# Patient Record
Sex: Female | Born: 1945
Health system: Southern US, Community
[De-identification: ages and names within clinical notes are randomized; demographics above are authoritative.]

## PROBLEM LIST (undated history)

## (undated) DIAGNOSIS — Z9889 Other specified postprocedural states: Secondary | ICD-10-CM

## (undated) DIAGNOSIS — I639 Cerebral infarction, unspecified: Secondary | ICD-10-CM

## (undated) DIAGNOSIS — F319 Bipolar disorder, unspecified: Secondary | ICD-10-CM

## (undated) DIAGNOSIS — F32A Depression, unspecified: Secondary | ICD-10-CM

## (undated) DIAGNOSIS — D649 Anemia, unspecified: Secondary | ICD-10-CM

## (undated) DIAGNOSIS — I809 Phlebitis and thrombophlebitis of unspecified site: Secondary | ICD-10-CM

## (undated) DIAGNOSIS — R112 Nausea with vomiting, unspecified: Secondary | ICD-10-CM

## (undated) DIAGNOSIS — J45909 Unspecified asthma, uncomplicated: Secondary | ICD-10-CM

## (undated) DIAGNOSIS — R42 Dizziness and giddiness: Secondary | ICD-10-CM

## (undated) DIAGNOSIS — K589 Irritable bowel syndrome without diarrhea: Secondary | ICD-10-CM

## (undated) DIAGNOSIS — K449 Diaphragmatic hernia without obstruction or gangrene: Secondary | ICD-10-CM

## (undated) DIAGNOSIS — N200 Calculus of kidney: Secondary | ICD-10-CM

## (undated) DIAGNOSIS — K5792 Diverticulitis of intestine, part unspecified, without perforation or abscess without bleeding: Secondary | ICD-10-CM

## (undated) DIAGNOSIS — M7120 Synovial cyst of popliteal space [Baker], unspecified knee: Secondary | ICD-10-CM

## (undated) DIAGNOSIS — R519 Headache, unspecified: Secondary | ICD-10-CM

## (undated) DIAGNOSIS — R0600 Dyspnea, unspecified: Secondary | ICD-10-CM

## (undated) DIAGNOSIS — F410 Panic disorder [episodic paroxysmal anxiety] without agoraphobia: Secondary | ICD-10-CM

## (undated) DIAGNOSIS — I2510 Atherosclerotic heart disease of native coronary artery without angina pectoris: Secondary | ICD-10-CM

## (undated) DIAGNOSIS — M1990 Unspecified osteoarthritis, unspecified site: Secondary | ICD-10-CM

## (undated) DIAGNOSIS — I10 Essential (primary) hypertension: Secondary | ICD-10-CM

## (undated) DIAGNOSIS — I2699 Other pulmonary embolism without acute cor pulmonale: Secondary | ICD-10-CM

## (undated) DIAGNOSIS — I700 Atherosclerosis of aorta: Secondary | ICD-10-CM

## (undated) DIAGNOSIS — I341 Nonrheumatic mitral (valve) prolapse: Secondary | ICD-10-CM

## (undated) DIAGNOSIS — T8859XA Other complications of anesthesia, initial encounter: Secondary | ICD-10-CM

## (undated) DIAGNOSIS — G4730 Sleep apnea, unspecified: Secondary | ICD-10-CM

## (undated) HISTORY — PX: TONSILLECTOMY: SUR1361

## (undated) HISTORY — PX: KNEE SURGERY: SHX244

## (undated) HISTORY — PX: TOE SURGERY: SHX1073

## (undated) HISTORY — PX: CHOLECYSTECTOMY: SHX55

## (undated) HISTORY — PX: BREAST REDUCTION SURGERY: SHX8

## (undated) HISTORY — PX: JOINT REPLACEMENT: SHX530

## (undated) HISTORY — PX: BACK SURGERY: SHX140

## (undated) HISTORY — PX: BUNIONECTOMY: SHX129

## (undated) HISTORY — PX: ACHILLES TENDON SURGERY: SHX542

## (undated) HISTORY — PX: ABDOMINAL HYSTERECTOMY: SHX81

## (undated) HISTORY — PX: REDUCTION MAMMAPLASTY: SUR839

---

## 1998-10-21 HISTORY — PX: EYE SURGERY: SHX253

## 2004-10-21 DIAGNOSIS — Z87442 Personal history of urinary calculi: Secondary | ICD-10-CM

## 2004-10-21 HISTORY — DX: Personal history of urinary calculi: Z87.442

## 2017-12-12 DIAGNOSIS — D649 Anemia, unspecified: Secondary | ICD-10-CM | POA: Insufficient documentation

## 2019-11-02 DIAGNOSIS — G609 Hereditary and idiopathic neuropathy, unspecified: Secondary | ICD-10-CM | POA: Insufficient documentation

## 2020-11-24 DIAGNOSIS — G4733 Obstructive sleep apnea (adult) (pediatric): Secondary | ICD-10-CM | POA: Diagnosis not present

## 2020-11-28 DIAGNOSIS — E785 Hyperlipidemia, unspecified: Secondary | ICD-10-CM | POA: Diagnosis not present

## 2020-11-28 DIAGNOSIS — E669 Obesity, unspecified: Secondary | ICD-10-CM | POA: Diagnosis not present

## 2020-11-28 DIAGNOSIS — I639 Cerebral infarction, unspecified: Secondary | ICD-10-CM | POA: Diagnosis not present

## 2020-11-28 DIAGNOSIS — G4733 Obstructive sleep apnea (adult) (pediatric): Secondary | ICD-10-CM | POA: Diagnosis not present

## 2020-12-04 DIAGNOSIS — Z20822 Contact with and (suspected) exposure to covid-19: Secondary | ICD-10-CM | POA: Diagnosis not present

## 2020-12-12 DIAGNOSIS — M4712 Other spondylosis with myelopathy, cervical region: Secondary | ICD-10-CM | POA: Diagnosis not present

## 2020-12-12 DIAGNOSIS — M542 Cervicalgia: Secondary | ICD-10-CM | POA: Diagnosis not present

## 2020-12-12 DIAGNOSIS — Z981 Arthrodesis status: Secondary | ICD-10-CM | POA: Diagnosis not present

## 2020-12-12 DIAGNOSIS — R030 Elevated blood-pressure reading, without diagnosis of hypertension: Secondary | ICD-10-CM | POA: Diagnosis not present

## 2020-12-12 DIAGNOSIS — Z6838 Body mass index (BMI) 38.0-38.9, adult: Secondary | ICD-10-CM | POA: Diagnosis not present

## 2020-12-19 DIAGNOSIS — R2689 Other abnormalities of gait and mobility: Secondary | ICD-10-CM | POA: Diagnosis not present

## 2020-12-19 DIAGNOSIS — M4802 Spinal stenosis, cervical region: Secondary | ICD-10-CM | POA: Diagnosis not present

## 2020-12-22 DIAGNOSIS — G4733 Obstructive sleep apnea (adult) (pediatric): Secondary | ICD-10-CM | POA: Diagnosis not present

## 2020-12-25 DIAGNOSIS — R2689 Other abnormalities of gait and mobility: Secondary | ICD-10-CM | POA: Diagnosis not present

## 2020-12-25 DIAGNOSIS — M4802 Spinal stenosis, cervical region: Secondary | ICD-10-CM | POA: Diagnosis not present

## 2020-12-28 DIAGNOSIS — R2689 Other abnormalities of gait and mobility: Secondary | ICD-10-CM | POA: Diagnosis not present

## 2020-12-28 DIAGNOSIS — M4802 Spinal stenosis, cervical region: Secondary | ICD-10-CM | POA: Diagnosis not present

## 2021-01-01 DIAGNOSIS — M4802 Spinal stenosis, cervical region: Secondary | ICD-10-CM | POA: Diagnosis not present

## 2021-01-01 DIAGNOSIS — R2689 Other abnormalities of gait and mobility: Secondary | ICD-10-CM | POA: Diagnosis not present

## 2021-01-03 DIAGNOSIS — R2689 Other abnormalities of gait and mobility: Secondary | ICD-10-CM | POA: Diagnosis not present

## 2021-01-03 DIAGNOSIS — M4802 Spinal stenosis, cervical region: Secondary | ICD-10-CM | POA: Diagnosis not present

## 2021-01-08 DIAGNOSIS — M4802 Spinal stenosis, cervical region: Secondary | ICD-10-CM | POA: Diagnosis not present

## 2021-01-08 DIAGNOSIS — R2689 Other abnormalities of gait and mobility: Secondary | ICD-10-CM | POA: Diagnosis not present

## 2021-01-09 DIAGNOSIS — M4802 Spinal stenosis, cervical region: Secondary | ICD-10-CM | POA: Diagnosis not present

## 2021-01-09 DIAGNOSIS — R2689 Other abnormalities of gait and mobility: Secondary | ICD-10-CM | POA: Diagnosis not present

## 2021-01-17 DIAGNOSIS — M542 Cervicalgia: Secondary | ICD-10-CM | POA: Diagnosis not present

## 2021-01-17 DIAGNOSIS — M4712 Other spondylosis with myelopathy, cervical region: Secondary | ICD-10-CM | POA: Diagnosis not present

## 2021-01-18 DIAGNOSIS — R2689 Other abnormalities of gait and mobility: Secondary | ICD-10-CM | POA: Diagnosis not present

## 2021-01-18 DIAGNOSIS — M4802 Spinal stenosis, cervical region: Secondary | ICD-10-CM | POA: Diagnosis not present

## 2021-01-19 DIAGNOSIS — M4802 Spinal stenosis, cervical region: Secondary | ICD-10-CM | POA: Diagnosis not present

## 2021-01-19 DIAGNOSIS — R2689 Other abnormalities of gait and mobility: Secondary | ICD-10-CM | POA: Diagnosis not present

## 2021-01-21 ENCOUNTER — Emergency Department (HOSPITAL_COMMUNITY)
Admission: EM | Admit: 2021-01-21 | Discharge: 2021-01-21 | Disposition: A | Discharge status: Home / Self Care | Payer: Medicare HMO | Attending: Emergency Medicine | Admitting: Emergency Medicine

## 2021-01-21 ENCOUNTER — Emergency Department (HOSPITAL_COMMUNITY): Payer: Medicare HMO

## 2021-01-21 ENCOUNTER — Other Ambulatory Visit: Payer: Self-pay

## 2021-01-21 ENCOUNTER — Encounter (HOSPITAL_COMMUNITY): Payer: Self-pay | Admitting: Emergency Medicine

## 2021-01-21 DIAGNOSIS — S0990XA Unspecified injury of head, initial encounter: Secondary | ICD-10-CM | POA: Diagnosis not present

## 2021-01-21 DIAGNOSIS — M546 Pain in thoracic spine: Secondary | ICD-10-CM | POA: Diagnosis not present

## 2021-01-21 DIAGNOSIS — G44319 Acute post-traumatic headache, not intractable: Secondary | ICD-10-CM

## 2021-01-21 DIAGNOSIS — M549 Dorsalgia, unspecified: Secondary | ICD-10-CM

## 2021-01-21 DIAGNOSIS — S52502A Unspecified fracture of the lower end of left radius, initial encounter for closed fracture: Secondary | ICD-10-CM | POA: Diagnosis not present

## 2021-01-21 DIAGNOSIS — W1839XA Other fall on same level, initial encounter: Secondary | ICD-10-CM | POA: Diagnosis not present

## 2021-01-21 DIAGNOSIS — M25522 Pain in left elbow: Secondary | ICD-10-CM | POA: Insufficient documentation

## 2021-01-21 DIAGNOSIS — Z043 Encounter for examination and observation following other accident: Secondary | ICD-10-CM | POA: Diagnosis not present

## 2021-01-21 DIAGNOSIS — M545 Low back pain, unspecified: Secondary | ICD-10-CM | POA: Diagnosis not present

## 2021-01-21 DIAGNOSIS — R519 Headache, unspecified: Secondary | ICD-10-CM | POA: Diagnosis not present

## 2021-01-21 DIAGNOSIS — J45909 Unspecified asthma, uncomplicated: Secondary | ICD-10-CM | POA: Diagnosis not present

## 2021-01-21 DIAGNOSIS — W19XXXA Unspecified fall, initial encounter: Secondary | ICD-10-CM

## 2021-01-21 HISTORY — DX: Synovial cyst of popliteal space (Baker), unspecified knee: M71.20

## 2021-01-21 HISTORY — DX: Diverticulitis of intestine, part unspecified, without perforation or abscess without bleeding: K57.92

## 2021-01-21 HISTORY — DX: Diaphragmatic hernia without obstruction or gangrene: K44.9

## 2021-01-21 HISTORY — DX: Other pulmonary embolism without acute cor pulmonale: I26.99

## 2021-01-21 HISTORY — DX: Panic disorder (episodic paroxysmal anxiety): F41.0

## 2021-01-21 HISTORY — DX: Nonrheumatic mitral (valve) prolapse: I34.1

## 2021-01-21 HISTORY — DX: Calculus of kidney: N20.0

## 2021-01-21 HISTORY — DX: Cerebral infarction, unspecified: I63.9

## 2021-01-21 HISTORY — DX: Dizziness and giddiness: R42

## 2021-01-21 HISTORY — DX: Phlebitis and thrombophlebitis of unspecified site: I80.9

## 2021-01-21 HISTORY — DX: Anemia, unspecified: D64.9

## 2021-01-21 HISTORY — DX: Unspecified asthma, uncomplicated: J45.909

## 2021-01-21 HISTORY — DX: Irritable bowel syndrome without diarrhea: K58.9

## 2021-01-21 MED ORDER — HYDROCODONE-ACETAMINOPHEN 5-325 MG PO TABS
1.0000 | ORAL_TABLET | Freq: Once | ORAL | Status: AC
  Administered 2021-01-21: 1 via ORAL
  Filled 2021-01-21: qty 1

## 2021-01-21 MED ORDER — HYDROCODONE-ACETAMINOPHEN 5-325 MG PO TABS: 1.0000 | ORAL_TABLET | ORAL | 0 refills | Status: DC | PRN

## 2021-01-21 NOTE — ED Notes (Signed)
Patient verbalized understanding of discharge instructions. Opportunity for questions and answers.  

## 2021-01-21 NOTE — ED Triage Notes (Signed)
Pt got her feet tangled in blanket around 4am and fell.  C/o pain to L hand, R lower back, and back of head.  Denies LOC.  No blood thinners.

## 2021-01-21 NOTE — ED Provider Notes (Signed)
Patient placed in Quick Look pathway, seen and evaluated   Chief Complaint: fall  HPI:   75 year old female presents after mechanical fall, he got tangled up in a blanket and she fell catching herself on her outstretched right hand and wrist where she is having pain.  Also reports pain in the right low back and pain over the back of her head and neck where she hit it on a table.  Aspirin but no other anticoagulants  ROS: + Headache, wrist pain, back pain  -Vomiting numbness, weakness  Physical Exam:   Gen: No distress  Neuro: Awake and Alert  Skin: Warm    Focused Exam: Tenderness to palpation over the back of the head with small healing, midline C-spine tenderness as well as some pain in the right low back musculature, pain and swelling noted over the left wrist   Initiation of care has begun. The patient has been counseled on the process, plan, and necessity for staying for the completion/evaluation, and the remainder of the medical screening examination MSE was initiated and I personally evaluated the patient and placed orders (if any) at  2:34 PM on January 21, 2021.  The patient appears stable so that the remainder of the MSE may be completed by another provider.   Dartha Lodge, PA-C 01/21/21 1442    Koleen Distance, MD 01/21/21 [phone]

## 2021-01-21 NOTE — ED Notes (Signed)
Patient transported to X-ray 

## 2021-01-25 DIAGNOSIS — R2689 Other abnormalities of gait and mobility: Secondary | ICD-10-CM | POA: Diagnosis not present

## 2021-01-25 DIAGNOSIS — M4802 Spinal stenosis, cervical region: Secondary | ICD-10-CM | POA: Diagnosis not present

## 2021-01-26 DIAGNOSIS — M4802 Spinal stenosis, cervical region: Secondary | ICD-10-CM | POA: Diagnosis not present

## 2021-01-26 DIAGNOSIS — R2689 Other abnormalities of gait and mobility: Secondary | ICD-10-CM | POA: Diagnosis not present

## 2021-01-30 DIAGNOSIS — M4712 Other spondylosis with myelopathy, cervical region: Secondary | ICD-10-CM | POA: Diagnosis not present

## 2021-01-30 DIAGNOSIS — R2689 Other abnormalities of gait and mobility: Secondary | ICD-10-CM | POA: Diagnosis not present

## 2021-01-30 DIAGNOSIS — M4802 Spinal stenosis, cervical region: Secondary | ICD-10-CM | POA: Diagnosis not present

## 2021-01-30 NOTE — ED Provider Notes (Signed)
[facility] EMERGENCY DEPARTMENT Provider Note   CSN: [id number] Arrival date & time: 01/21/21  1355     History Chief Complaint  Patient presents with  . Fall    Courtney [name] is a 75 y.o. female.  HPI     75yo female with history below presents with concern for fall with left wrist/hand pain and back pain.  She was tangled up in a blanket last night causing her to fall.  She hit the back of her head and reports headache. No LOC.  Pain is moderate, severe with movements. She is on aspirin but no other anticoagulation. Denies other recent symptoms or concerns. No n/v/numbness/weakness.       Past Medical History:  Diagnosis Date  . Anemia   . Asthma   . Baker's cyst of knee   . Diverticulitis   . Hiatal hernia   . Kidney stone   . Mitral valve prolapse   . Panic attack   . Phlebitis   . Pulmonary embolus (HCC)   . Spastic colon   . Stroke (HCC)   . Vertigo     There are no problems to display for this patient.   Past Surgical History:  Procedure Laterality Date  . ABDOMINAL HYSTERECTOMY    . BACK SURGERY    . BREAST REDUCTION SURGERY    . CHOLECYSTECTOMY    . KNEE SURGERY    . KNEE SURGERY    . TOE SURGERY    . TONSILLECTOMY       OB History   No obstetric history on file.     No family history on file.  Social History   Tobacco Use  . Smoking status: Never Smoker  . Smokeless tobacco: Never Used  Substance Use Topics  . Alcohol use: Not Currently  . Drug use: Not Currently    Home Medications Prior to Admission medications   Medication Sig Start Date End Date Taking? Authorizing Provider  HYDROcodone-acetaminophen (NORCO/VICODIN) 5-325 MG tablet Take 1 tablet by mouth every 4 (four) hours as needed. 01/21/21  Yes Alvira Monday, MD    Allergies    Patient has no allergy information on record.  Review of Systems   Review of Systems  Constitutional: Negative for fever.  Respiratory: Negative for cough and  shortness of breath.   Cardiovascular: Negative for chest pain.  Gastrointestinal: Negative for nausea and vomiting.  Genitourinary: Negative for difficulty urinating.  Musculoskeletal: Positive for arthralgias and back pain.  Skin: Negative for wound.  Neurological: Positive for headaches. Negative for syncope, weakness and numbness.    Physical Exam Updated Vital Signs BP 133/67   Pulse 66   Temp 98.6 F (37 C)   Resp 17   SpO2 98%   Physical Exam Vitals and nursing note reviewed.  Constitutional:      General: She is not in acute distress.    Appearance: Normal appearance. She is not ill-appearing, toxic-appearing or diaphoretic.  HENT:     Head: Normocephalic.  Eyes:     Conjunctiva/sclera: Conjunctivae normal.  Cardiovascular:     Rate and Rhythm: Normal rate and regular rhythm.     Pulses: Normal pulses.  Pulmonary:     Effort: Pulmonary effort is normal. No respiratory distress.  Musculoskeletal:        General: No deformity or signs of injury.     Cervical back: No rigidity.     Comments: Tenderness C, T, L spine Tenderness distal radius Normal pulses, opponens/finger  abduction, sensation Elbow tenderness  Skin:    General: Skin is warm and dry.     Coloration: Skin is not jaundiced or pale.  Neurological:     General: No focal deficit present.     Mental Status: She is alert and oriented to person, place, and time.     ED Results / Procedures / Treatments   Labs (all labs ordered are listed, but only abnormal results are displayed) Labs Reviewed - No data to display  EKG None  Radiology No results found.  Procedures Procedures   Medications Ordered in ED Medications  HYDROcodone-acetaminophen (NORCO/VICODIN) 5-325 MG per tablet 1 tablet (1 tablet Oral Given 01/21/21 2201)    ED Course  I have reviewed the triage vital signs and the nursing notes.  Pertinent labs & imaging results that were available during my care of the patient were  reviewed by me and considered in my medical decision making (see chart for details).    MDM Rules/Calculators/A&P                          75yo female with history below presents with concern for fall with left wrist/hand pain and back pain.  Reports mechanical fall with no other acute medical concerns.    CT head and CSpine without acute injuries.  XR T, L spine, elbow without acute abnormalities.  XR wrist shows fracture of distal left radius.  Reports she is claustrophobic and cannot tolerate a splint and will cut it off. Discussed this situation with Dr. Eulah Pont and agreed to use velcro splint. Discussed pain control and reviewed in [place] drug database. Patient discharged in stable condition with understanding of reasons to return.    Final Clinical Impression(s) / ED Diagnoses Final diagnoses:  Fall, initial encounter  Acute post-traumatic headache, not intractable  Acute midline back pain, unspecified back location  Closed fracture of distal end of left radius, unspecified fracture morphology, initial encounter    Rx / DC Orders ED Discharge Orders         Ordered    HYDROcodone-acetaminophen (NORCO/VICODIN) 5-325 MG tablet  Every 4 hours PRN        01/21/21 2230           Alvira Monday, MD 01/30/21 [id number]

## 2021-01-31 DIAGNOSIS — S52552D Other extraarticular fracture of lower end of left radius, subsequent encounter for closed fracture with routine healing: Secondary | ICD-10-CM | POA: Diagnosis not present

## 2021-02-01 DIAGNOSIS — M4802 Spinal stenosis, cervical region: Secondary | ICD-10-CM | POA: Diagnosis not present

## 2021-02-01 DIAGNOSIS — R2689 Other abnormalities of gait and mobility: Secondary | ICD-10-CM | POA: Diagnosis not present

## 2021-02-06 DIAGNOSIS — R2689 Other abnormalities of gait and mobility: Secondary | ICD-10-CM | POA: Diagnosis not present

## 2021-02-06 DIAGNOSIS — M4802 Spinal stenosis, cervical region: Secondary | ICD-10-CM | POA: Diagnosis not present

## 2021-02-09 DIAGNOSIS — R2689 Other abnormalities of gait and mobility: Secondary | ICD-10-CM | POA: Diagnosis not present

## 2021-02-09 DIAGNOSIS — M4802 Spinal stenosis, cervical region: Secondary | ICD-10-CM | POA: Diagnosis not present

## 2021-02-12 DIAGNOSIS — R2689 Other abnormalities of gait and mobility: Secondary | ICD-10-CM | POA: Diagnosis not present

## 2021-02-12 DIAGNOSIS — M4802 Spinal stenosis, cervical region: Secondary | ICD-10-CM | POA: Diagnosis not present

## 2021-02-15 ENCOUNTER — Other Ambulatory Visit: Payer: Self-pay | Admitting: Neurosurgery

## 2021-02-18 ENCOUNTER — Emergency Department (HOSPITAL_COMMUNITY): Payer: Medicare HMO

## 2021-02-18 ENCOUNTER — Encounter (HOSPITAL_COMMUNITY): Payer: Self-pay

## 2021-02-18 ENCOUNTER — Other Ambulatory Visit: Payer: Self-pay

## 2021-02-18 ENCOUNTER — Emergency Department (HOSPITAL_COMMUNITY)
Admission: EM | Admit: 2021-02-18 | Discharge: 2021-02-18 | Disposition: A | Discharge status: Home / Self Care | Payer: Medicare HMO | Attending: Emergency Medicine | Admitting: Emergency Medicine

## 2021-02-18 DIAGNOSIS — S0990XA Unspecified injury of head, initial encounter: Secondary | ICD-10-CM | POA: Diagnosis not present

## 2021-02-18 DIAGNOSIS — Z043 Encounter for examination and observation following other accident: Secondary | ICD-10-CM | POA: Diagnosis not present

## 2021-02-18 DIAGNOSIS — W01198A Fall on same level from slipping, tripping and stumbling with subsequent striking against other object, initial encounter: Secondary | ICD-10-CM | POA: Insufficient documentation

## 2021-02-18 DIAGNOSIS — J45909 Unspecified asthma, uncomplicated: Secondary | ICD-10-CM | POA: Diagnosis not present

## 2021-02-18 DIAGNOSIS — R9431 Abnormal electrocardiogram [ECG] [EKG]: Secondary | ICD-10-CM | POA: Diagnosis not present

## 2021-02-18 DIAGNOSIS — Y92002 Bathroom of unspecified non-institutional (private) residence single-family (private) house as the place of occurrence of the external cause: Secondary | ICD-10-CM | POA: Insufficient documentation

## 2021-02-18 DIAGNOSIS — I959 Hypotension, unspecified: Secondary | ICD-10-CM | POA: Diagnosis not present

## 2021-02-18 DIAGNOSIS — W19XXXA Unspecified fall, initial encounter: Secondary | ICD-10-CM | POA: Diagnosis not present

## 2021-02-18 DIAGNOSIS — M47812 Spondylosis without myelopathy or radiculopathy, cervical region: Secondary | ICD-10-CM | POA: Diagnosis not present

## 2021-02-18 DIAGNOSIS — G4489 Other headache syndrome: Secondary | ICD-10-CM | POA: Diagnosis not present

## 2021-02-18 DIAGNOSIS — M542 Cervicalgia: Secondary | ICD-10-CM | POA: Diagnosis not present

## 2021-02-18 MED ORDER — ACETAMINOPHEN 325 MG PO TABS
650.0000 mg | ORAL_TABLET | Freq: Once | ORAL | Status: AC
  Administered 2021-02-18: 650 mg via ORAL
  Filled 2021-02-18: qty 2

## 2021-02-18 NOTE — ED Notes (Signed)
Patient transported to CT 

## 2021-02-18 NOTE — ED Triage Notes (Signed)
States has neck surgery scheduled for June 8, neck was not hurting but now it is since fall this morning. Not sure if LOC.  States her knees gave out, EMS states 1in hematoma to back of head, no lacerations, c./o head and neck pain, mae

## 2021-02-18 NOTE — Discharge Instructions (Addendum)
Please return for any problem.  °

## 2021-02-18 NOTE — ED Provider Notes (Signed)
[facility] EMERGENCY DEPARTMENT Provider Note   CSN: [id number] Arrival date & time: 02/18/21  0920     History Chief Complaint  Patient presents with  . Fall  . Headache  . Neck Injury    States has neck surgery scheduled for June 8, neck was not hurting but now it is since fall this morning. Not sure if LOC.  States her knees gave out EMS states has about one inch hematoma to back of head    Courtney [name] is a 75 y.o. female.  75 year old female with prior medical history as detailed below presents for evaluation.  Patient reports that she had a mechanical fall in her bathroom this morning.  She was bending over to take care of her cats.  She lost her balance and fell forward.  She struck her head against the floor.  She did not pass out.  She complains of chronic neck and back pain.  She denies new acute neck pain.  She denies other injury related to her fall.  She was on the floor for roughly 5 to 10 minutes until family helped her up.  She denies other injury.  She was ambulatory after the fall.  The history is provided by the patient and medical records.  Head Injury Location:  Frontal Time since incident:  1 hour Mechanism of injury: fall   Fall:    Point of impact:  Head   Entrapped after fall: no   Pain details:    Quality:  Aching   Severity:  Mild   Progression:  Waxing and waning Chronicity:  New Relieved by:  Nothing Worsened by:  Nothing      Past Medical History:  Diagnosis Date  . Anemia   . Asthma   . Baker's cyst of knee   . Diverticulitis   . Hiatal hernia   . Kidney stone   . Mitral valve prolapse   . Panic attack   . Phlebitis   . Pulmonary embolus (HCC)   . Spastic colon   . Stroke (HCC)   . Vertigo     There are no problems to display for this patient.   Past Surgical History:  Procedure Laterality Date  . ABDOMINAL HYSTERECTOMY    . BACK SURGERY    . BREAST REDUCTION SURGERY    . CHOLECYSTECTOMY    . KNEE  SURGERY    . KNEE SURGERY    . TOE SURGERY    . TONSILLECTOMY       OB History   No obstetric history on file.     History reviewed. No pertinent family history.  Social History   Tobacco Use  . Smoking status: Never Smoker  . Smokeless tobacco: Never Used  Substance Use Topics  . Alcohol use: Not Currently  . Drug use: Not Currently    Home Medications Prior to Admission medications   Medication Sig Start Date End Date Taking? Authorizing Provider  HYDROcodone-acetaminophen (NORCO/VICODIN) 5-325 MG tablet Take 1 tablet by mouth every 4 (four) hours as needed. 01/21/21   Alvira Monday, MD    Allergies    Meloxicam, Phentermine, Sulfa antibiotics, Tetanus-diphtheria toxoids td, and Zithromax [azithromycin]  Review of Systems   Review of Systems  All other systems reviewed and are negative.   Physical Exam Updated Vital Signs BP (!) 185/100 (BP Location: Right Arm)   Pulse 73 Comment: NSR  Temp 97.8 F (36.6 C) (Oral)   Resp 18   Ht 5'  2" (1.575 m)   Wt 99.8 kg   SpO2 100%   BMI 40.24 kg/m   Physical Exam Vitals and nursing note reviewed.  Constitutional:      General: She is not in acute distress.    Appearance: She is well-developed.  HENT:     Head: Normocephalic and atraumatic.  Eyes:     General: No visual field deficit.    Conjunctiva/sclera: Conjunctivae normal.     Pupils: Pupils are equal, round, and reactive to light.  Cardiovascular:     Rate and Rhythm: Normal rate and regular rhythm.     Heart sounds: Normal heart sounds.  Pulmonary:     Effort: Pulmonary effort is normal. No respiratory distress.     Breath sounds: Normal breath sounds.  Abdominal:     General: There is no distension.     Palpations: Abdomen is soft.     Tenderness: There is no abdominal tenderness.  Musculoskeletal:        General: No deformity. Normal range of motion.     Cervical back: Normal range of motion and neck supple.  Skin:    General: Skin is warm and  dry.  Neurological:     Mental Status: She is alert and oriented to person, place, and time. Mental status is at baseline.     GCS: GCS eye subscore is 4. GCS verbal subscore is 5. GCS motor subscore is 6.     Cranial Nerves: No cranial nerve deficit, dysarthria or facial asymmetry.     Sensory: No sensory deficit.     Motor: No weakness.     Coordination: Romberg sign negative. Coordination normal.     ED Results / Procedures / Treatments   Labs (all labs ordered are listed, but only abnormal results are displayed) Labs Reviewed - No data to display  EKG EKG Interpretation  Date/Time:  Sunday Feb 18 2021 09:31:45 EDT Ventricular Rate:  72 PR Interval:  166 QRS Duration: 105 QT Interval:  440 QTC Calculation: 482 R Axis:   45 Text Interpretation: Sinus rhythm Abnormal R-wave progression, early transition Confirmed by Kristine Royal [phone]) on 02/18/2021 9:34:47 AM   Radiology CT Head Wo Contrast  Result Date: 02/18/2021 CLINICAL DATA:  Fall EXAM: CT HEAD WITHOUT CONTRAST TECHNIQUE: Contiguous axial images were obtained from the base of the skull through the vertex without intravenous contrast. COMPARISON:  01/21/2021 FINDINGS: Brain: There is no acute intracranial hemorrhage, mass effect, or edema. No new loss of gray differentiation. Patchy and confluent areas of hypoattenuation in the supratentorial white matter likely reflects stable chronic microvascular ischemic changes. Chronic infarct of the right corona radiata/lentiform nucleus. There is no extra-axial fluid collection. Prominence of the ventricles and sulci reflects stable parenchymal volume loss. Vascular: There is atherosclerotic calcification at the skull base. Skull: Calvarium is unremarkable. Sinuses/Orbits: No acute finding. Other: None. IMPRESSION: No evidence of acute intracranial injury. No significant change since recent prior study. Electronically Signed   By: Guadlupe Spanish M.D.   On: 02/18/2021 10:48   CT  Cervical Spine Wo Contrast  Result Date: 02/18/2021 CLINICAL DATA:  Fall EXAM: CT CERVICAL SPINE WITHOUT CONTRAST TECHNIQUE: Multidetector CT imaging of the cervical spine was performed without intravenous contrast. Multiplanar CT image reconstructions were also generated. COMPARISON:  01/21/2021 FINDINGS: Alignment: Stable. Skull base and vertebrae: Stable vertebral body heights. No acute cervical spine fracture. Soft tissues and spinal canal: No prevertebral fluid or swelling. No visible canal hematoma. Disc levels: Multilevel degenerative changes are  present including disc space narrowing, endplate osteophytes, and facet and uncovertebral hypertrophy. Stable appearance compared to recent prior study. Changes remain greatest at C3-C4, C4-C5, and C5-C6. Upper chest: No acute abnormality. Other: None. IMPRESSION: No acute cervical spine fracture. Electronically Signed   By: Guadlupe Spanish M.D.   On: 02/18/2021 10:51    Procedures Procedures   Medications Ordered in ED Medications  acetaminophen (TYLENOL) tablet 650 mg (650 mg Oral Given 02/18/21 0943)    ED Course  I have reviewed the triage vital signs and the nursing notes.  Pertinent labs & imaging results that were available during my care of the patient were reviewed by me and considered in my medical decision making (see chart for details).    MDM Rules/Calculators/A&P                          MDM  MSE complete  CARMINE YOUNGBERG was evaluated in Emergency Department on 02/18/2021 for the symptoms described in the history of present illness. She was evaluated in the context of the global COVID-19 pandemic, which necessitated consideration that the patient might be at risk for infection with the SARS-CoV-2 virus that causes COVID-19. Institutional protocols and algorithms that pertain to the evaluation of patients at risk for COVID-19 are in a state of rapid change based on information released by regulatory bodies including the CDC and  federal and state organizations. These policies and algorithms were followed during the patient's care in the ED.  Patient is presenting for evaluation following reported fall at home.  She did strike her head.  She did not experience LOC.  No other injury reported.  No other injury found on exam.  CT imaging is without significant acute traumatic injury.  Patient feels improved after administration of Tylenol.  She now desires discharge home.  She does use Tylenol and Ultram at home as needed for pain.  She and her husband at bedside understand need for close follow-up.  Strict return precautions given and understood.  Final Clinical Impression(s) / ED Diagnoses Final diagnoses:  Fall, initial encounter  Injury of head, initial encounter    Rx / DC Orders ED Discharge Orders    None       Wynetta Fines, MD 02/18/21 1101

## 2021-02-20 DIAGNOSIS — S52552D Other extraarticular fracture of lower end of left radius, subsequent encounter for closed fracture with routine healing: Secondary | ICD-10-CM | POA: Diagnosis not present

## 2021-02-20 DIAGNOSIS — M25532 Pain in left wrist: Secondary | ICD-10-CM | POA: Diagnosis not present

## 2021-02-22 ENCOUNTER — Other Ambulatory Visit: Payer: Self-pay | Admitting: Internal Medicine

## 2021-02-22 DIAGNOSIS — F411 Generalized anxiety disorder: Secondary | ICD-10-CM | POA: Diagnosis not present

## 2021-02-22 DIAGNOSIS — M859 Disorder of bone density and structure, unspecified: Secondary | ICD-10-CM | POA: Diagnosis not present

## 2021-02-22 DIAGNOSIS — M1990 Unspecified osteoarthritis, unspecified site: Secondary | ICD-10-CM | POA: Diagnosis not present

## 2021-02-22 DIAGNOSIS — I639 Cerebral infarction, unspecified: Secondary | ICD-10-CM | POA: Diagnosis not present

## 2021-02-22 DIAGNOSIS — M8580 Other specified disorders of bone density and structure, unspecified site: Secondary | ICD-10-CM

## 2021-02-22 DIAGNOSIS — G629 Polyneuropathy, unspecified: Secondary | ICD-10-CM | POA: Diagnosis not present

## 2021-02-22 DIAGNOSIS — E785 Hyperlipidemia, unspecified: Secondary | ICD-10-CM | POA: Diagnosis not present

## 2021-02-22 DIAGNOSIS — Z1231 Encounter for screening mammogram for malignant neoplasm of breast: Secondary | ICD-10-CM

## 2021-02-22 DIAGNOSIS — I10 Essential (primary) hypertension: Secondary | ICD-10-CM | POA: Diagnosis not present

## 2021-02-22 DIAGNOSIS — K219 Gastro-esophageal reflux disease without esophagitis: Secondary | ICD-10-CM | POA: Diagnosis not present

## 2021-02-22 DIAGNOSIS — E039 Hypothyroidism, unspecified: Secondary | ICD-10-CM | POA: Diagnosis not present

## 2021-02-22 DIAGNOSIS — F331 Major depressive disorder, recurrent, moderate: Secondary | ICD-10-CM | POA: Diagnosis not present

## 2021-02-22 DIAGNOSIS — R2689 Other abnormalities of gait and mobility: Secondary | ICD-10-CM | POA: Diagnosis not present

## 2021-02-22 DIAGNOSIS — M4802 Spinal stenosis, cervical region: Secondary | ICD-10-CM | POA: Diagnosis not present

## 2021-02-28 DIAGNOSIS — M4802 Spinal stenosis, cervical region: Secondary | ICD-10-CM | POA: Diagnosis not present

## 2021-02-28 DIAGNOSIS — G4733 Obstructive sleep apnea (adult) (pediatric): Secondary | ICD-10-CM | POA: Diagnosis not present

## 2021-02-28 DIAGNOSIS — R2689 Other abnormalities of gait and mobility: Secondary | ICD-10-CM | POA: Diagnosis not present

## 2021-03-02 DIAGNOSIS — R110 Nausea: Secondary | ICD-10-CM | POA: Diagnosis not present

## 2021-03-02 DIAGNOSIS — R519 Headache, unspecified: Secondary | ICD-10-CM | POA: Diagnosis not present

## 2021-03-13 DIAGNOSIS — Z1212 Encounter for screening for malignant neoplasm of rectum: Secondary | ICD-10-CM | POA: Diagnosis not present

## 2021-03-13 DIAGNOSIS — Z1211 Encounter for screening for malignant neoplasm of colon: Secondary | ICD-10-CM | POA: Diagnosis not present

## 2021-03-20 DIAGNOSIS — S52552D Other extraarticular fracture of lower end of left radius, subsequent encounter for closed fracture with routine healing: Secondary | ICD-10-CM | POA: Diagnosis not present

## 2021-03-21 LAB — COLOGUARD: COLOGUARD: POSITIVE — AB

## 2021-03-26 ENCOUNTER — Encounter (HOSPITAL_COMMUNITY): Payer: Self-pay

## 2021-03-26 ENCOUNTER — Encounter (HOSPITAL_COMMUNITY)
Admission: RE | Admit: 2021-03-26 | Discharge: 2021-03-26 | Disposition: A | Discharge status: Home / Self Care | Payer: Medicare HMO | Source: Ambulatory Visit | Attending: Neurosurgery | Admitting: Neurosurgery

## 2021-03-26 ENCOUNTER — Other Ambulatory Visit: Payer: Self-pay

## 2021-03-26 DIAGNOSIS — E039 Hypothyroidism, unspecified: Secondary | ICD-10-CM | POA: Diagnosis present

## 2021-03-26 DIAGNOSIS — M4722 Other spondylosis with radiculopathy, cervical region: Secondary | ICD-10-CM | POA: Diagnosis present

## 2021-03-26 DIAGNOSIS — K5900 Constipation, unspecified: Secondary | ICD-10-CM | POA: Diagnosis not present

## 2021-03-26 DIAGNOSIS — M4802 Spinal stenosis, cervical region: Secondary | ICD-10-CM | POA: Diagnosis present

## 2021-03-26 DIAGNOSIS — Z981 Arthrodesis status: Secondary | ICD-10-CM | POA: Diagnosis not present

## 2021-03-26 DIAGNOSIS — R2681 Unsteadiness on feet: Secondary | ICD-10-CM | POA: Diagnosis present

## 2021-03-26 DIAGNOSIS — M4712 Other spondylosis with myelopathy, cervical region: Secondary | ICD-10-CM | POA: Diagnosis present

## 2021-03-26 DIAGNOSIS — M4322 Fusion of spine, cervical region: Secondary | ICD-10-CM | POA: Diagnosis not present

## 2021-03-26 DIAGNOSIS — I10 Essential (primary) hypertension: Secondary | ICD-10-CM | POA: Diagnosis present

## 2021-03-26 DIAGNOSIS — F319 Bipolar disorder, unspecified: Secondary | ICD-10-CM | POA: Diagnosis present

## 2021-03-26 DIAGNOSIS — J986 Disorders of diaphragm: Secondary | ICD-10-CM | POA: Diagnosis present

## 2021-03-26 DIAGNOSIS — R1313 Dysphagia, pharyngeal phase: Secondary | ICD-10-CM | POA: Diagnosis present

## 2021-03-26 DIAGNOSIS — R062 Wheezing: Secondary | ICD-10-CM | POA: Diagnosis not present

## 2021-03-26 DIAGNOSIS — Z743 Need for continuous supervision: Secondary | ICD-10-CM | POA: Diagnosis not present

## 2021-03-26 DIAGNOSIS — Z01812 Encounter for preprocedural laboratory examination: Secondary | ICD-10-CM | POA: Insufficient documentation

## 2021-03-26 DIAGNOSIS — I517 Cardiomegaly: Secondary | ICD-10-CM | POA: Diagnosis not present

## 2021-03-26 DIAGNOSIS — M19032 Primary osteoarthritis, left wrist: Secondary | ICD-10-CM | POA: Diagnosis present

## 2021-03-26 DIAGNOSIS — I341 Nonrheumatic mitral (valve) prolapse: Secondary | ICD-10-CM | POA: Diagnosis present

## 2021-03-26 DIAGNOSIS — R0603 Acute respiratory distress: Secondary | ICD-10-CM | POA: Diagnosis not present

## 2021-03-26 DIAGNOSIS — R710 Precipitous drop in hematocrit: Secondary | ICD-10-CM | POA: Diagnosis not present

## 2021-03-26 DIAGNOSIS — Z6841 Body Mass Index (BMI) 40.0 and over, adult: Secondary | ICD-10-CM | POA: Diagnosis not present

## 2021-03-26 DIAGNOSIS — E871 Hypo-osmolality and hyponatremia: Secondary | ICD-10-CM | POA: Diagnosis present

## 2021-03-26 DIAGNOSIS — R0602 Shortness of breath: Secondary | ICD-10-CM | POA: Diagnosis not present

## 2021-03-26 DIAGNOSIS — J449 Chronic obstructive pulmonary disease, unspecified: Secondary | ICD-10-CM | POA: Diagnosis present

## 2021-03-26 DIAGNOSIS — R531 Weakness: Secondary | ICD-10-CM | POA: Diagnosis not present

## 2021-03-26 DIAGNOSIS — R339 Retention of urine, unspecified: Secondary | ICD-10-CM | POA: Diagnosis not present

## 2021-03-26 DIAGNOSIS — E669 Obesity, unspecified: Secondary | ICD-10-CM | POA: Diagnosis present

## 2021-03-26 DIAGNOSIS — R061 Stridor: Secondary | ICD-10-CM | POA: Diagnosis not present

## 2021-03-26 DIAGNOSIS — Z20822 Contact with and (suspected) exposure to covid-19: Secondary | ICD-10-CM | POA: Diagnosis present

## 2021-03-26 DIAGNOSIS — G4733 Obstructive sleep apnea (adult) (pediatric): Secondary | ICD-10-CM | POA: Diagnosis present

## 2021-03-26 DIAGNOSIS — E785 Hyperlipidemia, unspecified: Secondary | ICD-10-CM | POA: Diagnosis present

## 2021-03-26 DIAGNOSIS — J690 Pneumonitis due to inhalation of food and vomit: Secondary | ICD-10-CM | POA: Diagnosis not present

## 2021-03-26 DIAGNOSIS — R451 Restlessness and agitation: Secondary | ICD-10-CM | POA: Diagnosis not present

## 2021-03-26 DIAGNOSIS — I69398 Other sequelae of cerebral infarction: Secondary | ICD-10-CM | POA: Diagnosis not present

## 2021-03-26 DIAGNOSIS — R221 Localized swelling, mass and lump, neck: Secondary | ICD-10-CM | POA: Diagnosis not present

## 2021-03-26 HISTORY — DX: Unspecified osteoarthritis, unspecified site: M19.90

## 2021-03-26 HISTORY — DX: Nausea with vomiting, unspecified: R11.2

## 2021-03-26 HISTORY — DX: Sleep apnea, unspecified: G47.30

## 2021-03-26 HISTORY — DX: Other complications of anesthesia, initial encounter: T88.59XA

## 2021-03-26 HISTORY — DX: Headache, unspecified: R51.9

## 2021-03-26 HISTORY — DX: Bipolar disorder, unspecified: F31.9

## 2021-03-26 HISTORY — DX: Other specified postprocedural states: Z98.890

## 2021-03-26 HISTORY — DX: Essential (primary) hypertension: I10

## 2021-03-26 HISTORY — DX: Dyspnea, unspecified: R06.00

## 2021-03-26 LAB — TYPE AND SCREEN
ABO/RH(D): O POS
Antibody Screen: NEGATIVE

## 2021-03-26 LAB — CBC
HCT: 42.5 % (ref 36.0–46.0)
Hemoglobin: 13.1 g/dL (ref 12.0–15.0)
MCH: 28.7 pg (ref 26.0–34.0)
MCHC: 30.8 g/dL (ref 30.0–36.0)
MCV: 93.2 fL (ref 80.0–100.0)
Platelets: 244 10*3/uL (ref 150–400)
RBC: 4.56 MIL/uL (ref 3.87–5.11)
RDW: 12.8 % (ref 11.5–15.5)
WBC: 6.9 10*3/uL (ref 4.0–10.5)
nRBC: 0 % (ref 0.0–0.2)

## 2021-03-26 LAB — BASIC METABOLIC PANEL
Anion gap: 10 (ref 5–15)
BUN: 10 mg/dL (ref 8–23)
CO2: 25 mmol/L (ref 22–32)
Calcium: 9.2 mg/dL (ref 8.9–10.3)
Chloride: 99 mmol/L (ref 98–111)
Creatinine, Ser: 0.8 mg/dL (ref 0.44–1.00)
GFR, Estimated: >60 mL/min (ref >60)
Glucose, Bld: 93 mg/dL (ref 70–99)
Potassium: 4.4 mmol/L (ref 3.5–5.1)
Sodium: 134 mmol/L — ABNORMAL LOW (ref 135–145)

## 2021-03-26 NOTE — Pre-Procedure Instructions (Signed)
Surgical Instructions    Your procedure is scheduled on Wednesday 03/28/21.  Report to [facility] Main Entrance "A" at 06:30 A.M., then check in with the Admitting office.  Call this number if you have problems the morning of surgery:  [phone]   If you have any questions prior to your surgery date call [phone]: Open Monday-Friday 8am-4pm    Remember:  Do not eat or drink after midnight the night before your surgery     Take these medicines the morning of surgery with A SIP OF WATER   albuterol (VENTOLIN HFA) 108 (90 Base)- If needed  atorvastatin (LIPITOR)  buPROPion (WELLBUTRIN XL)   docusate sodium (COLACE)  escitalopram (LEXAPRO)  FLOVENT HFA 44 MCG/ACT   gabapentin (NEURONTIN)  levothyroxine (SYNTHROID)   LORazepam (ATIVAN)  montelukast (SINGULAIR)  pantoprazole (PROTONIX)  promethazine (PHENERGAN)- If needed  solifenacin (VESICARE)  verapamil (CALAN-SR)    As of today, STOP taking any Aspirin (unless otherwise instructed by your surgeon) Aleve, Naproxen, Ibuprofen, Motrin, Advil, Goody's, BC's, all herbal medications, fish oil, and all vitamins.          Do not wear jewelry or makeup Do not wear lotions, powders, perfumes/colognes, or deodorant. Do not shave 48 hours prior to surgery.  Men may shave face and neck. Do not bring valuables to the hospital. DO Not wear nail polish, gel polish, artificial nails, or any other type of covering on  natural nails including finger and toenails. If patients have artificial nails, gel coating, etc. that need to be removed by a nail salon please have this removed prior to surgery or surgery may need to be canceled/delayed if the surgeon/ anesthesia feels like the patient is unable to be adequately monitored.             [facility] is not responsible for any belongings or valuables.  Do NOT Smoke (Tobacco/Vaping) or drink Alcohol 24 hours prior to your procedure If you use a CPAP at night, you may bring all equipment for  your overnight stay.   Contacts, glasses, dentures or bridgework may not be worn into surgery, please bring cases for these belongings   For patients admitted to the hospital, discharge time will be determined by your treatment team.   Patients discharged the day of surgery will not be allowed to drive home, and someone needs to stay with them for 24 hours.    Special instructions:    Oral Hygiene is also important to reduce your risk of infection.  Remember - BRUSH YOUR TEETH THE MORNING OF SURGERY WITH YOUR REGULAR TOOTHPASTE   [place]- Preparing For Surgery  Before surgery, you can play an important role. Because skin is not sterile, your skin needs to be as free of germs as possible. You can reduce the number of germs on your skin by washing with CHG (chlorahexidine gluconate) Soap before surgery.  CHG is an antiseptic cleaner which kills germs and bonds with the skin to continue killing germs even after washing.     Please do not use if you have an allergy to CHG or antibacterial soaps. If your skin becomes reddened/irritated stop using the CHG.  Do not shave (including legs and underarms) for at least 48 hours prior to first CHG shower. It is OK to shave your face.  Please follow these instructions carefully.    1.  Shower the NIGHT BEFORE SURGERY and the MORNING OF SURGERY with CHG Soap.   If you chose to wash your hair,  wash your hair first as usual with your normal shampoo. After you shampoo, rinse your hair and body thoroughly to remove the shampoo.  Then [employer] and genitals (private parts) with your normal soap and rinse thoroughly to remove soap.  2. After that Use CHG Soap as you would any other liquid soap. You can apply CHG directly to the skin and wash gently with a scrungie or a clean washcloth.   3. Apply the CHG Soap to your body ONLY FROM THE NECK DOWN.  Do not use on open wounds or open sores. Avoid contact with your eyes, ears, mouth and genitals (private  parts). Wash Face and genitals (private parts)  with your normal soap.   4. Wash thoroughly, paying special attention to the area where your surgery will be performed.  5. Thoroughly rinse your body with warm water from the neck down.  6. DO NOT shower/wash with your normal soap after using and rinsing off the CHG Soap.  7. Pat yourself dry with a CLEAN TOWEL.  8. Wear CLEAN PAJAMAS to bed the night before surgery  9. Place CLEAN SHEETS on your bed the night before your surgery  10. DO NOT SLEEP WITH PETS.   Day of Surgery:  Take a shower with CHG soap. Wear Clean/Comfortable clothing the morning of surgery Do not apply any deodorants/lotions.   Remember to brush your teeth WITH YOUR REGULAR TOOTHPASTE.   Please read over the following fact sheets that you were given.

## 2021-03-26 NOTE — Progress Notes (Addendum)
PCP - Dr. Tyson Dense Cardiologist - denies  PPM/ICD - n/a Device Orders - n/a Rep Notified - n/a  Chest x-ray - n/a EKG - 02/18/21 Stress Test - denies ECHO-denies Cardiac Cath - denies  Sleep Study - Yes. Several years ago in [place], [place]. Patient cannot remember name of provider CPAP - Yes. Wears nightly.   Fasting Blood Sugar - n/a  Aspirin Instructions: Patient states she stopped her aspirin as instructed by her doctor 5 days ago.  COVID TEST- 03/26/21. Pending   Anesthesia review: No  Patient denies shortness of breath, fever, cough and chest pain at PAT appointment   All instructions explained to the patient, with a verbal understanding of the material. Patient agrees to go over the instructions while at home for a better understanding. Patient also instructed to self quarantine after being tested for COVID-19. The opportunity to ask questions was provided.

## 2021-03-27 LAB — SURGICAL PCR SCREEN
MRSA, PCR: POSITIVE — AB
Staphylococcus aureus: POSITIVE — AB

## 2021-03-27 LAB — SARS CORONAVIRUS 2 (TAT 6-24 HRS): SARS Coronavirus 2: NEGATIVE

## 2021-03-27 MED ORDER — VANCOMYCIN HCL 1500 MG/300ML IV SOLN
1500.0000 mg | Freq: Once | INTRAVENOUS | Status: AC
  Administered 2021-03-28: 1500 mg via INTRAVENOUS
  Filled 2021-03-27: qty 300

## 2021-03-27 NOTE — Progress Notes (Signed)
Microbiology called to report a positive MRSA PCR.  Patient will receive betadine DOS.     Left message for [facility] at Dr. Lovell Sheehan' office.

## 2021-03-28 ENCOUNTER — Inpatient Hospital Stay (HOSPITAL_COMMUNITY): Payer: Medicare HMO

## 2021-03-28 ENCOUNTER — Encounter (HOSPITAL_COMMUNITY)
Admission: RE | Disposition: A | Discharge status: Skilled Nursing Facility | Payer: Self-pay | Source: Home / Self Care | Attending: Neurosurgery

## 2021-03-28 ENCOUNTER — Encounter (HOSPITAL_COMMUNITY): Payer: Self-pay | Admitting: Neurosurgery

## 2021-03-28 ENCOUNTER — Inpatient Hospital Stay (HOSPITAL_COMMUNITY)
Admission: RE | Admit: 2021-03-28 | Discharge: 2021-04-06 | DRG: 471 | Disposition: A | Discharge status: Skilled Nursing Facility | Payer: Medicare HMO | Attending: Neurosurgery | Admitting: Neurosurgery

## 2021-03-28 DIAGNOSIS — M4722 Other spondylosis with radiculopathy, cervical region: Secondary | ICD-10-CM | POA: Diagnosis not present

## 2021-03-28 DIAGNOSIS — R710 Precipitous drop in hematocrit: Secondary | ICD-10-CM | POA: Diagnosis not present

## 2021-03-28 DIAGNOSIS — Z981 Arthrodesis status: Secondary | ICD-10-CM | POA: Diagnosis not present

## 2021-03-28 DIAGNOSIS — Z20822 Contact with and (suspected) exposure to covid-19: Secondary | ICD-10-CM | POA: Diagnosis present

## 2021-03-28 DIAGNOSIS — R0602 Shortness of breath: Secondary | ICD-10-CM | POA: Diagnosis not present

## 2021-03-28 DIAGNOSIS — F319 Bipolar disorder, unspecified: Secondary | ICD-10-CM | POA: Diagnosis present

## 2021-03-28 DIAGNOSIS — M4712 Other spondylosis with myelopathy, cervical region: Secondary | ICD-10-CM | POA: Diagnosis present

## 2021-03-28 DIAGNOSIS — I10 Essential (primary) hypertension: Secondary | ICD-10-CM | POA: Diagnosis present

## 2021-03-28 DIAGNOSIS — Z887 Allergy status to serum and vaccine status: Secondary | ICD-10-CM

## 2021-03-28 DIAGNOSIS — Z79890 Hormone replacement therapy (postmenopausal): Secondary | ICD-10-CM

## 2021-03-28 DIAGNOSIS — Z90710 Acquired absence of both cervix and uterus: Secondary | ICD-10-CM

## 2021-03-28 DIAGNOSIS — E039 Hypothyroidism, unspecified: Secondary | ICD-10-CM

## 2021-03-28 DIAGNOSIS — M19032 Primary osteoarthritis, left wrist: Secondary | ICD-10-CM | POA: Diagnosis present

## 2021-03-28 DIAGNOSIS — I341 Nonrheumatic mitral (valve) prolapse: Secondary | ICD-10-CM | POA: Diagnosis present

## 2021-03-28 DIAGNOSIS — Z743 Need for continuous supervision: Secondary | ICD-10-CM | POA: Diagnosis not present

## 2021-03-28 DIAGNOSIS — R062 Wheezing: Secondary | ICD-10-CM | POA: Diagnosis not present

## 2021-03-28 DIAGNOSIS — Z87442 Personal history of urinary calculi: Secondary | ICD-10-CM

## 2021-03-28 DIAGNOSIS — J690 Pneumonitis due to inhalation of food and vomit: Secondary | ICD-10-CM | POA: Diagnosis present

## 2021-03-28 DIAGNOSIS — R2681 Unsteadiness on feet: Secondary | ICD-10-CM | POA: Diagnosis present

## 2021-03-28 DIAGNOSIS — R339 Retention of urine, unspecified: Secondary | ICD-10-CM | POA: Diagnosis not present

## 2021-03-28 DIAGNOSIS — R221 Localized swelling, mass and lump, neck: Secondary | ICD-10-CM | POA: Diagnosis not present

## 2021-03-28 DIAGNOSIS — E785 Hyperlipidemia, unspecified: Secondary | ICD-10-CM | POA: Diagnosis present

## 2021-03-28 DIAGNOSIS — Z881 Allergy status to other antibiotic agents status: Secondary | ICD-10-CM

## 2021-03-28 DIAGNOSIS — R451 Restlessness and agitation: Secondary | ICD-10-CM | POA: Diagnosis not present

## 2021-03-28 DIAGNOSIS — I69398 Other sequelae of cerebral infarction: Secondary | ICD-10-CM | POA: Diagnosis not present

## 2021-03-28 DIAGNOSIS — J986 Disorders of diaphragm: Secondary | ICD-10-CM | POA: Diagnosis present

## 2021-03-28 DIAGNOSIS — E669 Obesity, unspecified: Secondary | ICD-10-CM | POA: Diagnosis not present

## 2021-03-28 DIAGNOSIS — M19031 Primary osteoarthritis, right wrist: Secondary | ICD-10-CM | POA: Diagnosis present

## 2021-03-28 DIAGNOSIS — R061 Stridor: Secondary | ICD-10-CM | POA: Diagnosis not present

## 2021-03-28 DIAGNOSIS — Z882 Allergy status to sulfonamides status: Secondary | ICD-10-CM

## 2021-03-28 DIAGNOSIS — Z79899 Other long term (current) drug therapy: Secondary | ICD-10-CM

## 2021-03-28 DIAGNOSIS — E871 Hypo-osmolality and hyponatremia: Secondary | ICD-10-CM | POA: Diagnosis present

## 2021-03-28 DIAGNOSIS — R0603 Acute respiratory distress: Secondary | ICD-10-CM | POA: Diagnosis not present

## 2021-03-28 DIAGNOSIS — Z7982 Long term (current) use of aspirin: Secondary | ICD-10-CM

## 2021-03-28 DIAGNOSIS — M4322 Fusion of spine, cervical region: Secondary | ICD-10-CM | POA: Diagnosis not present

## 2021-03-28 DIAGNOSIS — R1313 Dysphagia, pharyngeal phase: Secondary | ICD-10-CM | POA: Diagnosis present

## 2021-03-28 DIAGNOSIS — Z6841 Body Mass Index (BMI) 40.0 and over, adult: Secondary | ICD-10-CM | POA: Diagnosis not present

## 2021-03-28 DIAGNOSIS — R531 Weakness: Secondary | ICD-10-CM | POA: Diagnosis not present

## 2021-03-28 DIAGNOSIS — G4733 Obstructive sleep apnea (adult) (pediatric): Secondary | ICD-10-CM | POA: Diagnosis not present

## 2021-03-28 DIAGNOSIS — Z886 Allergy status to analgesic agent status: Secondary | ICD-10-CM

## 2021-03-28 DIAGNOSIS — Z8672 Personal history of thrombophlebitis: Secondary | ICD-10-CM

## 2021-03-28 DIAGNOSIS — J189 Pneumonia, unspecified organism: Secondary | ICD-10-CM

## 2021-03-28 DIAGNOSIS — Z86711 Personal history of pulmonary embolism: Secondary | ICD-10-CM

## 2021-03-28 DIAGNOSIS — J449 Chronic obstructive pulmonary disease, unspecified: Secondary | ICD-10-CM | POA: Diagnosis present

## 2021-03-28 DIAGNOSIS — Z419 Encounter for procedure for purposes other than remedying health state, unspecified: Secondary | ICD-10-CM

## 2021-03-28 DIAGNOSIS — M4802 Spinal stenosis, cervical region: Secondary | ICD-10-CM | POA: Diagnosis present

## 2021-03-28 DIAGNOSIS — Z96653 Presence of artificial knee joint, bilateral: Secondary | ICD-10-CM | POA: Diagnosis present

## 2021-03-28 DIAGNOSIS — I517 Cardiomegaly: Secondary | ICD-10-CM | POA: Diagnosis not present

## 2021-03-28 HISTORY — PX: ANTERIOR CERVICAL DECOMP/DISCECTOMY FUSION: SHX1161

## 2021-03-28 LAB — ABO/RH: ABO/RH(D): O POS

## 2021-03-28 SURGERY — ANTERIOR CERVICAL DECOMPRESSION/DISCECTOMY FUSION 3 LEVELS
Anesthesia: General | Site: Spine Cervical

## 2021-03-28 MED ORDER — MONTELUKAST SODIUM 10 MG PO TABS
10.0000 mg | ORAL_TABLET | Freq: Every day | ORAL | Status: DC
  Administered 2021-03-29 – 2021-04-06 (×8): 10 mg via ORAL
  Filled 2021-03-28 (×8): qty 1

## 2021-03-28 MED ORDER — LIDOCAINE 2% (20 MG/ML) 5 ML SYRINGE
INTRAMUSCULAR | Status: AC
  Filled 2021-03-28: qty 5

## 2021-03-28 MED ORDER — ACETAMINOPHEN 160 MG/5ML PO SOLN: 325.0000 mg | Freq: Once | ORAL | Status: DC | PRN

## 2021-03-28 MED ORDER — PROMETHAZINE HCL 25 MG/ML IJ SOLN: 6.2500 mg | INTRAMUSCULAR | Status: DC | PRN

## 2021-03-28 MED ORDER — ACETAMINOPHEN 325 MG PO TABS: 325.0000 mg | ORAL_TABLET | Freq: Once | ORAL | Status: DC | PRN

## 2021-03-28 MED ORDER — PHENOL 1.4 % MT LIQD: 1.0000 | OROMUCOSAL | Status: DC | PRN

## 2021-03-28 MED ORDER — HEMOSTATIC AGENTS (NO CHARGE) OPTIME
TOPICAL | Status: DC | PRN
  Administered 2021-03-28: 1 via TOPICAL

## 2021-03-28 MED ORDER — SUCCINYLCHOLINE CHLORIDE 200 MG/10ML IV SOSY
PREFILLED_SYRINGE | INTRAVENOUS | Status: DC | PRN
  Administered 2021-03-28: 120 mg via INTRAVENOUS

## 2021-03-28 MED ORDER — DEXAMETHASONE SODIUM PHOSPHATE 10 MG/ML IJ SOLN
INTRAMUSCULAR | Status: AC
  Filled 2021-03-28: qty 1

## 2021-03-28 MED ORDER — ONDANSETRON HCL 4 MG PO TABS
4.0000 mg | ORAL_TABLET | Freq: Four times a day (QID) | ORAL | Status: DC | PRN
  Administered 2021-03-30: 4 mg via ORAL
  Filled 2021-03-28: qty 1

## 2021-03-28 MED ORDER — VERAPAMIL HCL ER 180 MG PO TBCR
360.0000 mg | EXTENDED_RELEASE_TABLET | Freq: Every day | ORAL | Status: DC
  Administered 2021-03-29 – 2021-04-06 (×8): 360 mg via ORAL
  Filled 2021-03-28 (×10): qty 2

## 2021-03-28 MED ORDER — GABAPENTIN 100 MG PO CAPS
100.0000 mg | ORAL_CAPSULE | Freq: Three times a day (TID) | ORAL | Status: DC
  Administered 2021-03-28 – 2021-04-06 (×23): 100 mg via ORAL
  Filled 2021-03-28 (×24): qty 1

## 2021-03-28 MED ORDER — MIDAZOLAM HCL 2 MG/2ML IJ SOLN
INTRAMUSCULAR | Status: AC
  Filled 2021-03-28: qty 2

## 2021-03-28 MED ORDER — ACETAMINOPHEN 325 MG PO TABS
650.0000 mg | ORAL_TABLET | ORAL | Status: DC | PRN
  Administered 2021-03-29 – 2021-04-05 (×6): 650 mg via ORAL
  Filled 2021-03-28 (×6): qty 2

## 2021-03-28 MED ORDER — ROCURONIUM BROMIDE 10 MG/ML (PF) SYRINGE
PREFILLED_SYRINGE | INTRAVENOUS | Status: DC | PRN
  Administered 2021-03-28 (×2): 20 mg via INTRAVENOUS
  Administered 2021-03-28: 10 mg via INTRAVENOUS
  Administered 2021-03-28: 50 mg via INTRAVENOUS

## 2021-03-28 MED ORDER — FENTANYL CITRATE (PF) 250 MCG/5ML IJ SOLN
INTRAMUSCULAR | Status: AC
  Filled 2021-03-28: qty 5

## 2021-03-28 MED ORDER — MIDAZOLAM HCL 5 MG/5ML IJ SOLN
INTRAMUSCULAR | Status: DC | PRN
  Administered 2021-03-28: 2 mg via INTRAVENOUS

## 2021-03-28 MED ORDER — ONDANSETRON HCL 4 MG/2ML IJ SOLN
INTRAMUSCULAR | Status: DC | PRN
  Administered 2021-03-28 (×2): 4 mg via INTRAVENOUS

## 2021-03-28 MED ORDER — LACTATED RINGERS IV SOLN: INTRAVENOUS | Status: DC

## 2021-03-28 MED ORDER — ONDANSETRON HCL 4 MG/2ML IJ SOLN
INTRAMUSCULAR | Status: AC
  Filled 2021-03-28: qty 2

## 2021-03-28 MED ORDER — BALANCED SALT IO SOLN
15.0000 mL | Freq: Once | INTRAOCULAR | Status: DC
  Filled 2021-03-28: qty 15

## 2021-03-28 MED ORDER — MECLIZINE HCL 25 MG PO TABS
25.0000 mg | ORAL_TABLET | Freq: Three times a day (TID) | ORAL | Status: DC | PRN
  Filled 2021-03-28: qty 1

## 2021-03-28 MED ORDER — 0.9 % SODIUM CHLORIDE (POUR BTL) OPTIME
TOPICAL | Status: DC | PRN
  Administered 2021-03-28: 1000 mL

## 2021-03-28 MED ORDER — DEXAMETHASONE 4 MG PO TABS
4.0000 mg | ORAL_TABLET | Freq: Four times a day (QID) | ORAL | Status: AC
  Administered 2021-03-28: 4 mg via ORAL
  Filled 2021-03-28: qty 1

## 2021-03-28 MED ORDER — CHLORHEXIDINE GLUCONATE 0.12 % MT SOLN
15.0000 mL | Freq: Once | OROMUCOSAL | Status: AC
  Administered 2021-03-28: 15 mL via OROMUCOSAL
  Filled 2021-03-28: qty 15

## 2021-03-28 MED ORDER — ZOLPIDEM TARTRATE 5 MG PO TABS: 5.0000 mg | ORAL_TABLET | Freq: Every evening | ORAL | Status: DC | PRN

## 2021-03-28 MED ORDER — BUPROPION HCL ER (XL) 150 MG PO TB24
150.0000 mg | ORAL_TABLET | Freq: Every day | ORAL | Status: DC
  Administered 2021-03-29 – 2021-04-06 (×8): 150 mg via ORAL
  Filled 2021-03-28 (×8): qty 1

## 2021-03-28 MED ORDER — ONDANSETRON HCL 4 MG/2ML IJ SOLN
4.0000 mg | Freq: Four times a day (QID) | INTRAMUSCULAR | Status: DC | PRN
  Administered 2021-03-29 (×3): 4 mg via INTRAVENOUS
  Filled 2021-03-28 (×4): qty 2

## 2021-03-28 MED ORDER — THROMBIN 5000 UNITS EX SOLR
CUTANEOUS | Status: AC
  Filled 2021-03-28: qty 5000

## 2021-03-28 MED ORDER — PROPOFOL 10 MG/ML IV BOLUS
INTRAVENOUS | Status: AC
  Filled 2021-03-28: qty 20

## 2021-03-28 MED ORDER — CHLORHEXIDINE GLUCONATE CLOTH 2 % EX PADS: 6.0000 | MEDICATED_PAD | Freq: Once | CUTANEOUS | Status: DC

## 2021-03-28 MED ORDER — BUPIVACAINE-EPINEPHRINE (PF) 0.5% -1:200000 IJ SOLN
INTRAMUSCULAR | Status: DC | PRN
  Administered 2021-03-28: 10 mL

## 2021-03-28 MED ORDER — BACITRACIN ZINC 500 UNIT/GM EX OINT
TOPICAL_OINTMENT | CUTANEOUS | Status: AC
  Filled 2021-03-28: qty 28.35

## 2021-03-28 MED ORDER — ALBUTEROL SULFATE (2.5 MG/3ML) 0.083% IN NEBU
3.0000 mL | INHALATION_SOLUTION | Freq: Four times a day (QID) | RESPIRATORY_TRACT | Status: DC | PRN
  Administered 2021-03-28 – 2021-03-31 (×4): 3 mL via RESPIRATORY_TRACT
  Filled 2021-03-28 (×4): qty 3

## 2021-03-28 MED ORDER — BUPIVACAINE-EPINEPHRINE 0.5% -1:200000 IJ SOLN
INTRAMUSCULAR | Status: AC
  Filled 2021-03-28: qty 1

## 2021-03-28 MED ORDER — HYDROCODONE-ACETAMINOPHEN 5-325 MG PO TABS
1.0000 | ORAL_TABLET | ORAL | Status: DC | PRN
  Administered 2021-03-31: 1 via ORAL
  Administered 2021-04-02 – 2021-04-05 (×4): 2 via ORAL
  Filled 2021-03-28: qty 2
  Filled 2021-03-28: qty 1
  Filled 2021-03-28 (×3): qty 2

## 2021-03-28 MED ORDER — PSYLLIUM 95 % PO PACK
1.0000 | PACK | Freq: Every day | ORAL | Status: DC
  Administered 2021-03-29 – 2021-04-04 (×6): 1 via ORAL
  Filled 2021-03-28 (×9): qty 1

## 2021-03-28 MED ORDER — MAGNESIUM OXIDE -MG SUPPLEMENT 400 (240 MG) MG PO TABS
400.0000 mg | ORAL_TABLET | Freq: Every day | ORAL | Status: DC
  Administered 2021-03-29 – 2021-04-06 (×8): 400 mg via ORAL
  Filled 2021-03-28 (×8): qty 1

## 2021-03-28 MED ORDER — ORAL CARE MOUTH RINSE: 15.0000 mL | Freq: Once | OROMUCOSAL | Status: AC

## 2021-03-28 MED ORDER — IRBESARTAN 150 MG PO TABS
300.0000 mg | ORAL_TABLET | Freq: Every day | ORAL | Status: DC
  Administered 2021-03-29 – 2021-03-30 (×2): 300 mg via ORAL
  Filled 2021-03-28 (×2): qty 2

## 2021-03-28 MED ORDER — CYCLOBENZAPRINE HCL 10 MG PO TABS
10.0000 mg | ORAL_TABLET | Freq: Three times a day (TID) | ORAL | Status: DC | PRN
  Administered 2021-03-28 – 2021-04-05 (×8): 10 mg via ORAL
  Filled 2021-03-28 (×9): qty 1

## 2021-03-28 MED ORDER — THROMBIN 5000 UNITS EX SOLR
OROMUCOSAL | Status: DC | PRN
  Administered 2021-03-28 (×2): 5 mL via TOPICAL

## 2021-03-28 MED ORDER — SUCCINYLCHOLINE CHLORIDE 200 MG/10ML IV SOSY
PREFILLED_SYRINGE | INTRAVENOUS | Status: AC
  Filled 2021-03-28: qty 10

## 2021-03-28 MED ORDER — KETAMINE HCL 10 MG/ML IJ SOLN
INTRAMUSCULAR | Status: DC | PRN
  Administered 2021-03-28: 15 mg via INTRAVENOUS
  Administered 2021-03-28: 10 mg via INTRAVENOUS

## 2021-03-28 MED ORDER — PROMETHAZINE HCL 25 MG PO TABS
25.0000 mg | ORAL_TABLET | Freq: Four times a day (QID) | ORAL | Status: DC | PRN
  Administered 2021-03-30: 25 mg via ORAL
  Filled 2021-03-28 (×2): qty 1

## 2021-03-28 MED ORDER — DEXAMETHASONE SODIUM PHOSPHATE 4 MG/ML IJ SOLN
4.0000 mg | Freq: Four times a day (QID) | INTRAMUSCULAR | Status: AC
  Administered 2021-03-28: 4 mg via INTRAVENOUS
  Filled 2021-03-28: qty 1

## 2021-03-28 MED ORDER — LEVOTHYROXINE SODIUM 50 MCG PO TABS
50.0000 ug | ORAL_TABLET | Freq: Every day | ORAL | Status: DC
  Administered 2021-03-29 – 2021-04-06 (×8): 50 ug via ORAL
  Filled 2021-03-28 (×4): qty 1
  Filled 2021-03-28: qty 2
  Filled 2021-03-28 (×3): qty 1

## 2021-03-28 MED ORDER — KETAMINE HCL 50 MG/5ML IJ SOSY
PREFILLED_SYRINGE | INTRAMUSCULAR | Status: AC
  Filled 2021-03-28: qty 5

## 2021-03-28 MED ORDER — ROCURONIUM BROMIDE 10 MG/ML (PF) SYRINGE
PREFILLED_SYRINGE | INTRAVENOUS | Status: AC
  Filled 2021-03-28: qty 10

## 2021-03-28 MED ORDER — ACETAMINOPHEN 650 MG RE SUPP: 650.0000 mg | RECTAL | Status: DC | PRN

## 2021-03-28 MED ORDER — MEPERIDINE HCL 25 MG/ML IJ SOLN: 6.2500 mg | INTRAMUSCULAR | Status: DC | PRN

## 2021-03-28 MED ORDER — HYDROMORPHONE HCL 1 MG/ML IJ SOLN
0.2500 mg | INTRAMUSCULAR | Status: DC | PRN
  Administered 2021-03-28 (×2): 0.25 mg via INTRAVENOUS

## 2021-03-28 MED ORDER — HYDROMORPHONE HCL 1 MG/ML IJ SOLN
INTRAMUSCULAR | Status: AC
  Filled 2021-03-28: qty 1

## 2021-03-28 MED ORDER — PHENYLEPHRINE HCL-NACL 10-0.9 MG/250ML-% IV SOLN
INTRAVENOUS | Status: DC | PRN
  Administered 2021-03-28: 40 ug/min via INTRAVENOUS

## 2021-03-28 MED ORDER — PROPOFOL 10 MG/ML IV BOLUS
INTRAVENOUS | Status: DC | PRN
  Administered 2021-03-28: 50 mg via INTRAVENOUS
  Administered 2021-03-28: 100 mg via INTRAVENOUS

## 2021-03-28 MED ORDER — CEFAZOLIN SODIUM-DEXTROSE 2-4 GM/100ML-% IV SOLN
2.0000 g | Freq: Three times a day (TID) | INTRAVENOUS | Status: AC
  Administered 2021-03-28 (×2): 2 g via INTRAVENOUS
  Filled 2021-03-28 (×2): qty 100

## 2021-03-28 MED ORDER — DOCUSATE SODIUM 100 MG PO CAPS
100.0000 mg | ORAL_CAPSULE | Freq: Two times a day (BID) | ORAL | Status: DC
  Administered 2021-03-28 – 2021-04-06 (×16): 100 mg via ORAL
  Filled 2021-03-28 (×18): qty 1

## 2021-03-28 MED ORDER — ACETAMINOPHEN 500 MG PO TABS
1000.0000 mg | ORAL_TABLET | Freq: Four times a day (QID) | ORAL | Status: AC
  Administered 2021-03-28 – 2021-03-29 (×3): 1000 mg via ORAL
  Filled 2021-03-28 (×3): qty 2

## 2021-03-28 MED ORDER — MORPHINE SULFATE (PF) 4 MG/ML IV SOLN
4.0000 mg | INTRAVENOUS | Status: DC | PRN
  Administered 2021-04-02: 4 mg via INTRAVENOUS
  Filled 2021-03-28: qty 1

## 2021-03-28 MED ORDER — KETOROLAC TROMETHAMINE 0.5 % OP SOLN
1.0000 [drp] | Freq: Three times a day (TID) | OPHTHALMIC | Status: AC | PRN
  Filled 2021-03-28: qty 5

## 2021-03-28 MED ORDER — OXYCODONE HCL 5 MG PO TABS
10.0000 mg | ORAL_TABLET | ORAL | Status: DC | PRN
Start: 2021-03-28 — End: 2021-04-02
  Administered 2021-03-28 – 2021-03-31 (×13): 10 mg via ORAL
  Filled 2021-03-28 (×14): qty 2

## 2021-03-28 MED ORDER — ACETAMINOPHEN 10 MG/ML IV SOLN: 1000.0000 mg | Freq: Once | INTRAVENOUS | Status: DC | PRN

## 2021-03-28 MED ORDER — CIPROFLOXACIN HCL 0.3 % OP SOLN
1.0000 [drp] | Freq: Three times a day (TID) | OPHTHALMIC | Status: AC
  Administered 2021-03-29: 1 [drp] via OPHTHALMIC
  Filled 2021-03-28: qty 2.5

## 2021-03-28 MED ORDER — PROPOFOL 500 MG/50ML IV EMUL
INTRAVENOUS | Status: DC | PRN
  Administered 2021-03-28: 100 ug/kg/min via INTRAVENOUS

## 2021-03-28 MED ORDER — PANTOPRAZOLE SODIUM 40 MG PO TBEC: 40.0000 mg | DELAYED_RELEASE_TABLET | Freq: Every day | ORAL | Status: DC

## 2021-03-28 MED ORDER — ATORVASTATIN CALCIUM 80 MG PO TABS
80.0000 mg | ORAL_TABLET | Freq: Every day | ORAL | Status: DC
  Administered 2021-03-29 – 2021-04-06 (×8): 80 mg via ORAL
  Filled 2021-03-28 (×8): qty 1

## 2021-03-28 MED ORDER — THROMBIN 20000 UNITS EX SOLR
CUTANEOUS | Status: DC | PRN
  Administered 2021-03-28: 20000 [IU] via TOPICAL

## 2021-03-28 MED ORDER — BUDESONIDE 0.25 MG/2ML IN SUSP
0.2500 mg | Freq: Two times a day (BID) | RESPIRATORY_TRACT | Status: DC
  Administered 2021-03-28 – 2021-04-06 (×15): 0.25 mg via RESPIRATORY_TRACT
  Filled 2021-03-28 (×18): qty 2

## 2021-03-28 MED ORDER — LIDOCAINE 2% (20 MG/ML) 5 ML SYRINGE
INTRAMUSCULAR | Status: DC | PRN
  Administered 2021-03-28: 40 mg via INTRAVENOUS

## 2021-03-28 MED ORDER — MENTHOL 3 MG MT LOZG
1.0000 | LOZENGE | OROMUCOSAL | Status: DC | PRN
  Administered 2021-04-03 – 2021-04-05 (×2): 3 mg via ORAL
  Filled 2021-03-28 (×3): qty 9

## 2021-03-28 MED ORDER — ALUM & MAG HYDROXIDE-SIMETH 200-200-20 MG/5ML PO SUSP: 30.0000 mL | Freq: Four times a day (QID) | ORAL | Status: DC | PRN

## 2021-03-28 MED ORDER — MIDAZOLAM HCL 5 MG/5ML IJ SOLN: INTRAMUSCULAR | Status: DC | PRN

## 2021-03-28 MED ORDER — CEFAZOLIN SODIUM-DEXTROSE 2-4 GM/100ML-% IV SOLN
INTRAVENOUS | Status: AC
  Filled 2021-03-28: qty 100

## 2021-03-28 MED ORDER — THROMBIN 20000 UNITS EX SOLR
CUTANEOUS | Status: AC
  Filled 2021-03-28: qty 20000

## 2021-03-28 MED ORDER — ESCITALOPRAM OXALATE 10 MG PO TABS
10.0000 mg | ORAL_TABLET | Freq: Every day | ORAL | Status: DC
  Administered 2021-03-29 – 2021-04-06 (×8): 10 mg via ORAL
  Filled 2021-03-28 (×8): qty 1

## 2021-03-28 MED ORDER — PANTOPRAZOLE SODIUM 40 MG IV SOLR
40.0000 mg | Freq: Every day | INTRAVENOUS | Status: DC
  Administered 2021-03-28 – 2021-03-29 (×2): 40 mg via INTRAVENOUS
  Filled 2021-03-28 (×2): qty 40

## 2021-03-28 MED ORDER — SUGAMMADEX SODIUM 200 MG/2ML IV SOLN
INTRAVENOUS | Status: DC | PRN
  Administered 2021-03-28: 200 mg via INTRAVENOUS

## 2021-03-28 MED ORDER — CEFAZOLIN SODIUM-DEXTROSE 2-4 GM/100ML-% IV SOLN
2.0000 g | INTRAVENOUS | Status: AC
  Administered 2021-03-28: 2 g via INTRAVENOUS
  Filled 2021-03-28: qty 100

## 2021-03-28 MED ORDER — VITAMIN D 25 MCG (1000 UNIT) PO TABS
1000.0000 [IU] | ORAL_TABLET | Freq: Every day | ORAL | Status: DC
  Administered 2021-03-28 – 2021-04-06 (×9): 1000 [IU] via ORAL
  Filled 2021-03-28 (×10): qty 1

## 2021-03-28 MED ORDER — AMISULPRIDE (ANTIEMETIC) 5 MG/2ML IV SOLN: 10.0000 mg | Freq: Once | INTRAVENOUS | Status: DC | PRN

## 2021-03-28 MED ORDER — FENTANYL CITRATE (PF) 100 MCG/2ML IJ SOLN
INTRAMUSCULAR | Status: DC | PRN
  Administered 2021-03-28: 100 ug via INTRAVENOUS
  Administered 2021-03-28 (×2): 50 ug via INTRAVENOUS

## 2021-03-28 MED ORDER — BISACODYL 10 MG RE SUPP
10.0000 mg | Freq: Every day | RECTAL | Status: DC | PRN
  Administered 2021-03-29: 10 mg via RECTAL
  Filled 2021-03-28: qty 1

## 2021-03-28 MED ORDER — DEXAMETHASONE SODIUM PHOSPHATE 10 MG/ML IJ SOLN
INTRAMUSCULAR | Status: DC | PRN
  Administered 2021-03-28: 10 mg via INTRAVENOUS

## 2021-03-28 MED ORDER — DARIFENACIN HYDROBROMIDE ER 15 MG PO TB24
15.0000 mg | ORAL_TABLET | Freq: Every day | ORAL | Status: DC
  Administered 2021-03-29 – 2021-04-06 (×8): 15 mg via ORAL
  Filled 2021-03-28 (×9): qty 1

## 2021-03-28 SURGICAL SUPPLY — 59 items
BAND RUBBER #18 3X1/16 STRL (MISCELLANEOUS) ×2 IMPLANT
BENZOIN TINCTURE PRP APPL 2/3 (GAUZE/BANDAGES/DRESSINGS) ×2 IMPLANT
BIT DRILL NEURO 2X3.1 SFT TUCH (MISCELLANEOUS) ×2 IMPLANT
BLADE SURG 15 STRL LF DISP TIS (BLADE) ×1 IMPLANT
BLADE SURG 15 STRL SS (BLADE) ×1
BLADE ULTRA TIP 2M (BLADE) ×2 IMPLANT
BUR BARREL STRAIGHT FLUTE 4.0 (BURR) ×4 IMPLANT
BUR MATCHSTICK NEURO 3.0 LAGG (BURR) ×4 IMPLANT
BUR NEURO DRILL SOFT 3.0X3.8M (BURR) ×2 IMPLANT
CAGE PEEK VISTAS 11X14X6 (Cage) ×4 IMPLANT
CANISTER SUCT 3000ML PPV (MISCELLANEOUS) ×2 IMPLANT
CARTRIDGE OIL MAESTRO DRILL (MISCELLANEOUS) ×1 IMPLANT
CLSR STERI-STRIP ANTIMIC 1/2X4 (GAUZE/BANDAGES/DRESSINGS) ×2 IMPLANT
COVER MAYO STAND STRL (DRAPES) ×2 IMPLANT
COVER WAND RF STERILE (DRAPES) IMPLANT
DIFFUSER DRILL AIR PNEUMATIC (MISCELLANEOUS) ×2 IMPLANT
DRAIN JACKSON PRATT 10MM FLAT (MISCELLANEOUS) ×2 IMPLANT
DRAPE LAPAROTOMY 100X72 PEDS (DRAPES) ×2 IMPLANT
DRAPE MICROSCOPE LEICA (MISCELLANEOUS) IMPLANT
DRAPE SURG 17X23 STRL (DRAPES) ×4 IMPLANT
DRILL NEURO 2X3.1 SOFT TOUCH (MISCELLANEOUS) ×4
DRSG OPSITE POSTOP 3X4 (GAUZE/BANDAGES/DRESSINGS) ×2 IMPLANT
DRSG OPSITE POSTOP 4X6 (GAUZE/BANDAGES/DRESSINGS) ×2 IMPLANT
ELECT REM PT RETURN 9FT ADLT (ELECTROSURGICAL) ×2
ELECTRODE REM PT RTRN 9FT ADLT (ELECTROSURGICAL) ×1 IMPLANT
EVACUATOR SILICONE 100CC (DRAIN) ×2 IMPLANT
GAUZE 4X4 16PLY RFD (DISPOSABLE) ×2 IMPLANT
GLOVE BIO SURGEON STRL SZ8 (GLOVE) ×2 IMPLANT
GLOVE BIO SURGEON STRL SZ8.5 (GLOVE) ×2 IMPLANT
GLOVE EXAM NITRILE XL STR (GLOVE) IMPLANT
GOWN STRL REUS W/ TWL LRG LVL3 (GOWN DISPOSABLE) IMPLANT
GOWN STRL REUS W/ TWL XL LVL3 (GOWN DISPOSABLE) ×1 IMPLANT
GOWN STRL REUS W/TWL LRG LVL3 (GOWN DISPOSABLE)
GOWN STRL REUS W/TWL XL LVL3 (GOWN DISPOSABLE) ×1
HEMOSTAT POWDER KIT SURGIFOAM (HEMOSTASIS) ×2 IMPLANT
KIT BASIN OR (CUSTOM PROCEDURE TRAY) ×2 IMPLANT
KIT TURNOVER KIT B (KITS) ×2 IMPLANT
MARKER SKIN DUAL TIP RULER LAB (MISCELLANEOUS) ×2 IMPLANT
NEEDLE HYPO 22GX1.5 SAFETY (NEEDLE) ×2 IMPLANT
NEEDLE SPNL 18GX3.5 QUINCKE PK (NEEDLE) ×2 IMPLANT
NS IRRIG 1000ML POUR BTL (IV SOLUTION) ×2 IMPLANT
OIL CARTRIDGE MAESTRO DRILL (MISCELLANEOUS) ×2
PACK LAMINECTOMY NEURO (CUSTOM PROCEDURE TRAY) ×2 IMPLANT
PATTIES SURGICAL 1X1 (DISPOSABLE) ×2 IMPLANT
PEEK OPTIMA VISTA-S 11X14X5MM (Peek) ×2 IMPLANT
PIN DISTRACTION 14MM (PIN) ×4 IMPLANT
PLATE ANT CERV XTEND 3 LV 42 (Plate) ×2 IMPLANT
PUTTY DBM 5CC CALC GRAN ×2 IMPLANT
SCREW XTD VAR 4.2 SELF TAP 12 (Screw) ×16 IMPLANT
SPONGE INTESTINAL PEANUT (DISPOSABLE) ×4 IMPLANT
SPONGE SURGIFOAM ABS GEL 100 (HEMOSTASIS) ×2 IMPLANT
SPONGE SURGIFOAM ABS GEL SZ50 (HEMOSTASIS) IMPLANT
STRIP CLOSURE SKIN 1/2X4 (GAUZE/BANDAGES/DRESSINGS) ×2 IMPLANT
SUT VIC AB 0 CT1 27 (SUTURE) ×1
SUT VIC AB 0 CT1 27XBRD ANTBC (SUTURE) ×1 IMPLANT
SUT VIC AB 3-0 SH 8-18 (SUTURE) ×2 IMPLANT
TOWEL GREEN STERILE (TOWEL DISPOSABLE) ×2 IMPLANT
TOWEL GREEN STERILE FF (TOWEL DISPOSABLE) ×2 IMPLANT
WATER STERILE IRR 1000ML POUR (IV SOLUTION) ×2 IMPLANT

## 2021-03-28 NOTE — Transfer of Care (Signed)
Immediate Anesthesia Transfer of Care Note  Patient: Courtney [name]  Procedure(s) Performed: Cervical three-four Cervical four-five Cervical five-six Anterior cervical decompression/discectomy/fusion/interbody prosthesis/plate/screws (N/A Spine Cervical)  Patient Location: PACU  Anesthesia Type:General  Level of Consciousness: drowsy and patient cooperative  Airway & Oxygen Therapy: Patient Spontanous Breathing and Patient connected to face mask oxygen  Post-op Assessment: Report given to RN and Post -op Vital signs reviewed and stable  Post vital signs: Reviewed and stable  Last Vitals:  Vitals Value Taken Time  BP 141/79 03/28/21 1205  Temp 36.5 C 03/28/21 1205  Pulse 77 03/28/21 1208  Resp 15 03/28/21 1208  SpO2 100 % 03/28/21 1208  Vitals shown include unvalidated device data.  Last Pain:  Vitals:   03/28/21 0740  TempSrc:   PainSc: 0-No pain         Complications: No complications documented.

## 2021-03-28 NOTE — Anesthesia Preprocedure Evaluation (Addendum)
Anesthesia Evaluation  Patient identified by MRN, date of birth, ID band Patient awake    Reviewed: Allergy & Precautions, NPO status , Patient's Chart, lab work & pertinent test results  History of Anesthesia Complications (+) PONV and history of anesthetic complications  Airway Mallampati: II  TM Distance: >3 FB Neck ROM: Limited    Dental  (+) Teeth Intact, Caps, Dental Advisory Given   Pulmonary asthma , sleep apnea ,    breath sounds clear to auscultation       Cardiovascular hypertension,  Rhythm:Regular Rate:Normal     Neuro/Psych  Headaches, PSYCHIATRIC DISORDERS Anxiety Bipolar Disorder CVA    GI/Hepatic Neg liver ROS, hiatal hernia,   Endo/Other  negative endocrine ROS  Renal/GU Renal disease     Musculoskeletal  (+) Arthritis ,   Abdominal Normal abdominal exam  (+)   Peds  Hematology   Anesthesia Other Findings - RUE pain and tingling  Reproductive/Obstetrics                           Anesthesia Physical Anesthesia Plan  ASA: III  Anesthesia Plan: General   Post-op Pain Management:    Induction: Intravenous  PONV Risk Score and Plan: 4 or greater and Ondansetron, Dexamethasone and Treatment may vary due to age or medical condition  Airway Management Planned: Oral ETT and Video Laryngoscope Planned  Additional Equipment: None  Intra-op Plan:   Post-operative Plan: Extubation in OR  Informed Consent: I have reviewed the patients History and Physical, chart, labs and discussed the procedure including the risks, benefits and alternatives for the proposed anesthesia with the patient or authorized representative who has indicated his/her understanding and acceptance.     Dental advisory given  Plan Discussed with: CRNA  Anesthesia Plan Comments:        Anesthesia Quick Evaluation

## 2021-03-28 NOTE — Anesthesia Procedure Notes (Addendum)
Procedure Name: Intubation Date/Time: 03/28/2021 8:48 AM Performed by: Noel Christmas, RN Pre-anesthesia Checklist: Patient identified, Emergency Drugs available, Suction available and Patient being monitored Patient Re-evaluated:Patient Re-evaluated prior to induction Oxygen Delivery Method: Circle system utilized Preoxygenation: Pre-oxygenation with 100% oxygen Induction Type: IV induction and Rapid sequence Laryngoscope Size: Glidescope and 3 Grade View: Grade I Tube type: Oral Tube size: 7.0 mm Number of attempts: 1 Airway Equipment and Method: Stylet and Oral airway Placement Confirmation: ETT inserted through vocal cords under direct vision,  positive ETCO2 and breath sounds checked- equal and bilateral Secured at: 21 cm Tube secured with: Tape Dental Injury: Teeth and Oropharynx as per pre-operative assessment

## 2021-03-28 NOTE — H&P (Signed)
Subjective: The patient is a 75 year old white female who has complained of neck pain with right greater left shoulder and arm pain, unsteady gait, etc.  She has failed medical management and was worked up with a cervical MRI which demonstrated spondylosis and stenosis most prominent at C3-4, C4-5 and C5-6.  I discussed the various treatment options with her.  She has decided proceed with surgery.  Past Medical History:  Diagnosis Date  . Anemia   . Arthritis    Bilateral Wrists  . Asthma   . Baker's cyst of knee   . Bipolar disorder (HCC)   . Complication of anesthesia   . Diverticulitis   . Dyspnea   . Headache   . Hiatal hernia   . History of kidney stones 2006  . Hypertension   . Kidney stone   . Mitral valve prolapse   . Panic attack   . Phlebitis   . PONV (postoperative nausea and vomiting)   . Pulmonary embolus (HCC)   . Sleep apnea   . Spastic colon   . Stroke (HCC)   . Vertigo     Past Surgical History:  Procedure Laterality Date  . ABDOMINAL HYSTERECTOMY    . BACK SURGERY    . BREAST REDUCTION SURGERY    . CHOLECYSTECTOMY    . EYE SURGERY Bilateral 2000   Cataract  . JOINT REPLACEMENT Bilateral    Knee replacement  . KNEE SURGERY    . KNEE SURGERY    . TOE SURGERY    . TONSILLECTOMY      Allergies  Allergen Reactions  . Tetanus-Diphtheria Toxoids Td Anaphylaxis  . Meloxicam Nausea And Vomiting  . Phentermine Nausea And Vomiting  . Sulfa Antibiotics Diarrhea and Nausea And Vomiting  . Zithromax [Azithromycin] Diarrhea and Nausea And Vomiting    Social History   Tobacco Use  . Smoking status: Never Smoker  . Smokeless tobacco: Never Used  Substance Use Topics  . Alcohol use: Not Currently    History reviewed. No pertinent family history. Prior to Admission medications   Medication Sig Start Date End Date Taking? Authorizing Provider  albuterol (VENTOLIN HFA) 108 (90 Base) MCG/ACT inhaler Inhale 1 puff into the lungs every 6 (six) hours as needed.  02/22/21  Yes [provider]  aspirin 325 MG tablet Take 325 mg by mouth daily.   Yes [provider]  atorvastatin (LIPITOR) 80 MG tablet Take 80 mg by mouth daily. 02/21/21  Yes [provider]  Biotin 5000 MCG CAPS Take 5,000 mcg by mouth daily.   Yes [provider]  buPROPion (WELLBUTRIN XL) 150 MG 24 hr tablet Take 150 mg by mouth daily. 05/27/16  Yes [provider]  Cholecalciferol (VITAMIN D3) 25 MCG (1000 UT) CAPS Take 1,000 Units by mouth daily.   Yes [provider]  cyanocobalamin (,VITAMIN B-12,) 1000 MCG/ML injection Inject 1,000 mcg into the muscle every 30 (thirty) days. 02/23/21  Yes [provider]  diclofenac Sodium (VOLTAREN) 1 % GEL Apply 2 g topically daily. 12/02/20  Yes [provider]  docusate sodium (COLACE) 100 MG capsule Take 200 mg by mouth daily.   Yes [provider]  escitalopram (LEXAPRO) 10 MG tablet Take 10 mg by mouth daily. 11/24/20  Yes [provider]  FLOVENT HFA 44 MCG/ACT inhaler Inhale 2 puffs into the lungs in the morning and at bedtime. 02/22/21  Yes [provider]  gabapentin (NEURONTIN) 100 MG capsule Take 100 mg by mouth in  the morning, at noon, and at bedtime. 02/13/21  Yes [provider]  levothyroxine (SYNTHROID) 50 MCG tablet Take 50 mcg by mouth daily before breakfast. 02/09/21  Yes [provider]  LORazepam (ATIVAN) 1 MG tablet Take 1 mg by mouth daily. 02/21/21  Yes [provider]  magnesium oxide (MAG-OX) 400 MG tablet Take 400 mg by mouth daily.   Yes [provider]  meclizine (ANTIVERT) 25 MG tablet Take 25 mg by mouth 3 (three) times daily as needed for dizziness.   Yes [provider]  melatonin 5 MG TABS Take 5 mg by mouth at bedtime.   Yes [provider]  montelukast (SINGULAIR) 10 MG tablet Take 10 mg by mouth daily. 02/13/21  Yes [provider]  multivitamin-lutein (OCUVITE-LUTEIN)  CAPS capsule Take 1 capsule by mouth daily.   Yes [provider]  olmesartan (BENICAR) 40 MG tablet Take 40 mg by mouth daily. 02/21/21  Yes [provider]  OZEMPIC, 0.25 OR 0.5 MG/DOSE, 2 MG/1.5ML SOPN Inject 0.25 mg into the skin every Tuesday. 02/27/21  Yes [provider]  pantoprazole (PROTONIX) 40 MG tablet Take 40 mg by mouth daily. 02/13/21  Yes [provider]  promethazine (PHENERGAN) 25 MG tablet Take 25 mg by mouth every 6 (six) hours as needed for nausea/vomiting.   Yes [provider]  psyllium (REGULOID) 0.52 g capsule Take 1.04 g by mouth daily.   Yes [provider]  solifenacin (VESICARE) 10 MG tablet Take 10 mg by mouth daily. 02/21/21  Yes [provider]  verapamil (CALAN-SR) 120 MG CR tablet Take 360 mg by mouth daily. 02/13/21  Yes [provider]  HYDROcodone-acetaminophen (NORCO/VICODIN) 5-325 MG tablet Take 1 tablet by mouth every 4 (four) hours as needed. Patient not taking: Reported on 03/14/2021 01/21/21   Alvira Monday, MD     Review of Systems  Positive ROS: As above  All other systems have been reviewed and were otherwise negative with the exception of those mentioned in the HPI and as above.  Objective: Vital signs in last 24 hours: Temp:  [98.1 F (36.7 C)] 98.1 F (36.7 C) (06/08 0719) Pulse Rate:  [89] 89 (06/08 0719) Resp:  [18] 18 (06/08 0719) BP: (156)/(94) 156/94 (06/08 0719) SpO2:  [96 %] 96 % (06/08 0719) Weight:  [100.4 kg] 100.4 kg (06/08 0719) Estimated body mass index is 40.48 kg/m as calculated from the following:   Height as of this encounter: 5\' 2"  (1.575 m).   Weight as of this encounter: 100.4 kg.   General Appearance: Alert Head: Normocephalic, without obvious abnormality, atraumatic Eyes: PERRL, conjunctiva/corneas clear, EOM's intact,    Ears: Normal  Throat: Normal  Neck: Limited cervical range of motion Back: unremarkable Lungs: Clear to auscultation  bilaterally, respirations unlabored Heart: Regular rate and rhythm, no murmur, rub or gallop Abdomen: Soft, non-tender Extremities: Extremities normal, atraumatic, no cyanosis or edema Skin: unremarkable  NEUROLOGIC:   Mental status: alert and oriented,Motor Exam - grossly normal Sensory Exam - grossly normal Reflexes:  Coordination - grossly normal Gait - grossly normal Balance - grossly normal Cranial Nerves: I: smell Not tested  II: visual acuity  OS: Normal  OD: Normal   II: visual fields Full to confrontation  II: pupils Equal, round, reactive to light  III,VII: ptosis None  III,IV,VI: extraocular muscles  Full ROM  V: mastication Normal  V: facial light touch sensation  Normal  V,VII: corneal reflex  Present  VII: facial muscle  function - upper  Normal  VII: facial muscle function - lower Normal  VIII: hearing Not tested  IX: soft palate elevation  Normal  IX,X: gag reflex Present  XI: trapezius strength  5/5  XI: sternocleidomastoid strength 5/5  XI: neck flexion strength  5/5  XII: tongue strength  Normal    Data Review Lab Results  Component Value Date   WBC 6.9 03/26/2021   HGB 13.1 03/26/2021   HCT 42.5 03/26/2021   MCV 93.2 03/26/2021   PLT 244 03/26/2021   Lab Results  Component Value Date   NA 134 (L) 03/26/2021   K 4.4 03/26/2021   CL 99 03/26/2021   CO2 25 03/26/2021   BUN 10 03/26/2021   CREATININE 0.80 03/26/2021   GLUCOSE 93 03/26/2021   No results found for: INR, PROTIME  Assessment/Plan: C3-4, C4-5 and C5-6 spondylosis, stenosis, cervicalgia, cervical radiculopathy, cervical myelopathy: I have discussed situation with the patient.  I have reviewed her imaging studies with her and pointed out the abnormalities.  We have discussed the various treatment options including surgery.  I have described surgical treatment option of a C3-4, C4-5 and C5-6 anterior cervicectomy, fusion and plating.  I have shown her surgical models.  I have given her  a surgical pamphlet.  We have discussed the risk, benefits, alternatives, expected postop course, and likelihood of achieving our goals with surgery.  I have answered all her questions.  She has decided proceed with surgery.  Cristi Loron 03/28/2021 8:20 AM

## 2021-03-28 NOTE — Anesthesia Postprocedure Evaluation (Signed)
Anesthesia Post Note  Patient: Courtney [name]  Procedure(s) Performed: Cervical three-four Cervical four-five Cervical five-six Anterior cervical decompression/discectomy/fusion/interbody prosthesis/plate/screws (N/A Spine Cervical)     Patient location during evaluation: PACU Anesthesia Type: General Level of consciousness: awake and alert Pain management: pain level controlled Vital Signs Assessment: post-procedure vital signs reviewed and stable Respiratory status: spontaneous breathing, nonlabored ventilation, respiratory function stable and patient connected to nasal cannula oxygen Cardiovascular status: blood pressure returned to baseline and stable Postop Assessment: no apparent nausea or vomiting Anesthetic complications: no   No complications documented.  Last Vitals:  Vitals:   03/28/21 1420 03/28/21 1458  BP: 133/75 133/89  Pulse: 84 93  Resp: 14 18  Temp:  36.5 C  SpO2: 99% 93%    Last Pain:  Vitals:   03/28/21 1458  TempSrc: Oral  PainSc:                  Shelton Silvas

## 2021-03-28 NOTE — Progress Notes (Signed)
Subjective: The patient is somnolent but arousable.  She is in no apparent distress.  Objective: Vital signs in last 24 hours: Temp:  [98.1 F (36.7 C)] 98.1 F (36.7 C) (06/08 0719) Pulse Rate:  [89] 89 (06/08 0719) Resp:  [18] 18 (06/08 0719) BP: (156)/(94) 156/94 (06/08 0719) SpO2:  [96 %] 96 % (06/08 0719) Weight:  [100.4 kg] 100.4 kg (06/08 0719) Estimated body mass index is 40.48 kg/m as calculated from the following:   Height as of this encounter: 5\' 2"  (1.575 m).   Weight as of this encounter: 100.4 kg.   Intake/Output from previous day: No intake/output data recorded. Intake/Output this shift: Total I/O In: 500 [I.V.:400; IV Piggyback:100] Out: 550 [Urine:250; Blood:300]  Physical exam the patient is somnolent but arousable.  She is moving all 4 extremities.  The patient's dressing is clean and dry.  There is no hematoma or shift.  Lab Results: Recent Labs    03/26/21 1530  WBC 6.9  HGB 13.1  HCT 42.5  PLT 244   BMET Recent Labs    03/26/21 1530  NA 134*  K 4.4  CL 99  CO2 25  GLUCOSE 93  BUN 10  CREATININE 0.80  CALCIUM 9.2    Studies/Results: No results found.  Assessment/Plan: The patient is doing well.  I spoke with her husband.  LOS: 0 days     05/26/21 03/28/2021, 12:04 PM

## 2021-03-28 NOTE — Op Note (Signed)
Brief history: The patient is a 75 year old white female who has complained of neck and arm pain consistent with a cervical radiculopathy.  She has failed medical management was worked up with a cervical MRI which demonstrated spondylosis and stenosis most prominent at C3-4, C4-5 and C5-6.  I discussed the various treatment options with her.  She has decided proceed with surgery.  Preoperative diagnosis: Cervical spondylosis, cervical stenosis, cervical radiculopathy, cervical myelopathy, cervicalgia  Postoperative diagnosis: The same  Procedure: C3-4, C4-5 and C5-6 anterior cervical discectomy/decompression; C3-4, C4-5 and C5-6 interbody arthrodesis with local morcellized autograft bone and Zimmer DBM; insertion of interbody prosthesis at C3-4, C4-5 and C5-6 (Zimmer peek interbody prosthesis); anterior cervical plating from C3-C6 with globus titanium plate  Surgeon: Dr. Delma Officer  Asst.: Dr. Ervin Knack and Hildred Priest, NP  Anesthesia: Gen. endotracheal  Estimated blood loss: 125 cc  Drains: 1 Jackson-Pratt drain in the prevertebral space  Complications: None  Description of procedure: The patient was brought to the operating room by the anesthesia team. General endotracheal anesthesia was induced. A roll was placed under the patient's shoulders to keep the neck in the neutral position. The patient's anterior cervical region was then prepared with Betadine scrub and Betadine solution. Sterile drapes were applied.  The area to be incised was then injected with Marcaine with epinephrine solution. I then used a scalpel to make a transverse incision in the patient's left anterior neck. I used the Metzenbaum scissors to divide the platysmal muscle and then to dissect medial to the sternocleidomastoid muscle, jugular vein, and carotid artery. I carefully dissected down towards the anterior cervical spine identifying the esophagus and retracting it medially. Then using Kitner swabs to  clear soft tissue from the anterior cervical spine. We then inserted a bent spinal needle into the upper exposed intervertebral disc space. We then obtained intraoperative radiographs confirm our location.  I then used electrocautery to detach the medial border of the longus colli muscle bilaterally from the C3-4, C4-5 and C5-6 intervertebral disc spaces. I then inserted the Caspar self-retaining retractor underneath the longus colli muscle bilaterally to provide exposure.  We then incised the intervertebral disc at C3-4. We then performed a partial intervertebral discectomy with a pituitary forceps and the Karlin curettes. I then inserted distraction screws into the vertebral bodies at C3 and C4. We then distracted the interspace. We then used the high-speed drill to decorticate the vertebral endplates at C3-4, to drill away the remainder of the intervertebral disc, to drill away some posterior spondylosis, and to thin out the posterior longitudinal ligament. I then incised ligament with the arachnoid knife. We then removed the ligament with a Kerrison punches undercutting the vertebral endplates and decompressing the thecal sac. We then performed foraminotomies about the bilateral C4 nerve roots. This completed the decompression at this level.  We then repeated this procedure in analogous fashion at C4-5 and C5-6 decompressing the thecal sac and the bilateral C5 and C6 nerve roots.  We now turned our to attention to the interbody fusion. We used the trial spacers to determine the appropriate size for the interbody prosthesis. We then pre-filled prosthesis with a combination of local morcellized autograft bone that we obtained during decompression as well as Zimmer DBM. We then inserted the prosthesis into the distracted interspace at C3-4, C4-5 and C5-6. We then removed the distraction screws. There was a good snug fit of the prosthesis in the interspace.  Having completed the fusion we now turned  attention to the  anterior spinal instrumentation. We used the high-speed drill to drill away some anterior spondylosis at the disc spaces so that the plate lay down flat. We selected the appropriate length titanium anterior cervical plate. We laid it along the anterior aspect of the vertebral bodies from C3-C6. We then drilled 12 mm holes at C3, C4, C5 and C6. We then secured the plate to the vertebral bodies by placing two 12 mm self-tapping screws at C3, C4, C5 and C6. We then obtained intraoperative radiograph. The demonstrating good position of the instrumentation. We therefore secured the screws the plate the locking each cam. This completed the instrumentation.  We then obtained hemostasis using bipolar electrocautery. We irrigated the wound out with bacitracin solution. We then removed the retractor. We inspected the esophagus for any damage. There was none apparent.  We placed a 10 mm flat Jackson-Pratt drain in the prevertebral space and tunneled it out through a separate stab wound.  We then reapproximated patient's platysmal muscle with interrupted 3-0 Vicryl suture. We then reapproximated the subcutaneous tissue with interrupted 3-0 Vicryl suture. The skin was reapproximated with Steri-Strips and benzoin. The wound was then covered with bacitracin ointment. A sterile dressing was applied. The drapes were removed. Patient was subsequently extubated by the anesthesia team and transported to the post anesthesia care unit in stable condition. All sponge instrument and needle counts were reportedly correct at the end of this case.

## 2021-03-28 NOTE — Progress Notes (Signed)
Orthopedic Tech Progress Note Patient Details:  Courtney [name] Mar 17, 1946 [id number] RN stated patient has brace Patient ID: [name], female   DOB: 1946/03/13, 75 y.o.   MRN: [id number]   [name] 03/28/2021, 4:38 PM

## 2021-03-29 ENCOUNTER — Inpatient Hospital Stay (HOSPITAL_COMMUNITY): Payer: Medicare HMO

## 2021-03-29 ENCOUNTER — Other Ambulatory Visit: Payer: Self-pay

## 2021-03-29 ENCOUNTER — Encounter (HOSPITAL_COMMUNITY): Payer: Self-pay | Admitting: Neurosurgery

## 2021-03-29 DIAGNOSIS — R0603 Acute respiratory distress: Secondary | ICD-10-CM

## 2021-03-29 LAB — URINALYSIS, ROUTINE W REFLEX MICROSCOPIC
Bilirubin Urine: NEGATIVE
Glucose, UA: NEGATIVE mg/dL
Hgb urine dipstick: NEGATIVE
Ketones, ur: NEGATIVE mg/dL
Leukocytes,Ua: NEGATIVE
Nitrite: NEGATIVE
Protein, ur: NEGATIVE mg/dL
Specific Gravity, Urine: 1.011 (ref 1.005–1.030)
pH: 6 (ref 5.0–8.0)

## 2021-03-29 MED ORDER — CHLORHEXIDINE GLUCONATE CLOTH 2 % EX PADS
6.0000 | MEDICATED_PAD | Freq: Every day | CUTANEOUS | Status: DC
  Administered 2021-03-29 – 2021-04-06 (×4): 6 via TOPICAL

## 2021-03-29 MED ORDER — DEXAMETHASONE SODIUM PHOSPHATE 10 MG/ML IJ SOLN
6.0000 mg | Freq: Once | INTRAMUSCULAR | Status: AC
  Administered 2021-03-29: 6 mg via INTRAVENOUS
  Filled 2021-03-29: qty 1

## 2021-03-29 MED ORDER — DEXAMETHASONE SODIUM PHOSPHATE 4 MG/ML IJ SOLN
4.0000 mg | Freq: Four times a day (QID) | INTRAMUSCULAR | Status: AC
  Administered 2021-03-29 – 2021-03-30 (×2): 4 mg via INTRAVENOUS
  Filled 2021-03-29 (×2): qty 1

## 2021-03-29 MED ORDER — DEXAMETHASONE SODIUM PHOSPHATE 4 MG/ML IJ SOLN: 4.0000 mg | Freq: Four times a day (QID) | INTRAMUSCULAR | Status: DC

## 2021-03-29 MED ORDER — HYDROXYZINE HCL 50 MG/ML IM SOLN
50.0000 mg | Freq: Four times a day (QID) | INTRAMUSCULAR | Status: DC | PRN
  Administered 2021-03-29: 50 mg via INTRAMUSCULAR
  Filled 2021-03-29 (×2): qty 1

## 2021-03-29 MED ORDER — TAMSULOSIN HCL 0.4 MG PO CAPS
0.4000 mg | ORAL_CAPSULE | Freq: Every day | ORAL | Status: DC
  Administered 2021-03-29 – 2021-04-06 (×8): 0.4 mg via ORAL
  Filled 2021-03-29 (×8): qty 1

## 2021-03-29 MED ORDER — DEXAMETHASONE SODIUM PHOSPHATE 4 MG/ML IJ SOLN
4.0000 mg | Freq: Once | INTRAMUSCULAR | Status: AC
  Administered 2021-03-29: 4 mg via INTRAVENOUS
  Filled 2021-03-29: qty 1

## 2021-03-29 NOTE — Progress Notes (Signed)
Subjective: The patient is alert and pleasant.  She looks well.  She had urinary retention last night and needed to be catheterized.  She has chronic bladder issues.  She is planning to go to [facility] for her recovery.  Objective: Vital signs in last 24 hours: Temp:  [97.6 F (36.4 C)-98.2 F (36.8 C)] 98 F (36.7 C) (06/09 0357) Pulse Rate:  [76-93] 82 (06/09 0357) Resp:  [14-25] 20 (06/09 0357) BP: (102-148)/(59-97) 147/95 (06/09 0357) SpO2:  [93 %-100 %] 96 % (06/09 0357) Estimated body mass index is 40.48 kg/m as calculated from the following:   Height as of this encounter: 5\' 2"  (1.575 m).   Weight as of this encounter: 100.4 kg.   Intake/Output from previous day: 06/08 0701 - 06/09 0700 In: 550 [I.V.:450; IV Piggyback:100] Out: 1640 [Urine:1300; Drains:40; Blood:300] Intake/Output this shift: No intake/output data recorded.  Physical exam the patient is alert and pleasant.  She is moving all 4 extremities well.  Her dressing is clean and dry.  There is no evidence of hematoma or shift.  Lab Results: Recent Labs    03/26/21 1530  WBC 6.9  HGB 13.1  HCT 42.5  PLT 244   BMET Recent Labs    03/26/21 1530  NA 134*  K 4.4  CL 99  CO2 25  GLUCOSE 93  BUN 10  CREATININE 0.80  CALCIUM 9.2    Studies/Results: DG Cervical Spine 2 or 3 views  Result Date: 03/28/2021 CLINICAL DATA:  Cervical ACDF EXAM: CERVICAL SPINE - 2-3 VIEW COMPARISON:  12/12/2020 FINDINGS: Image number 1 at 0925 hours: Metallic probe via anterior approach projects over the anterior aspect of the C3-C4 disc space. Image 2 at 1114 hours: Anterior plate and screws extending from C4-C6. Intervening disc prostheses. Surgical sponges anterior to the surgical levels. IMPRESSION: Intraoperative images during anterior cervical spine fusion. Electronically Signed   By: 12/14/2020 M.D.   On: 03/28/2021 16:07    Assessment/Plan: Postop day #1: The patient is doing well.  We will mobilize her with PT  and OT.  I will ask care management to arrange for her to go to [facility].  Urinary retention: The patient has chronic bladder issues.  I will add Flomax.  We will insert a Foley if she needs to be catheterized more than 1 more time.  I will check a urinalysis and culture.  LOS: 1 day     [place] 03/29/2021, 7:31 AM

## 2021-03-29 NOTE — Progress Notes (Signed)
Patient is transferred from room 3C07 to unit 4N ICU 29 at this time. Alert and in stable condition. Report given to receiving nurse [facility], RN with all questions answered. Left unit via bed with all belongings and spouse at side.

## 2021-03-29 NOTE — Progress Notes (Signed)
Patient noted having expiratory wheezes and during morning assessment. Patient stated she had it before surgery but not as bad. Respiratory therapist administered breathing treatment and inhalers given with minimal result. Around noon, patient noted coughing with emesis, wheezing and stridor. Vistaril IM administered with minimal relief. Patient had also not voided so bladder scan showed and intermittent cath removed . Patient also stated she had not had a bowel movement in days so Bisacodyl suppository given with no result at this time.  Patient continued to cough and face noted to turn blue when coughing. Sats below 90% on room air. O2 at 2L applied and sats went to 97 and 98%.  NP notified and ordered carried. Rapid response notified and came to bedside and assist patient. MD also came and assess patient. Ordered to transfer to ICU. Awaiting bed at this time.  Around 5pm, patient felt she had to use the bathroom. Nurse tech assisted patient as this Clinical research associate was attending to another patient patient. Patient was being assisted back to bed when her legs buckled. Nurse tech called out for help and this Clinical research associate with rapid response nurse went in room and assisted patient gently to the floor and went to get the stedy lift to put patient back to bed. Husband was in room. No apparent injuries noted.  Patient will be transferred to ICU when bed is ready.

## 2021-03-29 NOTE — Progress Notes (Signed)
Patient noted having expiratory wheezes during morning assessment. Patient stated she had it before surgery but not as bad. Respiratory therapist administered breathing treatment and inhalers given with minimal result.  Around noon, patient noted coughing

## 2021-03-29 NOTE — Consult Note (Signed)
NAME:  Courtney [name], MRN:  [id number], DOB:  July 28, 1946, LOS: 1 ADMISSION DATE:  03/28/2021, CONSULTATION DATE:  03/29/2021 REFERRING MD:  Dr, Lovell Sheehan, CHIEF COMPLAINT:  Stridor    History of Present Illness:  Courtney [name] is a 75 y.o. female with a PMX significant for hypertension, pulmonary embolism, prior stroke with residual right diaphragm paralysis, COPD/asthma, vertigo, mitral valve prolapse anemia, and depression who presented for elective surgical treatment of cervical radiculopathy due to complaints of neck and arm pain that failed medical management.  Patient's presurgery cervical MRI demonstrated spondylosis and stenosis most prominent at C3-4, C4-5, and C5-6.   Patient initially tolerated procedure well with no acute complications.  By afternoon of 6/9 patient developed acute respiratory distress with upper airway stridor and wheezing.  Decision was made to obtain cervical CT and transfer to ICU for ongoing care.  Stridor and wheezing improved with IV Decadron and nebulizers.  PCCM consulted for further management.  Pertinent  Medical History  Hypertension, pulmonary embolism, prior stroke with residual right diaphragm paralysis, COPD/asthma, vertigo, mitral valve prolapse anemia, and depression  Significant Hospital Events: Including procedures, antibiotic start and stop dates in addition to other pertinent events   6/8 Admitted for elective surgical repair of cervical radiculopathy 6/9 Developed stridor and wheezing concerning for postoperative hematoma.  Patient transferred to ICU cervical CT pending  Interim History / Subjective:  Patient seen sitting up in bed in no acute distress, reports wheezing and stridor improved after steroids.  Objective   Blood pressure 132/67, pulse 81, temperature 98.8 F (37.1 C), temperature source Oral, resp. rate 18, height 5\' 2"  (1.575 m), weight 100.4 kg, SpO2 96 %.        Intake/Output Summary (Last 24 hours) at 03/29/2021 1726 Last  data filed at 03/29/2021 1602 Gross per 24 hour  Intake 240 ml  Output 1145 ml  Net -905 ml   Filed Weights   03/28/21 0719  Weight: 100.4 kg    Examination: General: Very pleasant elderly female lying in bed in no acute distress HEENT: ETT, MM pink/moist, PERRL, cervical dressing clean dry and intact, JP to cervical incision patent Neuro: Alert and oriented x3, nonfocal CV: s1s2 regular rate and rhythm, no murmur, rubs, or gallops,  PULM: No increased work of breathing, no added breath sounds, cervical wheezing heard on auscultation GI: soft, bowel sounds active in all 4 quadrants, non-tender, non-distended,  Extremities: warm/dry, no edema  Skin: no rashes or lesions  Labs/imaging that I havepersonally reviewed     Resolved Hospital Problem list     Assessment & Plan:  Postoperative upper airway stridor -Sudden onset of upper airway stridor developed evening of 6/9 Hx of COPD/Asthma  Hx of diaphragmatic paralysis post CVA Hx of PE  P: JP remains in place with no increase in output  Continue IV Decadron  Consider racemic epi if striod worsens  Continue supplemental oxygen for sats greater than 90 Soft diet  Transfer to ICU for close monitoring  PRN BDs  Encourage pulmonary hygiene   Cervical radiculopathy -S/P surgical repair 6/8 P: Primary management per neurosurgery  Supportive care   Best practice   Diet:  Oral Pain/Anxiety/Delirium protocol (if indicated): No VAP protocol (if indicated): Not indicated DVT prophylaxis: SCD GI prophylaxis: N/A Glucose control:  SSI No Central venous access:  N/A Arterial line:  N/A Foley:  N/A Mobility:  OOB  PT consulted: Yes Last date of multidisciplinary goals of care discussion Per primary Code Status:  full code Disposition: Transfer to ICU  Labs   CBC: Recent Labs  Lab 03/26/21 1530  WBC 6.9  HGB 13.1  HCT 42.5  MCV 93.2  PLT 244    Basic Metabolic Panel: Recent Labs  Lab 03/26/21 1530  NA 134*   K 4.4  CL 99  CO2 25  GLUCOSE 93  BUN 10  CREATININE 0.80  CALCIUM 9.2   GFR: Estimated Creatinine Clearance: 69.4 mL/min (by C-G formula based on SCr of 0.8 mg/dL). Recent Labs  Lab 03/26/21 1530  WBC 6.9    Liver Function Tests: No results for input(s): AST, ALT, ALKPHOS, BILITOT, PROT, ALBUMIN in the last 168 hours. No results for input(s): LIPASE, AMYLASE in the last 168 hours. No results for input(s): AMMONIA in the last 168 hours.  ABG No results found for: PHART, PCO2ART, PO2ART, HCO3, TCO2, ACIDBASEDEF, O2SAT   Coagulation Profile: No results for input(s): INR, PROTIME in the last 168 hours.  Cardiac Enzymes: No results for input(s): CKTOTAL, CKMB, CKMBINDEX, TROPONINI in the last 168 hours.  HbA1C: No results found for: HGBA1C  CBG: No results for input(s): GLUCAP in the last 168 hours.  Review of Systems:   Please see the history of present illness. All other systems reviewed and are negative   Past Medical History:  She,  has a past medical history of Anemia, Arthritis, Asthma, Baker's cyst of knee, Bipolar disorder (HCC), Complication of anesthesia, Diverticulitis, Dyspnea, Headache, Hiatal hernia, History of kidney stones (2006), Hypertension, Kidney stone, Mitral valve prolapse, Panic attack, Phlebitis, PONV (postoperative nausea and vomiting), Pulmonary embolus (HCC), Sleep apnea, Spastic colon, Stroke (HCC), and Vertigo.   Surgical History:   Past Surgical History:  Procedure Laterality Date   ABDOMINAL HYSTERECTOMY     ANTERIOR CERVICAL DECOMP/DISCECTOMY FUSION N/A 03/28/2021   Procedure: Cervical three-four Cervical four-five Cervical five-six Anterior cervical decompression/discectomy/fusion/interbody prosthesis/plate/screws;  Surgeon: Tressie Stalker, MD;  Location: [facility] OR;  Service: Neurosurgery;  Laterality: N/A;   BACK SURGERY     BREAST REDUCTION SURGERY     CHOLECYSTECTOMY     EYE SURGERY Bilateral 2000   Cataract   JOINT REPLACEMENT  Bilateral    Knee replacement   KNEE SURGERY     KNEE SURGERY     TOE SURGERY     TONSILLECTOMY       Social History:   reports that she has never smoked. She has never used smokeless tobacco. She reports previous alcohol use. She reports previous drug use.   Family History:  Her family history is not on file.   Allergies Allergies  Allergen Reactions   Tetanus-Diphtheria Toxoids Td Anaphylaxis   Meloxicam Nausea And Vomiting   Phentermine Nausea And Vomiting   Sulfa Antibiotics Diarrhea and Nausea And Vomiting   Zithromax [Azithromycin] Diarrhea and Nausea And Vomiting     Home Medications  Prior to Admission medications   Medication Sig Start Date End Date Taking? Authorizing Provider  albuterol (VENTOLIN HFA) 108 (90 Base) MCG/ACT inhaler Inhale 1 puff into the lungs every 6 (six) hours as needed. 02/22/21  Yes [provider]  aspirin 325 MG tablet Take 325 mg by mouth daily.   Yes [provider]  atorvastatin (LIPITOR) 80 MG tablet Take 80 mg by mouth daily. 02/21/21  Yes [provider]  Biotin 5000 MCG CAPS Take 5,000 mcg by mouth daily.   Yes [provider]  buPROPion (WELLBUTRIN XL) 150 MG 24 hr tablet Take 150 mg by mouth daily. 05/27/16  Yes [provider]  Cholecalciferol (VITAMIN D3) 25 MCG (1000 UT) CAPS Take 1,000 Units by mouth daily.   Yes [provider]  cyanocobalamin (,VITAMIN B-12,) 1000 MCG/ML injection Inject 1,000 mcg into the muscle every 30 (thirty) days. 02/23/21  Yes [provider]  diclofenac Sodium (VOLTAREN) 1 % GEL Apply 2 g topically daily. 12/02/20  Yes [provider]  docusate sodium (COLACE) 100 MG capsule Take 200 mg by mouth daily.   Yes [provider]  escitalopram (LEXAPRO) 10 MG tablet Take 10 mg by mouth daily. 11/24/20  Yes [provider]  FLOVENT HFA 44 MCG/ACT inhaler Inhale 2 puffs into the lungs in the morning and at bedtime. 02/22/21  Yes [provider]  gabapentin (NEURONTIN) 100 MG capsule Take 100 mg by mouth in the morning, at noon, and at bedtime. 02/13/21  Yes [provider]  levothyroxine (SYNTHROID) 50 MCG tablet Take 50 mcg by mouth daily before breakfast. 02/09/21  Yes [provider]  LORazepam (ATIVAN) 1 MG tablet Take 1 mg by mouth daily. 02/21/21  Yes [provider]  magnesium oxide (MAG-OX) 400 MG tablet Take 400 mg by mouth daily.   Yes [provider]  meclizine (ANTIVERT) 25 MG tablet Take 25 mg by mouth 3 (three) times daily as needed for dizziness.   Yes [provider]  melatonin 5 MG TABS Take 5 mg by mouth at bedtime.   Yes [provider]  montelukast (SINGULAIR) 10 MG tablet Take 10 mg by mouth daily. 02/13/21  Yes [provider]  multivitamin-lutein (OCUVITE-LUTEIN) CAPS capsule Take 1 capsule by mouth daily.   Yes [provider]  olmesartan (BENICAR) 40 MG tablet Take 40 mg by mouth daily. 02/21/21  Yes [provider]  OZEMPIC, 0.25 OR 0.5 MG/DOSE, 2 MG/1.5ML SOPN Inject 0.25 mg into the skin every Tuesday. 02/27/21  Yes [provider]  pantoprazole (PROTONIX) 40 MG tablet Take 40 mg by mouth daily. 02/13/21  Yes [provider]  promethazine (PHENERGAN) 25 MG tablet Take 25 mg by mouth every 6 (six) hours as needed for nausea/vomiting.   Yes [provider]  psyllium (REGULOID) 0.52 g capsule Take 1.04 g by mouth daily.   Yes [provider]  solifenacin (VESICARE) 10 MG tablet Take 10 mg by mouth daily. 02/21/21  Yes [provider]  verapamil (CALAN-SR) 120 MG CR tablet Take 360 mg by mouth daily. 02/13/21  Yes [provider]  HYDROcodone-acetaminophen (NORCO/VICODIN) 5-325 MG tablet Take 1 tablet by mouth every 4 (four) hours as needed. Patient not taking: Reported on 03/14/2021 01/21/21   Alvira Monday, MD     Critical care time: N/A  Delfin Gant, NP-C [place]  [facility] Personal contact information can be found on Amion  03/29/2021, 5:50 PM

## 2021-03-29 NOTE — Progress Notes (Signed)
Placed patient on home vent for the night with oxygen set at 2lpm.

## 2021-03-29 NOTE — Evaluation (Signed)
Occupational Therapy Evaluation Patient Details Name: Courtney [name] MRN: [id number] DOB: [birth year] Today's Date: 03/29/2021    History of Present Illness Pt is a 75 y/o female who presents s/p C3-C6 ACDF on 03/28/2021. PMH significant for bipolar disorder, HTN, mitral valve prolapse, claustrophobia, CVA, vertigo, PE, prior back surgery, B knee replacements.   Clinical Impression    PTA, pt was living with her husband at [facility] and required assistance for ADLs and supervision for use of rollator. Pt currently requiring Mod-max A for UB ADLs, Max A for LB ADLs, and Min A for functional mobility with RW. Pt with decreased strength, ROM, balance, and activity tolerance due to pain and nausea. Despite nausea, pt very appreciative of therapy and OOB activity. Pt would benefit from further acute OT to facilitate safe dc. Recommend dc to SNF for further OT to optimize safety, independence with ADLs, and return to PLOF.    Follow Up Recommendations  SNF    Equipment Recommendations  None recommended by OT    Recommendations for Other Services PT consult     Precautions / Restrictions Precautions Precautions: Fall;Cervical Precaution Booklet Issued: Yes (comment) Precaution Comments: Reviewed a portion of the handout, and pt was cued for precautions during functional mobility. Required Braces or Orthoses: Cervical Brace Cervical Brace: Hard collar (Per MD able to take it off as needed due to claustrophobia.) Restrictions Weight Bearing Restrictions: No      Mobility Bed Mobility Overal bed mobility: Needs Assistance Bed Mobility: Rolling;Sidelying to Sit;Sit to Sidelying Rolling: Min guard Sidelying to sit: Min guard     Sit to sidelying: Min assist General bed mobility comments: Min Guard A for safety with log roll out of bed. Cues for sequencing nad increased time. Min A for elevating BELs over EOB in return to sidelying    Transfers Overall transfer level: Needs  assistance Equipment used: Rolling walker (2 wheeled) Transfers: Sit to/from Stand Sit to Stand: Min assist         General transfer comment: Min A for slight  posteiror lean and power up    Balance Overall balance assessment: Needs assistance Sitting-balance support: No upper extremity supported;Feet supported Sitting balance-Leahy Scale: Fair     Standing balance support: Bilateral upper extremity supported;During functional activity Standing balance-Leahy Scale: Poor                             ADL either performed or assessed with clinical judgement   ADL Overall ADL's : Needs assistance/impaired Eating/Feeding: Set up;Sitting   Grooming: Set up;Wash/dry hands;Sitting   Upper Body Bathing: Moderate assistance;Sitting   Lower Body Bathing: Maximal assistance;Sit to/from stand   Upper Body Dressing : Maximal assistance;Sitting Upper Body Dressing Details (indicate cue type and reason): Max A for donning/doffing collar Lower Body Dressing: Maximal assistance;Sit to/from stand   Toilet Transfer: Minimal assistance;Ambulation;RW;Regular Toilet           Functional mobility during ADLs: Min guard;Rolling walker General ADL Comments: Pt presenitng with decreased ROM, balance, and activity tolerance. Limited due to pain and nausea.     Vision Baseline Vision/History: Wears glasses Wears Glasses: At all times Patient Visual Report: No change from baseline       Perception     Praxis      Pertinent Vitals/Pain Pain Assessment: Faces Faces Pain Scale: Hurts little more Pain Intervention(s): Monitored during session;Limited activity within patient's tolerance;Repositioned     Hand Dominance Right  Extremity/Trunk Assessment Upper Extremity Assessment Upper Extremity Assessment: Generalized weakness   Lower Extremity Assessment Lower Extremity Assessment: Defer to PT evaluation   Cervical / Trunk Assessment Cervical / Trunk Assessment: Other  exceptions;Kyphotic Cervical / Trunk Exceptions: s/p cervical sx   Communication Communication Communication: No difficulties   Cognition Arousal/Alertness: Awake/alert Behavior During Therapy: WFL for tasks assessed/performed Overall Cognitive Status: Within Functional Limits for tasks assessed                                     General Comments  Reports nausea    Exercises     Shoulder Instructions      Home Living Family/patient expects to be discharged to:: Skilled nursing facility Living Arrangements: Spouse/significant other Available Help at Discharge: Family;Available 24 hours/day                         Home Equipment: Dan Humphreys - 4 wheels   Additional Comments: Lives at [facility] and plans on going to rehab section      Prior Functioning/Environment Level of Independence: Needs assistance  Gait / Transfers Assistance Needed: Shelda Altes f rollator with supervision ADL's / Homemaking Assistance Needed: Assist for ADLs as needed   Comments: Uses a rollator during the day and husband assist with a wheelchair at night to get her to the bathroom. (Simultaneous filing. User may not have seen previous data.)        OT Problem List: Decreased range of motion;Decreased activity tolerance;Impaired balance (sitting and/or standing);Decreased knowledge of use of DME or AE;Decreased knowledge of precautions      OT Treatment/Interventions: Self-care/ADL training;Therapeutic exercise;Energy conservation;DME and/or AE instruction;Therapeutic activities;Patient/family education    OT Goals(Current goals can be found in the care plan section) Acute Rehab OT Goals Patient Stated Goal: Go to rehab and get stronger OT Goal Formulation: With patient Time For Goal Achievement: 04/12/21 Potential to Achieve Goals: Good  OT Frequency: Min 2X/week   Barriers to D/C:            Co-evaluation PT/OT/SLP Co-Evaluation/Treatment: Yes Reason for  Co-Treatment: For patient/therapist safety;To address functional/ADL transfers PT goals addressed during session: Mobility/safety with mobility;Balance;Proper use of DME OT goals addressed during session: ADL's and self-care      AM-PAC OT "6 Clicks" Daily Activity     Outcome Measure Help from another person eating meals?: A Little Help from another person taking care of personal grooming?: A Little Help from another person toileting, which includes using toliet, bedpan, or urinal?: A Little Help from another person bathing (including washing, rinsing, drying)?: A Lot Help from another person to put on and taking off regular upper body clothing?: A Lot Help from another person to put on and taking off regular lower body clothing?: A Lot 6 Click Score: 15   End of Session Equipment Utilized During Treatment: Rolling walker Nurse Communication: Mobility status  Activity Tolerance: Patient tolerated treatment well Patient left: in bed;with call bell/phone within reach (with RT)  OT Visit Diagnosis: Unsteadiness on feet (R26.81);Other abnormalities of gait and mobility (R26.89);Muscle weakness (generalized) (M62.81)                Time: 3536-1443 OT Time Calculation (min): 33 min Charges:  OT General Charges $OT Visit: 1 Visit OT Evaluation $OT Eval Moderate Complexity: 1 Mod  Huston Stonehocker MSOT, OTR/L Acute Rehab Pager: [phone] Office: [phone]  Luan Moore  M Amit Meloy 03/29/2021, 9:04 AM

## 2021-03-29 NOTE — TOC Initial Note (Addendum)
Transition of Care [facility]) - Initial/Assessment Note    Patient Details  Name: Courtney [name] MRN: [id number] Date of Birth: [birth year]  [facility] CM/SW Contact:    Beckie Busing, RN Phone Number:[phone]  03/29/2021, 2:19 PM  Clinical Narrative:                 [facility] consulted for patient that comes from [facility] and will now require short term rehab at [facility]. FL2 completed, PASRR obtained # [id number]. Insurance Berkley Harvey can not be obtained by CM due to insurance is not managed by [employer]. [facility] will need to obtain insurance auth. [facility] at [facility] has been made aware. TOC will continue to follow with plan to discharge back to [facility] tomorrow. Message has been sent to primary nurse to request new covid test.   Expected Discharge Plan: Skilled Nursing Facility Barriers to Discharge: Continued Medical Work up   Patient Goals and CMS Choice Patient states their goals for this hospitalization and ongoing recovery are:: Patient wants to get out of the hospital and on to rehab CMS Medicare.gov Compare Post Acute Care list provided to:: Patient Choice offered to / list presented to : Patient  Expected Discharge Plan and Services Expected Discharge Plan: Skilled Nursing Facility In-house Referral: NA Discharge Planning Services: NA Post Acute Care Choice: Skilled Nursing Facility Living arrangements for the past 2 months: Apartment ([facility]dependent [facility] at [facility])                 DME Arranged: N/A DME Agency: NA       HH Arranged: NA HH Agency: NA        Prior Living Arrangements/Services Living arrangements for the past 2 months: Apartment ([facility]dependent Living at [facility]) Lives with:: Spouse Patient language and need for interpreter reviewed:: Yes Do you feel safe going back to the place where you live?: Yes      Need for Family Participation in Patient Care: Yes (Comment)   Current home services:  (n/a) Criminal  Activity/Legal Involvement Pertinent to Current Situation/Hospitalization: No - Comment as needed  Activities of Daily Living Home Assistive Devices/Equipment: Eyeglasses, Blood pressure cuff, Scales, Walker (specify type), Wheelchair, Medical laboratory scientific officer (specify quad or straight), Grab bars in shower, Shower chair with back (Built in shower bench) ADL Screening (condition at time of admission) Patient's cognitive ability adequate to safely complete daily activities?: Yes Is the patient deaf or have difficulty hearing?: No Does the patient have difficulty seeing, even when wearing glasses/contacts?: No Does the patient have difficulty concentrating, remembering, or making decisions?: Yes (Difficulty concentrating) Patient able to express need for assistance with ADLs?: Yes Does the patient have difficulty dressing or bathing?: Yes Independently performs ADLs?: No Communication: Independent Dressing (OT): Needs assistance Is this a change from baseline?: Change from baseline, expected to last <3days Grooming: Independent Feeding: Independent Bathing: Needs assistance Is this a change from baseline?: Change from baseline, expected to last <3 days Toileting: Needs assistance Is this a change from baseline?: Change from baseline, expected to last <3 days In/Out Bed: Needs assistance Is this a change from baseline?: Change from baseline, expected to last <3 days Does the patient have difficulty walking or climbing stairs?: Yes Weakness of Legs: Both Weakness of Arms/Hands: Left  Permission Sought/Granted   Permission granted to share information with : No              Emotional Assessment Appearance:: Appears stated age Attitude/Demeanor/Rapport: Gracious Affect (typically observed): Pleasant Orientation: : Oriented  to Self, Oriented to Place, Oriented to  Time, Oriented to Situation Alcohol / Substance Use: Not Applicable Psych Involvement: No (comment)  Admission diagnosis:  Cervical  spondylosis with myelopathy and radiculopathy [M47.12, M47.22] Patient Active Problem List   Diagnosis Date Noted   Cervical spondylosis with myelopathy and radiculopathy 03/28/2021   PCP:  Georgann Housekeeper, MD Pharmacy:   [facility] [address], [place] - [address] [place] [address] Phone: [phone] Fax: [phone]     Social Determinants of Health (SDOH) Interventions    Readmission Risk Interventions No flowsheet data found.

## 2021-03-29 NOTE — [place] FL2 (Signed)
[place] MEDICAID FL2 LEVEL OF CARE SCREENING TOOL     IDENTIFICATION  Patient Name: Courtney [name] Birthdate: [birth year] Sex: female Admission Date (Current Location): 03/28/2021  [facility] and [place] Number:  Producer, television/film/video and Address:  [facility], [address], [place], [place] [address]      Provider Number: [id number]  Attending Physician Name and Address:  Tressie Stalker, MD  Relative Name and Phone Number:  Trenell Moxey [phone]    Current Level of Care: Hospital Recommended Level of Care: Skilled Nursing Facility Prior Approval Number:    Date Approved/Denied:   PASRR Number: [id number]  Discharge Plan: SNF    Current Diagnoses: Patient Active Problem List   Diagnosis Date Noted   Cervical spondylosis with myelopathy and radiculopathy 03/28/2021    Orientation RESPIRATION BLADDER Height & Weight     Self, Time, Situation, Place  Normal (expiratory wheezing) Continent Weight: 100.4 kg Height:  5\' 2"  (157.5 cm)  BEHAVIORAL SYMPTOMS/MOOD NEUROLOGICAL BOWEL NUTRITION STATUS     (n/a) Continent Diet (regular diet)  AMBULATORY STATUS COMMUNICATION OF NEEDS Skin   Limited Assist Verbally Surgical wounds (surgical incision anterior neck with jp drain and honeycomb dressing)                       Personal Care Assistance Level of Assistance  Bathing, Feeding, Dressing Bathing Assistance: Limited assistance Feeding assistance: Independent Dressing Assistance: Limited assistance     Functional Limitations Info  Sight, Hearing, Speech Sight Info: Adequate Hearing Info: Adequate Speech Info: Adequate    SPECIAL CARE FACTORS FREQUENCY  PT (By licensed PT), OT (By licensed OT)     PT Frequency: 5X OT Frequency: 5X            Contractures Contractures Info: Not present    Additional Factors Info  Code Status, Allergies, Psychotropic, Insulin Sliding Scale, Isolation Precautions, Suctioning Needs Code  Status Info: Full Allergies Info: Tetanus-diphtheria Toxoids Td, Meloxicam, Phentermine, Sulfa Antibiotics, Zithromax (Azithromycin Psychotropic Info: see d/c summary for psychotropic info Insulin Sliding Scale Info: see d/c summary for sliding scale info Isolation Precautions Info: n/a Suctioning Needs: n/a   Current Medications (03/29/2021):  This is the current hospital active medication list Current Facility-Administered Medications  Medication Dose Route Frequency Provider Last Rate Last Admin   acetaminophen (TYLENOL) tablet 650 mg  650 mg Oral Q4H PRN 05/29/2021, MD   650 mg at 03/29/21 0356   Or   acetaminophen (TYLENOL) suppository 650 mg  650 mg Rectal Q4H PRN 05/29/21, MD       albuterol (PROVENTIL) (2.5 MG/3ML) 0.083% nebulizer solution 3 mL  3 mL Inhalation Q6H PRN Tressie Stalker, MD   3 mL at 03/29/21 0831   alum & mag hydroxide-simeth (MAALOX/MYLANTA) 200-200-20 MG/5ML suspension 30 mL  30 mL Oral Q6H PRN 04-29-2001, MD       atorvastatin (LIPITOR) tablet 80 mg  80 mg Oral Daily Tressie Stalker, MD   80 mg at 03/29/21 0935   balanced salts (BSS) intraocular solution 15 mL  15 mL Intraocular Once 05/29/21, MD       bisacodyl (DULCOLAX) suppository 10 mg  10 mg Rectal Daily PRN Shelton Silvas, MD       budesonide (PULMICORT) nebulizer solution 0.25 mg  0.25 mg Nebulization BID Tressie Stalker, MD   0.25 mg at 03/29/21 05/29/21   buPROPion (WELLBUTRIN XL) 24 hr tablet 150 mg  150 mg Oral  Daily Tressie Stalker, MD   150 mg at 03/29/21 0935   cholecalciferol (VITAMIN D3) tablet 1,000 Units  1,000 Units Oral Daily Tressie Stalker, MD   1,000 Units at 03/29/21 0935   ciprofloxacin (CILOXAN) 0.3 % ophthalmic solution 1 drop  1 drop Right Eye Q8H Shelton Silvas, MD   1 drop at 03/29/21 0933   cyclobenzaprine (FLEXERIL) tablet 10 mg  10 mg Oral TID PRN Tressie Stalker, MD   10 mg at 03/29/21 0356   darifenacin (ENABLEX) 24 hr tablet 15 mg  15 mg Oral Daily  Tressie Stalker, MD   15 mg at 03/29/21 0936   docusate sodium (COLACE) capsule 100 mg  100 mg Oral BID Tressie Stalker, MD   100 mg at 03/29/21 0935   escitalopram (LEXAPRO) tablet 10 mg  10 mg Oral Daily Tressie Stalker, MD   10 mg at 03/29/21 0935   gabapentin (NEURONTIN) capsule 100 mg  100 mg Oral TID Tressie Stalker, MD   100 mg at 03/29/21 0935   HYDROcodone-acetaminophen (NORCO/VICODIN) 5-325 MG per tablet 1-2 tablet  1-2 tablet Oral Q4H PRN Tressie Stalker, MD       hydrOXYzine (VISTARIL) injection 50 mg  50 mg Intramuscular Q6H PRN Tressie Stalker, MD   50 mg at 03/29/21 1300   irbesartan (AVAPRO) tablet 300 mg  300 mg Oral Daily Tressie Stalker, MD   300 mg at 03/29/21 0935   ketorolac (ACULAR) 0.5 % ophthalmic solution 1 drop  1 drop Right Eye Q8H PRN Shelton Silvas, MD       lactated ringers infusion   Intravenous Continuous Tressie Stalker, MD       levothyroxine (SYNTHROID) tablet 50 mcg  50 mcg Oral Q0600 Tressie Stalker, MD   50 mcg at 03/29/21 0356   magnesium oxide (MAG-OX) tablet 400 mg  400 mg Oral Daily Tressie Stalker, MD   400 mg at 03/29/21 0935   meclizine (ANTIVERT) tablet 25 mg  25 mg Oral TID PRN Tressie Stalker, MD       menthol-cetylpyridinium (CEPACOL) lozenge 3 mg  1 lozenge Oral PRN Tressie Stalker, MD       Or   phenol (CHLORASEPTIC) mouth spray 1 spray  1 spray Mouth/Throat PRN Tressie Stalker, MD       montelukast (SINGULAIR) tablet 10 mg  10 mg Oral Daily Tressie Stalker, MD   10 mg at 03/29/21 0935   morphine 4 MG/ML injection 4 mg  4 mg Intravenous Q2H PRN Tressie Stalker, MD       ondansetron [facility]) tablet 4 mg  4 mg Oral Q6H PRN Tressie Stalker, MD       Or   ondansetron [facility]) injection 4 mg  4 mg Intravenous Q6H PRN Tressie Stalker, MD   4 mg at 03/29/21 5462   oxyCODONE (Oxy IR/ROXICODONE) immediate release tablet 10 mg  10 mg Oral Q3H PRN Tressie Stalker, MD   10 mg at 03/29/21 0936   pantoprazole (PROTONIX) injection 40 mg  40  mg Intravenous QHS Tressie Stalker, MD   40 mg at 03/28/21 2056   promethazine (PHENERGAN) tablet 25 mg  25 mg Oral Q6H PRN Tressie Stalker, MD       psyllium (HYDROCIL/METAMUCIL) 1 packet  1 packet Oral Daily Tressie Stalker, MD   1 packet at 03/29/21 0935   tamsulosin (FLOMAX) capsule 0.4 mg  0.4 mg Oral QPC breakfast Tressie Stalker, MD   0.4 mg at 03/29/21 0935   verapamil (CALAN-SR) CR tablet  360 mg  360 mg Oral Daily Tressie Stalker, MD   360 mg at 03/29/21 0935   zolpidem (AMBIEN) tablet 5 mg  5 mg Oral QHS PRN Tressie Stalker, MD         Discharge Medications: Please see discharge summary for a list of discharge medications.  Relevant Imaging Results:  Relevant Lab Results:   Additional Information ss# [id number]  covid vaccine pfizer 12/18/19, 11/27/19  Beckie Busing, RN

## 2021-03-29 NOTE — Progress Notes (Signed)
Subjective: I was called and informed the patient was having respiratory difficulties.  I immediately came to see the patient.  Rapid response has seen her as well.  The patient's husband is at the bedside.  The patient complains of shortness of breath and some stridor.  She has a long history of pulmonary problems with a previous pulmonary embolism, COPD/asthma, paralyzed hemidiaphragm, etc.  Objective: Vital signs in last 24 hours: Temp:  [97.6 F (36.4 C)-98.8 F (37.1 C)] 98.8 F (37.1 C) (06/09 1554) Pulse Rate:  [81-91] 81 (06/09 1554) Resp:  [17-20] 18 (06/09 1554) BP: (102-148)/(59-97) 132/67 (06/09 1554) SpO2:  [90 %-99 %] 96 % (06/09 1554) Estimated body mass index is 40.48 kg/m as calculated from the following:   Height as of this encounter: 5\' 2"  (1.575 m).   Weight as of this encounter: 100.4 kg.   Intake/Output from previous day: 06/08 0701 - 06/09 0700 In: 550 [I.V.:450; IV Piggyback:100] Out: 1640 [Urine:1300; Drains:40; Blood:300] Intake/Output this shift: Total I/O In: 240 [P.O.:240] Out: 455 [Urine:400; Drains:55]  Physical exam the patient is alert and pleasant.  She is mildly stridorous and short of breath.  Examination of her neck demonstrates no palpable hematoma or midline shift.  Her drain is in place.  Lab Results: No results for input(s): WBC, HGB, HCT, PLT in the last 72 hours. BMET No results for input(s): NA, K, CL, CO2, GLUCOSE, BUN, CREATININE, CALCIUM in the last 72 hours.  Studies/Results: DG Cervical Spine 2 or 3 views  Result Date: 03/28/2021 CLINICAL DATA:  Cervical ACDF EXAM: CERVICAL SPINE - 2-3 VIEW COMPARISON:  12/12/2020 FINDINGS: Image number 1 at 0925 hours: Metallic probe via anterior approach projects over the anterior aspect of the C3-C4 disc space. Image 2 at 1114 hours: Anterior plate and screws extending from C4-C6. Intervening disc prostheses. Surgical sponges anterior to the surgical levels. IMPRESSION: Intraoperative images  during anterior cervical spine fusion. Electronically Signed   By: 12/14/2020 M.D.   On: 03/28/2021 16:07   DG CHEST PORT 1 VIEW  Result Date: 03/29/2021 CLINICAL DATA:  Wheezing EXAM: PORTABLE CHEST 1 VIEW COMPARISON:  None. FINDINGS: There is consolidation of left lung base. Elevation of right hemidiaphragm is noted. There is no pulmonary edema. Heart size is probably enlarged. Status post prior fixation of cervical spine. IMPRESSION: Consolidation of left lung base, pneumonia suspected. Electronically Signed   By: 05/29/2021 M.D.   On: 03/29/2021 16:08    Assessment/Plan: Stridor/shortness of breath: I do not palpate a a cervical hematoma on physical exam, although this can be difficult to pick up on physical exam.  I am going to transfer her to the ICU and I have asked critical care, 05/29/2021, to see the patient.  I am also will get a stat cervical CT to rule out hematoma.  I have spoken to the patient and her husband and answered all of their questions.  LOS: 1 day     Brett Canales 03/29/2021, 5:01 PM

## 2021-03-29 NOTE — Significant Event (Addendum)
Rapid Response Event Note   Initial Focused Assessment:  Pt sitting, AO. Upper airway stridor noted. Voice sounds clear. She is able to speak, but sounds short of breath when she is speaking. No obvious signs of swelling noted at surgical site. JP drain with drainage, no significant output. Pt endorses feeling short of breath. Spot checking of SpO2 92-97% on 2LNC.   VS: T 98.8, BP 132/67, HR 81, RR 18, SpO2 96% on 2LNC  Interventions:  -CXR, 4mg  IV Decadron given prior to my arrival -Additional order received for 6mg  IV Decadron  Plan of Care:  -Dr. to bedside to evaluate, decision made to transfer pt to ICU  Event Summary:  MD Notified: Dr. , RN

## 2021-03-29 NOTE — Progress Notes (Signed)
RT setup patient homes CPAP at bedside.  Sterile water added to water chamber.

## 2021-03-29 NOTE — Progress Notes (Signed)
eLink Physician-Brief Progress Note Patient Name: Courtney [name] DOB: 09/21/1946 MRN: [id number]   Date of Service  03/29/2021  HPI/Events of Note  Patient had ACDF of C3 - C6 cervical spine during this hospitalization, and earlier today developed post-operative stridor and difficulty breathing, she was transferred to the ICU for closer monitoring although her CT soft tissues of the neck was not suggestive of clinically significant hematoma or edema at the operative site, and her symptoms improved with steroid Rx.  eICU Interventions  New Patient Evaluation.        Thomasene Lot [name] 03/29/2021, 9:23 PM

## 2021-03-29 NOTE — Evaluation (Signed)
Physical Therapy Evaluation Patient Details Name: MARGY SUMLER MRN: [id number] DOB: Apr 09, 1946 Today's Date: 03/29/2021   History of Present Illness  Pt is a 75 y/o female who presents s/p C3-C6 ACDF on 03/28/2021. PMH significant for bipolar disorder, HTN, mitral valve prolapse, claustrophobia, CVA, vertigo, PE, prior back surgery, B knee replacements.   Clinical Impression  Pt admitted with above diagnosis, and seen in conjunction with OT due to nursing report of knee buckling and difficulty mobilizing last night. At the time of PT eval, pt was able to demonstrate transfers and ambulation with gross min assist and RW for support. She anticipates d/c to SNF where she will be able to undergo short term rehab in hopes of returning to her ALF residence with her husband. Pt was educated on precautions, brace application/wearing schedule, and positioning recommendations. Pt currently with functional limitations due to the deficits listed below (see PT Problem List). Pt will benefit from skilled PT to increase their independence and safety with mobility to allow discharge to the venue listed below.      Follow Up Recommendations SNF;Supervision for mobility/OOB    Equipment Recommendations  Rolling walker with 5" wheels    Recommendations for Other Services       Precautions / Restrictions Precautions Precautions: Fall;Cervical Precaution Booklet Issued: Yes (comment) Precaution Comments: Reviewed a portion of the handout, and pt was cued for precautions during functional mobility. Required Braces or Orthoses: Cervical Brace Cervical Brace: Hard collar (Per MD able to take it off as needed due to claustrophobia.) Restrictions Weight Bearing Restrictions: No      Mobility  Bed Mobility Overal bed mobility: Needs Assistance Bed Mobility: Rolling;Sidelying to Sit;Sit to Sidelying Rolling: Min assist Sidelying to sit: Min guard     Sit to sidelying: Min assist General bed mobility  comments: Pt reaching for therapist's hand to roll and required min assist for full roll onto R side. Pt able to elevate trunk to full sitting position with close guard for safety and proper maintenance of precautions. Assist required for LE elevation back up into bed at end of session.    Transfers Overall transfer level: Needs assistance Equipment used: Rolling walker (2 wheeled) Transfers: Sit to/from Stand Sit to Stand: Min assist         General transfer comment: Assist for balance support and to power-up to full standing position. VC's for hand placement on seated surface for safety.  Ambulation/Gait Ambulation/Gait assistance: Min guard Gait Distance (Feet): 25 Feet Assistive device: Rolling walker (2 wheeled) Gait Pattern/deviations: Step-through pattern;Decreased stride length;Trunk flexed Gait velocity: Decreased Gait velocity interpretation: <1.31 ft/sec, indicative of household ambulator General Gait Details: Very slow and guarded. Nauseated throughout ambulation in room, requiring frequent standing rest breaks.  Stairs            Wheelchair Mobility    Modified Rankin (Stroke Patients Only)       Balance Overall balance assessment: Needs assistance Sitting-balance support: No upper extremity supported;Feet supported Sitting balance-Leahy Scale: Fair     Standing balance support: Bilateral upper extremity supported Standing balance-Leahy Scale: Poor Standing balance comment: Reliant on UE support throughout functional mobility.                             Pertinent Vitals/Pain Pain Assessment: Faces Faces Pain Scale: Hurts little more Pain Location: Incision site/neck Pain Descriptors / Indicators: Operative site guarding Pain Intervention(s): Limited activity within patient's tolerance;Monitored during session;Repositioned  Home Living Family/patient expects to be discharged to:: Skilled nursing facility Living Arrangements:  Spouse/significant other Available Help at Discharge: Family;Available 24 hours/day           Home Equipment: Walker - 4 wheels;Wheelchair - manual Additional Comments: Lives at [facility] ALF and plans on going to SNF rehab section at d/c    Prior Function Level of Independence: Needs assistance   Gait / Transfers Assistance Needed: Shelda Altes f rollator with supervision  ADL's / Homemaking Assistance Needed: Assist for ADLs as needed  Comments: Uses a rollator during the day and husband assist with a wheelchair at night to get her to the bathroom. (Simultaneous filing. User may not have seen previous data.)     Hand Dominance   Dominant Hand: Right    Extremity/Trunk Assessment   Upper Extremity Assessment Upper Extremity Assessment: Defer to OT evaluation    Lower Extremity Assessment Lower Extremity Assessment: Generalized weakness    Cervical / Trunk Assessment Cervical / Trunk Assessment: Other exceptions Cervical / Trunk Exceptions: s/p surgery  Communication   Communication: No difficulties  Cognition Arousal/Alertness: Awake/alert Behavior During Therapy: WFL for tasks assessed/performed Overall Cognitive Status: Within Functional Limits for tasks assessed                                        General Comments General comments (skin integrity, edema, etc.): Reports nausea    Exercises     Assessment/Plan    PT Assessment Patient needs continued PT services  PT Problem List Decreased strength;Decreased balance;Decreased activity tolerance;Decreased mobility;Decreased knowledge of use of DME;Decreased knowledge of precautions;Decreased safety awareness;Pain       PT Treatment Interventions DME instruction;Gait training;Functional mobility training;Therapeutic activities;Therapeutic exercise;Neuromuscular re-education;Patient/family education    PT Goals (Current goals can be found in the Care Plan section)  Acute Rehab PT Goals Patient  Stated Goal: To SNF to be able to return to ALF with husband PT Goal Formulation: With patient Time For Goal Achievement: 04/05/21 Potential to Achieve Goals: Good    Frequency Min 5X/week   Barriers to discharge        Co-evaluation PT/OT/SLP Co-Evaluation/Treatment: Yes Reason for Co-Treatment: For patient/therapist safety;To address functional/ADL transfers PT goals addressed during session: Mobility/safety with mobility;Balance;Proper use of DME OT goals addressed during session: ADL's and self-care       AM-PAC PT "6 Clicks" Mobility  Outcome Measure Help needed turning from your back to your side while in a flat bed without using bedrails?: A Little Help needed moving from lying on your back to sitting on the side of a flat bed without using bedrails?: A Little Help needed moving to and from a bed to a chair (including a wheelchair)?: A Little Help needed standing up from a chair using your arms (e.g., wheelchair or bedside chair)?: A Little Help needed to walk in hospital room?: A Little Help needed climbing 3-5 steps with a railing? : A Little 6 Click Score: 18    End of Session Equipment Utilized During Treatment: Gait belt Activity Tolerance: Other (comment) (Limited by nausea) Patient left: in bed;with call bell/phone within reach (Taking breathing treatment) Nurse Communication: Mobility status PT Visit Diagnosis: Unsteadiness on feet (R26.81);Pain Pain - part of body:  (neck)    Time: 0998-3382 PT Time Calculation (min) (ACUTE ONLY): 30 min   Charges:   PT Evaluation $PT Eval Low Complexity: 1 Low  Conni Slipper, PT, DPT Acute Rehabilitation Services Pager: [phone] Office: [phone]   Marylynn Pearson 03/29/2021, 9:07 AM

## 2021-03-30 LAB — BASIC METABOLIC PANEL
Anion gap: 5 (ref 5–15)
BUN: 14 mg/dL (ref 8–23)
CO2: 27 mmol/L (ref 22–32)
Calcium: 9.6 mg/dL (ref 8.9–10.3)
Chloride: 98 mmol/L (ref 98–111)
Creatinine, Ser: 0.84 mg/dL (ref 0.44–1.00)
GFR, Estimated: >60 mL/min (ref >60)
Glucose, Bld: 137 mg/dL — ABNORMAL HIGH (ref 70–99)
Potassium: 5.4 mmol/L — ABNORMAL HIGH (ref 3.5–5.1)
Sodium: 130 mmol/L — ABNORMAL LOW (ref 135–145)

## 2021-03-30 LAB — CBC
HCT: 36.1 % (ref 36.0–46.0)
Hemoglobin: 11.4 g/dL — ABNORMAL LOW (ref 12.0–15.0)
MCH: 29.2 pg (ref 26.0–34.0)
MCHC: 31.6 g/dL (ref 30.0–36.0)
MCV: 92.6 fL (ref 80.0–100.0)
Platelets: 212 10*3/uL (ref 150–400)
RBC: 3.9 MIL/uL (ref 3.87–5.11)
RDW: 13.2 % (ref 11.5–15.5)
WBC: 23.7 10*3/uL — ABNORMAL HIGH (ref 4.0–10.5)
nRBC: 0 % (ref 0.0–0.2)

## 2021-03-30 LAB — URINE CULTURE: Culture: NO GROWTH

## 2021-03-30 MED ORDER — SODIUM CHLORIDE 0.9% FLUSH: 10.0000 mL | INTRAVENOUS | Status: DC | PRN

## 2021-03-30 MED ORDER — SODIUM CHLORIDE 0.9% FLUSH: 10.0000 mL | Freq: Two times a day (BID) | INTRAVENOUS | Status: DC

## 2021-03-30 MED ORDER — ORAL CARE MOUTH RINSE
15.0000 mL | Freq: Two times a day (BID) | OROMUCOSAL | Status: DC
  Administered 2021-03-30 – 2021-04-06 (×15): 15 mL via OROMUCOSAL

## 2021-03-30 MED ORDER — SODIUM ZIRCONIUM CYCLOSILICATE 10 G PO PACK
10.0000 g | PACK | Freq: Once | ORAL | Status: AC
  Administered 2021-03-30: 10 g via ORAL
  Filled 2021-03-30: qty 1

## 2021-03-30 MED ORDER — PANTOPRAZOLE SODIUM 40 MG PO TBEC
40.0000 mg | DELAYED_RELEASE_TABLET | Freq: Every day | ORAL | Status: DC
  Administered 2021-03-30 – 2021-04-06 (×7): 40 mg via ORAL
  Filled 2021-03-30 (×7): qty 1

## 2021-03-30 NOTE — Progress Notes (Signed)
Subjective: The patient is alert and pleasant.  She looks and feels much better.  Objective: Vital signs in last 24 hours: Temp:  [97.7 F (36.5 C)-99.5 F (37.5 C)] 97.9 F (36.6 C) (06/10 0400) Pulse Rate:  [67-87] 67 (06/10 0600) Resp:  [14-18] 15 (06/10 0600) BP: (119-142)/(56-92) 123/75 (06/10 0600) SpO2:  [90 %-96 %] 95 % (06/10 0600) Estimated body mass index is 40.48 kg/m as calculated from the following:   Height as of this encounter: 5\' 2"  (1.575 m).   Weight as of this encounter: 100.4 kg.   Intake/Output from previous day: 06/09 0701 - 06/10 0700 In: 530.8 [P.O.:240; I.V.:290.8] Out: 1620 [Urine:1550; Drains:70] Intake/Output this shift: Total I/O In: 290.8 [I.V.:290.8] Out: 1165 [Urine:1150; Drains:15]  Physical exam the patient is alert and oriented.  Her strength is normal.  Her dressing is clean and dry without hematoma or shift.  I remove the drain.  I have reviewed the patient's neck CT.  It demonstrates minimal swelling no evidence of significant hematoma.  Lab Results: Recent Labs    03/30/21 0303  WBC 23.7*  HGB 11.4*  HCT 36.1  PLT 212   BMET Recent Labs    03/30/21 0303  NA 130*  K 5.4*  CL 98  CO2 27  GLUCOSE 137*  BUN 14  CREATININE 0.84  CALCIUM 9.6    Studies/Results: DG Cervical Spine 2 or 3 views  Result Date: 03/28/2021 CLINICAL DATA:  Cervical ACDF EXAM: CERVICAL SPINE - 2-3 VIEW COMPARISON:  12/12/2020 FINDINGS: Image number 1 at 0925 hours: Metallic probe via anterior approach projects over the anterior aspect of the C3-C4 disc space. Image 2 at 1114 hours: Anterior plate and screws extending from C4-C6. Intervening disc prostheses. Surgical sponges anterior to the surgical levels. IMPRESSION: Intraoperative images during anterior cervical spine fusion. Electronically Signed   By: 12/14/2020 M.D.   On: 03/28/2021 16:07   CT SOFT TISSUE NECK WO CONTRAST  Result Date: 03/29/2021 CLINICAL DATA:  Anterior cervical discectomy  with subsequent stridor. EXAM: CT NECK WITHOUT CONTRAST TECHNIQUE: Multidetector CT imaging of the neck was performed following the standard protocol without intravenous contrast. COMPARISON:  Operative radiography 03/28/2021. Cervical CT 02/18/2021 FINDINGS: Previous left neck approach for anterior cervical discectomy and fusion C3 through C6. Hardware grossly well positioned. Soft tissue drain remains in place anterior to the region of surgical fusion. There is what I would consider to be an ordinary amount of postoperative swelling in the neck. The airway does not appear significantly narrowed in that region. There is no evidence of a deep space hematoma or abscess. The trachea in the upper mediastinum is displaced slightly by the ectatic aorta and there may be a small amount of fluid in the trachea but it does not appear physically constricted otherwise. IMPRESSION: No definite complicating feature identified. ACDF C3 through C6. Soft tissue drain in place. No evidence of a deep space hematoma or other collection. The airway in the neck appears largely uncompromised. The trachea is displaced slightly to the right by the ectatic aortic arch and there may be a small amount of fluid or wall edema of the trachea in that region. There is some possibility that this could be swelling due to the previous presence of the endotracheal tube in that location. Electronically Signed   By: 04/20/2021 M.D.   On: 03/29/2021 18:50   DG CHEST PORT 1 VIEW  Result Date: 03/29/2021 CLINICAL DATA:  Wheezing EXAM: PORTABLE CHEST 1 VIEW COMPARISON:  None. FINDINGS: There is consolidation of left lung base. Elevation of right hemidiaphragm is noted. There is no pulmonary edema. Heart size is probably enlarged. Status post prior fixation of cervical spine. IMPRESSION: Consolidation of left lung base, pneumonia suspected. Electronically Signed   By: Sherian Rein M.D.   On: 03/29/2021 16:08    Assessment/Plan: Postop day #2: The  patient is doing better.  I will transfer her to the progressive unit.  We are awaiting discharge to [facility] rehab.  LOS: 2 days     Cristi Loron 03/30/2021, 6:35 AM

## 2021-03-30 NOTE — Progress Notes (Signed)
PCCM Progress Note   No acute events overnight, patient continues to have mild expiratory upper airway wheeze but is having no respiratory distress and patient reports she feels well this morning.  Primary care service has already rounded and is transferring patient to progressive care unit.  No further identified critical care needs therefore PCCM will sign off.  Please consult if any further assistance is needed.  Thank you for the opportunity to care for such a pleasant patient.  Delfin Gant, NP-C [facility] Personal contact information can be found on Amion  03/30/2021, 7:47 AM

## 2021-03-30 NOTE — Progress Notes (Signed)
Physical Therapy Treatment Patient Details Name: Courtney [name] MRN: [id number] DOB: October 20, 1946 Today's Date: 03/30/2021    History of Present Illness Pt is a 75 y/o female who presents s/p C3-C6 ACDF on 03/28/2021. on 6/9 pt with new onset stridor and difficulty breathing, required 2L O2 and transferred to ICU. PMH significant for bipolar disorder, HTN, mitral valve prolapse, claustrophobia, CVA, vertigo, PE, prior back surgery, B knee replacements.    PT Comments    The pt was able to demo good mobility this session given events of yesterday. She was able to complete bed mobility with minA to maintain log roll, and complete initial transfers/gait in the room with minG and BUE support. The pt had no overt LOB, but does present with increased SOB and fatigue with short bouts of activity at this time. SpO2 remained steady on 3L O2. The pt remains highly motivated to improve activity and return to independence, continue to recommend short stint rehab prior to return home.     Follow Up Recommendations  SNF;Supervision for mobility/OOB     Equipment Recommendations  Rolling walker with 5" wheels    Recommendations for Other Services       Precautions / Restrictions Precautions Precautions: Fall;Cervical Precaution Booklet Issued: Yes (comment) Precaution Comments: pt able to complete log roll with cues Required Braces or Orthoses: Cervical Brace Cervical Brace: Hard collar (Per MD able to take it off as needed due to claustrophobia.) Restrictions Weight Bearing Restrictions: No Other Position/Activity Restrictions: L wrist splint for any wt bearing through L wrist, pt with prior fx    Mobility  Bed Mobility Overal bed mobility: Needs Assistance Bed Mobility: Rolling;Sidelying to Sit Rolling: Min guard Sidelying to sit: Min assist       General bed mobility comments: pt able to complete with minG and verbal cues for technique, minA to complete elvation of trunk from [facility]     Transfers Overall transfer level: Needs assistance Equipment used: Rolling walker (2 wheeled) Transfers: Sit to/from Stand Sit to Stand: Min guard         General transfer comment: minG with BUE support on RW, cues for hand position. no LOB with initial stand  Ambulation/Gait Ambulation/Gait assistance: Min guard Gait Distance (Feet): 15 Feet (+ 20 ft + 6 ft) Assistive device: Rolling walker (2 wheeled) Gait Pattern/deviations: Step-through pattern;Decreased stride length;Trunk flexed Gait velocity: Decreased   General Gait Details: slow but steady this session, SpO2 89-94% on 3L O2          Balance Overall balance assessment: Needs assistance Sitting-balance support: No upper extremity supported;Feet supported Sitting balance-Leahy Scale: Fair     Standing balance support: Bilateral upper extremity supported Standing balance-Leahy Scale: Poor Standing balance comment: Reliant on UE support throughout functional mobility.                            Cognition Arousal/Alertness: Awake/alert Behavior During Therapy: WFL for tasks assessed/performed Overall Cognitive Status: Within Functional Limits for tasks assessed                                        Exercises      General Comments General comments (skin integrity, edema, etc.): SpO2 88-94% on 3L O2      Pertinent Vitals/Pain Pain Assessment: Faces Faces Pain Scale: Hurts little more Pain Location: Incision site/neck Pain Descriptors /  Indicators: Operative site guarding Pain Intervention(s): Limited activity within patient's tolerance;Monitored during session;Repositioned     PT Goals (current goals can now be found in the care plan section) Acute Rehab PT Goals Patient Stated Goal: To SNF to be able to return to ALF with husband PT Goal Formulation: With patient Time For Goal Achievement: 04/05/21 Potential to Achieve Goals: Good Progress towards PT goals: Progressing  toward goals    Frequency    Min 5X/week      PT Plan Current plan remains appropriate       AM-PAC PT "6 Clicks" Mobility   Outcome Measure  Help needed turning from your back to your side while in a flat bed without using bedrails?: A Little Help needed moving from lying on your back to sitting on the side of a flat bed without using bedrails?: A Little Help needed moving to and from a bed to a chair (including a wheelchair)?: A Little Help needed standing up from a chair using your arms (e.g., wheelchair or bedside chair)?: A Little Help needed to walk in hospital room?: A Little Help needed climbing 3-5 steps with a railing? : A Little 6 Click Score: 18    End of Session Equipment Utilized During Treatment: Gait belt Activity Tolerance: Patient tolerated treatment well Patient left: in chair;with call bell/phone within reach;with chair alarm set;with family/visitor present Nurse Communication: Mobility status PT Visit Diagnosis: Unsteadiness on feet (R26.81);Pain     Time: 0973-5329 PT Time Calculation (min) (ACUTE ONLY): 41 min  Charges:  $Gait Training: 23-37 mins $Therapeutic Activity: 8-22 mins                     Courtney [name], PT, DPT   Acute Rehabilitation Department Pager #: [phone]   Courtney [name] 03/30/2021, 11:00 AM

## 2021-03-31 NOTE — Progress Notes (Signed)
Overall stable.  Still with some mild stridor.  Patient denies any respiratory distress.  She is wide awake and oriented.  Her motor and sensory function are stable.  Her neck is soft.  Airway is midline.  Status post anterior cervical discectomy and fusion with upper airway constriction.  No evidence of significant postoperative hemorrhage.  Continue ICU observation as long as stridor remains.  Okay to mobilize out of bed.

## 2021-03-31 NOTE — Progress Notes (Signed)
Occupational Therapy Treatment Patient Details Name: Courtney [name] MRN: [id number] DOB: 07/19/1946 Today's Date: 03/31/2021    History of present illness Pt is a 75 y/o female who presents s/p C3-C6 ACDF on 03/28/2021. on 6/9 pt with new onset stridor and difficulty breathing, required 2L O2 and transferred to ICU. PMH significant for bipolar disorder, HTN, mitral valve prolapse, claustrophobia, CVA, vertigo, PE, prior back surgery, B knee replacements.   OT comments  Pt progressing toward OT goals. Pt requiring Min A for bed mobility and Max A to don socks with sock aid due to fatigue. Pt requiring min G for functional mobility to bathroom and Min A to power up from toilet. Pt requiring seated rest breaks and cues to implement pursed lip breathing throughout session. Pt motivated to participate in therapy. Continue to recommend discharge to SNF and will continue to follow acutely to optimize activity tolerance and independence in ADLs.   SpO2 96 upon arrival on 3L O2; SpO2 dropping to 80's with activity, however, poor PLETH line. SpO2 improving to 92 with seated rest break and increasing O2 to 4L. Pt left on 3L O2 and SpO2 at 96.   Follow Up Recommendations  SNF    Equipment Recommendations  None recommended by OT    Recommendations for Other Services      Precautions / Restrictions Precautions Precautions: Fall;Cervical;Other (comment) Precaution Comments: Monitor SpO2. Pt able to recall precautions. Required Braces or Orthoses: Cervical Brace Cervical Brace: Hard collar Restrictions Weight Bearing Restrictions: No Other Position/Activity Restrictions: L wrist splint for any wt bearing through L wrist, pt with prior fx       Mobility Bed Mobility Overal bed mobility: Needs Assistance Bed Mobility: Rolling;Sidelying to Sit Rolling: Min assist Sidelying to sit: Min assist       General bed mobility comments: Min A to roll and for elevation of trunk. Requiring verbal cues for  log roll technique.    Transfers Overall transfer level: Needs assistance Equipment used: Rolling walker (2 wheeled) Transfers: Sit to/from Stand Sit to Stand: Min guard;Min assist         General transfer comment: Min Guard up from EOB and Min A for toilet transfer.    Balance Overall balance assessment: Needs assistance Sitting-balance support: No upper extremity supported;Feet supported Sitting balance-Leahy Scale: Fair Sitting balance - Comments: Pt with R lateral lean in sitting, requiring cues to maintain upright position Postural control: Right lateral lean Standing balance support: Bilateral upper extremity supported;During functional activity Standing balance-Leahy Scale: Poor Standing balance comment: Reliant on UE support throughout functional mobility.                           ADL either performed or assessed with clinical judgement   ADL Overall ADL's : Needs assistance/impaired                     Lower Body Dressing: Bed level;Maximal assistance Lower Body Dressing Details (indicate cue type and reason): Pt educated and requiring max A to don socks using sock aid due to fatgue. Toilet Transfer: Minimal assistance;Ambulation;RW;Regular Toilet;Grab bars Toilet Transfer Details (indicate cue type and reason): Pt completing toilet transfer and sit to/from stand for toilet hygiene x3. Pt fatiguing with transfers and requiring rest breaks between each and verbal cues for pursed lip breathing. Toileting- Clothing Manipulation and Hygiene: Moderate assistance;Sit to/from stand Toileting - Clothing Manipulation Details (indicate cue type and reason): Pt completing anterior peri  care seated with min guard A and requiring assist to complete posterior pericare.     Functional mobility during ADLs: Min guard;Rolling walker General ADL Comments: Pt presenting with decreased balance and activity tolerance. Requiring 2-3 minute rest breaks between activities  and transfers.     Vision   Vision Assessment?: No apparent visual deficits   Perception     Praxis      Cognition Arousal/Alertness: Awake/alert Behavior During Therapy: WFL for tasks assessed/performed Overall Cognitive Status: Within Functional Limits for tasks assessed                                 General Comments: Pt with difficulty sequencing and organizing thoughts with fatigue.        Exercises     Shoulder Instructions       General Comments SpO2 96 upon arrival on 3L O2; SpO2 dropping to 80's with activity, however, poor PLETH line. SpO2 improving to 92 with seated rest break and increasing O2 to 4L. Pt left on 3L O2 and SpO2 at 96. Noting SOB with activity.    Pertinent Vitals/ Pain       Pain Assessment: Faces Faces Pain Scale: Hurts a little bit Pain Location: Incision site/neck Pain Intervention(s): Limited activity within patient's tolerance;Monitored during session  Home Living                                          Prior Functioning/Environment              Frequency  Min 2X/week        Progress Toward Goals  OT Goals(current goals can now be found in the care plan section)  Progress towards OT goals: Progressing toward goals  Acute Rehab OT Goals Patient Stated Goal: To SNF to be able to return to ALF with husband OT Goal Formulation: With patient Time For Goal Achievement: 04/12/21 Potential to Achieve Goals: Good ADL Goals Pt Will Perform Grooming: with supervision;standing Pt Will Perform Upper Body Dressing: with min assist;sitting Pt Will Perform Lower Body Dressing: with min assist;with adaptive equipment;sit to/from stand Pt Will Transfer to Toilet: with supervision;ambulating;regular height toilet Pt Will Perform Toileting - Clothing Manipulation and hygiene: with supervision;sitting/lateral leans Additional ADL Goal #1: Pt will recall 3/3 precautions independently in preparation for ADLs   Plan Discharge plan remains appropriate    Co-evaluation                 AM-PAC OT "6 Clicks" Daily Activity     Outcome Measure   Help from another person eating meals?: A Little Help from another person taking care of personal grooming?: A Little Help from another person toileting, which includes using toliet, bedpan, or urinal?: A Little Help from another person bathing (including washing, rinsing, drying)?: A Lot Help from another person to put on and taking off regular upper body clothing?: A Lot Help from another person to put on and taking off regular lower body clothing?: A Lot 6 Click Score: 15    End of Session Equipment Utilized During Treatment: Rolling walker  OT Visit Diagnosis: Unsteadiness on feet (R26.81);Other abnormalities of gait and mobility (R26.89);Muscle weakness (generalized) (M62.81)   Activity Tolerance Patient tolerated treatment well   Patient Left in chair;with call bell/phone within reach;with chair alarm set   Nurse  Communication Mobility status        Time: 1340-1420 OT Time Calculation (min): 40 min  Charges: OT General Charges $OT Visit: 1 Visit OT Treatments $Self Care/Home Management : 38-52 mins  Ladene Artist, OTDS    Ladene Artist 03/31/2021, 3:43 PM

## 2021-04-01 ENCOUNTER — Inpatient Hospital Stay (HOSPITAL_COMMUNITY): Payer: Medicare HMO

## 2021-04-01 LAB — BLOOD GAS, ARTERIAL
Acid-Base Excess: 7.2 mmol/L — ABNORMAL HIGH (ref 0.0–2.0)
Bicarbonate: 31.9 mmol/L — ABNORMAL HIGH (ref 20.0–28.0)
Drawn by: 441661
FIO2: 32
O2 Saturation: 94.4 %
Patient temperature: 36.9
pCO2 arterial: 51.4 mmHg — ABNORMAL HIGH (ref 32.0–48.0)
pH, Arterial: 7.409 (ref 7.350–7.450)
pO2, Arterial: 65.8 mmHg — ABNORMAL LOW (ref 83.0–108.0)

## 2021-04-01 MED ORDER — RACEPINEPHRINE HCL 2.25 % IN NEBU
INHALATION_SOLUTION | RESPIRATORY_TRACT | Status: AC
  Administered 2021-04-01: 0.5 mL
  Filled 2021-04-01: qty 0.5

## 2021-04-01 MED ORDER — DEXAMETHASONE SODIUM PHOSPHATE 4 MG/ML IJ SOLN
4.0000 mg | Freq: Four times a day (QID) | INTRAMUSCULAR | Status: DC
  Administered 2021-04-02 (×2): 4 mg via INTRAVENOUS
  Filled 2021-04-01 (×2): qty 1

## 2021-04-01 MED ORDER — RACEPINEPHRINE HCL 2.25 % IN NEBU: 0.5000 mL | INHALATION_SOLUTION | RESPIRATORY_TRACT | Status: DC | PRN

## 2021-04-01 NOTE — Evaluation (Signed)
Clinical/Bedside Swallow Evaluation Patient Details  Name: Courtney [name] MRN: [id number] Date of Birth: May 12, 1946  Today's Date: 04/01/2021 Time: SLP Start Time (ACUTE ONLY): 1312 SLP Stop Time (ACUTE ONLY): 1335 SLP Time Calculation (min) (ACUTE ONLY): 23 min  Past Medical History:  Past Medical History:  Diagnosis Date   Anemia    Arthritis    Bilateral Wrists   Asthma    Baker's cyst of knee    Bipolar disorder (HCC)    Complication of anesthesia    Diverticulitis    Dyspnea    Headache    Hiatal hernia    History of kidney stones 2006   Hypertension    Kidney stone    Mitral valve prolapse    Panic attack    Phlebitis    PONV (postoperative nausea and vomiting)    Pulmonary embolus (HCC)    Sleep apnea    Spastic colon    Stroke [facility])    Vertigo    Past Surgical History:  Past Surgical History:  Procedure Laterality Date   ABDOMINAL HYSTERECTOMY     ANTERIOR CERVICAL DECOMP/DISCECTOMY FUSION N/A 03/28/2021   Procedure: Cervical three-four Cervical four-five Cervical five-six Anterior cervical decompression/discectomy/fusion/interbody prosthesis/plate/screws;  Surgeon: Tressie Stalker, MD;  Location: [facility] OR;  Service: Neurosurgery;  Laterality: N/A;   BACK SURGERY     BREAST REDUCTION SURGERY     CHOLECYSTECTOMY     EYE SURGERY Bilateral 2000   Cataract   JOINT REPLACEMENT Bilateral    Knee replacement   KNEE SURGERY     KNEE SURGERY     TOE SURGERY     TONSILLECTOMY     HPI:      Assessment / Plan / Recommendation Clinical Impression  Patient presents with evidence of dysphagia and decreased airway protection characterized by multiple swift swallows per bolus suggestive of pharyngeal residue followed by throat clearing and eventual cough post swallow. Suspect post op pharyngeal edema contributing to decreased glottal closure, decreased epiglottic deflection, and abiility to fully clear pharynx and protect airway. Recommend NPO except meds crushed in puree  and small sip/chips H2O only after oral care. MBS to be completed in am.  SLP Visit Diagnosis: Dysphagia, pharyngeal phase (R13.13)    Aspiration Risk       Diet Recommendation NPO except meds;Ice chips PRN after oral care   Medication Administration: Crushed with puree    Other  Recommendations Oral Care Recommendations: Oral care QID;Oral care prior to ice chip/H20   Follow up Recommendations Other (comment) (TBD)        Swallow Study   General Type of Study: MBS-Modified Barium Swallow Study Previous Swallow Assessment: complete in [place], no penetration or aspiration identified Diet Prior to this Study: NPO Temperature Spikes Noted: Yes Respiratory Status: Nasal cannula History of Recent Intubation:  (surgery only) Behavior/Cognition: Lethargic/Drowsy Oral Cavity Assessment: Dry Oral Care Completed by SLP: Yes Oral Cavity - Dentition: Adequate natural dentition Vision: Functional for self-feeding Self-Feeding Abilities: Able to feed self Patient Positioning: Upright in bed Baseline Vocal Quality: Breathy;Hoarse;Low vocal intensity Volitional Cough: Weak Volitional Swallow: Able to elicit    Oral/Motor/Sensory Function Overall Oral Motor/Sensory Function: Generalized oral weakness   Ice Chips Ice chips: Impaired Presentation: Spoon Pharyngeal Phase Impairments: Multiple swallows;Throat Clearing - Immediate;Cough - Delayed   Thin Liquid Thin Liquid: Not tested    Nectar Thick Nectar Thick Liquid: Not tested   Honey Thick Honey Thick Liquid: Not tested   Puree Puree: Impaired Presentation: Spoon Pharyngeal Phase  Impairments: Multiple swallows;Throat Clearing - Delayed   Solid     Solid: Not tested     Ferdinand Lango MA, CCC-SLP  Laurielle Selmon Meryl 04/01/2021,1:44 PM

## 2021-04-01 NOTE — Progress Notes (Signed)
Patient had an episode of nausea and vomiting, elevated respiration rate. MD ( Neurosurgery) notified. Respiratory notified.  Stridor more pronounced. Awaiting response from MD. Will continue monitoring.

## 2021-04-01 NOTE — Progress Notes (Signed)
Patient with a number of issues ongoing.  She continues to be unable to void without In-N-Out catheterization.  She still has significant stridor and dysphagia.  She is in no respiratory distress however.  She is having some low-grade temperature elevations.  Her vital signs are otherwise stable.  She is awake and alert.  She appears mildly uncomfortable.  There is no swelling in her neck.  She still has some stridorous respirations but she is in no distress.  Her chest sounds reasonably clear bilaterally.  Her abdomen is soft.  Her motor and sensory function of her extremities are normal.  I am concerned the patient is starting to aspirate.  Like to get speech therapy involved to see her.  We will get some x-rays today.  Make her n.p.o. for now.  Plan on restarting some IV fluids.

## 2021-04-01 NOTE — Progress Notes (Signed)
ABG collected and sent to the lab 

## 2021-04-01 NOTE — Progress Notes (Signed)
Patient refuse NIV for the night 

## 2021-04-01 NOTE — Progress Notes (Signed)
Called to the bedside for assessment of patient. On arrival noted respiratory compromise in the setting of sustainable oxygen desaturation, increase WOB and moderate expiratory stridor noted. No inspiratory stridor noted upon auscultation of patient lateral neck. Clinical presentation patient appears weak w/ general malaise. Race-Epi given with minimal relief. Patient still presenting with increase WOB. Patient states that she now has chest pain of her left side. Made RN aware. Prior to my arrival learned that patient had vomited, patient states that she does not feel well. NRB placed on patient due to desaturation in the low 80's with a good pleth. Provider was paged per RN.   HR: 90 RR: 26 SpO2: 96 on NRB 100% BP: 138/66mmhg Exp. Stridor noted.  Modelle Vollmer L. Katrinka Blazing, BS, RRT-ACCS, RCP

## 2021-04-01 NOTE — Progress Notes (Signed)
Patient unable to void. No urine out put noted for  6hrs. Bladder scan performed, and a total 550 ml of urine scanned,In and out cath performed and a total of 600 ml of urine drained. Post in and out scan indicated a total of 20 ml residual of urine.

## 2021-04-01 NOTE — Progress Notes (Signed)
Patient is having an onset of confusion/change in mental status upon assessment. Patient wanting to get out of bed and go back to her house. Neurosurgery ,contacted with regards to this new development. ABG lab order given. Will continue monitoring.

## 2021-04-02 ENCOUNTER — Encounter (HOSPITAL_COMMUNITY): Payer: Self-pay | Admitting: Neurosurgery

## 2021-04-02 ENCOUNTER — Encounter: Payer: Self-pay | Admitting: Internal Medicine

## 2021-04-02 ENCOUNTER — Inpatient Hospital Stay (HOSPITAL_COMMUNITY): Payer: Medicare HMO

## 2021-04-02 DIAGNOSIS — E039 Hypothyroidism, unspecified: Secondary | ICD-10-CM

## 2021-04-02 DIAGNOSIS — G4733 Obstructive sleep apnea (adult) (pediatric): Secondary | ICD-10-CM

## 2021-04-02 DIAGNOSIS — E785 Hyperlipidemia, unspecified: Secondary | ICD-10-CM | POA: Diagnosis present

## 2021-04-02 DIAGNOSIS — I10 Essential (primary) hypertension: Secondary | ICD-10-CM | POA: Insufficient documentation

## 2021-04-02 DIAGNOSIS — J690 Pneumonitis due to inhalation of food and vomit: Secondary | ICD-10-CM | POA: Diagnosis present

## 2021-04-02 DIAGNOSIS — M4722 Other spondylosis with radiculopathy, cervical region: Secondary | ICD-10-CM | POA: Diagnosis not present

## 2021-04-02 DIAGNOSIS — E871 Hypo-osmolality and hyponatremia: Secondary | ICD-10-CM | POA: Diagnosis present

## 2021-04-02 DIAGNOSIS — M4712 Other spondylosis with myelopathy, cervical region: Secondary | ICD-10-CM

## 2021-04-02 DIAGNOSIS — F319 Bipolar disorder, unspecified: Secondary | ICD-10-CM | POA: Insufficient documentation

## 2021-04-02 HISTORY — DX: Hypothyroidism, unspecified: E03.9

## 2021-04-02 HISTORY — DX: Obstructive sleep apnea (adult) (pediatric): G47.33

## 2021-04-02 LAB — CBC
HCT: 38.4 % (ref 36.0–46.0)
Hemoglobin: 12.6 g/dL (ref 12.0–15.0)
MCH: 29.4 pg (ref 26.0–34.0)
MCHC: 32.8 g/dL (ref 30.0–36.0)
MCV: 89.5 fL (ref 80.0–100.0)
Platelets: 176 10*3/uL (ref 150–400)
RBC: 4.29 MIL/uL (ref 3.87–5.11)
RDW: 12.6 % (ref 11.5–15.5)
WBC: 16.6 10*3/uL — ABNORMAL HIGH (ref 4.0–10.5)
nRBC: 0 % (ref 0.0–0.2)

## 2021-04-02 LAB — BASIC METABOLIC PANEL
Anion gap: 10 (ref 5–15)
BUN: 11 mg/dL (ref 8–23)
CO2: 30 mmol/L (ref 22–32)
Calcium: 9.1 mg/dL (ref 8.9–10.3)
Chloride: 86 mmol/L — ABNORMAL LOW (ref 98–111)
Creatinine, Ser: 0.78 mg/dL (ref 0.44–1.00)
GFR, Estimated: >60 mL/min (ref >60)
Glucose, Bld: 103 mg/dL — ABNORMAL HIGH (ref 70–99)
Potassium: 4.5 mmol/L (ref 3.5–5.1)
Sodium: 126 mmol/L — ABNORMAL LOW (ref 135–145)

## 2021-04-02 MED ORDER — SODIUM CHLORIDE 0.9 % IV SOLN
1.5000 g | Freq: Four times a day (QID) | INTRAVENOUS | Status: DC
  Administered 2021-04-02 – 2021-04-06 (×16): 1.5 g via INTRAVENOUS
  Filled 2021-04-02 (×4): qty 4
  Filled 2021-04-02: qty 1.5
  Filled 2021-04-02 (×14): qty 4

## 2021-04-02 MED ORDER — LORAZEPAM 2 MG/ML IJ SOLN
INTRAMUSCULAR | Status: AC
  Filled 2021-04-02: qty 1

## 2021-04-02 MED ORDER — SODIUM CHLORIDE 0.9 % IV SOLN: INTRAVENOUS | Status: DC

## 2021-04-02 MED ORDER — SODIUM CHLORIDE 0.9 % IV SOLN: 2.0000 g | INTRAVENOUS | Status: DC

## 2021-04-02 MED ORDER — LORAZEPAM 2 MG/ML IJ SOLN
1.0000 mg | Freq: Four times a day (QID) | INTRAMUSCULAR | Status: DC | PRN
  Administered 2021-04-02: 1 mg via INTRAVENOUS

## 2021-04-02 MED ORDER — POTASSIUM CHLORIDE IN NACL 20-0.9 MEQ/L-% IV SOLN
INTRAVENOUS | Status: DC
  Filled 2021-04-02: qty 1000

## 2021-04-02 NOTE — Consult Note (Addendum)
Medical Consultation   Courtney [name]  HER:[id number]  DOB: 11-02-1945  DOA: 03/28/2021  PCP: Georgann Housekeeper, MD   Outpatient Specialists: Lovell Sheehan - neurosurgery    Requesting physician: Lovell Sheehan - neurosurgery  Reason for consultation: ACDF on 6/8.  Has OSA, followed by pulm.  PCCM involved over the weekend, had some improvement.  ?PNA vs. Atelectasis.  Developing fevers, WBC elevated at 16.6.  ABG last night, CO2 elevated, pO2 65.8.  Surgery site looks ok.  CXR concerning.  Also with new hyponatremia, Na++ 126.   History of Present Illness: Courtney [name] is an 75 y.o. female with h/o bipolar; HTN; PE; and OSA who presented on 6/8 for C-spine fusion.  She is too somnolent to answer questions but did open her eyes a couple of times, smile, and state her name.  She also denied the presence of pain.  Her husband reports possible mild baseline cognitive impairment.  She had surgery last week and has had some cough and dysphagia - although she previously also had dysphagia and he is unable to explain whether this previously resolved and what was done about it.  She does not wear home O2 and is currently on 4-5L with improved oxygenation.  She has had some fever.  She was given morphine and ativan last night due to agitation and confusion and she has been sleeping all morning.  She is supposed to have a swallow evaluation but she is not awake enough to be able to participate at this time.     Review of Systems:  ROS Unable to perform   Past Medical History: Past Medical History:  Diagnosis Date   Anemia    Arthritis    Bilateral Wrists   Asthma    Bipolar disorder (HCC)    Diverticulitis    Hiatal hernia    History of kidney stones 2006   Hypertension    Kidney stone    Mitral valve prolapse    Panic attack    PONV (postoperative nausea and vomiting)    Pulmonary embolus (HCC)    Sleep apnea    Stroke [facility])    Vertigo     Past Surgical History: Past  Surgical History:  Procedure Laterality Date   ABDOMINAL HYSTERECTOMY     ANTERIOR CERVICAL DECOMP/DISCECTOMY FUSION N/A 03/28/2021   Procedure: Cervical three-four Cervical four-five Cervical five-six Anterior cervical decompression/discectomy/fusion/interbody prosthesis/plate/screws;  Surgeon: Tressie Stalker, MD;  Location: [facility] OR;  Service: Neurosurgery;  Laterality: N/A;   BACK SURGERY     BREAST REDUCTION SURGERY     CHOLECYSTECTOMY     EYE SURGERY Bilateral 2000   Cataract   JOINT REPLACEMENT Bilateral    Knee replacement   KNEE SURGERY     KNEE SURGERY     TOE SURGERY     TONSILLECTOMY       Allergies:   Allergies  Allergen Reactions   Tetanus-Diphtheria Toxoids Td Anaphylaxis   Meloxicam Nausea And Vomiting   Phentermine Nausea And Vomiting   Sulfa Antibiotics Diarrhea and Nausea And Vomiting   Zithromax [Azithromycin] Diarrhea and Nausea And Vomiting     Social History:  reports that she has never smoked. She has never used smokeless tobacco. She reports previous alcohol use. She reports previous drug use.   Family History: History reviewed. No pertinent family history.    Physical Exam: Vitals:   04/01/21 2301 04/02/21 0340 04/02/21 0738 04/02/21 1200  BP: 138/60 116/63 (!) 146/65 (!) 147/74  Pulse: 90 80 70 67  Resp: 20 (!) 23 16 14   Temp: 98.8 F (37.1 C) 98.5 F (36.9 C) 98.8 F (37.1 C) 97.8 F (36.6 C)  TempSrc: Oral Axillary Axillary Oral  SpO2: 100% 100% 100% 97%  Weight:      Height:        Constitutional: Somnolent, not in any acute distress. Eyes:  irises appear normal, anicteric sclera, eyes mostly closed ENMT: external ears and nose appear normal, normal hearing, Lips appear normal, oropharynx mucosa, tongue appear normal, mildly dry mucus membranes Neck: anterior neck surgical incision appears C/D/I CVS: S1-S2 clear, no murmur rubs or gallops, no LE edema, normal pedal pulses  Respiratory:  clear to auscultation bilaterally, no  wheezing, rales or rhonchi. Respiratory effort normal. No accessory muscle use.  Abdomen: soft nontender, nondistended Musculoskeletal: : no cyanosis, clubbing or edema noted bilaterally Neuro: unable to perform Psych: somnolent, able to report her name before lapsing back to sleep Skin: no rashes or lesions or ulcers, no induration or nodules    Data reviewed:  I have personally reviewed the recent labs and imaging studies  Pertinent Labs:   ABG: 7.409/51.4/65.8/31.9/94% Na++ 126; 130 on 6/10; 134 on 6/6 WBC 16.6, down from 23.7 on 6/20; 6.9 on 6/6 UA WNL on 6/9   Inpatient Medications:   Scheduled Meds:  atorvastatin  80 mg Oral Daily   balanced salts  15 mL Intraocular Once   budesonide  0.25 mg Nebulization BID   buPROPion  150 mg Oral Daily   Chlorhexidine Gluconate Cloth  6 each Topical Daily   cholecalciferol  1,000 Units Oral Daily   darifenacin  15 mg Oral Daily   docusate sodium  100 mg Oral BID   escitalopram  10 mg Oral Daily   gabapentin  100 mg Oral TID   levothyroxine  50 mcg Oral Q0600   LORazepam       magnesium oxide  400 mg Oral Daily   mouth rinse  15 mL Mouth Rinse BID   montelukast  10 mg Oral Daily   pantoprazole  40 mg Oral Daily   psyllium  1 packet Oral Daily   tamsulosin  0.4 mg Oral QPC breakfast   verapamil  360 mg Oral Daily   Continuous Infusions:  0.9 % NaCl with KCl 20 mEq / L 50 mL/hr at 04/02/21 1146     Radiological Exams on Admission: DG CHEST PORT 1 VIEW  Result Date: 04/01/2021 CLINICAL DATA:  Shortness of breath and wheezing. EXAM: PORTABLE CHEST 1 VIEW COMPARISON:  03/29/2021 FINDINGS: Again demonstrated is a poor inspiration. Interval mild enlargement of the cardiac silhouette. Mild-to-moderate peribronchial thickening with mild progression. Mild increase in prominence of the interstitial markings with no Kerley lines seen. No visible pleural fluid. Increased left basilar airspace opacity. Cervical spine fixation hardware.  IMPRESSION: 1. Increased left basilar pneumonia or patchy atelectasis. 2. Progressive bronchitic changes. 3. Interval mild cardiomegaly. Electronically Signed   By: 05/29/2021 M.D.   On: 04/01/2021 13:46    Impression/Recommendations Principal Problem:   Cervical spondylosis with myelopathy and radiculopathy Active Problems:   Aspiration pneumonia of left lower lobe due to regurgitated food (HCC)   Bipolar disorder (HCC)   Hypertension   Hyponatremia  S/p C-spine fusion -Patient seemingly ok from that standpoint but has developed other issues -Neurosurgery is continuing to follow -She and her husband are living at [facility] so she should be able to  get placement there pretty easily when she is stable for d/c  Aspiration PNA -Prior h/o dysphagia reported by husband -Speech evaluation on 6/12 with decreased airway protection, concern for post-op pharyngeal edema - recommended to be NPO -Also scheduled for barium swallow -Currently too somnolent to participate -No evidence of sepsis, but likely aspiration PNA based on data available at this time -Will start Unasyn -Recheck CBC in AM  Hyponatremia -Likely hypovolemic -Will stop KCl replacement (K+ yesterday was 5.4) and change to NS at 75 cc/hour -Recheck BMP in AM  HLD -Resume Lipitor when able to tolerate PO  Mood d/o -Also concern for underlying dementia -Resume Wellbutrin, Lexapro, Ativan when able to take PO  Hypothyroidism -Continue Synthroid at current dose for now   HTN -Hold Benicar, verapamil while NPO -Cover with IV prn hydralazine  OSA -Continue CPAP  Obesity -Body mass index is 40.48 kg/m..  -Weight loss should be encouraged -Resume Ozempic after d/c -Outpatient PCP/bariatric medicine f/u encouraged      Thank you for this consultation.  Our [facility] hospitalist team will follow the patient with you.   Time Spent: 50 minutes  Jonah Blue M.D. [facility] 04/02/2021, 12:36 PM

## 2021-04-02 NOTE — Care Management Important Message (Signed)
Important Message  Patient Details  Name: Courtney [name] MRN: [id number] Date of Birth: 11-15-1945   Medicare Important Message Given:  Yes     Jamilet Ambroise Stefan Church 04/02/2021, 3:50 PM

## 2021-04-02 NOTE — Progress Notes (Signed)
Pt refused CPAP at this time.

## 2021-04-02 NOTE — Progress Notes (Addendum)
Subjective: The patient is somnolent but arousable.  She is in no apparent distress.  Her husband at the bedside.  By report she has some stridor and anxiety last night.  She was given Ativan and morphine.  Objective: Vital signs in last 24 hours: Temp:  [98.3 F (36.8 C)-100.3 F (37.9 C)] 98.5 F (36.9 C) (06/13 0340) Pulse Rate:  [80-91] 80 (06/13 0340) Resp:  [16-23] 23 (06/13 0340) BP: (116-141)/(60-75) 116/63 (06/13 0340) SpO2:  [92 %-100 %] 100 % (06/13 0340) Estimated body mass index is 40.48 kg/m as calculated from the following:   Height as of this encounter: 5\' 2"  (1.575 m).   Weight as of this encounter: 100.4 kg.   Intake/Output from previous day: 06/12 0701 - 06/13 0700 In: 360 [P.O.:360] Out: 950 [Urine:950] Intake/Output this shift: No intake/output data recorded.  Physical exam the patient is somnolent but arousable.  She is moving all 4 extremities.  There is absolutely no stridor.  Her neck incision is soft and flat without hematoma or shift.  Lab Results: Recent Labs    04/02/21 0014  WBC 16.6*  HGB 12.6  HCT 38.4  PLT 176   BMET Recent Labs    04/02/21 0014  NA 126*  K 4.5  CL 86*  CO2 30  GLUCOSE 103*  BUN 11  CREATININE 0.78  CALCIUM 9.1    Studies/Results: DG CHEST PORT 1 VIEW  Result Date: 04/01/2021 CLINICAL DATA:  Shortness of breath and wheezing. EXAM: PORTABLE CHEST 1 VIEW COMPARISON:  03/29/2021 FINDINGS: Again demonstrated is a poor inspiration. Interval mild enlargement of the cardiac silhouette. Mild-to-moderate peribronchial thickening with mild progression. Mild increase in prominence of the interstitial markings with no Kerley lines seen. No visible pleural fluid. Increased left basilar airspace opacity. Cervical spine fixation hardware. IMPRESSION: 1. Increased left basilar pneumonia or patchy atelectasis. 2. Progressive bronchitic changes. 3. Interval mild cardiomegaly. Electronically Signed   By: 05/29/2021 M.D.   On:  04/01/2021 13:46    Assessment/Plan: Postop day #5: There is no stridor today.  Dysphagia: She is scheduled for a modified barium swallow this afternoon.  Somnolence: I suspect this is from medications, hyponatremia, and lack of sleep.  I will discontinue her Ativan and morphine.  Atelectasis versus pneumonia: The patient has had some low-grade fevers.  Her white count is elevated.    Hyponatremia: I will asked the hospitalist to see the patient to help 06/01/2021 with this and management of her possible pneumonia.  I will change her IV fluid to normal saline.  LOS: 5 days     [place] 04/02/2021, 10:22 AM

## 2021-04-02 NOTE — Progress Notes (Signed)
Physical Therapy Treatment Patient Details Name: Courtney [name] MRN: [id number] DOB: 11-14-1945 Today's Date: 04/02/2021    History of Present Illness Pt is a 75 y/o female who presents s/p C3-C6 ACDF on 03/28/2021. on 6/9 pt with new onset stridor and difficulty breathing, required 2L O2 and transferred to ICU. PMH significant for bipolar disorder, HTN, mitral valve prolapse, claustrophobia, CVA, vertigo, PE, prior back surgery, B knee replacements.    PT Comments    The pt presents with increased lethargy and confusion this session compared to last PT visit (on 6/10). The pt was able to maintain alertness for short bouts, benefits from max cues to attend to task and required increased assist to maintain both seated and standing balance. The pt was only able to complete x5 sit-stand transfers from EOB and a few lateral steps at a time prior to needing to return to seated position due to fatigue and weakness in BLE. The pt's vitals remained stable through session. The pt will continue to benefit from max skilled PT acutely to regain mobility, and facilitate return to improved activity tolerance and independence with mobility.    Follow Up Recommendations  SNF;Supervision for mobility/OOB     Equipment Recommendations  Rolling walker with 5" wheels    Recommendations for Other Services       Precautions / Restrictions Precautions Precautions: Fall;Cervical;Other (comment) Precaution Booklet Issued: Yes (comment) Precaution Comments: pt able to verbally describe precautions, unable to apply to mobility Required Braces or Orthoses: Cervical Brace Cervical Brace: Hard collar Restrictions Weight Bearing Restrictions: No Other Position/Activity Restrictions: L wrist splint for any wt bearing through L wrist, pt with prior fx    Mobility  Bed Mobility Overal bed mobility: Needs Assistance Bed Mobility: Rolling;Sidelying to Sit;Sit to Sidelying Rolling: Mod assist Sidelying to sit: Mod  assist;HOB elevated     Sit to sidelying: Mod assist General bed mobility comments: modA to complete log roll and transition to sitting at this time, max cues for sequencing, modA to maitnain static sitting    Transfers Overall transfer level: Needs assistance Equipment used: Rolling walker (2 wheeled) Transfers: Sit to/from Stand Sit to Stand: Mod assist;From elevated surface         General transfer comment: modA to power up, max cues for hand placement, mod-max A to maintain standing. x3 instances of knee buckling but pt able to correct. pt asking to return to sit after 10 seconds. x6 through session  Ambulation/Gait Ambulation/Gait assistance: Max assist Gait Distance (Feet): 2 Feet Assistive device: Rolling walker (2 wheeled) Gait Pattern/deviations: Step-to pattern;Decreased stride length;Shuffle Gait velocity: Decreased Gait velocity interpretation: <1.31 ft/sec, indicative of household ambulator General Gait Details: pt with small lateral steps along EOB, maxA to maintain upright with activity      Balance Overall balance assessment: Needs assistance Sitting-balance support: Bilateral upper extremity supported;Feet supported Sitting balance-Leahy Scale: Poor Sitting balance - Comments: pt falling to R without modA Postural control: Right lateral lean Standing balance support: Bilateral upper extremity supported;During functional activity Standing balance-Leahy Scale: Poor Standing balance comment: Reliant on UE support throughout functional mobility.                            Cognition Arousal/Alertness: Lethargic Behavior During Therapy: WFL for tasks assessed/performed;Flat affect Overall Cognitive Status: Impaired/Different from baseline Area of Impairment: Orientation;Attention;Memory;Following commands;Safety/judgement;Awareness;Problem solving                 Orientation Level:  Disoriented to;Situation;Time;Place (pt stating she was in  [place], reoriented by husband, stating she had surgery on sunday (actually had it on wednesday) and then was convinced she had 2 surgeries) Current Attention Level: Focused Memory: Decreased recall of precautions;Decreased short-term memory Following Commands: Follows one step commands with increased time Safety/Judgement: Decreased awareness of safety;Decreased awareness of deficits Awareness: Intellectual Problem Solving: Slow processing;Decreased initiation;Difficulty sequencing;Requires verbal cues General Comments: pt needing increased time, max cues, and assist with sequencing. cues and physical assist to problem solve      Exercises      General Comments General comments (skin integrity, edema, etc.): VSS on 3L O2, pt very somulent and confused Sunday (6/12) and still with lethargy and disorientation today      Pertinent Vitals/Pain Pain Assessment: Faces Faces Pain Scale: Hurts a little bit Pain Location: Incision site/neck Pain Descriptors / Indicators: Operative site guarding Pain Intervention(s): Limited activity within patient's tolerance;Monitored during session;Repositioned     PT Goals (current goals can now be found in the care plan section) Acute Rehab PT Goals Patient Stated Goal: To SNF to be able to return to ALF with husband PT Goal Formulation: With patient Time For Goal Achievement: 04/05/21 Potential to Achieve Goals: Fair Progress towards PT goals: Not progressing toward goals - comment (lethargic and confused, fatigues more easily)    Frequency    Min 5X/week      PT Plan Current plan remains appropriate       AM-PAC PT "6 Clicks" Mobility   Outcome Measure  Help needed turning from your back to your side while in a flat bed without using bedrails?: A Little Help needed moving from lying on your back to sitting on the side of a flat bed without using bedrails?: A Little Help needed moving to and from a bed to a chair (including a  wheelchair)?: A Little Help needed standing up from a chair using your arms (e.g., wheelchair or bedside chair)?: A Little Help needed to walk in hospital room?: A Little Help needed climbing 3-5 steps with a railing? : A Little 6 Click Score: 18    End of Session Equipment Utilized During Treatment: Gait belt;Oxygen Activity Tolerance: Patient tolerated treatment well Patient left: in bed;with call bell/phone within reach;with bed alarm set;with nursing/sitter in room;with family/visitor present Nurse Communication: Mobility status PT Visit Diagnosis: Unsteadiness on feet (R26.81);Pain     Time: 2993-7169 PT Time Calculation (min) (ACUTE ONLY): 25 min  Charges:  $Gait Training: 8-22 mins $Therapeutic Activity: 8-22 mins                     Rolm Baptise, PT, DPT   Acute Rehabilitation Department Pager #: [phone]   Gaetana Michaelis 04/02/2021, 5:28 PM

## 2021-04-02 NOTE — TOC Progression Note (Signed)
Transition of Care [facility]) - Progression Note    Patient Details  Name: Courtney [name] MRN: [id number] Date of Birth: 10/29/1945  Transition of Care [facility]) CM/SW Contact  Eduard Roux, [place] Phone Number: 04/02/2021, 10:26 AM  Clinical Narrative:     CSW spoke with [facility]- updated pateint not medically stable for d/c.  Antony Blackbird, MSW, LCSW Clinical Social Worker    Expected Discharge Plan: Skilled Nursing Facility Barriers to Discharge: Continued Medical Work up  Expected Discharge Plan and Services Expected Discharge Plan: Skilled Nursing Facility In-house Referral: NA Discharge Planning Services: NA Post Acute Care Choice: Skilled Nursing Facility Living arrangements for the past 2 months: Apartment (Independent Living at [facility])                 DME Arranged: N/A DME Agency: NA       HH Arranged: NA HH Agency: NA         Social Determinants of Health (SDOH) Interventions    Readmission Risk Interventions No flowsheet data found.

## 2021-04-02 NOTE — Progress Notes (Signed)
Modified Barium Swallow Progress Note  Patient Details  Name: Courtney [name] MRN: [id number] Date of Birth: [birth year]  Today's Date: 04/02/2021  Modified Barium Swallow completed.  Full report located under Chart Review in the Imaging Section.  Brief recommendations include the following:  Clinical Impression  Pt's oral, pharyngeal and upper esophageal swallow was adequate demonstrating good ROM and timing. She did not appear to have significant pharyngeal edema with full and coordinated epiglottic inversion to prevent aspiration or appreciable penetration during assessment. She did have a throat clear on one occasion not appearing related to po's.If larger and sequential sips or mixed consistencies consumed she may have greater difficulty protecting airway. Recommend regular texture, thin, small cup/straw sips and pills whole in puree. Will briefly follow.   Swallow Evaluation Recommendations       SLP Diet Recommendations: Regular solids;Thin liquid   Liquid Administration via: Cup;Straw   Medication Administration: Whole meds with puree   Supervision: Full assist for feeding;Staff to assist with self feeding   Compensations: Slow rate;Small sips/bites   Postural Changes: Seated upright at 90 degrees   Oral Care Recommendations: Oral care BID        Royce Macadamia 04/02/2021,3:06 PM  Breck Coons Lonell Face.Ed Nurse, children's [phone] Office [phone]

## 2021-04-02 NOTE — Progress Notes (Signed)
  Speech Language Pathology T Patient Details Name: Courtney [name] MRN: [id number] DOB: 1946-04-09 Today's Date: 04/02/2021 Time:  -     MBS scheduled today at 1:00      Royce Macadamia 04/02/2021, 9:20 AM

## 2021-04-03 DIAGNOSIS — J690 Pneumonitis due to inhalation of food and vomit: Secondary | ICD-10-CM

## 2021-04-03 DIAGNOSIS — E785 Hyperlipidemia, unspecified: Secondary | ICD-10-CM

## 2021-04-03 DIAGNOSIS — G4733 Obstructive sleep apnea (adult) (pediatric): Secondary | ICD-10-CM

## 2021-04-03 DIAGNOSIS — E871 Hypo-osmolality and hyponatremia: Secondary | ICD-10-CM

## 2021-04-03 DIAGNOSIS — E039 Hypothyroidism, unspecified: Secondary | ICD-10-CM

## 2021-04-03 DIAGNOSIS — M4712 Other spondylosis with myelopathy, cervical region: Secondary | ICD-10-CM | POA: Diagnosis not present

## 2021-04-03 LAB — CBC WITH DIFFERENTIAL/PLATELET
Abs Immature Granulocytes: 0.1 10*3/uL — ABNORMAL HIGH (ref 0.00–0.07)
Basophils Absolute: 0 10*3/uL (ref 0.0–0.1)
Basophils Relative: 0 %
Eosinophils Absolute: 0 10*3/uL (ref 0.0–0.5)
Eosinophils Relative: 0 %
HCT: 32.7 % — ABNORMAL LOW (ref 36.0–46.0)
Hemoglobin: 10.6 g/dL — ABNORMAL LOW (ref 12.0–15.0)
Immature Granulocytes: 1 %
Lymphocytes Relative: 3 %
Lymphs Abs: 0.6 10*3/uL — ABNORMAL LOW (ref 0.7–4.0)
MCH: 29.1 pg (ref 26.0–34.0)
MCHC: 32.4 g/dL (ref 30.0–36.0)
MCV: 89.8 fL (ref 80.0–100.0)
Monocytes Absolute: 1.8 10*3/uL — ABNORMAL HIGH (ref 0.1–1.0)
Monocytes Relative: 8 %
Neutro Abs: 19.3 10*3/uL — ABNORMAL HIGH (ref 1.7–7.7)
Neutrophils Relative %: 88 %
Platelets: 187 10*3/uL (ref 150–400)
RBC: 3.64 MIL/uL — ABNORMAL LOW (ref 3.87–5.11)
RDW: 12.6 % (ref 11.5–15.5)
WBC: 21.8 10*3/uL — ABNORMAL HIGH (ref 4.0–10.5)
nRBC: 0 % (ref 0.0–0.2)

## 2021-04-03 LAB — BASIC METABOLIC PANEL
Anion gap: 6 (ref 5–15)
BUN: 11 mg/dL (ref 8–23)
CO2: 32 mmol/L (ref 22–32)
Calcium: 8.9 mg/dL (ref 8.9–10.3)
Chloride: 92 mmol/L — ABNORMAL LOW (ref 98–111)
Creatinine, Ser: 0.64 mg/dL (ref 0.44–1.00)
GFR, Estimated: >60 mL/min (ref >60)
Glucose, Bld: 117 mg/dL — ABNORMAL HIGH (ref 70–99)
Potassium: 4.3 mmol/L (ref 3.5–5.1)
Sodium: 130 mmol/L — ABNORMAL LOW (ref 135–145)

## 2021-04-03 NOTE — Progress Notes (Signed)
MEDICINE CONSULT PROGRESS NOTE  DELLA SCRIVENER  ZDG:[id number] DOB: 1946-10-02 DOA: 03/28/2021 PCP: Georgann Housekeeper, MD  Brief Narrative: MACEY WURTZ is a 75 y.o. female with a history of HTN, PE no longer on anticoagulation, OSA on nocturnal oxygen, and bipolar disorder who presented for surgical management of cervical spondylosis and stenosis, undergoing C3-C6 ACDF on 6/8. She's had hypoxic respiratory failure followed by PCCM in ICU, subsequently transferred to PCU on 6/10 with subsequent re in WBC, CXR demonstrating LLL opacity concerning for infection, suspicious for aspiration pneumonia in setting of throat irritation/dysphagia. Medicine was consulted 6/13 for hypoxia, hyponatremia, and AMS.   Subjective: Pt much more alert than previous note suggests she was. Has persistent cough with sore throat that is bothersome, but denies chest pain or dyspnea or leg swelling.   Objective: BP 134/74 (BP Location: Right Arm)   Pulse 80   Temp 98 F (36.7 C)   Resp 20   Ht 5\' 2"  (1.575 m)   Wt 100.4 kg   SpO2 99%   BMI 40.48 kg/m   Gen: Obese pleasant female in no distress Pulm: Slight crackles at left base, otherwise clear accounting for upper airway sounds/wheezing. No stridor.  CV: RRR, no murmur, no JVD, no edema GI: Soft, NT, ND, +BS  Neuro: Alert and oriented. No focal deficits. Ext: Warm, no deformities Skin: Anterior left neck surgical site without erythema, exudate or bleeding.   Assessment & Plan: Principal Problem:   Cervical spondylosis with myelopathy and radiculopathy Active Problems:   Aspiration pneumonia of left lower lobe due to regurgitated food (HCC)   Bipolar disorder (HCC)   Hypertension   Hyponatremia   Dyslipidemia   Hypothyroidism (acquired)   OSA (obstructive sleep apnea)  s/p C3-C5 ACDF 6/8:  - Pain control and postoperative management per primary, neurosurgery. - Agree with cepacol and/or chloraseptic spray to assist with decreasing tracheal edema seen  on CT.   Acute on chronic hypoxic respiratory failure: Due to PNA and atelectasis as below most likely on chronic asthma, OSA, suspected OHS with BMI 40.  - Abx as below - Need to strongly encourage incentive spirometry (ordered) - Continue nocturnal CPAP w/bleed-in oxygen  - Continue scheduled and prn bronchodilators and singulair. Pt has adventitious sounds on exam, though these are from the upper airway, not typical of bronchoconstriction.  - With +CXR findings, known airway irritation, and preexisting lung disease, I don't currently believe further work up is necessary, though with reported history of PE would recommend initiation of pharmacologic VTE ppx as soon as ok per neurosurgery. Would also initiate further work up if not improving.   Left base aspiration pneumonia: - Will continue unasyn. WBC up though fever curve improved. Could complete a 5 day course with augmentin when discharged.  - Aspiration precautions as advised by SLP. Will not need dysphagia diet per SLP.  Hyponatremia: Improved with low level isotonic saline. May have gotten a bit dry. Will continue at low rate for support for the next 12-24 hours.   Hypothyroidism: Continue synthroid. Recommend recheck TSH in 4-6 weeks.   Bipolar disorder: No manic symptoms - Continue home medications   HTN: Ok to restart home medications  HLD: Continue statin  8/8, MD [facility] [email or web address] 04/03/2021, 7:59 AM

## 2021-04-03 NOTE — Progress Notes (Addendum)
Pt called out while eating dinner due to coughing and a choking sensation. Pt educated to continue coughing and take deep breaths. Given small sips of water and assisted to the chair, as she is unable to sit up straight at edge of bed. Pt educated to eat sitting up in the chair in the future, to which she is agreeable. Note from SLP reviewed, and pt reminded to take small bites and small sips.  Robina Ade, RN

## 2021-04-03 NOTE — Progress Notes (Signed)
Occupational Therapy Treatment Patient Details Name: Courtney [name] MRN: [id number] DOB: 1946/02/28 Today's Date: 04/03/2021    History of present illness Pt is a 75 y/o female who presents s/p C3-C6 ACDF on 03/28/2021. on 6/9 pt with new onset stridor and difficulty breathing, required 2L O2 and transferred to ICU. PMH significant for bipolar disorder, HTN, mitral valve prolapse, claustrophobia, CVA, vertigo, PE, prior back surgery, B knee replacements.   OT comments  Pt presenting with decreased trunk motility assist with reaching tasks in sitting and cues to reduce lean to R side. Pt using stedy for sit to stand tasks x5 times with minA to minguardA +2 on both sides. Pt with decreased cognition and ability to care for self, thus SNF would be appropriate placement as pt requiring increased assist and pt with elderly spouse.  Pt desats to mid 80s on RA requiring 2L O2 >90%. Pt on 2L to conclude session. RN aware of changing O2 from 4L to 2L O2. Pt would benefit from continued OT skilled services. OT following acutely.    Follow Up Recommendations  SNF    Equipment Recommendations  None recommended by OT    Recommendations for Other Services      Precautions / Restrictions Precautions Precautions: Fall;Cervical;Other (comment) Precaution Booklet Issued: Yes (comment) Required Braces or Orthoses: Cervical Brace Cervical Brace: Hard collar;Other (comment) (apply while sitting) Restrictions Weight Bearing Restrictions: No Other Position/Activity Restrictions: L wrist splint for any wt bearing through L wrist, pt with prior fx       Mobility Bed Mobility Overal bed mobility: Needs Assistance Bed Mobility: Rolling;Sidelying to Sit Rolling: Min guard Sidelying to sit: Min assist     Sit to sidelying: Mod assist General bed mobility comments: Pt completing log roll towards right, minA for trunk to upright, modA for BLE management back into bed.    Transfers Overall transfer level:  Needs assistance Equipment used: Ambulation equipment used Transfers: Sit to/from Stand Sit to Stand: From elevated surface;Min assist         General transfer comment: MinA to power up to [employer] Overall balance assessment: Needs assistance Sitting-balance support: Bilateral upper extremity supported;Feet supported Sitting balance-Leahy Scale: Poor Sitting balance - Comments: pt falling to R and posterior bias with min-modA to correct Postural control: Right lateral lean Standing balance support: Bilateral upper extremity supported;During functional activity Standing balance-Leahy Scale: Poor Standing balance comment: Reliant on UE support throughout functional mobility.                           ADL either performed or assessed with clinical judgement   ADL Overall ADL's : Needs assistance/impaired     Grooming: Set up;Wash/dry hands;Sitting                   Toilet Transfer: Minimal assistance;+2 for physical assistance;+2 for safety/equipment;Ambulation Toilet Transfer Details (indicate cue type and reason): use of stedy for sit to stands         Functional mobility during ADLs: Minimal assistance;+2 for physical assistance;+2 for safety/equipment;Cueing for safety;Cueing for sequencing General ADL Comments: Pt presenting with decreased trunk motility assist with reaching tasks in sitting and cues to reduce lean to R side. Pt using stedy for sit to stand tasks x5 times with minA to minguardA +2 on both sides. Pt with decreased cognition and ability to care for self, thus SNF would be appropriate placement as pt requiring increased assist and  pt with elderly spouse.     Vision       Perception     Praxis      Cognition Arousal/Alertness: Awake/alert Behavior During Therapy: WFL for tasks assessed/performed;Flat affect Overall Cognitive Status: Impaired/Different from baseline Area of Impairment: Memory;Safety/judgement;Problem solving                      Memory: Decreased recall of precautions;Decreased short-term memory   Safety/Judgement: Decreased awareness of safety;Decreased awareness of deficits   Problem Solving: Requires verbal cues General Comments: Pt recalling precautions with visual cues. Pt stating she only needed to wear her cervical collar, "during surgery."        Exercises Exercises: Other exercises Other Exercises Other Exercises: Standing to Stedy x 5 Other Exercises: Sitting: scapular squeezes x 5 Other Exercises: Standing in Stedy: glute sets x 5 Other Exercises: Standing to Stedy x 5   Shoulder Instructions       General Comments Pt desats to mid 80s on RA requiring 2L O2 >90%. Pt on 2L to conclude session. RN aware of changing O2 from 4L to 2L O2.    Pertinent Vitals/ Pain       Pain Assessment: Faces Faces Pain Scale: Hurts a little bit Pain Location: Incision site/neck Pain Descriptors / Indicators: Operative site guarding Pain Intervention(s): Monitored during session;Repositioned  Home Living                                          Prior Functioning/Environment              Frequency  Min 2X/week        Progress Toward Goals  OT Goals(current goals can now be found in the care plan section)  Progress towards OT goals: Progressing toward goals  Acute Rehab OT Goals Patient Stated Goal: To SNF to be able to return to ALF with husband OT Goal Formulation: With patient Time For Goal Achievement: 04/12/21 Potential to Achieve Goals: Good ADL Goals Pt Will Perform Grooming: with supervision;standing Pt Will Perform Upper Body Dressing: with min assist;sitting Pt Will Perform Lower Body Dressing: with min assist;with adaptive equipment;sit to/from stand Pt Will Transfer to Toilet: with supervision;ambulating;regular height toilet Pt Will Perform Toileting - Clothing Manipulation and hygiene: with supervision;sitting/lateral  leans Additional ADL Goal #1: Pt will recall 3/3 precautions independently in preparation for ADLs  Plan Discharge plan remains appropriate    Co-evaluation    PT/OT/SLP Co-Evaluation/Treatment: Yes Reason for Co-Treatment: Complexity of the patient's impairments (multi-system involvement);To address functional/ADL transfers;For patient/therapist safety PT goals addressed during session: Mobility/safety with mobility;Balance OT goals addressed during session: ADL's and self-care;Strengthening/ROM      AM-PAC OT "6 Clicks" Daily Activity     Outcome Measure   Help from another person eating meals?: A Little Help from another person taking care of personal grooming?: A Little Help from another person toileting, which includes using toliet, bedpan, or urinal?: A Lot Help from another person bathing (including washing, rinsing, drying)?: A Lot Help from another person to put on and taking off regular upper body clothing?: A Lot Help from another person to put on and taking off regular lower body clothing?: A Lot 6 Click Score: 14    End of Session Equipment Utilized During Treatment: Oxygen  OT Visit Diagnosis: Unsteadiness on feet (R26.81);Other abnormalities of gait and mobility (R26.89);Muscle weakness (generalized) (  M62.81)   Activity Tolerance Patient tolerated treatment well   Patient Left in bed;with call bell/phone within reach;with bed alarm set   Nurse Communication Mobility status        Time: 1536-1600 OT Time Calculation (min): 24 min  Charges: OT General Charges $OT Visit: 1 Visit OT Treatments $Therapeutic Activity: 8-22 mins  Flora Lipps, OTR/L Acute Rehabilitation Services Pager: [phone] Office: [phone]    Lonzo Cloud 04/03/2021, 5:24 PM

## 2021-04-03 NOTE — Progress Notes (Signed)
Physical Therapy Treatment Patient Details Name: Courtney [name] MRN: [id number] DOB: [birth year] Today's Date: 04/03/2021    History of Present Illness Pt is a 75 y/o female who presents s/p C3-C6 ACDF on 03/28/2021. on 6/9 pt with new onset stridor and difficulty breathing, required 2L O2 and transferred to ICU. PMH significant for bipolar disorder, HTN, mitral valve prolapse, claustrophobia, CVA, vertigo, PE, prior back surgery, B knee replacements.    PT Comments    Pt pleasant and agreeable to participate. Continues with posterior lean with static sitting, so challenged core/trunk control with functional reaching, scapular retractions, and pulling up to stand with Stedy to promote anterior weight shift. Pt able to perform x 5 stands with the use of Stedy with focus on glute activation and combing hair to challenge balance. Continue to recommend SNF for ongoing Physical Therapy.       Follow Up Recommendations  SNF;Supervision for mobility/OOB     Equipment Recommendations  Rolling walker with 5" wheels    Recommendations for Other Services       Precautions / Restrictions Precautions Precautions: Fall;Cervical;Other (comment) Precaution Booklet Issued: Yes (comment) Required Braces or Orthoses: Cervical Brace Cervical Brace: Hard collar;Other (comment) (apply while sitting) Restrictions Weight Bearing Restrictions: No Other Position/Activity Restrictions: L wrist splint for any wt bearing through L wrist, pt with prior fx    Mobility  Bed Mobility Overal bed mobility: Needs Assistance Bed Mobility: Rolling;Sidelying to Sit Rolling: Min guard Sidelying to sit: Min assist     Sit to sidelying: Mod assist General bed mobility comments: Pt completing log roll towards right, minA for trunk to upright, modA for BLE management back into bed.    Transfers Overall transfer level: Needs assistance Equipment used: Ambulation equipment used Transfers: Sit to/from Stand Sit to  Stand: From elevated surface;Min assist         General transfer comment: MinA to power up to [employer], cues for glute activation  Ambulation/Gait                 Stairs             Wheelchair Mobility    Modified Rankin (Stroke Patients Only)       Balance Overall balance assessment: Needs assistance Sitting-balance support: Bilateral upper extremity supported;Feet supported Sitting balance-Leahy Scale: Poor Sitting balance - Comments: pt falling to R and posterior bias with min-modA to correct                                    Cognition Arousal/Alertness: Awake/alert Behavior During Therapy: WFL for tasks assessed/performed;Flat affect Overall Cognitive Status: Impaired/Different from baseline Area of Impairment: Memory;Safety/judgement;Problem solving                     Memory: Decreased recall of precautions;Decreased short-term memory   Safety/Judgement: Decreased awareness of safety;Decreased awareness of deficits   Problem Solving: Requires verbal cues General Comments: Improved cognition today although pt with decreased recall of precautions, stating she only needed to wear her cervical collar, "during surgery."      Exercises Other Exercises Other Exercises: Sitting edge of bed: functional reaching with BUE's in all planes Other Exercises: Sitting: scapular squeezes x 5 Other Exercises: Standing in Stedy: glute sets x 5 Other Exercises: Standing to Stedy x 5    General Comments        Pertinent Vitals/Pain Pain Assessment: Faces Faces Pain  Scale: Hurts a little bit Pain Location: Incision site/neck Pain Descriptors / Indicators: Operative site guarding Pain Intervention(s): Monitored during session    Home Living                      Prior Function            PT Goals (current goals can now be found in the care plan section) Acute Rehab PT Goals Patient Stated Goal: To SNF to be able to return to  ALF with husband PT Goal Formulation: With patient Time For Goal Achievement: 04/05/21 Potential to Achieve Goals: Fair Progress towards PT goals: Progressing toward goals    Frequency    Min 5X/week      PT Plan Current plan remains appropriate    Co-evaluation PT/OT/SLP Co-Evaluation/Treatment: Yes Reason for Co-Treatment: For patient/therapist safety;To address functional/ADL transfers PT goals addressed during session: Mobility/safety with mobility;Balance        AM-PAC PT "6 Clicks" Mobility   Outcome Measure  Help needed turning from your back to your side while in a flat bed without using bedrails?: A Little Help needed moving from lying on your back to sitting on the side of a flat bed without using bedrails?: A Lot Help needed moving to and from a bed to a chair (including a wheelchair)?: A Lot Help needed standing up from a chair using your arms (e.g., wheelchair or bedside chair)?: A Lot Help needed to walk in hospital room?: Total Help needed climbing 3-5 steps with a railing? : Total 6 Click Score: 11    End of Session Equipment Utilized During Treatment: Gait belt Activity Tolerance: Patient tolerated treatment well Patient left: with call bell/phone within reach;with bed alarm set;in bed Nurse Communication: Mobility status PT Visit Diagnosis: Unsteadiness on feet (R26.81);Pain     Time: 1530-1559 PT Time Calculation (min) (ACUTE ONLY): 29 min  Charges:  $Therapeutic Activity: 8-22 mins                     Lillia Pauls, PT, DPT Acute Rehabilitation Services Pager [phone] Office [phone]    Norval Morton 04/03/2021, 5:02 PM

## 2021-04-03 NOTE — Progress Notes (Signed)
  Speech Language Pathology Treatment: Dysphagia  Patient Details Name: Courtney [name] MRN: [id number] DOB: April 11, 1946 Today's Date: 04/03/2021 Time: 1350-1410 SLP Time Calculation (min) (ACUTE ONLY): 20 min  Assessment / Plan / Recommendation Clinical Impression  Pt seen at bedside for assessment of diet tolerance and education following MBS completed 04/02/21. Documented results and recommendations were reviewed with pt and spouse. Pt indicated she has a hard time remembering to take small bites and sips. Reminder was left in view of pt whether in bed or recliner. No overt s/s aspiration observed or reported. SLP will follow briefly for diet tolerance and education.    HPI HPI: Pt is a 75 y/o female who presents s/p C3-C6 ACDF on 03/28/2021. On 6/9 pt with new onset stridor and difficulty breathing, required 2L O2 and transferred to ICU. PMH significant for bipolar disorder, HTN, mitral valve prolapse, claustrophobia, CVA, vertigo, PE, prior back surgery, B knee replacements.      SLP Plan  Continue with current plan of care       Recommendations  Diet recommendations: Regular;Thin liquid Liquids provided via: Cup;Straw Medication Administration: Whole meds with puree Supervision: Patient able to self feed;Full supervision/cueing for compensatory strategies Compensations: Slow rate;Small sips/bites;Minimize environmental distractions Postural Changes and/or Swallow Maneuvers: Seated upright 90 degrees;Upright 30-60 min after meal                Oral Care Recommendations: Oral care BID Follow up Recommendations: None SLP Visit Diagnosis: Dysphagia, unspecified (R13.10) Plan: Continue with current plan of care       GO              Courtney [name] B. Murvin Natal, [facility], CCC-SLP Speech Language Pathologist Office: [phone] Pager: [phone]  Courtney [name] 04/03/2021, 2:13 PM

## 2021-04-03 NOTE — Progress Notes (Signed)
Patient declined CPAP use at this time.Patient attempted CPAP but would like to use oxygen at this time.  Patient aware to call for RT if CPAP desired.

## 2021-04-03 NOTE — Progress Notes (Signed)
Subjective: The patient is alert and pleasant.  She feels and looks much better.  She denies pain.  Objective: Vital signs in last 24 hours: Temp:  [97.5 F (36.4 C)-97.8 F (36.6 C)] 97.6 F (36.4 C) (06/14 0308) Pulse Rate:  [67-97] 79 (06/14 0308) Resp:  [12-18] 12 (06/14 0308) BP: (133-154)/(74-84) 133/83 (06/14 0308) SpO2:  [97 %-99 %] 98 % (06/14 0308) Estimated body mass index is 40.48 kg/m as calculated from the following:   Height as of this encounter: 5\' 2"  (1.575 m).   Weight as of this encounter: 100.4 kg.   Intake/Output from previous day: 06/13 0701 - 06/14 0700 In: 875 [I.V.:675; IV Piggyback:200] Out: 2600 [Urine:2600] Intake/Output this shift: No intake/output data recorded.  Physical exam the patient is alert and pleasant.  She looks well.  Her wound is healing well without hematoma or shift.  Her strength is normal.  Lab Results: Recent Labs    04/02/21 0014 04/03/21 0214  WBC 16.6* 21.8*  HGB 12.6 10.6*  HCT 38.4 32.7*  PLT 176 187   BMET Recent Labs    04/02/21 0014 04/03/21 0214  NA 126* 130*  K 4.5 4.3  CL 86* 92*  CO2 30 32  GLUCOSE 103* 117*  BUN 11 11  CREATININE 0.78 0.64  CALCIUM 9.1 8.9    Studies/Results: DG CHEST PORT 1 VIEW  Result Date: 04/01/2021 CLINICAL DATA:  Shortness of breath and wheezing. EXAM: PORTABLE CHEST 1 VIEW COMPARISON:  03/29/2021 FINDINGS: Again demonstrated is a poor inspiration. Interval mild enlargement of the cardiac silhouette. Mild-to-moderate peribronchial thickening with mild progression. Mild increase in prominence of the interstitial markings with no Kerley lines seen. No visible pleural fluid. Increased left basilar airspace opacity. Cervical spine fixation hardware. IMPRESSION: 1. Increased left basilar pneumonia or patchy atelectasis. 2. Progressive bronchitic changes. 3. Interval mild cardiomegaly. Electronically Signed   By: 05/29/2021 M.D.   On: 04/01/2021 13:46   DG Swallowing Func-Speech  Pathology  Result Date: 04/02/2021 Formatting of this result is different from the original. Objective Swallowing Evaluation: Type of Study: MBS-Modified Barium Swallow Study  Patient Details Name: JOSEFA SYRACUSE MRN: [name] Date of Birth: 07/24/1946 Today's Date: 04/02/2021 Time: SLP Start Time (ACUTE ONLY): 1256 -SLP Stop Time (ACUTE ONLY): 1311 SLP Time Calculation (min) (ACUTE ONLY): 15 min Past Medical History: Past Medical History: Diagnosis Date  Anemia   Arthritis   Bilateral Wrists  Asthma   Bipolar disorder (HCC)   Diverticulitis   Hiatal hernia   History of kidney stones 2006  Hypertension   Hypothyroidism (acquired) 04/02/2021  Kidney stone   Mitral valve prolapse   OSA (obstructive sleep apnea) 04/02/2021  Panic attack   PONV (postoperative nausea and vomiting)   Pulmonary embolus (HCC)   Sleep apnea   Stroke [facility])   Vertigo  Past Surgical History: Past Surgical History: Procedure Laterality Date  ABDOMINAL HYSTERECTOMY    ANTERIOR CERVICAL DECOMP/DISCECTOMY FUSION N/A 03/28/2021  Procedure: Cervical three-four Cervical four-five Cervical five-six Anterior cervical decompression/discectomy/fusion/interbody prosthesis/plate/screws;  Surgeon: 05/28/2021, MD;  Location: [facility] OR;  Service: Neurosurgery;  Laterality: N/A;  BACK SURGERY    BREAST REDUCTION SURGERY    CHOLECYSTECTOMY    EYE SURGERY Bilateral 2000  Cataract  JOINT REPLACEMENT Bilateral   Knee replacement  KNEE SURGERY    KNEE SURGERY    TOE SURGERY    TONSILLECTOMY   HPI: Pt is a 75 y/o female who presents s/p C3-C6 ACDF on 03/28/2021. On 6/9 pt  with new onset stridor and difficulty breathing, required 2L O2 and transferred to ICU. PMH significant for bipolar disorder, HTN, mitral valve prolapse, claustrophobia, CVA, vertigo, PE, prior back surgery, B knee replacements.  No data recorded Assessment / Plan / Recommendation CHL IP CLINICAL IMPRESSIONS 04/02/2021 Clinical Impression Pt's oral, pharyngeal and upper esophageal swallow was adequate  demonstrating good ROM and timing. She did not appear to have significant pharyngeal edema with full and coordinated epiglottic inversion to prevent aspiration or appreciable penetration during assessment. She did have a throat clear on one occasion not appearing related to po's.If larger and sequential sips or mixed consistencies consumed she may have greater difficulty protecting airway. Recommend regular texture, thin, small cup/straw sips and pills whole in puree. Will briefly follow. SLP Visit Diagnosis Dysphagia, unspecified (R13.10) Attention and concentration deficit following -- Frontal lobe and executive function deficit following -- Impact on safety and function Mild aspiration risk   CHL IP TREATMENT RECOMMENDATION 04/02/2021 Treatment Recommendations Therapy as outlined in treatment plan below   Prognosis 04/02/2021 Prognosis for Safe Diet Advancement Good Barriers to Reach Goals -- Barriers/Prognosis Comment -- CHL IP DIET RECOMMENDATION 04/02/2021 SLP Diet Recommendations Regular solids;Thin liquid Liquid Administration via Cup;Straw Medication Administration Whole meds with puree Compensations Slow rate;Small sips/bites Postural Changes Seated upright at 90 degrees   CHL IP OTHER RECOMMENDATIONS 04/02/2021 Recommended Consults -- Oral Care Recommendations Oral care BID Other Recommendations --   CHL IP FOLLOW UP RECOMMENDATIONS 04/02/2021 Follow up Recommendations None   CHL IP FREQUENCY AND DURATION 04/02/2021 Speech Therapy Frequency (ACUTE ONLY) min 2x/week Treatment Duration 2 weeks      CHL IP ORAL PHASE 04/02/2021 Oral Phase WFL Oral - Pudding Teaspoon -- Oral - Pudding Cup -- Oral - Honey Teaspoon -- Oral - Honey Cup -- Oral - Nectar Teaspoon -- Oral - Nectar Cup -- Oral - Nectar Straw -- Oral - Thin Teaspoon -- Oral - Thin Cup WFL Oral - Thin Straw WFL Oral - Puree WFL Oral - Mech Soft -- Oral - Regular WFL Oral - Multi-Consistency -- Oral - Pill -- Oral Phase - Comment --  CHL IP PHARYNGEAL PHASE  04/02/2021 Pharyngeal Phase WFL Pharyngeal- Pudding Teaspoon -- Pharyngeal -- Pharyngeal- Pudding Cup -- Pharyngeal -- Pharyngeal- Honey Teaspoon -- Pharyngeal -- Pharyngeal- Honey Cup -- Pharyngeal -- Pharyngeal- Nectar Teaspoon -- Pharyngeal -- Pharyngeal- Nectar Cup -- Pharyngeal -- Pharyngeal- Nectar Straw -- Pharyngeal -- Pharyngeal- Thin Teaspoon -- Pharyngeal -- Pharyngeal- Thin Cup [facility] Pharyngeal Material does not enter airway Pharyngeal- Thin Straw WFL Pharyngeal -- Pharyngeal- Puree WFL Pharyngeal -- Pharyngeal- Mechanical Soft -- Pharyngeal -- Pharyngeal- Regular WFL Pharyngeal -- Pharyngeal- Multi-consistency -- Pharyngeal -- Pharyngeal- Pill -- Pharyngeal -- Pharyngeal Comment --  CHL IP CERVICAL ESOPHAGEAL PHASE 04/02/2021 Cervical Esophageal Phase WFL Pudding Teaspoon -- Pudding Cup -- Honey Teaspoon -- Honey Cup -- Nectar Teaspoon -- Nectar Cup -- Nectar Straw -- Thin Teaspoon -- Thin Cup -- Thin Straw -- Puree -- Mechanical Soft -- Regular -- Multi-consistency -- Pill -- Cervical Esophageal Comment -- Royce Macadamia 04/02/2021, 3:05 PM  Breck Coons [employer] administrator Pager [phone] Office [phone]              Assessment/Plan: Postop day #6: The patient is doing much better.  Hyponatremia: This has improved her sodium is 130 today.  Pneumonia, leukocytosis: The patient's white count has increased.  She is on Unasyn.  I appreciate the hospitalist help with these medical problems.  Anemia:  Her hemoglobin has dropped but I do not see any signs of bleeding.  Dysphagia: She did okay with a barium swallow yesterday.  I appreciate speech therapy's help.  The patient is doing much better.  Perhaps she can go home tomorrow.  LOS: 6 days     Cristi Loron 04/03/2021, 7:41 AM

## 2021-04-04 DIAGNOSIS — E871 Hypo-osmolality and hyponatremia: Secondary | ICD-10-CM | POA: Diagnosis not present

## 2021-04-04 DIAGNOSIS — E785 Hyperlipidemia, unspecified: Secondary | ICD-10-CM | POA: Diagnosis not present

## 2021-04-04 DIAGNOSIS — J690 Pneumonitis due to inhalation of food and vomit: Secondary | ICD-10-CM | POA: Diagnosis not present

## 2021-04-04 DIAGNOSIS — M4712 Other spondylosis with myelopathy, cervical region: Secondary | ICD-10-CM | POA: Diagnosis not present

## 2021-04-04 LAB — CBC
HCT: 32.6 % — ABNORMAL LOW (ref 36.0–46.0)
Hemoglobin: 10.4 g/dL — ABNORMAL LOW (ref 12.0–15.0)
MCH: 29.4 pg (ref 26.0–34.0)
MCHC: 31.9 g/dL (ref 30.0–36.0)
MCV: 92.1 fL (ref 80.0–100.0)
Platelets: 257 10*3/uL (ref 150–400)
RBC: 3.54 MIL/uL — ABNORMAL LOW (ref 3.87–5.11)
RDW: 12.9 % (ref 11.5–15.5)
WBC: 14.4 10*3/uL — ABNORMAL HIGH (ref 4.0–10.5)
nRBC: 0 % (ref 0.0–0.2)

## 2021-04-04 LAB — SARS CORONAVIRUS 2 (TAT 6-24 HRS): SARS Coronavirus 2: NEGATIVE

## 2021-04-04 MED ORDER — CYCLOBENZAPRINE HCL 10 MG PO TABS: 10.0000 mg | ORAL_TABLET | Freq: Three times a day (TID) | ORAL | 0 refills | Status: DC | PRN

## 2021-04-04 MED ORDER — AMOXICILLIN-POT CLAVULANATE 875-125 MG PO TABS: 1.0000 | ORAL_TABLET | Freq: Two times a day (BID) | ORAL | 0 refills | Status: AC

## 2021-04-04 MED ORDER — HYDROCODONE-ACETAMINOPHEN 5-325 MG PO TABS: 1.0000 | ORAL_TABLET | ORAL | 0 refills | Status: DC | PRN

## 2021-04-04 NOTE — Discharge Summary (Signed)
Physician Discharge Summary     Providing Compassionate, Quality Care - Together   Patient ID: Courtney [name] MRN: [id number] DOB/AGE: [birth year] 75 y.o.  Admit date: 03/28/2021 Discharge date: 04/04/2021  Admission Diagnoses: Cervical spondylosis with myelopathy and radiculopathy  Discharge Diagnoses:  Principal Problem:   Cervical spondylosis with myelopathy and radiculopathy Active Problems:   Aspiration pneumonia of left lower lobe due to regurgitated food (HCC)   Bipolar disorder (HCC)   Hypertension   Hyponatremia   Dyslipidemia   Hypothyroidism (acquired)   OSA (obstructive sleep apnea)   Discharged Condition: good  Hospital Course: Patient underwent a C3-4, C4-5, C5-6 ACDF by Dr. Lovell Sheehan on 03/28/2021. She was admitted to [facility]  following recovery from anesthesia in the PACU. Her postoperative course was complicated by respiratory distress and hyponatremia. She has a history of respiratory insufficiency and is believed to have developed aspiration pneumonia during this admission. She was briefly transferred to the ICU for closer monitoring of her respiratory status. She was started on Unasyn and will transition to a five day course of Augmentin at discharge. She has been evaluated by speech therapy and undergone a modified barium swallow evaluation. She showed no signs of aspiration with regular solids and thin liquids. She has worked with both physical and occupational therapies who feel the patient is ready for discharge to a skilled nursing facility. She is ambulating with the aid of a walker and a standby assist. She is tolerating a normal diet. She is not having any bowel or bladder dysfunction. Her pain is well-controlled with oral pain medication. She is ready for discharge to a SNF.   Consults: pulmonary/intensive care, rehabilitation medicine, and hospitalists  Significant Diagnostic Studies: labs:  Results for orders placed or performed during the hospital encounter of  03/28/21 (from the past 24 hour(s))  CBC     Status: Abnormal   Collection Time: 04/04/21  1:58 AM  Result Value Ref Range   WBC 14.4 (H) 4.0 - 10.5 K/uL   RBC 3.54 (L) 3.87 - 5.11 MIL/uL   Hemoglobin 10.4 (L) 12.0 - 15.0 g/dL   HCT 96.7 (L) 59.1 - 63.8 %   MCV 92.1 80.0 - 100.0 fL   MCH 29.4 26.0 - 34.0 pg   MCHC 31.9 30.0 - 36.0 g/dL   RDW 46.6 59.9 - 35.7 %   Platelets 257 150 - 400 K/uL   nRBC 0.0 0.0 - 0.2 %    and radiology: DG CHEST PORT 1 VIEW  Result Date: 04/01/2021 CLINICAL DATA:  Shortness of breath and wheezing. EXAM: PORTABLE CHEST 1 VIEW COMPARISON:  03/29/2021 FINDINGS: Again demonstrated is a poor inspiration. Interval mild enlargement of the cardiac silhouette. Mild-to-moderate peribronchial thickening with mild progression. Mild increase in prominence of the interstitial markings with no Kerley lines seen. No visible pleural fluid. Increased left basilar airspace opacity. Cervical spine fixation hardware. IMPRESSION: 1. Increased left basilar pneumonia or patchy atelectasis. 2. Progressive bronchitic changes. 3. Interval mild cardiomegaly. Electronically Signed   By: Beckie Salts M.D.   On: 04/01/2021 13:46   DG Swallowing Func-Speech Pathology  Result Date: 04/02/2021 Formatting of this result is different from the original. Objective Swallowing Evaluation: Type of Study: MBS-Modified Barium Swallow Study  Patient Details Name: Courtney [name] MRN: [id number] Date of Birth: September 03, 1946 Today's Date: 04/02/2021 Time: SLP Start Time (ACUTE ONLY): 1256 -SLP Stop Time (ACUTE ONLY): 1311 SLP Time Calculation (min) (ACUTE ONLY): 15 min Past Medical History: Past Medical History: Diagnosis Date  Anemia  Arthritis   Bilateral Wrists  Asthma   Bipolar disorder (HCC)   Diverticulitis   Hiatal hernia   History of kidney stones 2006  Hypertension   Hypothyroidism (acquired) 04/02/2021  Kidney stone   Mitral valve prolapse   OSA (obstructive sleep apnea) 04/02/2021  Panic attack   PONV  (postoperative nausea and vomiting)   Pulmonary embolus (HCC)   Sleep apnea   Stroke [facility])   Vertigo  Past Surgical History: Past Surgical History: Procedure Laterality Date  ABDOMINAL HYSTERECTOMY    ANTERIOR CERVICAL DECOMP/DISCECTOMY FUSION N/A 03/28/2021  Procedure: Cervical three-four Cervical four-five Cervical five-six Anterior cervical decompression/discectomy/fusion/interbody prosthesis/plate/screws;  Surgeon: Tressie Stalker, MD;  Location: [facility] OR;  Service: Neurosurgery;  Laterality: N/A;  BACK SURGERY    BREAST REDUCTION SURGERY    CHOLECYSTECTOMY    EYE SURGERY Bilateral 2000  Cataract  JOINT REPLACEMENT Bilateral   Knee replacement  KNEE SURGERY    KNEE SURGERY    TOE SURGERY    TONSILLECTOMY   HPI: Pt is a 75 y/o female who presents s/p C3-C6 ACDF on 03/28/2021. On 6/9 pt with new onset stridor and difficulty breathing, required 2L O2 and transferred to ICU. PMH significant for bipolar disorder, HTN, mitral valve prolapse, claustrophobia, CVA, vertigo, PE, prior back surgery, B knee replacements.  No data recorded Assessment / Plan / Recommendation CHL IP CLINICAL IMPRESSIONS 04/02/2021 Clinical Impression Pt's oral, pharyngeal and upper esophageal swallow was adequate demonstrating good ROM and timing. She did not appear to have significant pharyngeal edema with full and coordinated epiglottic inversion to prevent aspiration or appreciable penetration during assessment. She did have a throat clear on one occasion not appearing related to po's.If larger and sequential sips or mixed consistencies consumed she may have greater difficulty protecting airway. Recommend regular texture, thin, small cup/straw sips and pills whole in puree. Will briefly follow. SLP Visit Diagnosis Dysphagia, unspecified (R13.10) Attention and concentration deficit following -- Frontal lobe and executive function deficit following -- Impact on safety and function Mild aspiration risk   CHL IP TREATMENT RECOMMENDATION 04/02/2021  Treatment Recommendations Therapy as outlined in treatment plan below   Prognosis 04/02/2021 Prognosis for Safe Diet Advancement Good Barriers to Reach Goals -- Barriers/Prognosis Comment -- CHL IP DIET RECOMMENDATION 04/02/2021 SLP Diet Recommendations Regular solids;Thin liquid Liquid Administration via Cup;Straw Medication Administration Whole meds with puree Compensations Slow rate;Small sips/bites Postural Changes Seated upright at 90 degrees   CHL IP OTHER RECOMMENDATIONS 04/02/2021 Recommended Consults -- Oral Care Recommendations Oral care BID Other Recommendations --   CHL IP FOLLOW UP RECOMMENDATIONS 04/02/2021 Follow up Recommendations None   CHL IP FREQUENCY AND DURATION 04/02/2021 Speech Therapy Frequency (ACUTE ONLY) min 2x/week Treatment Duration 2 weeks      CHL IP ORAL PHASE 04/02/2021 Oral Phase WFL Oral - Pudding Teaspoon -- Oral - Pudding Cup -- Oral - Honey Teaspoon -- Oral - Honey Cup -- Oral - Nectar Teaspoon -- Oral - Nectar Cup -- Oral - Nectar Straw -- Oral - Thin Teaspoon -- Oral - Thin Cup WFL Oral - Thin Straw WFL Oral - Puree WFL Oral - Mech Soft -- Oral - Regular WFL Oral - Multi-Consistency -- Oral - Pill -- Oral Phase - Comment --  CHL IP PHARYNGEAL PHASE 04/02/2021 Pharyngeal Phase WFL Pharyngeal- Pudding Teaspoon -- Pharyngeal -- Pharyngeal- Pudding Cup -- Pharyngeal -- Pharyngeal- Honey Teaspoon -- Pharyngeal -- Pharyngeal- Honey Cup -- Pharyngeal -- Pharyngeal- Nectar Teaspoon -- Pharyngeal -- Pharyngeal- Nectar Cup -- Pharyngeal -- Pharyngeal-  Nectar Straw -- Pharyngeal -- Pharyngeal- Thin Teaspoon -- Pharyngeal -- Pharyngeal- Thin Cup [facility] Pharyngeal Material does not enter airway Pharyngeal- Thin Straw WFL Pharyngeal -- Pharyngeal- Puree WFL Pharyngeal -- Pharyngeal- Mechanical Soft -- Pharyngeal -- Pharyngeal- Regular WFL Pharyngeal -- Pharyngeal- Multi-consistency -- Pharyngeal -- Pharyngeal- Pill -- Pharyngeal -- Pharyngeal Comment --  CHL IP CERVICAL ESOPHAGEAL PHASE 04/02/2021  Cervical Esophageal Phase WFL Pudding Teaspoon -- Pudding Cup -- Honey Teaspoon -- Honey Cup -- Nectar Teaspoon -- Nectar Cup -- Nectar Straw -- Thin Teaspoon -- Thin Cup -- Thin Straw -- Puree -- Mechanical Soft -- Regular -- Multi-consistency -- Pill -- Cervical Esophageal Comment -- Royce Macadamia 04/02/2021, 3:05 PM  Breck Coons Litaker M.Ed Nurse, children's [phone] Office [phone]               Treatments: surgery:  C3-4, C4-5 and C5-6 anterior cervical discectomy/decompression; C3-4, C4-5 and C5-6 interbody arthrodesis with local morcellized autograft bone and Zimmer DBM; insertion of interbody prosthesis at C3-4, C4-5 and C5-6 (Zimmer peek interbody prosthesis); anterior cervical plating from C3-C6 with globus titanium plate  Discharge Exam: Blood pressure (!) 151/79, pulse 79, temperature 97.9 F (36.6 C), temperature source Oral, resp. rate 17, height 5\' 2"  (1.575 m), weight 100.4 kg, SpO2 100 %.  Alert and oriented x 4 PERRLA CN II-XII grossly intact Speech clear MAE Trachea is midline and without edema; small amount of bruising present Incision is covered with Steri Strips; Dressing is clean, dry, and intact    Disposition: Discharge disposition: 03-Skilled Nursing Facility        Allergies as of 04/04/2021       Reactions   Tetanus-diphtheria Toxoids Td Anaphylaxis   Meloxicam Nausea And Vomiting   Phentermine Nausea And Vomiting   Sulfa Antibiotics Diarrhea, Nausea And Vomiting   Zithromax [azithromycin] Diarrhea, Nausea And Vomiting        Medication List     STOP taking these medications    LORazepam 1 MG tablet Commonly known as: ATIVAN   promethazine 25 MG tablet Commonly known as: PHENERGAN       TAKE these medications    albuterol 108 (90 Base) MCG/ACT inhaler Commonly known as: VENTOLIN HFA Inhale 1 puff into the lungs every 6 (six) hours as needed.   amoxicillin-clavulanate 875-125 MG tablet Commonly known  as: Augmentin Take 1 tablet by mouth 2 (two) times daily for 5 days.   aspirin 325 MG tablet Take 325 mg by mouth daily.   atorvastatin 80 MG tablet Commonly known as: LIPITOR Take 80 mg by mouth daily.   Biotin 5000 MCG Caps Take 5,000 mcg by mouth daily.   buPROPion 150 MG 24 hr tablet Commonly known as: WELLBUTRIN XL Take 150 mg by mouth daily.   cyanocobalamin 1000 MCG/ML injection Commonly known as: (VITAMIN B-12) Inject 1,000 mcg into the muscle every 30 (thirty) days.   cyclobenzaprine 10 MG tablet Commonly known as: FLEXERIL Take 1 tablet (10 mg total) by mouth 3 (three) times daily as needed for muscle spasms.   diclofenac Sodium 1 % Gel Commonly known as: VOLTAREN Apply 2 g topically daily.   docusate sodium 100 MG capsule Commonly known as: COLACE Take 200 mg by mouth daily.   escitalopram 10 MG tablet Commonly known as: LEXAPRO Take 10 mg by mouth daily.   Flovent HFA 44 MCG/ACT inhaler Generic drug: fluticasone Inhale 2 puffs into the lungs in the morning and at bedtime.   gabapentin 100 MG capsule  Commonly known as: NEURONTIN Take 100 mg by mouth in the morning, at noon, and at bedtime.   HYDROcodone-acetaminophen 5-325 MG tablet Commonly known as: NORCO/VICODIN Take 1-2 tablets by mouth every 4 (four) hours as needed for moderate pain. What changed:  how much to take reasons to take this   levothyroxine 50 MCG tablet Commonly known as: SYNTHROID Take 50 mcg by mouth daily before breakfast.   magnesium oxide 400 MG tablet Commonly known as: MAG-OX Take 400 mg by mouth daily.   meclizine 25 MG tablet Commonly known as: ANTIVERT Take 25 mg by mouth 3 (three) times daily as needed for dizziness.   melatonin 5 MG Tabs Take 5 mg by mouth at bedtime.   montelukast 10 MG tablet Commonly known as: SINGULAIR Take 10 mg by mouth daily.   multivitamin-lutein Caps capsule Take 1 capsule by mouth daily.   olmesartan 40 MG tablet Commonly  known as: BENICAR Take 40 mg by mouth daily.   Ozempic (0.25 or 0.5 MG/DOSE) 2 MG/1.5ML Sopn Generic drug: Semaglutide(0.25 or 0.5MG /DOS) Inject 0.25 mg into the skin every Tuesday.   pantoprazole 40 MG tablet Commonly known as: PROTONIX Take 40 mg by mouth daily.   psyllium 0.52 g capsule Commonly known as: REGULOID Take 1.04 g by mouth daily.   solifenacin 10 MG tablet Commonly known as: VESICARE Take 10 mg by mouth daily.   verapamil 120 MG CR tablet Commonly known as: CALAN-SR Take 360 mg by mouth daily.   Vitamin D3 25 MCG (1000 UT) Caps Take 1,000 Units by mouth daily.        Follow-up Information     Tressie StalkerJenkins, Jeffrey, MD. Schedule an appointment as soon as possible for a visit in 2 week(s).   Specialty: Neurosurgery Contact information: [address] [place] [address] [phone]                 Signed: Val EagleMeghan Erwin Nishiyama, DNP, AGNP-C Nurse Practitioner  [facility] [address], [address], [place], [place] [address] P: [phone]    F: [phone]  04/04/2021, 9:02 AM

## 2021-04-04 NOTE — TOC Progression Note (Signed)
Transition of Care [facility]) - Progression Note    Patient Details  Name: Courtney [name] MRN: [id number] Date of Birth: 07/09/1946  [facility] CM/SW Contact  Eduard Roux, [place] Phone Number: 04/04/2021, 12:09 PM  Clinical Narrative:     CSW informed SNF/[facility], patient is medically stable for discharge- SNF will start insurance authorization today- covid test requested  CSW will continue to follow and assist with discharge planning.  Antony Blackbird, MSW, LCSW Clinical Social Worker    Expected Discharge Plan: Skilled Nursing Facility Barriers to Discharge: Continued Medical Work up  Expected Discharge Plan and Services Expected Discharge Plan: Skilled Nursing Facility In-house Referral: NA Discharge Planning Services: NA Post Acute Care Choice: Skilled Nursing Facility Living arrangements for the past 2 months: Apartment ([facility]) Expected Discharge Date: 04/04/21               DME Arranged: N/A DME Agency: NA       HH Arranged: NA HH Agency: NA         Social Determinants of Health (SDOH) Interventions    Readmission Risk Interventions No flowsheet data found.

## 2021-04-04 NOTE — Progress Notes (Signed)
MEDICINE CONSULT PROGRESS NOTE  Courtney [name]  JJO:[id number] DOB: 1946/08/01 DOA: 03/28/2021 PCP: Georgann Housekeeper, MD  Brief Narrative: Courtney [name] is a 75 y.o. female with a history of HTN, PE no longer on anticoagulation, OSA on nocturnal oxygen, and bipolar disorder who presented for surgical management of cervical spondylosis and stenosis, undergoing C3-C6 ACDF on 6/8. She's had hypoxic respiratory failure followed by PCCM in ICU, subsequently transferred to PCU on 6/10 with subsequent re in WBC, CXR demonstrating LLL opacity concerning for infection, suspicious for aspiration pneumonia in setting of throat irritation/dysphagia. Medicine was consulted 6/13 for hypoxia, hyponatremia, and AMS.   Subjective: Feels better, sitting up in chair eating breakfast. Wants a shower. No dyspnea, still with some coughing with po intake but improved with aspiration precautions.   Objective: BP (!) 151/79 (BP Location: Right Arm)   Pulse 79   Temp 97.9 F (36.6 C) (Oral)   Resp 17   Ht 5\' 2"  (1.575 m)   Wt 100.4 kg   SpO2 100%   BMI 40.48 kg/m   Gen: 75 y.o. female in no distress Pulm: Nonlabored breathing 2L O2, no wheezing or crackles. No stridor. CV: Regular rate and rhythm. No murmur, rub, or gallop. No JVD, no pitting dependent edema. GI: Abdomen soft, non-tender, non-distended, with normoactive bowel sounds.  Ext: Warm, no deformities Skin: No new rashes, lesions or ulcers on visualized skin. Anterior left neck skin incision c/d/I.  Neuro: Alert and oriented. No focal neurological deficits. Psych: Judgement and insight appear fair. Mood euthymic & affect congruent. Behavior is appropriate.    Assessment & Plan: Principal Problem:   Cervical spondylosis with myelopathy and radiculopathy Active Problems:   Aspiration pneumonia of left lower lobe due to regurgitated food (HCC)   Bipolar disorder (HCC)   Hypertension   Hyponatremia   Dyslipidemia   Hypothyroidism (acquired)   OSA  (obstructive sleep apnea)  Left base aspiration pneumonia: - WBC promptly responding and hypoxia improved with unasyn. D/w primary, recommend she complete a 5 day course with augmentin when discharged.  - Aspiration precautions as advised by SLP. Will not need dysphagia diet per SLP, just frequent small bites.   s/p C3-C5 ACDF 6/8:  - Pain control and postoperative management per primary, neurosurgery. - Agree with cepacol and/or chloraseptic spray to assist with decreasing tracheal edema seen on CT.   Acute on chronic hypoxic respiratory failure: Due to PNA and atelectasis as below most likely on chronic asthma, OSA, suspected OHS with BMI 40.  - Abx as below - Need to strongly encourage incentive spirometry (ordered) - Continue nocturnal CPAP w/bleed-in oxygen  - Continue scheduled and prn bronchodilators and singulair. Pt has adventitious sounds on exam, though these are from the upper airway, not typical of bronchoconstriction.  - With +CXR findings, known airway irritation, and preexisting lung disease, I don't currently believe further work up is necessary, though with reported history of PE would recommend initiation of pharmacologic VTE ppx as soon as ok per neurosurgery. Would also initiate further work up if not improving.   Hyponatremia: Improved with low level isotonic saline. May have gotten a bit dry.   Hypothyroidism: Continue synthroid. Recommend recheck TSH in 4-6 weeks.   Bipolar disorder: No manic symptoms - Continue home medications   HTN: Ok to restart home medications  HLD: Continue statin  8/8, MD [facility] [email or web address] 04/04/2021, 9:14 AM

## 2021-04-04 NOTE — Progress Notes (Signed)
Patient declined use of CPAP at this time.

## 2021-04-04 NOTE — Progress Notes (Signed)
Physical Therapy Treatment Patient Details Name: Courtney [name] MRN: [id number] DOB: 1946-01-12 Today's Date: 04/04/2021    History of Present Illness Pt is a 75 y/o female who presents s/p C3-C6 ACDF on 03/28/2021. on 6/9 pt with new onset stridor and difficulty breathing, required 2L O2 and transferred to ICU. PMH significant for bipolar disorder, HTN, mitral valve prolapse, claustrophobia, CVA, vertigo, PE, prior back surgery, B knee replacements.    PT Comments    Pt progressing towards her physical therapy goals; remains motivated to participate. Session focused on sitting and standing balance, postural re-education, and core activation. Pt requiring mod assist for bed mobility; utilized Corene Cornea to progress from bed to chair. SpO2 93% on RA. Pt continues with posterior and right lateral lean in sitting, weakness, and decreased activity tolerance. Continue to recommend SNF for ongoing Physical Therapy.      Follow Up Recommendations  SNF;Supervision for mobility/OOB     Equipment Recommendations  Rolling walker with 5" wheels    Recommendations for Other Services       Precautions / Restrictions Precautions Precautions: Fall;Cervical;Other (comment) Precaution Booklet Issued: Yes (comment) Required Braces or Orthoses: Cervical Brace Cervical Brace: Hard collar;Other (comment) (apply while sitting) Restrictions Other Position/Activity Restrictions: L wrist splint for any wt bearing through L wrist, pt with prior fx    Mobility  Bed Mobility Overal bed mobility: Needs Assistance Bed Mobility: Rolling;Sidelying to Sit Rolling: Min guard Sidelying to sit: Mod assist       General bed mobility comments: Pt completing log roll towards right, modA for trunk to upright. Significantly increased time for reciprocal scooting anteriorly    Transfers Overall transfer level: Needs assistance Equipment used: Ambulation equipment used Transfers: Sit to/from Stand Sit to Stand:  From elevated surface;Min assist         General transfer comment: MinA to power up to [place], cues for glute and scapular elevation  Ambulation/Gait                 Stairs             Wheelchair Mobility    Modified Rankin (Stroke Patients Only)       Balance Overall balance assessment: Needs assistance Sitting-balance support: Bilateral upper extremity supported;Feet supported Sitting balance-Leahy Scale: Poor Sitting balance - Comments: pt falling to R and posterior bias with min-modA to correct                                    Cognition Arousal/Alertness: Awake/alert Behavior During Therapy: WFL for tasks assessed/performed;Flat affect Overall Cognitive Status: Impaired/Different from baseline Area of Impairment: Memory;Safety/judgement;Problem solving                     Memory: Decreased recall of precautions;Decreased short-term memory   Safety/Judgement: Decreased awareness of safety;Decreased awareness of deficits   Problem Solving: Requires verbal cues        Exercises Other Exercises Other Exercises: Standing to [place] x 3 Other Exercises: Standing (in [place]): glute sets x 5, scapular squeezes x 5, functional reaching with BUE's    General Comments        Pertinent Vitals/Pain Pain Assessment: Faces Faces Pain Scale: Hurts a little bit Pain Location: Incision site/neck Pain Descriptors / Indicators: Operative site guarding Pain Intervention(s): Monitored during session    Home Living  Prior Function            PT Goals (current goals can now be found in the care plan section) Acute Rehab PT Goals Patient Stated Goal: To SNF to be able to return to ALF with husband PT Goal Formulation: With patient Time For Goal Achievement: 04/05/21 Potential to Achieve Goals: Fair Progress towards PT goals: Progressing toward goals    Frequency    Min 5X/week      PT Plan  Current plan remains appropriate    Co-evaluation              AM-PAC PT "6 Clicks" Mobility   Outcome Measure  Help needed turning from your back to your side while in a flat bed without using bedrails?: A Little Help needed moving from lying on your back to sitting on the side of a flat bed without using bedrails?: A Lot Help needed moving to and from a bed to a chair (including a wheelchair)?: A Lot Help needed standing up from a chair using your arms (e.g., wheelchair or bedside chair)?: A Lot Help needed to walk in hospital room?: Total Help needed climbing 3-5 steps with a railing? : Total 6 Click Score: 11    End of Session Equipment Utilized During Treatment: Gait belt Activity Tolerance: Patient tolerated treatment well Patient left: with call bell/phone within reach;with bed alarm set;in bed Nurse Communication: Mobility status PT Visit Diagnosis: Unsteadiness on feet (R26.81);Pain     Time: 1520-1546 PT Time Calculation (min) (ACUTE ONLY): 26 min  Charges:  $Therapeutic Activity: 23-37 mins                     Lillia Pauls, PT, DPT Acute Rehabilitation Services Pager [phone] Office [phone]    Norval Morton 04/04/2021, 5:25 PM

## 2021-04-04 NOTE — Discharge Instructions (Signed)
Wound Care Keep incision covered and dry for two days.    Do not put any creams, lotions, or ointments on incision. Leave steri-strips on back.  They will fall off by themselves.  Activity Walk each and every day, increasing distance each day. No lifting greater than 5 lbs.  Avoid excessive neck motion. No driving for 2 weeks; may ride as a passenger locally.  Diet Resume your normal diet.   Return to Work Will be discussed at your follow up appointment.  Call Your Doctor If Any of These Occur Redness, drainage, or swelling at the wound.  Temperature greater than 101 degrees. Severe pain not relieved by pain medication. Incision starts to come apart.  Follow Up Appt Call today for appointment in 1-2 weeks ([phone]) or for problems.  If you have any hardware placed in your spine, you will need an x-ray before your appointment.  

## 2021-04-05 NOTE — TOC Progression Note (Signed)
Transition of Care [facility]) - Progression Note    Patient Details  Name: Courtney [name] MRN: [id number] Date of Birth: Mar 21, 1946  Transition of Care [facility]) CM/SW Contact  Eduard Roux, [place] Phone Number: 04/05/2021, 5:15 PM  Clinical Narrative:     SNF has received authorization for SNF- anticipate patient d/c tomorrow to [facility]- patient will need updated d/c summary.  Antony Blackbird, MSW, LCSW Clinical Social Worker    Expected Discharge Plan: Skilled Nursing Facility Barriers to Discharge: Continued Medical Work up  Expected Discharge Plan and Services Expected Discharge Plan: Skilled Nursing Facility In-house Referral: NA Discharge Planning Services: NA Post Acute Care Choice: Skilled Nursing Facility Living arrangements for the past 2 months: Apartment ([facility] [place]) Expected Discharge Date: 04/04/21               DME Arranged: N/A DME Agency: NA       HH Arranged: NA HH Agency: NA         Social Determinants of Health (SDOH) Interventions    Readmission Risk Interventions No flowsheet data found.

## 2021-04-05 NOTE — TOC Progression Note (Addendum)
Transition of Care [facility]) - Progression Note    Patient Details  Name: Courtney [name] MRN: [id number] Date of Birth: July 25, 1946  Transition of Care [facility]) CM/SW Contact  Eduard Roux, [place] Phone Number: 04/05/2021, 11:03 AM  Clinical Narrative:     Per SNF/[facility]- insurance authorization remains pending. SNF will contact CSW once Berkley Harvey is received.  Antony Blackbird, MSW, LCSW Clinical Social Worker    Expected Discharge Plan: Skilled Nursing Facility Barriers to Discharge: Continued Medical Work up  Expected Discharge Plan and Services Expected Discharge Plan: Skilled Nursing Facility In-house Referral: NA Discharge Planning Services: NA Post Acute Care Choice: Skilled Nursing Facility Living arrangements for the past 2 months: Apartment ([facility] at [place]) Expected Discharge Date: 04/04/21               DME Arranged: N/A DME Agency: NA       HH Arranged: NA HH Agency: NA         Social Determinants of Health (SDOH) Interventions    Readmission Risk Interventions No flowsheet data found.

## 2021-04-05 NOTE — Progress Notes (Signed)
  Speech Language Pathology Treatment: Dysphagia  Patient Details Name: Courtney [name] MRN: [id number] DOB: 03-14-1946 Today's Date: 04/05/2021 Time: 9509-3267 SLP Time Calculation (min) (ACUTE ONLY): 12 min  Assessment / Plan / Recommendation Clinical Impression  Dysphagia treatment with son and husband present. General coughing reported during the day not related to food due to not having her inhalers for asthma. She got "choked on chicken tender and had to call the nurse but able to cough it up." Feels that bite was partially masticated prior to swallow and have been ordering softer foods. Reviewed food from menu that would be less particulate or dry. She received roast on lunch tray. Pt reports trying to take smaller sips and denies coughing with liquids. One cough during po observation that suspect was not related to food/liquid. Reviewed strategies and reminded not to verbalize when masticating or after swallowing. Continue regular/thin. ST will sign off at this time.    HPI HPI: Pt is a 75 y/o female who presents s/p C3-C6 ACDF on 03/28/2021. On 6/9 pt with new onset stridor and difficulty breathing, required 2L O2 and transferred to ICU. PMH significant for bipolar disorder, HTN, mitral valve prolapse, claustrophobia, CVA, vertigo, PE, prior back surgery, B knee replacements.      SLP Plan  All goals met       Recommendations  Diet recommendations: Regular;Thin liquid Liquids provided via: Cup;Straw Medication Administration: Whole meds with puree Supervision: Patient able to self feed;Intermittent supervision to cue for compensatory strategies Compensations: Slow rate;Small sips/bites;Minimize environmental distractions Postural Changes and/or Swallow Maneuvers: Seated upright 90 degrees;Upright 30-60 min after meal                Oral Care Recommendations: Oral care BID Follow up Recommendations: None SLP Visit Diagnosis: Dysphagia, unspecified (R13.10) Plan: All goals  met       GO       Houston Siren 04/05/2021, 12:03 PM  Orbie Pyo Colvin Caroli.Ed Risk analyst [phone] Office [phone]

## 2021-04-05 NOTE — Progress Notes (Signed)
Subjective: The patient is alert and pleasant.  She looks well.  She is in no apparent distress.  Her husband and son are at the bedside.  Objective: Vital signs in last 24 hours: Temp:  [97.7 F (36.5 C)-98.6 F (37 C)] 98.1 F (36.7 C) (06/16 1100) Pulse Rate:  [69-82] 82 (06/16 1100) Resp:  [16-18] 18 (06/16 0745) BP: (123-167)/(70-81) 147/78 (06/16 1100) SpO2:  [90 %-98 %] 95 % (06/16 1100) Estimated body mass index is 40.48 kg/m as calculated from the following:   Height as of this encounter: 5\' 2"  (1.575 m).   Weight as of this encounter: 100.4 kg.   Intake/Output from previous day: 06/15 0701 - 06/16 0700 In: 860 [P.O.:860] Out: 3450 [Urine:3450] Intake/Output this shift: Total I/O In: -  Out: 500 [Urine:500]  Physical exam the patient is alert and pleasant.  Her strength is normal.  Her wound is healing well without hematoma or shift.  Lab Results: Recent Labs    04/03/21 0214 04/04/21 0158  WBC 21.8* 14.4*  HGB 10.6* 10.4*  HCT 32.7* 32.6*  PLT 187 257   BMET Recent Labs    04/03/21 0214  NA 130*  K 4.3  CL 92*  CO2 32  GLUCOSE 117*  BUN 11  CREATININE 0.64  CALCIUM 8.9    Studies/Results: No results found.  Assessment/Plan: Postop day #8: The patient is doing well.  We are awaiting skilled nursing facility placement.  LOS: 8 days     04/05/21 04/05/2021, 12:07 PM

## 2021-04-05 NOTE — Progress Notes (Signed)
Rt note. Patient refuse CPAP tonight

## 2021-04-05 NOTE — Final Consult Note (Signed)
MEDICINE CONSULT SIGNOFF NOTE  DENICE CARDON  HWT:[id number] DOB: 09/28/1946 DOA: 03/28/2021 PCP: Georgann Housekeeper, MD  Brief Narrative: Courtney [name] is a 75 y.o. female with a history of HTN, PE no longer on anticoagulation, OSA on nocturnal oxygen, and bipolar disorder who presented for surgical management of cervical spondylosis and stenosis, undergoing C3-C6 ACDF on 6/8. She's had hypoxic respiratory failure followed by PCCM in ICU, subsequently transferred to PCU on 6/10 with subsequent re in WBC, CXR demonstrating LLL opacity concerning for infection, suspicious for aspiration pneumonia in setting of throat irritation/dysphagia. Medicine was consulted 6/13 for hypoxia, hyponatremia, and AMS all of which have improved. She was cleared for discharge 6/15, though insurance authorization remains pending.  Final Recommendations:  - Continue 5 days of antibiotics, can transition to augmentin at DC - Recheck BMP within the next week to follow sodium level  - Continue weaning supplemental daytime oxygen, and continue her baseline nocturnal CPAP with oxygen. No changes to home medications required from medicine perspective.  - Continue incentive spirometry - Thank you for involving [place] in the care of this patient. Please contact [facility] if we can be of any further assistance.  Tyrone Nine, MD [facility] [email or web address] 04/05/2021, 6:43 AM

## 2021-04-06 DIAGNOSIS — E871 Hypo-osmolality and hyponatremia: Secondary | ICD-10-CM | POA: Diagnosis not present

## 2021-04-06 DIAGNOSIS — G4730 Sleep apnea, unspecified: Secondary | ICD-10-CM | POA: Diagnosis not present

## 2021-04-06 DIAGNOSIS — M4712 Other spondylosis with myelopathy, cervical region: Secondary | ICD-10-CM | POA: Diagnosis not present

## 2021-04-06 DIAGNOSIS — N3281 Overactive bladder: Secondary | ICD-10-CM | POA: Diagnosis not present

## 2021-04-06 DIAGNOSIS — W19XXXA Unspecified fall, initial encounter: Secondary | ICD-10-CM | POA: Diagnosis not present

## 2021-04-06 DIAGNOSIS — E039 Hypothyroidism, unspecified: Secondary | ICD-10-CM | POA: Diagnosis not present

## 2021-04-06 DIAGNOSIS — F319 Bipolar disorder, unspecified: Secondary | ICD-10-CM | POA: Diagnosis not present

## 2021-04-06 DIAGNOSIS — R5383 Other fatigue: Secondary | ICD-10-CM | POA: Diagnosis not present

## 2021-04-06 DIAGNOSIS — S8002XA Contusion of left knee, initial encounter: Secondary | ICD-10-CM | POA: Diagnosis not present

## 2021-04-06 DIAGNOSIS — J690 Pneumonitis due to inhalation of food and vomit: Secondary | ICD-10-CM | POA: Diagnosis not present

## 2021-04-06 DIAGNOSIS — Z6839 Body mass index (BMI) 39.0-39.9, adult: Secondary | ICD-10-CM | POA: Diagnosis not present

## 2021-04-06 DIAGNOSIS — Z743 Need for continuous supervision: Secondary | ICD-10-CM | POA: Diagnosis not present

## 2021-04-06 DIAGNOSIS — F419 Anxiety disorder, unspecified: Secondary | ICD-10-CM | POA: Diagnosis not present

## 2021-04-06 DIAGNOSIS — M25562 Pain in left knee: Secondary | ICD-10-CM | POA: Diagnosis not present

## 2021-04-06 DIAGNOSIS — R278 Other lack of coordination: Secondary | ICD-10-CM | POA: Diagnosis not present

## 2021-04-06 DIAGNOSIS — M4722 Other spondylosis with radiculopathy, cervical region: Secondary | ICD-10-CM | POA: Diagnosis not present

## 2021-04-06 DIAGNOSIS — G4733 Obstructive sleep apnea (adult) (pediatric): Secondary | ICD-10-CM | POA: Diagnosis not present

## 2021-04-06 DIAGNOSIS — E785 Hyperlipidemia, unspecified: Secondary | ICD-10-CM | POA: Diagnosis not present

## 2021-04-06 DIAGNOSIS — K5900 Constipation, unspecified: Secondary | ICD-10-CM | POA: Diagnosis not present

## 2021-04-06 DIAGNOSIS — Z96652 Presence of left artificial knee joint: Secondary | ICD-10-CM | POA: Diagnosis not present

## 2021-04-06 DIAGNOSIS — R531 Weakness: Secondary | ICD-10-CM | POA: Diagnosis not present

## 2021-04-06 DIAGNOSIS — I10 Essential (primary) hypertension: Secondary | ICD-10-CM | POA: Diagnosis not present

## 2021-04-06 DIAGNOSIS — R2689 Other abnormalities of gait and mobility: Secondary | ICD-10-CM | POA: Diagnosis not present

## 2021-04-06 DIAGNOSIS — M542 Cervicalgia: Secondary | ICD-10-CM | POA: Diagnosis not present

## 2021-04-06 DIAGNOSIS — F3170 Bipolar disorder, currently in remission, most recent episode unspecified: Secondary | ICD-10-CM | POA: Diagnosis not present

## 2021-04-06 NOTE — TOC Transition Note (Signed)
Transition of Care [facility]) - CM/SW Discharge Note   Patient Details  Name: Courtney [name] MRN: [id number] Date of Birth: [birth year]  Transition of Care [facility]) CM/SW Contact:  Carley Hammed, LCSWA Phone Number: 04/06/2021, 11:08 AM   Clinical Narrative:    Pt to be transported to [facility] via [facility]. Nurse to call report to [phone] Pt going to room 603A   Final next level of care: Skilled Nursing Facility Barriers to Discharge: Barriers Resolved   Patient Goals and CMS Choice Patient states their goals for this hospitalization and ongoing recovery are:: Patient wants to get out of the hospital and on to rehab CMS Medicare.gov Compare Post Acute Care list provided to:: Patient Choice offered to / list presented to : Patient  Discharge Placement              Patient chooses bed at: [facility] Patient to be transferred to facility by: [facility] Name of family member notified: Gaetana Michaelis Patient and family notified of of transfer: 04/06/21  Discharge Plan and Services In-house Referral: NA Discharge Planning Services: NA Post Acute Care Choice: Skilled Nursing Facility          DME Arranged: N/A DME Agency: NA       HH Arranged: NA HH Agency: NA        Social Determinants of Health (SDOH) Interventions     Readmission Risk Interventions No flowsheet data found.

## 2021-04-06 NOTE — Progress Notes (Signed)
Occupational Therapy Treatment Patient Details Name: Courtney [name] MRN: [id number] DOB: 21-Dec-1945 Today's Date: 04/06/2021    History of present illness Pt is a 75 y/o female who presents s/p C3-C6 ACDF on 03/28/2021. on 6/9 pt with new onset stridor and difficulty breathing, required 2L O2 and transferred to ICU. PMH significant for bipolar disorder, HTN, mitral valve prolapse, claustrophobia, CVA, vertigo, PE, prior back surgery, B knee replacements.   OT comments  Pt progressing towards OT goals. Pt donning socks using AE with min A and min cues for managing sock aid. Pt performing grooming at sink with min guard-min A. Pt requiring min guard A for sitting and standing balance with intermittent min A and mod-max cues to maintain upright posture throughout session. Requiring one seated rest break. Pt continues to present with decreased activity tolerance, strength, and cognition. Pt motivated to participate in therapy. Continue to recommend follow up OT services at SNF.    Follow Up Recommendations  SNF    Equipment Recommendations  None recommended by OT    Recommendations for Other Services      Precautions / Restrictions Precautions Precautions: Fall;Cervical;Other (comment) (O2) Precaution Booklet Issued: No Precaution Comments: Pt requiring min verbal cues to maintain precations lying in bed without brace. Required Braces or Orthoses: Cervical Brace Cervical Brace: Hard collar Restrictions Weight Bearing Restrictions: No       Mobility Bed Mobility Overal bed mobility: Needs Assistance Bed Mobility: Rolling;Sidelying to Sit Rolling: Min assist Sidelying to sit: Min assist       General bed mobility comments: Pt performing log roll with cues with min A for rolling and trunk elevation.    Transfers Overall transfer level: Needs assistance Equipment used: Rolling walker (2 wheeled) Transfers: Sit to/from Stand Sit to Stand: Min assist         General transfer  comment: min A to power up and step by step cues to lean forward and power up    Balance Overall balance assessment: Needs assistance Sitting-balance support: No upper extremity supported;Feet supported Sitting balance-Leahy Scale: Fair Sitting balance - Comments: pt falling to R and posterior bias with mod-max verbal cues to correct and intermittent min A Postural control: Right lateral lean;Posterior lean Standing balance support: Bilateral upper extremity supported;During functional activity Standing balance-Leahy Scale: Poor Standing balance comment: Reliant on UE support throughout functional mobility. Performing grroming with one UE supported on sink.                           ADL either performed or assessed with clinical judgement   ADL Overall ADL's : Needs assistance/impaired     Grooming: Brushing hair;Min guard;Standing Grooming Details (indicate cue type and reason): Pt brushing hair with min guard and requiring seated rest break folliwing grooming.             Lower Body Dressing: Bed level;With adaptive equipment;Minimal assistance Lower Body Dressing Details (indicate cue type and reason): Pt recalling correct use of sock aid with increased time and min A for placement. Pt requiring verbal cues and intermittent min A to maintain sitting balance while preparing sock aid. Requiring set up for placement of sock aid when lowering to foot.             Functional mobility during ADLs: Minimal assistance;+2 for physical assistance;+2 for safety/equipment;Rolling walker General ADL Comments: Pt recalling use of AE for LB dressing. Requiring min A and verbal cues to maintain sitting balance.  Pt requiring seated rest break after walking to sink and brushing hair. Pt with decreased activity tolerance requiring increased time to complete ADLs.     Vision   Vision Assessment?: No apparent visual deficits   Perception     Praxis      Cognition  Arousal/Alertness: Awake/alert Behavior During Therapy: WFL for tasks assessed/performed;Flat affect Overall Cognitive Status: Impaired/Different from baseline Area of Impairment: Memory;Safety/judgement;Problem solving;Attention;Awareness                   Current Attention Level: Sustained;Selective Memory: Decreased short-term memory;Decreased recall of precautions Following Commands: Follows one step commands with increased time Safety/Judgement: Decreased awareness of safety;Decreased awareness of deficits Awareness: Intellectual Problem Solving: Requires verbal cues General Comments: Pt able to recall precautions but poor adherance moving head left and right when not wearing brace. Pt with decreased adherance to precautions requiring cues. Pt demonstrating awareness of fatigue but unable to verbalize need for a seated rest break.        Exercises     Shoulder Instructions       General Comments BP stable. Husband presnt.    Pertinent Vitals/ Pain       Pain Assessment: Faces Faces Pain Scale: Hurts a little bit Pain Location: neck Pain Descriptors / Indicators: Discomfort Pain Intervention(s): Monitored during session;Limited activity within patient's tolerance  Home Living                                          Prior Functioning/Environment              Frequency  Min 2X/week        Progress Toward Goals  OT Goals(current goals can now be found in the care plan section)  Progress towards OT goals: Progressing toward goals  Acute Rehab OT Goals Patient Stated Goal: To SNF to be able to return to ALF with husband OT Goal Formulation: With patient Time For Goal Achievement: 04/12/21 Potential to Achieve Goals: Good ADL Goals Pt Will Perform Grooming: with supervision;standing Pt Will Perform Upper Body Dressing: with min assist;sitting Pt Will Perform Lower Body Dressing: with min assist;with adaptive equipment;sit to/from  stand Pt Will Transfer to Toilet: with supervision;ambulating;regular height toilet Pt Will Perform Toileting - Clothing Manipulation and hygiene: with supervision;sitting/lateral leans Additional ADL Goal #1: Pt will recall 3/3 precautions independently in preparation for ADLs  Plan Discharge plan remains appropriate    Co-evaluation    PT/OT/SLP Co-Evaluation/Treatment: Yes Reason for Co-Treatment: Complexity of the patient's impairments (multi-system involvement);For patient/therapist safety;To address functional/ADL transfers PT goals addressed during session: Mobility/safety with mobility;Balance;Proper use of DME OT goals addressed during session: ADL's and self-care;Proper use of Adaptive equipment and DME      AM-PAC OT "6 Clicks" Daily Activity     Outcome Measure   Help from another person eating meals?: A Little Help from another person taking care of personal grooming?: A Little Help from another person toileting, which includes using toliet, bedpan, or urinal?: A Lot Help from another person bathing (including washing, rinsing, drying)?: A Lot Help from another person to put on and taking off regular upper body clothing?: A Little Help from another person to put on and taking off regular lower body clothing?: A Lot 6 Click Score: 15    End of Session Equipment Utilized During Treatment: Gait belt;Rolling walker  OT Visit Diagnosis: Unsteadiness on  feet (R26.81);Other abnormalities of gait and mobility (R26.89);Muscle weakness (generalized) (M62.81)   Activity Tolerance Patient tolerated treatment well   Patient Left in chair;with call bell/phone within reach;with family/visitor present   Nurse Communication Mobility status        Time: 7001-7494 OT Time Calculation (min): 33 min  Charges: OT Treatments $Self Care/Home Management : 8-22 mins  Ladene Artist, OTDS    Ladene Artist 04/06/2021, 11:03 AM

## 2021-04-06 NOTE — Progress Notes (Signed)
Physical Therapy Treatment Patient Details Name: Courtney [name] MRN: [id number] DOB: [birth year] Today's Date: 04/06/2021    History of Present Illness Pt is a 74 y/o female who presents s/p C3-C6 ACDF on 03/28/2021. on 6/9 pt with new onset stridor and difficulty breathing, required 2L O2 and transferred to ICU. PMH significant for bipolar disorder, HTN, mitral valve prolapse, claustrophobia, CVA, vertigo, PE, prior back surgery, B knee replacements.    PT Comments    The pt was seen by PT/OT to safely progress OOB mobility as pt has recently needed increased assist and use of stedy for transfers. The pt was able to complete multiple sit-stands as well as short bout of ambulation to bathroom in room. However, pt needing max cues for safety, technique, positioning in RW, and to maintain knees in extension when pt in static standing. Given HEP for LE strengthening and circulation, will continue to benefit from skilled PT at [facility] rehab prior to eventual return to ALF.      Follow Up Recommendations  SNF;Supervision for mobility/OOB     Equipment Recommendations  Rolling walker with 5" wheels    Recommendations for Other Services       Precautions / Restrictions Precautions Precautions: Fall;Cervical;Other (comment) Precaution Booklet Issued: No Precaution Comments: Pt requiring min verbal cues to maintain precations lying in bed without brace. Required Braces or Orthoses: Cervical Brace Cervical Brace: Hard collar Restrictions Weight Bearing Restrictions: No Other Position/Activity Restrictions: L wrist splint for any wt bearing through L wrist, pt with prior fx    Mobility  Bed Mobility Overal bed mobility: Needs Assistance Bed Mobility: Rolling;Sidelying to Sit Rolling: Min assist Sidelying to sit: Min assist       General bed mobility comments: Pt performing log roll with cues with min A for rolling and trunk elevation.    Transfers Overall transfer level: Needs  assistance Equipment used: Rolling walker (2 wheeled) Transfers: Sit to/from Stand Sit to Stand: Min assist         General transfer comment: min A to power up and step by step cues to lean forward and power up  Ambulation/Gait Ambulation/Gait assistance: Min assist Gait Distance (Feet): 15 Feet (+ 10 ft) Assistive device: Rolling walker (2 wheeled) Gait Pattern/deviations: Step-to pattern;Decreased stride length;Shuffle Gait velocity: Decreased Gait velocity interpretation: <1.31 ft/sec, indicative of household ambulator General Gait Details: pt with small steps with minimal clearance and max reliance on BUE support. x2 pt letting go of RW to hold object in environment, max cues for safety with mobility. needing cues for activity tolerance and need for seated rest       Balance Overall balance assessment: Needs assistance Sitting-balance support: No upper extremity supported;Feet supported Sitting balance-Leahy Scale: Fair Sitting balance - Comments: pt falling to R and posterior bias with mod-max verbal cues to correct and intermittent min A Postural control: Right lateral lean;Posterior lean Standing balance support: Bilateral upper extremity supported;During functional activity Standing balance-Leahy Scale: Poor Standing balance comment: Reliant on UE support throughout functional mobility. Performing grroming with one UE supported on sink.                            Cognition Arousal/Alertness: Awake/alert Behavior During Therapy: WFL for tasks assessed/performed;Flat affect Overall Cognitive Status: Impaired/Different from baseline Area of Impairment: Memory;Safety/judgement;Problem solving;Attention;Awareness                   Current Attention Level: Sustained;Selective Memory: Decreased short-term  memory;Decreased recall of precautions Following Commands: Follows one step commands with increased time Safety/Judgement: Decreased awareness of  safety;Decreased awareness of deficits Awareness: Intellectual Problem Solving: Requires verbal cues General Comments: Pt able to recall precautions but poor adherance moving head left and right when not wearing brace. Pt with decreased adherance to precautions requiring cues. Pt demonstrating awareness of fatigue but unable to verbalize need for a seated rest break.      Exercises General Exercises - Lower Extremity Long Arc Quad: AROM;Both;10 reps (with 5 second hold) Heel Raises: AROM;Both;10 reps;Seated    General Comments General comments (skin integrity, edema, etc.): BP stable, husband present, pt reporting she finds it difficult to maintain cervical precautions at time when staff is assisting with mobility      Pertinent Vitals/Pain Pain Assessment: Faces Faces Pain Scale: Hurts a little bit Pain Location: neck Pain Descriptors / Indicators: Discomfort Pain Intervention(s): Limited activity within patient's tolerance;Monitored during session;Repositioned     PT Goals (current goals can now be found in the care plan section) Acute Rehab PT Goals Patient Stated Goal: To SNF to be able to return to ALF with husband PT Goal Formulation: With patient Time For Goal Achievement: 04/05/21 Potential to Achieve Goals: Fair Progress towards PT goals: Progressing toward goals    Frequency    Min 5X/week      PT Plan Current plan remains appropriate    Co-evaluation PT/OT/SLP Co-Evaluation/Treatment: Yes Reason for Co-Treatment: Complexity of the patient's impairments (multi-system involvement);Necessary to address cognition/behavior during functional activity;For patient/therapist safety;To address functional/ADL transfers PT goals addressed during session: Mobility/safety with mobility;Balance;Strengthening/ROM OT goals addressed during session: ADL's and self-care;Proper use of Adaptive equipment and DME      AM-PAC PT "6 Clicks" Mobility   Outcome Measure  Help  needed turning from your back to your side while in a flat bed without using bedrails?: A Little Help needed moving from lying on your back to sitting on the side of a flat bed without using bedrails?: A Lot Help needed moving to and from a bed to a chair (including a wheelchair)?: A Lot Help needed standing up from a chair using your arms (e.g., wheelchair or bedside chair)?: A Lot Help needed to walk in hospital room?: Total Help needed climbing 3-5 steps with a railing? : Total 6 Click Score: 11    End of Session Equipment Utilized During Treatment: Gait belt Activity Tolerance: Patient tolerated treatment well Patient left: with call bell/phone within reach;with bed alarm set;in bed Nurse Communication: Mobility status PT Visit Diagnosis: Unsteadiness on feet (R26.81);Pain     Time: 2992-4268 PT Time Calculation (min) (ACUTE ONLY): 33 min  Charges:  $Gait Training: 8-22 mins                     Courtney [name], PT, DPT   Acute Rehabilitation Department Pager #: [phone]   Courtney [name] 04/06/2021, 11:26 AM

## 2021-04-06 NOTE — Progress Notes (Signed)
Subjective: The patient is alert and pleasant.  She looks well.  She wants to go to the skilled nursing facility.  Objective: Vital signs in last 24 hours: Temp:  [97.6 F (36.4 C)-98.3 F (36.8 C)] 97.6 F (36.4 C) (06/17 0334) Pulse Rate:  [62-82] 74 (06/17 0334) Resp:  [16-18] 16 (06/17 0334) BP: (128-167)/(67-88) 131/73 (06/17 0334) SpO2:  [91 %-96 %] 91 % (06/17 0334) Estimated body mass index is 40.48 kg/m as calculated from the following:   Height as of this encounter: 5\' 2"  (1.575 m).   Weight as of this encounter: 100.4 kg.   Intake/Output from previous day: 06/16 0701 - 06/17 0700 In: 480 [P.O.:480] Out: 2200 [Urine:2200] Intake/Output this shift: No intake/output data recorded.  Physical exam the patient is alert and pleasant.  Her wound is healing well.  Her strength is normal.  Lab Results: Recent Labs    04/04/21 0158  WBC 14.4*  HGB 10.4*  HCT 32.6*  PLT 257   BMET No results for input(s): NA, K, CL, CO2, GLUCOSE, BUN, CREATININE, CALCIUM in the last 72 hours.  Studies/Results: No results found.  Assessment/Plan: Postop day #9: Hopefully she will go to the skilled nursing facility today.  I have answered all her questions.  LOS: 9 days     04/06/21 04/06/2021, 7:01 AM

## 2021-04-06 NOTE — Care Management Important Message (Signed)
Important Message  Patient Details  Name: Courtney [name] MRN: [id number] Date of Birth: 10-21-1946   Medicare Important Message Given:  Yes  Patient left prior to IM delivery .  Will mail IM to the patient home address.    [name] 04/06/2021, 2:52 PM

## 2021-04-09 DIAGNOSIS — M4722 Other spondylosis with radiculopathy, cervical region: Secondary | ICD-10-CM | POA: Diagnosis not present

## 2021-04-09 DIAGNOSIS — Z6839 Body mass index (BMI) 39.0-39.9, adult: Secondary | ICD-10-CM | POA: Diagnosis not present

## 2021-04-09 DIAGNOSIS — G4730 Sleep apnea, unspecified: Secondary | ICD-10-CM | POA: Diagnosis not present

## 2021-04-09 DIAGNOSIS — J690 Pneumonitis due to inhalation of food and vomit: Secondary | ICD-10-CM | POA: Diagnosis not present

## 2021-04-09 DIAGNOSIS — F419 Anxiety disorder, unspecified: Secondary | ICD-10-CM | POA: Diagnosis not present

## 2021-04-09 DIAGNOSIS — N3281 Overactive bladder: Secondary | ICD-10-CM | POA: Diagnosis not present

## 2021-04-10 DIAGNOSIS — F3170 Bipolar disorder, currently in remission, most recent episode unspecified: Secondary | ICD-10-CM | POA: Diagnosis not present

## 2021-04-10 DIAGNOSIS — I10 Essential (primary) hypertension: Secondary | ICD-10-CM | POA: Diagnosis not present

## 2021-04-10 DIAGNOSIS — J690 Pneumonitis due to inhalation of food and vomit: Secondary | ICD-10-CM | POA: Diagnosis not present

## 2021-04-10 DIAGNOSIS — G4733 Obstructive sleep apnea (adult) (pediatric): Secondary | ICD-10-CM | POA: Diagnosis not present

## 2021-04-10 DIAGNOSIS — M542 Cervicalgia: Secondary | ICD-10-CM | POA: Diagnosis not present

## 2021-04-12 DIAGNOSIS — J690 Pneumonitis due to inhalation of food and vomit: Secondary | ICD-10-CM | POA: Diagnosis not present

## 2021-04-12 DIAGNOSIS — M4722 Other spondylosis with radiculopathy, cervical region: Secondary | ICD-10-CM | POA: Diagnosis not present

## 2021-04-12 DIAGNOSIS — R5383 Other fatigue: Secondary | ICD-10-CM | POA: Diagnosis not present

## 2021-04-12 DIAGNOSIS — Z6839 Body mass index (BMI) 39.0-39.9, adult: Secondary | ICD-10-CM | POA: Diagnosis not present

## 2021-04-13 DIAGNOSIS — Z96652 Presence of left artificial knee joint: Secondary | ICD-10-CM | POA: Diagnosis not present

## 2021-04-13 DIAGNOSIS — W19XXXA Unspecified fall, initial encounter: Secondary | ICD-10-CM | POA: Diagnosis not present

## 2021-04-13 DIAGNOSIS — S8002XA Contusion of left knee, initial encounter: Secondary | ICD-10-CM | POA: Diagnosis not present

## 2021-04-13 DIAGNOSIS — M25562 Pain in left knee: Secondary | ICD-10-CM | POA: Diagnosis not present

## 2021-04-17 DIAGNOSIS — Z6839 Body mass index (BMI) 39.0-39.9, adult: Secondary | ICD-10-CM | POA: Diagnosis not present

## 2021-04-17 DIAGNOSIS — M4712 Other spondylosis with myelopathy, cervical region: Secondary | ICD-10-CM | POA: Diagnosis not present

## 2021-04-20 DIAGNOSIS — G4733 Obstructive sleep apnea (adult) (pediatric): Secondary | ICD-10-CM | POA: Diagnosis not present

## 2021-04-24 DIAGNOSIS — R2689 Other abnormalities of gait and mobility: Secondary | ICD-10-CM | POA: Diagnosis not present

## 2021-04-24 DIAGNOSIS — R278 Other lack of coordination: Secondary | ICD-10-CM | POA: Diagnosis not present

## 2021-04-24 DIAGNOSIS — M4722 Other spondylosis with radiculopathy, cervical region: Secondary | ICD-10-CM | POA: Diagnosis not present

## 2021-04-25 DIAGNOSIS — M4722 Other spondylosis with radiculopathy, cervical region: Secondary | ICD-10-CM | POA: Diagnosis not present

## 2021-04-25 DIAGNOSIS — R278 Other lack of coordination: Secondary | ICD-10-CM | POA: Diagnosis not present

## 2021-04-25 DIAGNOSIS — R2689 Other abnormalities of gait and mobility: Secondary | ICD-10-CM | POA: Diagnosis not present

## 2021-04-27 DIAGNOSIS — R278 Other lack of coordination: Secondary | ICD-10-CM | POA: Diagnosis not present

## 2021-04-27 DIAGNOSIS — R2689 Other abnormalities of gait and mobility: Secondary | ICD-10-CM | POA: Diagnosis not present

## 2021-04-27 DIAGNOSIS — M4722 Other spondylosis with radiculopathy, cervical region: Secondary | ICD-10-CM | POA: Diagnosis not present

## 2021-05-01 DIAGNOSIS — S52552D Other extraarticular fracture of lower end of left radius, subsequent encounter for closed fracture with routine healing: Secondary | ICD-10-CM | POA: Diagnosis not present

## 2021-05-03 DIAGNOSIS — R2689 Other abnormalities of gait and mobility: Secondary | ICD-10-CM | POA: Diagnosis not present

## 2021-05-03 DIAGNOSIS — M4722 Other spondylosis with radiculopathy, cervical region: Secondary | ICD-10-CM | POA: Diagnosis not present

## 2021-05-03 DIAGNOSIS — R278 Other lack of coordination: Secondary | ICD-10-CM | POA: Diagnosis not present

## 2021-05-15 DIAGNOSIS — R2689 Other abnormalities of gait and mobility: Secondary | ICD-10-CM | POA: Diagnosis not present

## 2021-05-15 DIAGNOSIS — R278 Other lack of coordination: Secondary | ICD-10-CM | POA: Diagnosis not present

## 2021-05-15 DIAGNOSIS — M4722 Other spondylosis with radiculopathy, cervical region: Secondary | ICD-10-CM | POA: Diagnosis not present

## 2021-05-22 DIAGNOSIS — M4722 Other spondylosis with radiculopathy, cervical region: Secondary | ICD-10-CM | POA: Diagnosis not present

## 2021-05-24 DIAGNOSIS — M4722 Other spondylosis with radiculopathy, cervical region: Secondary | ICD-10-CM | POA: Diagnosis not present

## 2021-05-29 DIAGNOSIS — I639 Cerebral infarction, unspecified: Secondary | ICD-10-CM | POA: Diagnosis not present

## 2021-05-29 DIAGNOSIS — I10 Essential (primary) hypertension: Secondary | ICD-10-CM | POA: Diagnosis not present

## 2021-05-29 DIAGNOSIS — G4733 Obstructive sleep apnea (adult) (pediatric): Secondary | ICD-10-CM | POA: Diagnosis not present

## 2021-05-30 DIAGNOSIS — M4722 Other spondylosis with radiculopathy, cervical region: Secondary | ICD-10-CM | POA: Diagnosis not present

## 2021-06-06 DIAGNOSIS — G4733 Obstructive sleep apnea (adult) (pediatric): Secondary | ICD-10-CM | POA: Diagnosis not present

## 2021-06-28 DIAGNOSIS — Z03818 Encounter for observation for suspected exposure to other biological agents ruled out: Secondary | ICD-10-CM | POA: Diagnosis not present

## 2021-06-28 DIAGNOSIS — J029 Acute pharyngitis, unspecified: Secondary | ICD-10-CM | POA: Diagnosis not present

## 2021-07-02 DIAGNOSIS — E039 Hypothyroidism, unspecified: Secondary | ICD-10-CM | POA: Diagnosis not present

## 2021-07-02 DIAGNOSIS — E785 Hyperlipidemia, unspecified: Secondary | ICD-10-CM | POA: Diagnosis not present

## 2021-07-02 DIAGNOSIS — Z23 Encounter for immunization: Secondary | ICD-10-CM | POA: Diagnosis not present

## 2021-07-02 DIAGNOSIS — J45909 Unspecified asthma, uncomplicated: Secondary | ICD-10-CM | POA: Diagnosis not present

## 2021-07-02 DIAGNOSIS — I10 Essential (primary) hypertension: Secondary | ICD-10-CM | POA: Diagnosis not present

## 2021-07-02 DIAGNOSIS — G8190 Hemiplegia, unspecified affecting unspecified side: Secondary | ICD-10-CM | POA: Diagnosis not present

## 2021-07-02 DIAGNOSIS — K219 Gastro-esophageal reflux disease without esophagitis: Secondary | ICD-10-CM | POA: Diagnosis not present

## 2021-07-02 DIAGNOSIS — G4733 Obstructive sleep apnea (adult) (pediatric): Secondary | ICD-10-CM | POA: Diagnosis not present

## 2021-07-02 DIAGNOSIS — R42 Dizziness and giddiness: Secondary | ICD-10-CM | POA: Diagnosis not present

## 2021-07-17 DIAGNOSIS — M4712 Other spondylosis with myelopathy, cervical region: Secondary | ICD-10-CM | POA: Diagnosis not present

## 2021-07-17 DIAGNOSIS — Z981 Arthrodesis status: Secondary | ICD-10-CM | POA: Diagnosis not present

## 2021-08-03 ENCOUNTER — Ambulatory Visit: Payer: Medicare HMO

## 2021-08-03 ENCOUNTER — Other Ambulatory Visit: Payer: Medicare HMO

## 2021-08-29 ENCOUNTER — Other Ambulatory Visit: Payer: Medicare HMO

## 2021-08-29 ENCOUNTER — Ambulatory Visit: Payer: Medicare HMO

## 2021-10-04 DIAGNOSIS — E039 Hypothyroidism, unspecified: Secondary | ICD-10-CM | POA: Diagnosis not present

## 2021-10-04 DIAGNOSIS — M1990 Unspecified osteoarthritis, unspecified site: Secondary | ICD-10-CM | POA: Diagnosis not present

## 2021-10-04 DIAGNOSIS — J45909 Unspecified asthma, uncomplicated: Secondary | ICD-10-CM | POA: Diagnosis not present

## 2021-10-04 DIAGNOSIS — I639 Cerebral infarction, unspecified: Secondary | ICD-10-CM | POA: Diagnosis not present

## 2021-10-04 DIAGNOSIS — K219 Gastro-esophageal reflux disease without esophagitis: Secondary | ICD-10-CM | POA: Diagnosis not present

## 2021-10-04 DIAGNOSIS — E785 Hyperlipidemia, unspecified: Secondary | ICD-10-CM | POA: Diagnosis not present

## 2021-10-04 DIAGNOSIS — F331 Major depressive disorder, recurrent, moderate: Secondary | ICD-10-CM | POA: Diagnosis not present

## 2021-10-04 DIAGNOSIS — I10 Essential (primary) hypertension: Secondary | ICD-10-CM | POA: Diagnosis not present

## 2021-10-26 DIAGNOSIS — R0602 Shortness of breath: Secondary | ICD-10-CM | POA: Diagnosis not present

## 2021-10-26 DIAGNOSIS — E119 Type 2 diabetes mellitus without complications: Secondary | ICD-10-CM | POA: Diagnosis not present

## 2021-10-26 DIAGNOSIS — I10 Essential (primary) hypertension: Secondary | ICD-10-CM | POA: Diagnosis not present

## 2021-10-26 DIAGNOSIS — E559 Vitamin D deficiency, unspecified: Secondary | ICD-10-CM | POA: Diagnosis not present

## 2021-11-01 IMAGING — DX DG LUMBAR SPINE COMPLETE 4+V
5 series · 5 of 5 positions shown · non-contrast
Comparison: December 12, 2020

CLINICAL DATA: Status post fall.

EXAM:
LUMBAR SPINE - COMPLETE 4+ VIEW

[l-spine ap]
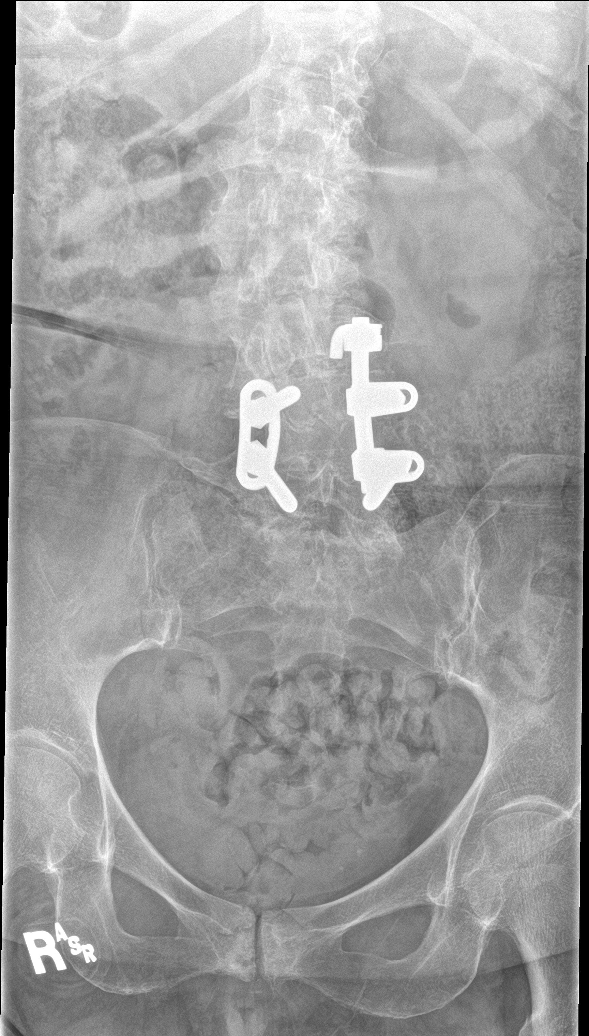

[l-spine obl (1 of 2)]
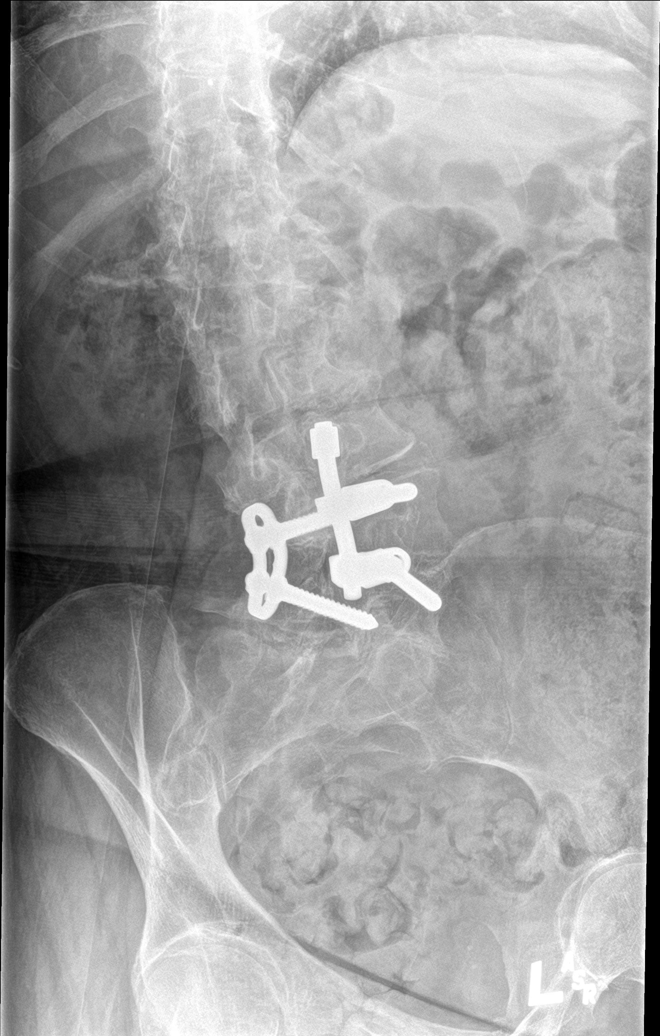

[l-spine lat]
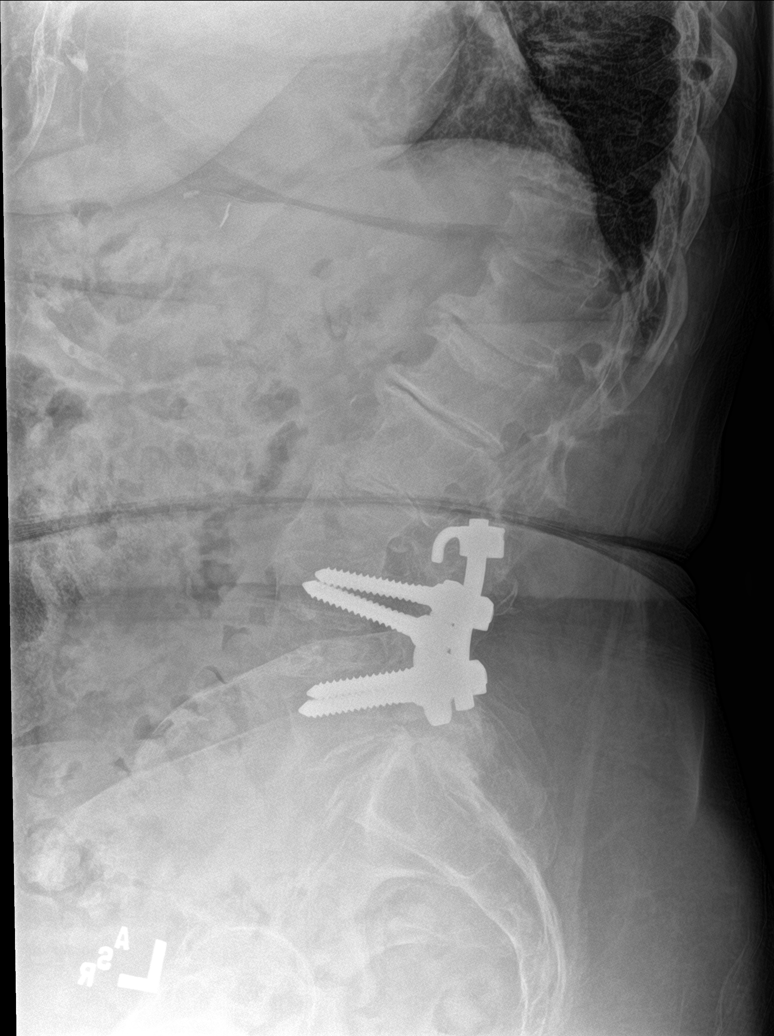

[l-spine spot]
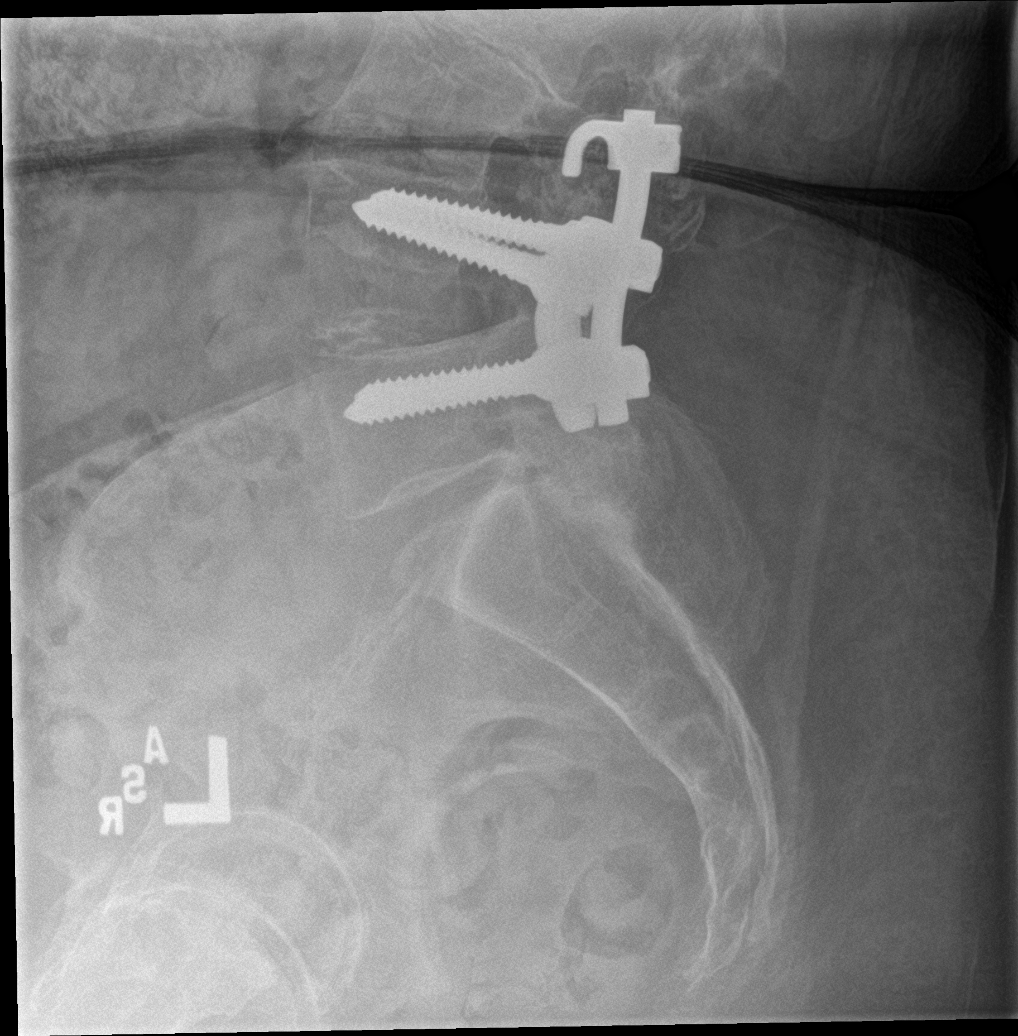

[l-spine obl (2 of 2)]
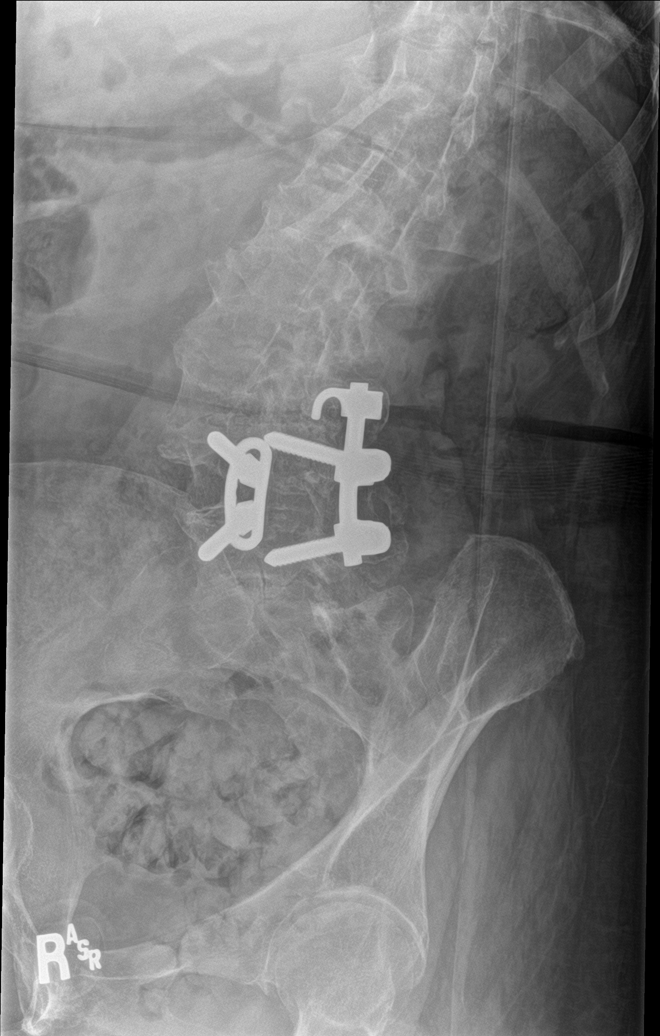

[5 of 5 positions shown; findings below may reference images not displayed]

FINDINGS: There is no evidence of an acute lumbar spine fracture. Mild
dextroscoliosis is seen. Bilateral radiopaque pedicle screws are
noted at the levels of L4 and L5, with a small radiopaque rod seen
involving the posterior elements of L3, L4 and L5 on the left.
Marked severity multilevel endplate sclerosis is seen throughout the
lumbar spine with marked severity multilevel intervertebral disc
space narrowing at the levels of L1-L2 and L2-L3.
IMPRESSION: 1. No evidence of an acute lumbar spine fracture.
2. Extensive chronic, postoperative and degenerative changes, as
described above.

## 2021-11-07 DIAGNOSIS — F331 Major depressive disorder, recurrent, moderate: Secondary | ICD-10-CM | POA: Diagnosis not present

## 2021-11-07 DIAGNOSIS — I639 Cerebral infarction, unspecified: Secondary | ICD-10-CM | POA: Diagnosis not present

## 2021-11-07 DIAGNOSIS — I10 Essential (primary) hypertension: Secondary | ICD-10-CM | POA: Diagnosis not present

## 2021-11-07 DIAGNOSIS — J45909 Unspecified asthma, uncomplicated: Secondary | ICD-10-CM | POA: Diagnosis not present

## 2021-11-07 DIAGNOSIS — K219 Gastro-esophageal reflux disease without esophagitis: Secondary | ICD-10-CM | POA: Diagnosis not present

## 2021-11-07 DIAGNOSIS — M1990 Unspecified osteoarthritis, unspecified site: Secondary | ICD-10-CM | POA: Diagnosis not present

## 2021-11-07 DIAGNOSIS — I2510 Atherosclerotic heart disease of native coronary artery without angina pectoris: Secondary | ICD-10-CM | POA: Diagnosis not present

## 2021-11-07 DIAGNOSIS — E785 Hyperlipidemia, unspecified: Secondary | ICD-10-CM | POA: Diagnosis not present

## 2021-11-07 DIAGNOSIS — E039 Hypothyroidism, unspecified: Secondary | ICD-10-CM | POA: Diagnosis not present

## 2021-11-08 DIAGNOSIS — R351 Nocturia: Secondary | ICD-10-CM | POA: Diagnosis not present

## 2021-11-08 DIAGNOSIS — R350 Frequency of micturition: Secondary | ICD-10-CM | POA: Diagnosis not present

## 2021-11-08 DIAGNOSIS — N3941 Urge incontinence: Secondary | ICD-10-CM | POA: Diagnosis not present

## 2021-11-15 DIAGNOSIS — F331 Major depressive disorder, recurrent, moderate: Secondary | ICD-10-CM | POA: Diagnosis not present

## 2021-11-15 DIAGNOSIS — G8190 Hemiplegia, unspecified affecting unspecified side: Secondary | ICD-10-CM | POA: Diagnosis not present

## 2021-11-15 DIAGNOSIS — E785 Hyperlipidemia, unspecified: Secondary | ICD-10-CM | POA: Diagnosis not present

## 2021-11-15 DIAGNOSIS — I10 Essential (primary) hypertension: Secondary | ICD-10-CM | POA: Diagnosis not present

## 2021-11-15 DIAGNOSIS — F411 Generalized anxiety disorder: Secondary | ICD-10-CM | POA: Diagnosis not present

## 2021-11-15 DIAGNOSIS — I639 Cerebral infarction, unspecified: Secondary | ICD-10-CM | POA: Diagnosis not present

## 2021-11-15 DIAGNOSIS — J45909 Unspecified asthma, uncomplicated: Secondary | ICD-10-CM | POA: Diagnosis not present

## 2021-11-15 DIAGNOSIS — E039 Hypothyroidism, unspecified: Secondary | ICD-10-CM | POA: Diagnosis not present

## 2021-11-16 ENCOUNTER — Emergency Department (HOSPITAL_COMMUNITY)
Admission: EM | Admit: 2021-11-16 | Discharge: 2021-11-16 | Disposition: A | Discharge status: Home / Self Care | Payer: Medicare HMO | Source: Home / Self Care | Attending: Emergency Medicine | Admitting: Emergency Medicine

## 2021-11-16 ENCOUNTER — Emergency Department (HOSPITAL_COMMUNITY): Payer: Medicare HMO

## 2021-11-16 ENCOUNTER — Encounter (HOSPITAL_COMMUNITY): Payer: Self-pay

## 2021-11-16 ENCOUNTER — Other Ambulatory Visit: Payer: Self-pay

## 2021-11-16 DIAGNOSIS — G4489 Other headache syndrome: Secondary | ICD-10-CM | POA: Diagnosis not present

## 2021-11-16 DIAGNOSIS — R0902 Hypoxemia: Secondary | ICD-10-CM | POA: Diagnosis not present

## 2021-11-16 DIAGNOSIS — R059 Cough, unspecified: Secondary | ICD-10-CM | POA: Diagnosis not present

## 2021-11-16 DIAGNOSIS — K219 Gastro-esophageal reflux disease without esophagitis: Secondary | ICD-10-CM | POA: Diagnosis present

## 2021-11-16 DIAGNOSIS — E669 Obesity, unspecified: Secondary | ICD-10-CM | POA: Diagnosis present

## 2021-11-16 DIAGNOSIS — Z86711 Personal history of pulmonary embolism: Secondary | ICD-10-CM | POA: Diagnosis not present

## 2021-11-16 DIAGNOSIS — W19XXXA Unspecified fall, initial encounter: Secondary | ICD-10-CM | POA: Diagnosis not present

## 2021-11-16 DIAGNOSIS — F319 Bipolar disorder, unspecified: Secondary | ICD-10-CM | POA: Diagnosis present

## 2021-11-16 DIAGNOSIS — I517 Cardiomegaly: Secondary | ICD-10-CM | POA: Diagnosis not present

## 2021-11-16 DIAGNOSIS — U071 COVID-19: Secondary | ICD-10-CM | POA: Insufficient documentation

## 2021-11-16 DIAGNOSIS — G4733 Obstructive sleep apnea (adult) (pediatric): Secondary | ICD-10-CM | POA: Diagnosis present

## 2021-11-16 DIAGNOSIS — R112 Nausea with vomiting, unspecified: Secondary | ICD-10-CM | POA: Diagnosis not present

## 2021-11-16 DIAGNOSIS — K529 Noninfective gastroenteritis and colitis, unspecified: Secondary | ICD-10-CM | POA: Diagnosis not present

## 2021-11-16 DIAGNOSIS — Z7982 Long term (current) use of aspirin: Secondary | ICD-10-CM | POA: Diagnosis not present

## 2021-11-16 DIAGNOSIS — R531 Weakness: Secondary | ICD-10-CM | POA: Diagnosis not present

## 2021-11-16 DIAGNOSIS — E875 Hyperkalemia: Secondary | ICD-10-CM | POA: Diagnosis not present

## 2021-11-16 DIAGNOSIS — R7989 Other specified abnormal findings of blood chemistry: Secondary | ICD-10-CM | POA: Diagnosis not present

## 2021-11-16 DIAGNOSIS — Z981 Arthrodesis status: Secondary | ICD-10-CM | POA: Diagnosis not present

## 2021-11-16 DIAGNOSIS — R197 Diarrhea, unspecified: Secondary | ICD-10-CM | POA: Diagnosis not present

## 2021-11-16 DIAGNOSIS — Z79890 Hormone replacement therapy (postmenopausal): Secondary | ICD-10-CM | POA: Diagnosis not present

## 2021-11-16 DIAGNOSIS — K625 Hemorrhage of anus and rectum: Secondary | ICD-10-CM | POA: Diagnosis not present

## 2021-11-16 DIAGNOSIS — I10 Essential (primary) hypertension: Secondary | ICD-10-CM | POA: Diagnosis present

## 2021-11-16 DIAGNOSIS — M19032 Primary osteoarthritis, left wrist: Secondary | ICD-10-CM | POA: Diagnosis present

## 2021-11-16 DIAGNOSIS — I2583 Coronary atherosclerosis due to lipid rich plaque: Secondary | ICD-10-CM | POA: Diagnosis not present

## 2021-11-16 DIAGNOSIS — J45909 Unspecified asthma, uncomplicated: Secondary | ICD-10-CM | POA: Diagnosis present

## 2021-11-16 DIAGNOSIS — R42 Dizziness and giddiness: Secondary | ICD-10-CM | POA: Diagnosis not present

## 2021-11-16 DIAGNOSIS — I2510 Atherosclerotic heart disease of native coronary artery without angina pectoris: Secondary | ICD-10-CM | POA: Diagnosis present

## 2021-11-16 DIAGNOSIS — R109 Unspecified abdominal pain: Secondary | ICD-10-CM | POA: Diagnosis not present

## 2021-11-16 DIAGNOSIS — Z6836 Body mass index (BMI) 36.0-36.9, adult: Secondary | ICD-10-CM | POA: Diagnosis not present

## 2021-11-16 DIAGNOSIS — I341 Nonrheumatic mitral (valve) prolapse: Secondary | ICD-10-CM | POA: Diagnosis present

## 2021-11-16 DIAGNOSIS — E785 Hyperlipidemia, unspecified: Secondary | ICD-10-CM | POA: Diagnosis present

## 2021-11-16 DIAGNOSIS — Z87442 Personal history of urinary calculi: Secondary | ICD-10-CM | POA: Diagnosis not present

## 2021-11-16 DIAGNOSIS — E876 Hypokalemia: Secondary | ICD-10-CM | POA: Diagnosis not present

## 2021-11-16 DIAGNOSIS — A0839 Other viral enteritis: Secondary | ICD-10-CM | POA: Diagnosis present

## 2021-11-16 DIAGNOSIS — N179 Acute kidney failure, unspecified: Secondary | ICD-10-CM | POA: Diagnosis present

## 2021-11-16 DIAGNOSIS — Z79899 Other long term (current) drug therapy: Secondary | ICD-10-CM | POA: Diagnosis not present

## 2021-11-16 DIAGNOSIS — E039 Hypothyroidism, unspecified: Secondary | ICD-10-CM | POA: Diagnosis present

## 2021-11-16 DIAGNOSIS — Z9049 Acquired absence of other specified parts of digestive tract: Secondary | ICD-10-CM | POA: Diagnosis not present

## 2021-11-16 DIAGNOSIS — K5730 Diverticulosis of large intestine without perforation or abscess without bleeding: Secondary | ICD-10-CM | POA: Diagnosis not present

## 2021-11-16 DIAGNOSIS — M19031 Primary osteoarthritis, right wrist: Secondary | ICD-10-CM | POA: Diagnosis present

## 2021-11-16 DIAGNOSIS — J9811 Atelectasis: Secondary | ICD-10-CM | POA: Diagnosis not present

## 2021-11-16 LAB — CBC WITH DIFFERENTIAL/PLATELET
Abs Immature Granulocytes: 0.01 10*3/uL (ref 0.00–0.07)
Basophils Absolute: 0.1 10*3/uL (ref 0.0–0.1)
Basophils Relative: 1 %
Eosinophils Absolute: 0.1 10*3/uL (ref 0.0–0.5)
Eosinophils Relative: 1 %
HCT: 38.2 % (ref 36.0–46.0)
Hemoglobin: 12.1 g/dL (ref 12.0–15.0)
Immature Granulocytes: 0 %
Lymphocytes Relative: 14 %
Lymphs Abs: 0.9 10*3/uL (ref 0.7–4.0)
MCH: 29.2 pg (ref 26.0–34.0)
MCHC: 31.7 g/dL (ref 30.0–36.0)
MCV: 92.3 fL (ref 80.0–100.0)
Monocytes Absolute: 1.4 10*3/uL — ABNORMAL HIGH (ref 0.1–1.0)
Monocytes Relative: 22 %
Neutro Abs: 3.9 10*3/uL (ref 1.7–7.7)
Neutrophils Relative %: 62 %
Platelets: 174 10*3/uL (ref 150–400)
RBC: 4.14 MIL/uL (ref 3.87–5.11)
RDW: 13.2 % (ref 11.5–15.5)
WBC: 6.3 10*3/uL (ref 4.0–10.5)
nRBC: 0 % (ref 0.0–0.2)

## 2021-11-16 LAB — COMPREHENSIVE METABOLIC PANEL
ALT: 15 U/L (ref 0–44)
AST: 17 U/L (ref 15–41)
Albumin: 3.3 g/dL — ABNORMAL LOW (ref 3.5–5.0)
Alkaline Phosphatase: 73 U/L (ref 38–126)
Anion gap: 7 (ref 5–15)
BUN: 12 mg/dL (ref 8–23)
CO2: 27 mmol/L (ref 22–32)
Calcium: 9 mg/dL (ref 8.9–10.3)
Chloride: 103 mmol/L (ref 98–111)
Creatinine, Ser: 0.8 mg/dL (ref 0.44–1.00)
GFR, Estimated: >60 mL/min (ref >60)
Glucose, Bld: 91 mg/dL (ref 70–99)
Potassium: 3.8 mmol/L (ref 3.5–5.1)
Sodium: 137 mmol/L (ref 135–145)
Total Bilirubin: 0.4 mg/dL (ref 0.3–1.2)
Total Protein: 6 g/dL — ABNORMAL LOW (ref 6.5–8.1)

## 2021-11-16 LAB — URINALYSIS, ROUTINE W REFLEX MICROSCOPIC
Bilirubin Urine: NEGATIVE
Glucose, UA: NEGATIVE mg/dL
Hgb urine dipstick: NEGATIVE
Ketones, ur: NEGATIVE mg/dL
Nitrite: NEGATIVE
Protein, ur: NEGATIVE mg/dL
Specific Gravity, Urine: 1.008 (ref 1.005–1.030)
pH: 5 (ref 5.0–8.0)

## 2021-11-16 MED ORDER — NIRMATRELVIR/RITONAVIR (PAXLOVID)TABLET: 3.0000 | ORAL_TABLET | Freq: Two times a day (BID) | ORAL | 0 refills | Status: DC

## 2021-11-16 MED ORDER — IBUPROFEN 400 MG PO TABS
600.0000 mg | ORAL_TABLET | Freq: Once | ORAL | Status: AC
  Administered 2021-11-16: 600 mg via ORAL
  Filled 2021-11-16: qty 1

## 2021-11-16 NOTE — ED Provider Notes (Signed)
[facility] EMERGENCY DEPARTMENT Provider Note   CSN: [id number] Arrival date & time: 11/16/21  0810     History  Chief Complaint  Patient presents with   Covid Positive   Headache   Generalized Body Aches    Courtney [name] is a 76 y.o. female.  Presented to ER with concern for COVID-positive, headache, fever, body aches, generalized weakness, cough.  States that she has had slight cough starting on Tuesday as well as general fatigue.  Symptoms were all relatively quite mild up until yesterday evening things seem to be getting worse.  Developed headache, chills, fever.  T-max 101.5.  Headache frontal, moderate, not sudden onset, not worst headache of her life.  No difficulty in breathing or chest pain.  Some intermittent mild nausea but no vomiting.  Did take Tylenol prior to arrival at 4 AM.  Had a test at her assisted living facility that was positive for COVID-19.  At home had a episode of slight fall, slid to the ground.  Has been able to bear weight since that time, not having any pain from the fall.  Denies hitting her head or passing out.  HPI     Home Medications Prior to Admission medications   Medication Sig Start Date End Date Taking? Authorizing Provider  nirmatrelvir/ritonavir EUA (PAXLOVID) 20 x 150 MG & 10 x 100MG  TABS Take 3 tablets by mouth 2 (two) times daily for 5 days. Patient GFR is greater than 60. Take nirmatrelvir (150 mg) two tablets twice daily for 5 days and ritonavir (100 mg) one tablet twice daily for 5 days. 11/16/21 11/21/21 Yes Marchia Diguglielmo, Ellwood Dense, MD  albuterol (VENTOLIN HFA) 108 (90 Base) MCG/ACT inhaler Inhale 1 puff into the lungs every 6 (six) hours as needed. 02/22/21   [provider]  aspirin 325 MG tablet Take 325 mg by mouth daily.    [provider]  atorvastatin (LIPITOR) 80 MG tablet Take 80 mg by mouth daily. 02/21/21   [provider]  Biotin 5000 MCG CAPS Take 5,000 mcg by mouth daily.    [provider]  buPROPion (WELLBUTRIN XL) 150 MG 24 hr tablet Take 150 mg by mouth daily. 05/27/16   [provider]  Cholecalciferol (VITAMIN D3) 25 MCG (1000 UT) CAPS Take 1,000 Units by mouth daily.    [provider]  cyanocobalamin (,VITAMIN B-12,) 1000 MCG/ML injection Inject 1,000 mcg into the muscle every 30 (thirty) days. 02/23/21   [provider]  cyclobenzaprine (FLEXERIL) 10 MG tablet Take 1 tablet (10 mg total) by mouth 3 (three) times daily as needed for muscle spasms. 04/04/21   Viona Gilmore D, NP  diclofenac Sodium (VOLTAREN) 1 % GEL Apply 2 g topically daily. 12/02/20   [provider]  docusate sodium (COLACE) 100 MG capsule Take 200 mg by mouth daily.    [provider]  escitalopram (LEXAPRO) 10 MG tablet Take 10 mg by mouth daily. 11/24/20   [provider]  FLOVENT HFA 44 MCG/ACT inhaler Inhale 2 puffs into the lungs in the morning and at bedtime. 02/22/21   [provider]  gabapentin (NEURONTIN) 100 MG capsule Take 100 mg by mouth in the morning, at noon, and at bedtime. 02/13/21   [provider]  HYDROcodone-acetaminophen (NORCO/VICODIN) 5-325 MG tablet Take 1-2 tablets by mouth every 4 (four) hours as needed for moderate pain. 04/04/21   Viona Gilmore D, NP  levothyroxine (SYNTHROID) 50 MCG tablet Take 50 mcg by  mouth daily before breakfast. 02/09/21   [provider]  magnesium oxide (MAG-OX) 400 MG tablet Take 400 mg by mouth daily.    [provider]  meclizine (ANTIVERT) 25 MG tablet Take 25 mg by mouth 3 (three) times daily as needed for dizziness.    [provider]  melatonin 5 MG TABS Take 5 mg by mouth at bedtime.    [provider]  montelukast (SINGULAIR) 10 MG tablet Take 10 mg by mouth daily. 02/13/21   [provider]  multivitamin-lutein (OCUVITE-LUTEIN) CAPS capsule Take 1 capsule by mouth daily.    [provider]  olmesartan (BENICAR) 40  MG tablet Take 40 mg by mouth daily. 02/21/21   [provider]  OZEMPIC, 0.25 OR 0.5 MG/DOSE, 2 MG/1.5ML SOPN Inject 0.25 mg into the skin every Tuesday. 02/27/21   [provider]  pantoprazole (PROTONIX) 40 MG tablet Take 40 mg by mouth daily. 02/13/21   [provider]  psyllium (REGULOID) 0.52 g capsule Take 1.04 g by mouth daily.    [provider]  solifenacin (VESICARE) 10 MG tablet Take 10 mg by mouth daily. 02/21/21   [provider]  verapamil (CALAN-SR) 120 MG CR tablet Take 360 mg by mouth daily. 02/13/21   [provider]      Allergies    Tetanus-diphtheria toxoids td, Meloxicam, Phentermine, Sulfa antibiotics, and Zithromax [azithromycin]    Review of Systems   Review of Systems  Constitutional:  Positive for chills, fatigue and fever.  HENT:  Negative for ear pain and sore throat.   Eyes:  Negative for pain and visual disturbance.  Respiratory:  Positive for cough. Negative for shortness of breath.   Cardiovascular:  Negative for chest pain and palpitations.  Gastrointestinal:  Positive for nausea. Negative for abdominal pain and vomiting.  Genitourinary:  Negative for dysuria and hematuria.  Musculoskeletal:  Positive for arthralgias and myalgias. Negative for back pain.  Skin:  Negative for color change and rash.  Neurological:  Negative for seizures and syncope.  All other systems reviewed and are negative.  Physical Exam Updated Vital Signs BP 132/74    Pulse 74    Temp 98.9 F (37.2 C)    Resp 18    Ht 5' 2.5" (1.588 m)    Wt 93.9 kg    SpO2 93%    BMI 37.26 kg/m  Physical Exam Vitals and nursing note reviewed.  Constitutional:      General: She is not in acute distress.    Appearance: She is well-developed.  HENT:     Head: Normocephalic and atraumatic.  Eyes:     Conjunctiva/sclera: Conjunctivae normal.  Cardiovascular:     Rate and Rhythm: Normal rate and regular rhythm.     Heart sounds: No murmur  heard. Pulmonary:     Effort: Pulmonary effort is normal. No respiratory distress.     Breath sounds: Normal breath sounds.  Abdominal:     Palpations: Abdomen is soft.     Tenderness: There is no abdominal tenderness.  Musculoskeletal:        General: No swelling.     Cervical back: Neck supple.  Skin:    General: Skin is warm and dry.     Capillary Refill: Capillary refill takes less than 2 seconds.  Neurological:     Comments: AAOx3 CN 2-12 intact, speech clear visual fields intact 5/5 strength in b/l UE and LE Sensation to light touch intact in b/l UE and  LE Normal FNF Normal gait  Psychiatric:        Mood and Affect: Mood normal. Mood is not anxious or depressed.    ED Results / Procedures / Treatments   Labs (all labs ordered are listed, but only abnormal results are displayed) Labs Reviewed  CBC WITH DIFFERENTIAL/PLATELET - Abnormal; Notable for the following components:      Result Value   Monocytes Absolute 1.4 (*)    All other components within normal limits  COMPREHENSIVE METABOLIC PANEL - Abnormal; Notable for the following components:   Total Protein 6.0 (*)    Albumin 3.3 (*)    All other components within normal limits  URINALYSIS, ROUTINE W REFLEX MICROSCOPIC - Abnormal; Notable for the following components:   Leukocytes,Ua MODERATE (*)    Bacteria, UA RARE (*)    Non Squamous Epithelial 0-5 (*)    All other components within normal limits    EKG EKG Interpretation  Date/Time:  Friday November 16 2021 08:47:55 EST Ventricular Rate:  79 PR Interval:  164 QRS Duration: 74 QT Interval:  486 QTC Calculation: 558 R Axis:   49 Text Interpretation: Sinus rhythm Abnormal R-wave progression, early transition Borderline T abnormalities, anterior leads Prolonged QT interval Confirmed by Madalyn Rob [phone]) on 11/16/2021 9:47:17 AM  Radiology DG Chest Portable 1 View  Result Date: 11/16/2021 CLINICAL DATA:  Cough. EXAM: PORTABLE CHEST 1 VIEW  COMPARISON:  04/01/2021 FINDINGS: Chronic elevation of the right hemidiaphragm. Both lungs are clear. Surgical plate in the lower cervical spine. There appears to be surgical hardware in the lower lumbar spine but incompletely evaluated. Heart size is normal. IMPRESSION: 1. No acute cardiopulmonary disease. 2. Chronic elevation of the right hemidiaphragm. Electronically Signed   By: Markus Daft M.D.   On: 11/16/2021 08:45    Procedures Procedures    Medications Ordered in ED Medications  ibuprofen (ADVIL) tablet 600 mg (600 mg Oral Given 11/16/21 1016)    ED Course/ Medical Decision Making/ A&P                           Medical Decision Making Amount and/or Complexity of Data Reviewed Labs: ordered. Radiology: ordered.   76 year old lady presenting to ER with concern for fever, chills, headache, body aches.  On exam she is well-appearing in no acute distress.  Her vital signs are within normal limits.  No hypoxia or tachypnea or increased work of breathing.  Symptoms are consistent with COVID-19 which she reportedly tested positive for earlier today.  Check basic lab work which was grossly within normal limits, no anemia or electrolyte derangement.  CXR independently reviewed and agree with radiology report, no acute findings, no evidence for pneumonia present.  UA negative for infection.  Cardiac monitor demonstrating sinus rhythm with normal rate.  Patient able to ambulate without difficulty, normal pulse ox with ambulation.  Feel she is appropriate for discharge and outpatient management and does not require admission at this time.  Given her medical comorbidities, age, will prescribe Paxlovid, advised holding statin.  Discussed with pharmacist and reviewed med list to check for any other contraindications.  Reviewed return precautions with patient as well as husband at bedside.  Recommended recheck with primary doctor.    After the discussed management above, the patient was determined to  be safe for discharge.  The patient was in agreement with this plan and all questions regarding their care were answered.  ED return precautions were discussed  and the patient will return to the ED with any significant worsening of condition.  Courtney [name] was evaluated in Emergency Department on 11/17/2021 for the symptoms described in the history of present illness. She was evaluated in the context of the global COVID-19 pandemic, which necessitated consideration that the patient might be at risk for infection with the SARS-CoV-2 virus that causes COVID-19. Institutional protocols and algorithms that pertain to the evaluation of patients at risk for COVID-19 are in a state of rapid change based on information released by regulatory bodies including the CDC and federal and state organizations. These policies and algorithms were followed during the patient's care in the ED.         Final Clinical Impression(s) / ED Diagnoses Final diagnoses:  COVID-19    Rx / DC Orders ED Discharge Orders          Ordered    nirmatrelvir/ritonavir EUA (PAXLOVID) 20 x 150 MG & 10 x 100MG  TABS  2 times daily        11/16/21 1247              Lucrezia Starch, MD 11/17/21 1514

## 2021-11-16 NOTE — Discharge Instructions (Addendum)
Follow-up with your primary care doctor.  Take the prescribed Paxlovid for your COVID.  Follow CDC guidelines regarding isolation precautions.  If you develop difficulty breathing, chest pain, vomiting, or other new concerning symptom, come back to ER for reassessment.  For fever, body aches, recommend taking Tylenol and Motrin as needed.  While taking the Paxlovid -you need to hold the atorvastatin (Lipitor).  These medications can interact.  Once you have finished the course of the antiviral medicine you can resume your statin cholesterol medicine.

## 2021-11-16 NOTE — ED Triage Notes (Signed)
Pt arrives from [facility] facility. Reports testing positive for COVID this am. Pt c/o fever of 101.5, HA, generalized weakness, and dry cough x a few days. Took 1 g of tylenol around 0400.

## 2021-11-16 NOTE — ED Notes (Signed)
Assisted pt up with walker. 2 person stand by assist for safety. Pt used walker with slow steady gait. SpO2 maintained above 96% RA. Pt endorsed nausea. MD made aware. Pt prefers to take personal zofran. Headache 7/10. MD aware.

## 2021-11-18 ENCOUNTER — Encounter (HOSPITAL_COMMUNITY): Payer: Self-pay

## 2021-11-18 ENCOUNTER — Other Ambulatory Visit: Payer: Self-pay

## 2021-11-18 ENCOUNTER — Emergency Department (HOSPITAL_COMMUNITY): Payer: Medicare HMO

## 2021-11-18 ENCOUNTER — Inpatient Hospital Stay (HOSPITAL_COMMUNITY)
Admission: EM | Admit: 2021-11-18 | Discharge: 2021-11-24 | DRG: 178 | Disposition: A | Discharge status: Home Health | Payer: Medicare HMO | Attending: Internal Medicine | Admitting: Internal Medicine

## 2021-11-18 DIAGNOSIS — Z79890 Hormone replacement therapy (postmenopausal): Secondary | ICD-10-CM

## 2021-11-18 DIAGNOSIS — E785 Hyperlipidemia, unspecified: Secondary | ICD-10-CM | POA: Diagnosis present

## 2021-11-18 DIAGNOSIS — M19031 Primary osteoarthritis, right wrist: Secondary | ICD-10-CM | POA: Diagnosis present

## 2021-11-18 DIAGNOSIS — A0839 Other viral enteritis: Secondary | ICD-10-CM | POA: Diagnosis present

## 2021-11-18 DIAGNOSIS — E875 Hyperkalemia: Secondary | ICD-10-CM | POA: Diagnosis not present

## 2021-11-18 DIAGNOSIS — F319 Bipolar disorder, unspecified: Secondary | ICD-10-CM | POA: Diagnosis present

## 2021-11-18 DIAGNOSIS — E669 Obesity, unspecified: Secondary | ICD-10-CM | POA: Diagnosis present

## 2021-11-18 DIAGNOSIS — R7989 Other specified abnormal findings of blood chemistry: Secondary | ICD-10-CM | POA: Diagnosis not present

## 2021-11-18 DIAGNOSIS — I2583 Coronary atherosclerosis due to lipid rich plaque: Secondary | ICD-10-CM | POA: Diagnosis not present

## 2021-11-18 DIAGNOSIS — I10 Essential (primary) hypertension: Secondary | ICD-10-CM | POA: Diagnosis present

## 2021-11-18 DIAGNOSIS — G4733 Obstructive sleep apnea (adult) (pediatric): Secondary | ICD-10-CM | POA: Diagnosis present

## 2021-11-18 DIAGNOSIS — K625 Hemorrhage of anus and rectum: Secondary | ICD-10-CM | POA: Diagnosis not present

## 2021-11-18 DIAGNOSIS — Z86711 Personal history of pulmonary embolism: Secondary | ICD-10-CM

## 2021-11-18 DIAGNOSIS — K529 Noninfective gastroenteritis and colitis, unspecified: Secondary | ICD-10-CM | POA: Diagnosis present

## 2021-11-18 DIAGNOSIS — U071 COVID-19: Secondary | ICD-10-CM | POA: Diagnosis not present

## 2021-11-18 DIAGNOSIS — Z6836 Body mass index (BMI) 36.0-36.9, adult: Secondary | ICD-10-CM | POA: Diagnosis not present

## 2021-11-18 DIAGNOSIS — Z981 Arthrodesis status: Secondary | ICD-10-CM

## 2021-11-18 DIAGNOSIS — I341 Nonrheumatic mitral (valve) prolapse: Secondary | ICD-10-CM | POA: Diagnosis present

## 2021-11-18 DIAGNOSIS — Z8673 Personal history of transient ischemic attack (TIA), and cerebral infarction without residual deficits: Secondary | ICD-10-CM

## 2021-11-18 DIAGNOSIS — M19032 Primary osteoarthritis, left wrist: Secondary | ICD-10-CM | POA: Diagnosis present

## 2021-11-18 DIAGNOSIS — E876 Hypokalemia: Secondary | ICD-10-CM | POA: Diagnosis not present

## 2021-11-18 DIAGNOSIS — E039 Hypothyroidism, unspecified: Secondary | ICD-10-CM | POA: Diagnosis present

## 2021-11-18 DIAGNOSIS — I2510 Atherosclerotic heart disease of native coronary artery without angina pectoris: Secondary | ICD-10-CM | POA: Diagnosis not present

## 2021-11-18 DIAGNOSIS — Z881 Allergy status to other antibiotic agents status: Secondary | ICD-10-CM

## 2021-11-18 DIAGNOSIS — N179 Acute kidney failure, unspecified: Secondary | ICD-10-CM | POA: Diagnosis not present

## 2021-11-18 DIAGNOSIS — J45909 Unspecified asthma, uncomplicated: Secondary | ICD-10-CM | POA: Diagnosis present

## 2021-11-18 DIAGNOSIS — Z888 Allergy status to other drugs, medicaments and biological substances status: Secondary | ICD-10-CM

## 2021-11-18 DIAGNOSIS — Z7982 Long term (current) use of aspirin: Secondary | ICD-10-CM

## 2021-11-18 DIAGNOSIS — K219 Gastro-esophageal reflux disease without esophagitis: Secondary | ICD-10-CM | POA: Diagnosis present

## 2021-11-18 DIAGNOSIS — Z87442 Personal history of urinary calculi: Secondary | ICD-10-CM

## 2021-11-18 DIAGNOSIS — Z87892 Personal history of anaphylaxis: Secondary | ICD-10-CM

## 2021-11-18 DIAGNOSIS — Z79899 Other long term (current) drug therapy: Secondary | ICD-10-CM

## 2021-11-18 LAB — CBC WITH DIFFERENTIAL/PLATELET
Abs Immature Granulocytes: 0.09 10*3/uL — ABNORMAL HIGH (ref 0.00–0.07)
Basophils Absolute: 0 10*3/uL (ref 0.0–0.1)
Basophils Relative: 0 %
Eosinophils Absolute: 0 10*3/uL (ref 0.0–0.5)
Eosinophils Relative: 0 %
HCT: 41.1 % (ref 36.0–46.0)
Hemoglobin: 13 g/dL (ref 12.0–15.0)
Immature Granulocytes: 1 %
Lymphocytes Relative: 5 %
Lymphs Abs: 0.7 10*3/uL (ref 0.7–4.0)
MCH: 29.3 pg (ref 26.0–34.0)
MCHC: 31.6 g/dL (ref 30.0–36.0)
MCV: 92.8 fL (ref 80.0–100.0)
Monocytes Absolute: 1.2 10*3/uL — ABNORMAL HIGH (ref 0.1–1.0)
Monocytes Relative: 8 %
Neutro Abs: 12.6 10*3/uL — ABNORMAL HIGH (ref 1.7–7.7)
Neutrophils Relative %: 86 %
Platelets: 182 10*3/uL (ref 150–400)
RBC: 4.43 MIL/uL (ref 3.87–5.11)
RDW: 13.5 % (ref 11.5–15.5)
WBC: 14.6 10*3/uL — ABNORMAL HIGH (ref 4.0–10.5)
nRBC: 0 % (ref 0.0–0.2)

## 2021-11-18 LAB — COMPREHENSIVE METABOLIC PANEL
ALT: <5 U/L (ref 0–44)
AST: 23 U/L (ref 15–41)
Albumin: 3 g/dL — ABNORMAL LOW (ref 3.5–5.0)
Alkaline Phosphatase: 75 U/L (ref 38–126)
Anion gap: 7 (ref 5–15)
BUN: 19 mg/dL (ref 8–23)
CO2: 27 mmol/L (ref 22–32)
Calcium: 8 mg/dL — ABNORMAL LOW (ref 8.9–10.3)
Chloride: 100 mmol/L (ref 98–111)
Creatinine, Ser: 1.39 mg/dL — ABNORMAL HIGH (ref 0.44–1.00)
GFR, Estimated: 40 mL/min — ABNORMAL LOW (ref >60)
Glucose, Bld: 123 mg/dL — ABNORMAL HIGH (ref 70–99)
Potassium: 3.8 mmol/L (ref 3.5–5.1)
Sodium: 134 mmol/L — ABNORMAL LOW (ref 135–145)
Total Bilirubin: 0.4 mg/dL (ref 0.3–1.2)
Total Protein: 5.7 g/dL — ABNORMAL LOW (ref 6.5–8.1)

## 2021-11-18 LAB — BRAIN NATRIURETIC PEPTIDE: B Natriuretic Peptide: 52.5 pg/mL (ref 0.0–100.0)

## 2021-11-18 LAB — LACTIC ACID, PLASMA
Lactic Acid, Venous: 2.5 mmol/L (ref 0.5–1.9)
Lactic Acid, Venous: 2 mmol/L (ref 0.5–1.9)

## 2021-11-18 LAB — TROPONIN I (HIGH SENSITIVITY)
Troponin I (High Sensitivity): 6 ng/L (ref <18)
Troponin I (High Sensitivity): 8 ng/L (ref <18)

## 2021-11-18 LAB — TYPE AND SCREEN
ABO/RH(D): O POS
Antibody Screen: NEGATIVE

## 2021-11-18 LAB — FERRITIN: Ferritin: 128 ng/mL (ref 11–307)

## 2021-11-18 LAB — C-REACTIVE PROTEIN: CRP: 7.3 mg/dL — ABNORMAL HIGH (ref <1.0)

## 2021-11-18 LAB — FIBRINOGEN: Fibrinogen: 430 mg/dL (ref 210–475)

## 2021-11-18 LAB — D-DIMER, QUANTITATIVE: D-Dimer, Quant: 1.53 ug/mL-FEU — ABNORMAL HIGH (ref 0.00–0.50)

## 2021-11-18 LAB — LIPASE, BLOOD: Lipase: 22 U/L (ref 11–51)

## 2021-11-18 LAB — LACTATE DEHYDROGENASE: LDH: 170 U/L (ref 98–192)

## 2021-11-18 LAB — PROCALCITONIN: Procalcitonin: 5.26 ng/mL

## 2021-11-18 MED ORDER — SODIUM CHLORIDE 0.9% FLUSH
3.0000 mL | Freq: Two times a day (BID) | INTRAVENOUS | Status: DC
  Administered 2021-11-18 – 2021-11-24 (×9): 3 mL via INTRAVENOUS

## 2021-11-18 MED ORDER — SODIUM CHLORIDE 0.9% FLUSH
3.0000 mL | Freq: Two times a day (BID) | INTRAVENOUS | Status: DC
  Administered 2021-11-18 – 2021-11-24 (×13): 3 mL via INTRAVENOUS

## 2021-11-18 MED ORDER — FLUTICASONE PROPIONATE 50 MCG/ACT NA SUSP
2.0000 | Freq: Two times a day (BID) | NASAL | Status: DC
  Administered 2021-11-18 – 2021-11-24 (×12): 2 via NASAL
  Filled 2021-11-18: qty 16

## 2021-11-18 MED ORDER — CYCLOBENZAPRINE HCL 5 MG PO TABS: 10.0000 mg | ORAL_TABLET | Freq: Three times a day (TID) | ORAL | Status: DC | PRN

## 2021-11-18 MED ORDER — LACTATED RINGERS IV BOLUS
1000.0000 mL | Freq: Once | INTRAVENOUS | Status: AC
  Administered 2021-11-18: 1000 mL via INTRAVENOUS

## 2021-11-18 MED ORDER — SODIUM CHLORIDE 0.9 % IV SOLN: 250.0000 mL | INTRAVENOUS | Status: DC | PRN

## 2021-11-18 MED ORDER — OXYCODONE HCL 5 MG PO TABS: 5.0000 mg | ORAL_TABLET | ORAL | Status: DC | PRN

## 2021-11-18 MED ORDER — MELATONIN 5 MG PO TABS
5.0000 mg | ORAL_TABLET | Freq: Every day | ORAL | Status: DC
  Administered 2021-11-18 – 2021-11-23 (×6): 5 mg via ORAL
  Filled 2021-11-18 (×6): qty 1

## 2021-11-18 MED ORDER — ASCORBIC ACID 500 MG PO TABS
500.0000 mg | ORAL_TABLET | Freq: Every day | ORAL | Status: DC
  Administered 2021-11-18 – 2021-11-24 (×7): 500 mg via ORAL
  Filled 2021-11-18 (×7): qty 1

## 2021-11-18 MED ORDER — METRONIDAZOLE 500 MG/100ML IV SOLN
500.0000 mg | Freq: Two times a day (BID) | INTRAVENOUS | Status: DC
  Administered 2021-11-18 – 2021-11-24 (×13): 500 mg via INTRAVENOUS
  Filled 2021-11-18 (×13): qty 100

## 2021-11-18 MED ORDER — PANTOPRAZOLE SODIUM 40 MG PO TBEC
40.0000 mg | DELAYED_RELEASE_TABLET | Freq: Every day | ORAL | Status: DC
  Administered 2021-11-18 – 2021-11-22 (×5): 40 mg via ORAL
  Filled 2021-11-18 (×5): qty 1

## 2021-11-18 MED ORDER — ASPIRIN 325 MG PO TABS
325.0000 mg | ORAL_TABLET | Freq: Every day | ORAL | Status: DC
  Administered 2021-11-18 – 2021-11-24 (×7): 325 mg via ORAL
  Filled 2021-11-18 (×7): qty 1

## 2021-11-18 MED ORDER — FLUTICASONE PROPIONATE HFA 44 MCG/ACT IN AERO
2.0000 | INHALATION_SPRAY | Freq: Two times a day (BID) | RESPIRATORY_TRACT | Status: DC
  Administered 2021-11-18 – 2021-11-24 (×11): 2 via RESPIRATORY_TRACT
  Filled 2021-11-18: qty 10.6

## 2021-11-18 MED ORDER — LEVOTHYROXINE SODIUM 50 MCG PO TABS
50.0000 ug | ORAL_TABLET | Freq: Every day | ORAL | Status: DC
  Administered 2021-11-19 – 2021-11-24 (×6): 50 ug via ORAL
  Filled 2021-11-18 (×6): qty 1

## 2021-11-18 MED ORDER — ENOXAPARIN SODIUM 40 MG/0.4ML IJ SOSY
40.0000 mg | PREFILLED_SYRINGE | INTRAMUSCULAR | Status: DC
  Administered 2021-11-18 – 2021-11-24 (×7): 40 mg via SUBCUTANEOUS
  Filled 2021-11-18 (×7): qty 0.4

## 2021-11-18 MED ORDER — FLUTICASONE PROPIONATE HFA 44 MCG/ACT IN AERO: 2.0000 | INHALATION_SPRAY | Freq: Two times a day (BID) | RESPIRATORY_TRACT | Status: DC

## 2021-11-18 MED ORDER — ZINC SULFATE 220 (50 ZN) MG PO CAPS
220.0000 mg | ORAL_CAPSULE | Freq: Every day | ORAL | Status: DC
  Administered 2021-11-18 – 2021-11-24 (×7): 220 mg via ORAL
  Filled 2021-11-18 (×7): qty 1

## 2021-11-18 MED ORDER — VERAPAMIL HCL ER 180 MG PO TBCR
360.0000 mg | EXTENDED_RELEASE_TABLET | Freq: Every day | ORAL | Status: DC
Start: 2021-11-19 — End: 2021-11-24
  Administered 2021-11-19 – 2021-11-24 (×6): 360 mg via ORAL
  Filled 2021-11-18 (×6): qty 2

## 2021-11-18 MED ORDER — VIBEGRON 75 MG PO TABS: 75.0000 mg | ORAL_TABLET | Freq: Every day | ORAL | Status: DC

## 2021-11-18 MED ORDER — ONDANSETRON HCL 4 MG PO TABS
4.0000 mg | ORAL_TABLET | Freq: Four times a day (QID) | ORAL | Status: DC | PRN
  Administered 2021-11-21: 4 mg via ORAL
  Filled 2021-11-18: qty 1

## 2021-11-18 MED ORDER — GABAPENTIN 100 MG PO CAPS
100.0000 mg | ORAL_CAPSULE | Freq: Three times a day (TID) | ORAL | Status: DC
  Administered 2021-11-18 – 2021-11-24 (×19): 100 mg via ORAL
  Filled 2021-11-18 (×19): qty 1

## 2021-11-18 MED ORDER — METHYLPREDNISOLONE SODIUM SUCC 125 MG IJ SOLR
1.0000 mg/kg | INTRAMUSCULAR | Status: AC
  Administered 2021-11-19 – 2021-11-20 (×2): 93.125 mg via INTRAVENOUS
  Filled 2021-11-18 (×2): qty 2

## 2021-11-18 MED ORDER — MORPHINE SULFATE (PF) 2 MG/ML IV SOLN
2.0000 mg | Freq: Once | INTRAVENOUS | Status: AC
Start: 2021-11-18 — End: 2021-11-18
  Administered 2021-11-18: 2 mg via INTRAVENOUS
  Filled 2021-11-18: qty 1

## 2021-11-18 MED ORDER — MONTELUKAST SODIUM 10 MG PO TABS
10.0000 mg | ORAL_TABLET | Freq: Every day | ORAL | Status: DC
  Administered 2021-11-18 – 2021-11-24 (×7): 10 mg via ORAL
  Filled 2021-11-18 (×7): qty 1

## 2021-11-18 MED ORDER — ATORVASTATIN CALCIUM 80 MG PO TABS
80.0000 mg | ORAL_TABLET | Freq: Every day | ORAL | Status: DC
  Administered 2021-11-18 – 2021-11-24 (×7): 80 mg via ORAL
  Filled 2021-11-18 (×7): qty 1

## 2021-11-18 MED ORDER — GUAIFENESIN-DM 100-10 MG/5ML PO SYRP
10.0000 mL | ORAL_SOLUTION | ORAL | Status: DC | PRN
  Administered 2021-11-21: 10 mL via ORAL
  Filled 2021-11-18: qty 10

## 2021-11-18 MED ORDER — ACETAMINOPHEN 325 MG PO TABS
650.0000 mg | ORAL_TABLET | Freq: Four times a day (QID) | ORAL | Status: DC | PRN
  Administered 2021-11-19 – 2021-11-23 (×3): 650 mg via ORAL
  Filled 2021-11-18 (×3): qty 2

## 2021-11-18 MED ORDER — SODIUM CHLORIDE 0.9 % IV SOLN
2.0000 g | Freq: Once | INTRAVENOUS | Status: AC
  Administered 2021-11-18: 2 g via INTRAVENOUS
  Filled 2021-11-18: qty 20

## 2021-11-18 MED ORDER — LORAZEPAM 1 MG PO TABS
1.0000 mg | ORAL_TABLET | Freq: Every day | ORAL | Status: DC
  Administered 2021-11-18 – 2021-11-23 (×6): 1 mg via ORAL
  Filled 2021-11-18 (×6): qty 1

## 2021-11-18 MED ORDER — HYDROCOD POLI-CHLORPHE POLI ER 10-8 MG/5ML PO SUER
5.0000 mL | Freq: Two times a day (BID) | ORAL | Status: DC | PRN
  Administered 2021-11-21: 5 mL via ORAL
  Filled 2021-11-18: qty 5

## 2021-11-18 MED ORDER — ESCITALOPRAM OXALATE 10 MG PO TABS
10.0000 mg | ORAL_TABLET | Freq: Every day | ORAL | Status: DC
Start: 2021-11-18 — End: 2021-11-24
  Administered 2021-11-18 – 2021-11-24 (×7): 10 mg via ORAL
  Filled 2021-11-18 (×7): qty 1

## 2021-11-18 MED ORDER — ONDANSETRON HCL 4 MG/2ML IJ SOLN
4.0000 mg | Freq: Four times a day (QID) | INTRAMUSCULAR | Status: DC | PRN
  Administered 2021-11-22: 4 mg via INTRAVENOUS
  Filled 2021-11-18: qty 2

## 2021-11-18 MED ORDER — SODIUM CHLORIDE 0.9 % IV SOLN
12.5000 mg | Freq: Four times a day (QID) | INTRAVENOUS | Status: DC | PRN
  Filled 2021-11-18: qty 0.5

## 2021-11-18 MED ORDER — SODIUM CHLORIDE 0.9 % IV SOLN
2.0000 g | INTRAVENOUS | Status: DC
  Administered 2021-11-19 – 2021-11-24 (×6): 2 g via INTRAVENOUS
  Filled 2021-11-18 (×6): qty 20

## 2021-11-18 MED ORDER — PREDNISONE 5 MG PO TABS: 50.0000 mg | ORAL_TABLET | Freq: Every day | ORAL | Status: DC

## 2021-11-18 MED ORDER — SODIUM CHLORIDE 0.9 % IV SOLN
12.5000 mg | Freq: Four times a day (QID) | INTRAVENOUS | Status: DC | PRN
  Administered 2021-11-18: 12.5 mg via INTRAVENOUS
  Filled 2021-11-18 (×2): qty 0.5

## 2021-11-18 MED ORDER — LORAZEPAM 1 MG PO TABS: 1.0000 mg | ORAL_TABLET | Freq: Every day | ORAL | Status: DC

## 2021-11-18 MED ORDER — LACTATED RINGERS IV SOLN: INTRAVENOUS | Status: AC

## 2021-11-18 MED ORDER — LORAZEPAM 1 MG PO TABS
1.0000 mg | ORAL_TABLET | Freq: Every day | ORAL | Status: DC
  Administered 2021-11-18: 1 mg via ORAL
  Filled 2021-11-18: qty 1

## 2021-11-18 MED ORDER — SODIUM CHLORIDE 0.9 % IV SOLN
200.0000 mg | Freq: Once | INTRAVENOUS | Status: AC
  Administered 2021-11-18: 200 mg via INTRAVENOUS
  Filled 2021-11-18: qty 40

## 2021-11-18 MED ORDER — IOHEXOL 350 MG/ML SOLN
80.0000 mL | Freq: Once | INTRAVENOUS | Status: AC | PRN
  Administered 2021-11-18: 80 mL via INTRAVENOUS

## 2021-11-18 MED ORDER — MIRABEGRON ER 25 MG PO TB24
25.0000 mg | ORAL_TABLET | Freq: Every day | ORAL | Status: DC
  Administered 2021-11-18 – 2021-11-24 (×7): 25 mg via ORAL
  Filled 2021-11-18 (×7): qty 1

## 2021-11-18 MED ORDER — ALBUTEROL SULFATE HFA 108 (90 BASE) MCG/ACT IN AERS
2.0000 | INHALATION_SPRAY | RESPIRATORY_TRACT | Status: DC | PRN
  Administered 2021-11-18 – 2021-11-24 (×4): 2 via RESPIRATORY_TRACT
  Filled 2021-11-18: qty 6.7

## 2021-11-18 MED ORDER — SODIUM CHLORIDE 0.9% FLUSH
3.0000 mL | INTRAVENOUS | Status: DC | PRN
  Administered 2021-11-18: 3 mL via INTRAVENOUS

## 2021-11-18 MED ORDER — LORAZEPAM 2 MG/ML IJ SOLN
0.5000 mg | INTRAMUSCULAR | Status: DC | PRN
  Administered 2021-11-18 – 2021-11-20 (×3): 0.5 mg via INTRAVENOUS
  Filled 2021-11-18 (×3): qty 1

## 2021-11-18 MED ORDER — SODIUM CHLORIDE 0.9 % IV SOLN
100.0000 mg | Freq: Every day | INTRAVENOUS | Status: AC
  Administered 2021-11-19 – 2021-11-22 (×4): 100 mg via INTRAVENOUS
  Filled 2021-11-18 (×2): qty 20
  Filled 2021-11-18: qty 100
  Filled 2021-11-18 (×2): qty 20

## 2021-11-18 MED ORDER — METHYLPREDNISOLONE SODIUM SUCC 125 MG IJ SOLR
0.5000 mg/kg | Freq: Two times a day (BID) | INTRAMUSCULAR | Status: DC
  Administered 2021-11-18 (×2): 46.25 mg via INTRAVENOUS
  Filled 2021-11-18 (×3): qty 2

## 2021-11-18 MED ORDER — BUPROPION HCL ER (XL) 150 MG PO TB24
150.0000 mg | ORAL_TABLET | Freq: Every day | ORAL | Status: DC
Start: 2021-11-18 — End: 2021-11-24
  Administered 2021-11-18 – 2021-11-24 (×7): 150 mg via ORAL
  Filled 2021-11-18 (×7): qty 1

## 2021-11-18 NOTE — Assessment & Plan Note (Deleted)
-  Patient with n/v/d -Had hematochezia on admission, this is most likely due to colitis. -Proctocolitis appreciated on imaging, there is possibly bacterial component to it, as well possibly due to COVID, low possibility inflammatory bowel disease as well like UC. -With IV antibiotics for now including IV Rocephin and azithromycin.  -C. difficile negative.

## 2021-11-18 NOTE — Assessment & Plan Note (Addendum)
Continue Synthroid °

## 2021-11-18 NOTE — ED Notes (Signed)
Lab notified of additional orders and confirmed they can be added on.

## 2021-11-18 NOTE — Progress Notes (Signed)
°  Transition of Care [facility]) Screening Note   Patient Details  Name: Courtney [name] Date of Birth: [birth year]   Transition of Care [facility]) CM/SW Contact:    Windle Guard, LCSW Phone Number: 11/18/2021, 2:10 PM    Transition of Care Department [facility]) has reviewed patient and noted noted PT recommendation for SNF and current COVID treatment needs. TOC team will continue to monitor patient advancement through interdisciplinary progression rounds to support any identified discharge supports when medically appropriate. Please reach out to [facility] for change in condition as needed.

## 2021-11-18 NOTE — ED Provider Notes (Signed)
I see the patient in signout from Dr. Dayna Barker, please see his note for further details briefly the patient is a 76 year old female was recently diagnosed as COVID-positive symptoms started about 4 days ago.  Last night developed intractable nausea vomiting diarrhea and abdominal discomfort.  Plan for lab work and CT scan.  Patient had a bloody bowel movements while here.  CT scan consistent with colitis.  Will start on antibiotics.  She is newly hypoxic requiring 2 L of oxygen.  Will discuss with medicine for admission.   Deno Etienne, DO 11/18/21 1004

## 2021-11-18 NOTE — H&P (Signed)
History and Physical    Patient: Courtney [name] DOB: [birth year] DOA: 11/18/2021 DOS: the patient was seen and examined on 11/18/2021 PCP: Wenda Low, MD  Patient coming from: ALF/[facility] - [facility], lives with husband; NOK: Shifra, [place], [phone]   Chief Complaint: Worsening COVID symptoms  HPI: Courtney [name] is a 76 y.o. female with medical history significant of bipolar d/o; HTN; hypothyroidism; class 2 obesity; OSA; PE; and CVA presenting with SOB.  She tested positive for COVID on 1/27.  She was seen in the ER for this on 1/27 and was treated with Paxlovid and discharged.  She returned today with worsening symptoms, O2 75% on RA.  She reports that she initially started with nausea, R ear pain, headache behind her L eye, cough.  She came to the ER and was prescribed Paxlovid but was only able to tolerate 2 doses of the medication due to persistent nausea.  She developed vomiting and diarrhea yesterday.  She has been too weak to stand or move much and has fallen twice.  She has not felt particularly SOB as much as she has felt weak.    ER Course:  COVID, now with colitis - n/v/d.  Still in the ER grossly bloody, Hgb stable.  Started on abx, on day 1 of Paxlovid.  Will stop Paxlovid and start steroids/remdesivir.     Review of Systems: As mentioned in the history of present illness. All other systems reviewed and are negative. Past Medical History:  Diagnosis Date   Anemia    Arthritis    Bilateral Wrists   Asthma    Bipolar disorder ([place])    Diverticulitis    Hiatal hernia    History of kidney stones 2006   Hypertension    Hypothyroidism (acquired) 04/02/2021   Kidney stone    Mitral valve prolapse    OSA (obstructive sleep apnea) 04/02/2021   Panic attack    PONV (postoperative nausea and vomiting)    Pulmonary embolus (HCC)    Stroke [facility])    Vertigo    Past Surgical History:  Procedure Laterality Date   ABDOMINAL HYSTERECTOMY      ANTERIOR CERVICAL DECOMP/DISCECTOMY FUSION N/A 03/28/2021   Procedure: Cervical three-four Cervical four-five Cervical five-six Anterior cervical decompression/discectomy/fusion/interbody prosthesis/plate/screws;  Surgeon: Newman Pies, MD;  Location: [place];  Service: Neurosurgery;  Laterality: N/A;   BACK SURGERY     BREAST REDUCTION SURGERY     CHOLECYSTECTOMY     EYE SURGERY Bilateral 2000   Cataract   JOINT REPLACEMENT Bilateral    Knee replacement   KNEE SURGERY     KNEE SURGERY     TOE SURGERY     TONSILLECTOMY     Social History:  reports that she has never smoked. She has never used smokeless tobacco. She reports that she does not currently use alcohol. She reports that she does not currently use drugs.  Allergies  Allergen Reactions   Tetanus-Diphtheria Toxoids Td Anaphylaxis   Meloxicam Nausea And Vomiting   Phentermine Nausea And Vomiting   Sulfa Antibiotics Diarrhea and Nausea And Vomiting   Zithromax [Azithromycin] Diarrhea and Nausea And Vomiting    No family history on file.  Prior to Admission medications   Medication Sig Start Date End Date Taking? Authorizing Provider  acetaminophen (TYLENOL) 325 MG tablet Take 650 mg by mouth every 6 (six) hours as needed for moderate pain.   Yes [provider]  albuterol (VENTOLIN HFA) 108 (90 Base) MCG/ACT  inhaler Inhale 1 puff into the lungs every 6 (six) hours as needed for wheezing or shortness of breath. 02/22/21  Yes [provider]  aspirin 325 MG tablet Take 325 mg by mouth daily.   Yes [provider]  atorvastatin (LIPITOR) 80 MG tablet Take 80 mg by mouth daily. 02/21/21  Yes [provider]  Biotin 5000 MCG CAPS Take 5,000 mcg by mouth daily.   Yes [provider]  buPROPion (WELLBUTRIN XL) 150 MG 24 hr tablet Take 150 mg by mouth daily. 05/27/16  Yes [provider]  Cholecalciferol (VITAMIN D3) 25 MCG (1000 UT) CAPS Take 1,000 Units by mouth daily.   Yes [provider]  cyanocobalamin (,VITAMIN B-12,) 1000 MCG/ML injection Inject 1,000 mcg into the muscle every 30 (thirty) days. 02/23/21  Yes [provider]  cyclobenzaprine (FLEXERIL) 10 MG tablet Take 1 tablet (10 mg total) by mouth 3 (three) times daily as needed for muscle spasms. 04/04/21  Yes Viona Gilmore D, NP  diclofenac Sodium (VOLTAREN) 1 % GEL Apply 2 g topically daily. 12/02/20  Yes [provider]  docusate sodium (COLACE) 100 MG capsule Take 200 mg by mouth daily.   Yes [provider]  escitalopram (LEXAPRO) 10 MG tablet Take 10 mg by mouth daily. 11/24/20  Yes [provider]  FLOVENT HFA 44 MCG/ACT inhaler Inhale 2 puffs into the lungs in the morning and at bedtime. 02/22/21  Yes [provider]  fluticasone (FLONASE) 50 MCG/ACT nasal spray Place 2 sprays into both nostrils in the morning and at bedtime. 10/29/21  Yes [provider]  gabapentin (NEURONTIN) 100 MG capsule Take 100 mg by mouth in the morning, at noon, and at bedtime. 02/13/21  Yes [provider]  levothyroxine (SYNTHROID) 50 MCG tablet Take 50 mcg by mouth daily before breakfast. 02/09/21  Yes [provider]  LORazepam (ATIVAN) 1 MG tablet Take 1 mg by mouth daily. 11/06/21  Yes [provider]  magnesium oxide (MAG-OX) 400 MG tablet Take 400 mg by mouth daily.   Yes [provider]  meclizine (ANTIVERT) 25 MG tablet Take 25 mg by mouth 3 (three) times daily as needed for dizziness.   Yes [provider]  melatonin 5 MG TABS Take 5 mg by mouth at bedtime.   Yes [provider]  montelukast (SINGULAIR) 10 MG tablet Take 10 mg by mouth daily. 02/13/21  Yes [provider]  multivitamin-lutein (OCUVITE-LUTEIN) CAPS capsule Take 1 capsule by mouth daily.   Yes [provider]  nirmatrelvir/ritonavir EUA (PAXLOVID) 20 x 150 MG & 10 x 100MG  TABS Take 3 tablets by mouth 2 (two) times daily for 5 days. Patient  GFR is greater than 60. Take nirmatrelvir (150 mg) two tablets twice daily for 5 days and ritonavir (100 mg) one tablet twice daily for 5 days. 11/16/21 11/21/21 Yes Dykstra, Ellwood Dense, MD  olmesartan (BENICAR) 40 MG tablet Take 40 mg by mouth daily. 02/21/21  Yes [provider]  ondansetron (ZOFRAN) 4 MG tablet Take 4 mg by mouth every 8 (eight) hours as needed for nausea or vomiting.   Yes [provider]  OZEMPIC, 0.25 OR 0.5 MG/DOSE, 2 MG/1.5ML SOPN Inject 0.25 mg into the skin every Tuesday. 02/27/21  Yes [provider]  pantoprazole (PROTONIX) 40 MG tablet Take 40 mg by mouth daily. 02/13/21  Yes [provider]  psyllium (REGULOID) 0.52 g capsule Take 1.04 g by mouth daily.   Yes [provider]  verapamil (CALAN-SR) 120 MG CR tablet Take 360 mg by mouth daily. 02/13/21  Yes [provider]  Vibegron (GEMTESA) 75 MG TABS Take 75 mg by mouth at bedtime.   Yes [provider]  HYDROcodone-acetaminophen (NORCO/VICODIN) 5-325 MG tablet Take 1-2 tablets by mouth every 4 (four) hours as needed for moderate pain. Patient not taking: Reported on 11/18/2021 04/04/21   Val Eagle D, NP    Physical Exam: Vitals:   11/18/21 0821 11/18/21 0830 11/18/21 0845 11/18/21 0900  BP: 131/65 136/71  118/90  Pulse: 73 75 73 73  Resp: 17 18 18 18   Temp:      TempSrc:      SpO2: 100% 100% 100% 100%  Weight:      Height:       General:  Appears calm and comfortable and is in NAD, moderately ill-appearing, on [place] O2 Eyes:  PERRL, EOMI, normal lids, iris ENT:  grossly normal hearing, lips & tongue, moderately dry mm; appropriate dentition Neck:  no LAD, masses or thyromegaly Cardiovascular:  RRR, no m/r/g. No LE edema.  Respiratory:   CTA bilaterally other than sparse wheezes.  Normal respiratory effort. Abdomen:  soft, diffusely TTP but moreso in B LQ Skin:  no rash or induration seen on limited exam Musculoskeletal:  grossly normal tone BUE/BLE,  good ROM, no bony abnormality Psychiatric:  blunted mood and affect, speech fluent and appropriate, AOx3 Neurologic:  CN 2-12 grossly intact, moves all extremities in coordinated fashion   Radiological Exams on Admission: Independently reviewed - see discussion in A/P where applicable  DG Chest Portable 1 View  Result Date: 11/18/2021 CLINICAL DATA:  DVA. EXAM: PORTABLE CHEST 1 VIEW COMPARISON:  Two days ago FINDINGS: Cardiomegaly and aortic tortuosity. Elevated right diaphragm. Interstitial crowding at the bases. There is no edema, air bronchogram, effusion, or pneumothorax. IMPRESSION: Stable low volume chest with right diaphragm elevation. Electronically Signed   By: Tiburcio Pea M.D.   On: 11/18/2021 06:17   CT Angio Abd/Pel W and/or Wo Contrast  Result Date: 11/18/2021 CLINICAL DATA:  76 year old female with history of lower GI bleed. Nausea, vomiting, diarrhea and abdominal pain. Diagnosed with COVID yesterday morning. Weakness. EXAM: CTA ABDOMEN AND PELVIS WITHOUT AND WITH CONTRAST TECHNIQUE: Multidetector CT imaging of the abdomen and pelvis was performed using the standard protocol during bolus administration of intravenous contrast. Multiplanar reconstructed images and MIPs were obtained and reviewed to evaluate the vascular anatomy. RADIATION DOSE REDUCTION: This exam was performed according to the departmental dose-optimization program which includes automated exposure control, adjustment of the mA and/or kV according to patient size and/or use of iterative reconstruction technique. CONTRAST:  23mL OMNIPAQUE IOHEXOL 350 MG/ML SOLN COMPARISON:  No priors. FINDINGS: VASCULAR Aorta: Aortic atherosclerosis. Normal caliber aorta without aneurysm, dissection, vasculitis or significant stenosis. Celiac: Patent without evidence of aneurysm, dissection, vasculitis or significant stenosis. SMA: Patent without evidence of aneurysm, dissection, vasculitis or significant stenosis. Renals: Both renal  arteries are patent without evidence of aneurysm, dissection, vasculitis, fibromuscular dysplasia or significant stenosis. IMA: Patent without evidence of aneurysm, dissection, vasculitis or significant stenosis. Inflow: Patent without evidence of aneurysm, dissection, vasculitis or significant stenosis. Proximal Outflow: Bilateral common femoral and visualized portions of the superficial and profunda femoral arteries are patent without evidence of aneurysm, dissection, vasculitis or significant stenosis. Veins: No obvious venous abnormality within the limitations of this arterial phase study. Review of the MIP images confirms the above findings. NON-VASCULAR Lower chest: Elevation of the right  hemidiaphragm. Atherosclerotic calcifications in the descending thoracic aorta as well as the left main, left anterior descending, left circumflex and right coronary arteries. Dilatation of the pulmonic trunk (3.7 cm in diameter), concerning for pulmonary arterial hypertension. Subsegmental atelectasis throughout the right lung base. Hepatobiliary: No suspicious cystic or solid hepatic lesions. No intra or extrahepatic biliary ductal dilatation. Status post cholecystectomy. Pancreas: No pancreatic mass. No pancreatic ductal dilatation. No pancreatic or peripancreatic fluid collections or inflammatory changes. Spleen: Unremarkable. Adrenals/Urinary Tract: Subcentimeter low-attenuation lesion in the upper pole of the left kidney, too small to characterize, but statistically likely to represent a tiny cyst. Right kidney and bilateral adrenal glands are normal in appearance. No hydroureteronephrosis. Urinary bladder is normal in appearance. Stomach/Bowel: The appearance of the stomach is normal. There is no pathologic dilatation of small bowel or colon. Numerous colonic diverticulae are noted. There are inflammatory changes adjacent to the descending colon, which could suggest an acute diverticulitis in this region. However,  there is also relatively diffuse colorectal mural thickening which extends to the level of the proximal transverse colon where there is also associated mucosal hyperenhancement and mild hypervascularity in the mesocolon, indicative of colitis. The appendix is not confidently identified and may be surgically absent. Regardless, there are no inflammatory changes noted adjacent to the cecum to suggest the presence of an acute appendicitis at this time. Lymphatic: No lymphadenopathy noted in the abdomen or pelvis. Reproductive: Status post hysterectomy. Ovaries are not confidently identified may be surgically absent or atrophic. Other: No significant volume of ascites.  No pneumoperitoneum. Musculoskeletal: Orthopedic fixation hardware in place posterior to L3-L5. There are no aggressive appearing lytic or blastic lesions noted in the visualized portions of the skeleton. IMPRESSION: VASCULAR 1. No acute vascular abnormality in the abdomen or pelvis. NON-VASCULAR 1. Diffuse mural thickening, mucosal hyperenhancement and hypervascularity of the associated mesorectum/mesocolon indicative of proctocolitis. This extends to the level of the proximal transverse colon. Clinical correlation for signs and symptoms of ulcerative colitis is recommended. 2. There is also colonic diverticulosis. Focal inflammatory changes are noted adjacent to the descending colon such that acute diverticulitis can not be entirely excluded, however, these findings are relatively mild and could simply be attributable to underlying colitis. 3. Dilatation of the pulmonic trunk (3.7 cm in diameter), concerning for pulmonary arterial hypertension. 4. Aortic atherosclerosis, in addition to left main and three-vessel coronary artery disease. Assessment for potential risk factor modification, dietary therapy or pharmacologic therapy may be warranted, if clinically indicated. 5. Additional incidental findings, as above. Electronically Signed   By: Vinnie Langton M.D.   On: 11/18/2021 08:24    EKG: Independently reviewed.  NSR with rate 72; nonspecific ST changes with no evidence of acute ischemia   Labs on Admission: I have personally reviewed the available labs and imaging studies at the time of the admission.  Pertinent labs:    Glucose 123 BUN 19/Creatinine 1.39/GFR 40; normal baseline renal function Albumin 3.0 HS troponin 6, 8 BNP 52.5 Lactate 2.5, 2.0 WBC 14.6   Assessment/Plan * COVID-19- (present on admission) -Patient with presenting with refractory symptoms of weakness/fatigue, n/v/d from worsening COVID-19 infection despite initiation of treatment with Paxlovid -She is fully vaccinated/boosted and has not had prior episode of COVID infection -She does not have a usual home O2 requirement and is currently requiring 2L [place] O2 -COVID POSITIVE -Colitis seen on imaging, likely from [place] (see below) -The patient has comorbidities which may increase the risk for ARDS/MODS including: age, HTN, obesity, asthma,  CAD -COVID labs are pending -CXR unremarkable despite hypoxia on presentation to 75% on room air; may be developing PNA   -Will admit for further evaluation, close monitoring, and treatment -Monitor on telemetry x at least 24 hours -At this time, will attempt to avoid use of aerosolized medications and use HFAs instead -Will check daily labs including BMP with Mag, Phos; LFTs; CBC with differential; CRP; ferritin; fibrinogen; D-dimer -Will order steroids and Remdesivir (pharmacy consult) given +COVID test, +CXR, and hypoxia <94% on room air; she has already taken Paxlovid without improvement -If the patient shows clinical deterioration, consider transfer to ICU with PCCM consultation -Consider IL-6 agonist (Actemra) and/or JAK inhibitor (baricitinib) if the patient does not stabilize on current treatment or if the patient has marked clinical decompensation; the patient does not appear to require this treatment at this time   -Will attempt to maintain euvolemia to a net negative fluid status; currently dry so will give 1L IVF and then hold further IVF and monitor clinically -Will ask the patient to maintain an awake prone position as much as possible -PT/OT consults -Encourage mobilization/ambulation as much as possible -Patient was seen wearing full PPE including: gown, gloves, head cover, N95, and face shield; donning and doffing was in compliance with current standards.  AKI (acute kidney injury) ([place])- (present on admission) -Likely related to n/v/d and decreased PO intake in the setting of COVID-19 infection, colitis -Will hydrate with 100 cc/hr IVF x 10 hours and then hold -Recheck BMP in AM -Hold ACE/ARB and avoid nephrotoxic agents if possible -Minimally prolonged QTc is likely related to dehydration and is anticipated to resolve so antiemetics were ordered  Colitis with rectal bleeding- (present on admission) -Patient with n/v/d -Has hematochezia today -Proctocolitis appreciated on imaging -Suspect COVID-associated colitis -Will treat with Rocephin/Flagyl for now in case there is a bacterial component -Anticipate improvement with treatment of COVID so will not consult GI currently  CAD (coronary artery disease)- (present on admission) -Incidentally seen on imaging -Continue ASA and high-dose statin -Outpatient cardiology f/u recommended  OSA (obstructive sleep apnea)- (present on admission) -Will order CPAP  Hypothyroidism (acquired)- (present on admission) -Continue Synthroid  Dyslipidemia- (present on admission) -Continue high-dose Lipitor  Hypertension- (present on admission) -Resume verapamil tomorrow -Hold olmesartan given AKI  Bipolar disorder ([place])- (present on admission) -Continue home meds - Wellbutrin, Lexapro, Ativan    Advance Care Planning:   Code Status: Full Code   Consults: PT/OT; TOC team  Family Communication: Husband was present throughout  evaluation  Severity of Illness: The appropriate patient status for this patient is INPATIENT. Inpatient status is judged to be reasonable and necessary in order to provide the required intensity of service to ensure the patient's safety. The patient's presenting symptoms, physical exam findings, and initial radiographic and laboratory data in the context of their chronic comorbidities is felt to place them at high risk for further clinical deterioration. Furthermore, it is not anticipated that the patient will be medically stable for discharge from the hospital within 2 midnights of admission.   * I certify that at the point of admission it is my clinical judgment that the patient will require inpatient hospital care spanning beyond 2 midnights from the point of admission due to high intensity of service, high risk for further deterioration and high frequency of surveillance required.*  Author: Karmen Bongo, MD 11/18/2021 9:50 AM  For on call review [email or web address].

## 2021-11-18 NOTE — Assessment & Plan Note (Addendum)
Hemodynamically mediated-resolved with supportive care.

## 2021-11-18 NOTE — ED Notes (Signed)
Hospitalist at bedside 

## 2021-11-18 NOTE — ED Triage Notes (Signed)
Pt presents to the ED from [facility] with [facility] with complaints of N/V/D, abd pain. Pt states she was dx with COVID yesterday morning and has had increased weakness since then. Pt has fallen 2-3 times in last 24 hours. Also Spo2 75% on RA, placed on 2L of O2 via [place] and improved.

## 2021-11-18 NOTE — ED Notes (Signed)
Patient transported to CT 

## 2021-11-18 NOTE — ED Notes (Signed)
Pt had diarrhea stool that was bright red in color. Pt brief changed, pt cleansed with soap and water. MD notified of this and also of lactic 2.5. No acute changes noted. IVF infusing without difficulty. Will continue to monitor.

## 2021-11-18 NOTE — ED Notes (Signed)
Family updated as to patient's status. Family at bedside.  

## 2021-11-18 NOTE — Assessment & Plan Note (Addendum)
Continue high-dose Lipitor 

## 2021-11-18 NOTE — Assessment & Plan Note (Addendum)
Continue with CPAP

## 2021-11-18 NOTE — Assessment & Plan Note (Addendum)
Unclear whether colitis is from COVID infection or bacterial infection/inflammation or ischemic colitis.  Appreciate GI input with recommendations to continue supportive care and empiric antibiotic therapy x10 days total.  Thankfully her diarrhea has completely resolved-has completed a course of IV Remdesivir-and as noted above will be discharged on antibiotics to complete a 10-day course.

## 2021-11-18 NOTE — ED Notes (Signed)
2 golds, 1 blue, and 1 lavender sent to lab for possible admission lab orders.

## 2021-11-18 NOTE — Assessment & Plan Note (Addendum)
BP stable-continue verapamil and Benicar.

## 2021-11-18 NOTE — ED Provider Notes (Signed)
Emergency Department Provider Note  I have reviewed the triage vital signs and the nursing notes.  HISTORY  Chief Complaint Abdominal Pain, Emesis, Nausea, and Weakness   HPI Courtney [name] is a 76 y.o. female with  who presents to the emergency department today with abdominal pain, vomiting, diarrhea.  Diagnosed with COVID a couple days ago.  Was seen here yesterday afternoon for similar symptoms with normal vital signs and labs and discharged.  Patient states around 10:00 last night she started having vomiting and diarrhea.  Did not note any blood in either 1.  I did not improving with the Zofran that she had at home and the Zofran made her feel even worse so she presents here for further evaluation.  EMS brought her in and states her oxygen saturation was 75% on room air prior to applying nasal cannula.  Patient describes a diffuse colicky abdominal pain.  She does not think it normal pain from throwing up.  No fevers today.  No lightheadedness.  No syncope  PMH Past Medical History:  Diagnosis Date   Anemia    Arthritis    Bilateral Wrists   Asthma    Bipolar disorder (HCC)    Diverticulitis    Hiatal hernia    History of kidney stones 2006   Hypertension    Hypothyroidism (acquired) 04/02/2021   Kidney stone    Mitral valve prolapse    OSA (obstructive sleep apnea) 04/02/2021   Panic attack    PONV (postoperative nausea and vomiting)    Pulmonary embolus (HCC)    Sleep apnea    Stroke (HCC)    Vertigo     Home Medications Prior to Admission medications   Medication Sig Start Date End Date Taking? Authorizing Provider  albuterol (VENTOLIN HFA) 108 (90 Base) MCG/ACT inhaler Inhale 1 puff into the lungs every 6 (six) hours as needed. 02/22/21   [provider]  aspirin 325 MG tablet Take 325 mg by mouth daily.    [provider]  atorvastatin (LIPITOR) 80 MG tablet Take 80 mg by mouth daily. 02/21/21   [provider]  Biotin 5000 MCG CAPS Take  5,000 mcg by mouth daily.    [provider]  buPROPion (WELLBUTRIN XL) 150 MG 24 hr tablet Take 150 mg by mouth daily. 05/27/16   [provider]  Cholecalciferol (VITAMIN D3) 25 MCG (1000 UT) CAPS Take 1,000 Units by mouth daily.    [provider]  cyanocobalamin (,VITAMIN B-12,) 1000 MCG/ML injection Inject 1,000 mcg into the muscle every 30 (thirty) days. 02/23/21   [provider]  cyclobenzaprine (FLEXERIL) 10 MG tablet Take 1 tablet (10 mg total) by mouth 3 (three) times daily as needed for muscle spasms. 04/04/21   Val Eagle D, NP  diclofenac Sodium (VOLTAREN) 1 % GEL Apply 2 g topically daily. 12/02/20   [provider]  docusate sodium (COLACE) 100 MG capsule Take 200 mg by mouth daily.    [provider]  escitalopram (LEXAPRO) 10 MG tablet Take 10 mg by mouth daily. 11/24/20   [provider]  FLOVENT HFA 44 MCG/ACT inhaler Inhale 2 puffs into the lungs in the morning and at bedtime. 02/22/21   [provider]  gabapentin (NEURONTIN) 100 MG capsule Take 100 mg by mouth in the morning, at noon, and at bedtime. 02/13/21   [provider]  HYDROcodone-acetaminophen (NORCO/VICODIN) 5-325 MG tablet Take 1-2 tablets by mouth every 4 (four) hours as needed for  moderate pain. 04/04/21   Val EagleBergman, Meghan D, NP  levothyroxine (SYNTHROID) 50 MCG tablet Take 50 mcg by mouth daily before breakfast. 02/09/21   [provider]  magnesium oxide (MAG-OX) 400 MG tablet Take 400 mg by mouth daily.    [provider]  meclizine (ANTIVERT) 25 MG tablet Take 25 mg by mouth 3 (three) times daily as needed for dizziness.    [provider]  melatonin 5 MG TABS Take 5 mg by mouth at bedtime.    [provider]  montelukast (SINGULAIR) 10 MG tablet Take 10 mg by mouth daily. 02/13/21   [provider]  multivitamin-lutein (OCUVITE-LUTEIN) CAPS capsule Take 1 capsule by mouth daily.    [provider]  nirmatrelvir/ritonavir EUA (PAXLOVID) 20 x 150 MG & 10 x 100MG  TABS Take 3 tablets by mouth 2 (two) times daily for 5 days. Patient GFR is greater than 60. Take nirmatrelvir (150 mg) two tablets twice daily for 5 days and ritonavir (100 mg) one tablet twice daily for 5 days. 11/16/21 11/21/21  Milagros Lollykstra, Richard S, MD  olmesartan (BENICAR) 40 MG tablet Take 40 mg by mouth daily. 02/21/21   [provider]  OZEMPIC, 0.25 OR 0.5 MG/DOSE, 2 MG/1.5ML SOPN Inject 0.25 mg into the skin every Tuesday. 02/27/21   [provider]  pantoprazole (PROTONIX) 40 MG tablet Take 40 mg by mouth daily. 02/13/21   [provider]  psyllium (REGULOID) 0.52 g capsule Take 1.04 g by mouth daily.    [provider]  solifenacin (VESICARE) 10 MG tablet Take 10 mg by mouth daily. 02/21/21   [provider]  verapamil (CALAN-SR) 120 MG CR tablet Take 360 mg by mouth daily. 02/13/21   [provider]    Social History Social History   Tobacco Use   Smoking status: Never   Smokeless tobacco: Never  Vaping Use   Vaping Use: Never used  Substance Use Topics   Alcohol use: Not Currently   Drug use: Not Currently    Review of Systems: Documented in HPI ____________________________________________  PHYSICAL EXAM: VITAL SIGNS: ED Triage Vitals  Enc Vitals Group     BP 11/18/21 0542 (!) 118/54     Pulse Rate 11/18/21 0542 72     Resp 11/18/21 0542 16     Temp 11/18/21 0542 99 F (37.2 C)     Temp Source 11/18/21 0542 Oral     SpO2 11/18/21 0539 (S) (!) 75 %     Weight 11/18/21 0545 205 lb (93 kg)     Height 11/18/21 0545 5' 2.5" (1.588 m)   Physical Exam Vitals and nursing note reviewed.  Constitutional:      Appearance: She is well-developed.  HENT:     Head: Normocephalic and atraumatic.     Mouth/Throat:     Mouth: Mucous membranes are moist.     Pharynx: Oropharynx is clear.  Eyes:     Pupils: Pupils are equal, round, and reactive to light.   Cardiovascular:     Rate and Rhythm: Normal rate and regular rhythm.     Heart sounds: Murmur heard.  Pulmonary:     Effort: No respiratory distress.     Breath sounds: No stridor. Rales (Bilateral lower lobes) present.  Abdominal:     General: There is no distension.     Tenderness: There is generalized abdominal tenderness.  Musculoskeletal:        General: No swelling or deformity.  Cervical back: Normal range of motion.     Right lower leg: No edema.     Left lower leg: No edema.  Skin:    General: Skin is warm and dry.  Neurological:     Mental Status: She is alert.      ____________________________________________   LABS (all labs ordered are listed, but only abnormal results are displayed)  Labs Reviewed  CBC WITH DIFFERENTIAL/PLATELET - Abnormal; Notable for the following components:      Result Value   WBC 14.6 (*)    Neutro Abs 12.6 (*)    Monocytes Absolute 1.2 (*)    Abs Immature Granulocytes 0.09 (*)    All other components within normal limits  LACTIC ACID, PLASMA - Abnormal; Notable for the following components:   Lactic Acid, Venous 2.5 (*)    All other components within normal limits  BRAIN NATRIURETIC PEPTIDE  URINALYSIS, ROUTINE W REFLEX MICROSCOPIC  LACTIC ACID, PLASMA  COMPREHENSIVE METABOLIC PANEL  LIPASE, BLOOD  TROPONIN I (HIGH SENSITIVITY)   ____________________________________________  EKG   EKG Interpretation  Date/Time:  Sunday November 18 2021 05:55:05 EST Ventricular Rate:  72 PR Interval:  174 QRS Duration: 104 QT Interval:  458 QTC Calculation: 502 R Axis:   56 Text Interpretation: Sinus rhythm Low voltage, precordial leads Borderline T abnormalities, anterior leads Prolonged QT interval Confirmed by Marily Memos [phone]) on 11/18/2021 6:47:40 AM        ____________________________________________  RADIOLOGY  DG Chest Portable 1 View  Result Date: 11/18/2021 CLINICAL DATA:  DVA. EXAM: PORTABLE CHEST 1 VIEW  COMPARISON:  Two days ago FINDINGS: Cardiomegaly and aortic tortuosity. Elevated right diaphragm. Interstitial crowding at the bases. There is no edema, air bronchogram, effusion, or pneumothorax. IMPRESSION: Stable low volume chest with right diaphragm elevation. Electronically Signed   By: Tiburcio Pea M.D.   On: 11/18/2021 06:17   ____________________________________________  PROCEDURES  Procedure(s) performed:   Procedures ____________________________________________  INITIAL IMPRESSION / ASSESSMENT AND PLAN   This patient presents to the ED for concern of abdominal pain, vomiting, diarrhea and shortness of breath, this involves an extensive number of treatment options, and is a complaint that carries with it a high risk of complications and morbidity.  The differential diagnosis includes normal COVID sequelae, pulmonary edema, bacterial pneumonia superimposed on COVID, some other GI illness. We will plan for basic labs, chest x-ray, fluids and antiemetics.  Additional history obtained:  Additional history obtained from EMS Previous records obtained and reviewed in epic  Co morbidities that complicate the patient evaluation  Anemia, OSA, hypertension  Social Determinants of Health:  N/A  ED Course  Images ordered viewed and obtained by myself. Agree with Radiology interpretation. Details in ED course.  Labs ordered reviewed by myself as detailed in ED course.  Consultations obtained/considered detailed in ED course.   Clinical Course as of 11/18/21 0656  Wynelle Link Nov 18, 2021  0650 I shut off patient's supplemental O2 on initial exam. Reportedly sats dropped to 87% on room air at rest. Reapplied O2. Will likely need admitted.  Informed by nursing that patient had a large amount of brbpr. Patient states last colonscopy was about 2-3 years ago and does the FOBT yearly without abnormalities.  [JM]  0652 BP(!): 126/58 [JM]  0652 SpO2(!): 87 % [JM]  0652 SpO2Marland Kitchen)(S): 75 % [JM]   0652 WBC(!): 14.6 [JM]  0652 B Natriuretic Peptide: 52.5 Unlikely crackles from CHF [JM]  0652 Lactic Acid, Venous(!!): 2.5 Likely from dehydration related  to emesis/diarrhea [JM]  0652 DG Chest Portable 1 View Viewed and interpreted by myself without much change from previous. Radiology read reviewed.  [JM]  0653 EKG 12-Lead Baseline ecg [JM]    Clinical Course User Index [JM] Lawerance Matsuo, Barbara CowerJason, MD      Cardiac Monitoring:  The patient was maintained on a cardiac monitor.  I personally viewed and interpreted the cardiac monitored which showed an underlying rhythm of: Sinus rhythm  CRITICAL INTERVENTIONS:  N/a  Reevaluation:  After the interventions noted above, I reevaluated the patient and found that they have :stayed the same  FINAL IMPRESSION AND PLAN Final diagnoses:  None    Care transferred pending ct scan and rest of labs.   ____________________________________________   NEW OUTPATIENT MEDICATIONS STARTED DURING THIS VISIT:  New Prescriptions   No medications on file    Note:  This note was prepared with assistance of Dragon voice recognition software. Occasional wrong-word or sound-a-like substitutions may have occurred due to the inherent limitations of voice recognition software.    Moncerrath Berhe, Barbara CowerJason, MD 11/18/21 [id number]

## 2021-11-18 NOTE — Assessment & Plan Note (Addendum)
Seen incidentally on CT angio abdomen-no anginal symptoms.  Continue aspirin/statin-we will need cardiology evaluation in the outpatient setting when she has recovered from acute illness.

## 2021-11-18 NOTE — Assessment & Plan Note (Addendum)
Continue home meds - Wellbutrin, Lexapro, Ativan

## 2021-11-19 DIAGNOSIS — U071 COVID-19: Principal | ICD-10-CM

## 2021-11-19 DIAGNOSIS — K625 Hemorrhage of anus and rectum: Secondary | ICD-10-CM

## 2021-11-19 DIAGNOSIS — I2510 Atherosclerotic heart disease of native coronary artery without angina pectoris: Secondary | ICD-10-CM

## 2021-11-19 DIAGNOSIS — K529 Noninfective gastroenteritis and colitis, unspecified: Secondary | ICD-10-CM

## 2021-11-19 DIAGNOSIS — I2583 Coronary atherosclerosis due to lipid rich plaque: Secondary | ICD-10-CM

## 2021-11-19 DIAGNOSIS — N179 Acute kidney failure, unspecified: Secondary | ICD-10-CM

## 2021-11-19 LAB — COMPREHENSIVE METABOLIC PANEL
ALT: 16 U/L (ref 0–44)
AST: 22 U/L (ref 15–41)
Albumin: 2.7 g/dL — ABNORMAL LOW (ref 3.5–5.0)
Alkaline Phosphatase: 65 U/L (ref 38–126)
Anion gap: 7 (ref 5–15)
BUN: 11 mg/dL (ref 8–23)
CO2: 28 mmol/L (ref 22–32)
Calcium: 8.7 mg/dL — ABNORMAL LOW (ref 8.9–10.3)
Chloride: 102 mmol/L (ref 98–111)
Creatinine, Ser: 0.67 mg/dL (ref 0.44–1.00)
GFR, Estimated: >60 mL/min (ref >60)
Glucose, Bld: 128 mg/dL — ABNORMAL HIGH (ref 70–99)
Potassium: 3.7 mmol/L (ref 3.5–5.1)
Sodium: 137 mmol/L (ref 135–145)
Total Bilirubin: 0.5 mg/dL (ref 0.3–1.2)
Total Protein: 5.6 g/dL — ABNORMAL LOW (ref 6.5–8.1)

## 2021-11-19 LAB — CBC WITH DIFFERENTIAL/PLATELET
Abs Immature Granulocytes: 0.04 10*3/uL (ref 0.00–0.07)
Basophils Absolute: 0 10*3/uL (ref 0.0–0.1)
Basophils Relative: 0 %
Eosinophils Absolute: 0 10*3/uL (ref 0.0–0.5)
Eosinophils Relative: 0 %
HCT: 38.4 % (ref 36.0–46.0)
Hemoglobin: 12.5 g/dL (ref 12.0–15.0)
Immature Granulocytes: 0 %
Lymphocytes Relative: 8 %
Lymphs Abs: 0.8 10*3/uL (ref 0.7–4.0)
MCH: 29.5 pg (ref 26.0–34.0)
MCHC: 32.6 g/dL (ref 30.0–36.0)
MCV: 90.6 fL (ref 80.0–100.0)
Monocytes Absolute: 0.3 10*3/uL (ref 0.1–1.0)
Monocytes Relative: 3 %
Neutro Abs: 9.2 10*3/uL — ABNORMAL HIGH (ref 1.7–7.7)
Neutrophils Relative %: 89 %
Platelets: 150 10*3/uL (ref 150–400)
RBC: 4.24 MIL/uL (ref 3.87–5.11)
RDW: 13 % (ref 11.5–15.5)
WBC: 10.3 10*3/uL (ref 4.0–10.5)
nRBC: 0 % (ref 0.0–0.2)

## 2021-11-19 LAB — C-REACTIVE PROTEIN: CRP: 12.7 mg/dL — ABNORMAL HIGH (ref <1.0)

## 2021-11-19 LAB — FERRITIN: Ferritin: 138 ng/mL (ref 11–307)

## 2021-11-19 LAB — MAGNESIUM: Magnesium: 1.7 mg/dL (ref 1.7–2.4)

## 2021-11-19 LAB — D-DIMER, QUANTITATIVE: D-Dimer, Quant: 1.75 ug/mL-FEU — ABNORMAL HIGH (ref 0.00–0.50)

## 2021-11-19 LAB — PHOSPHORUS: Phosphorus: 3.1 mg/dL (ref 2.5–4.6)

## 2021-11-19 NOTE — Progress Notes (Signed)
PT Cancellation Note  Patient Details Name: Courtney [name] MRN: [id number] DOB: 04/26/1946   Cancelled Treatment:    Reason Eval/Treat Not Completed: Patient at procedure or test/unavailable  Patient eating breakfast.   Jerolyn Center, PT Acute Rehabilitation Services  Pager [phone] Office [phone]    Zena Amos 11/19/2021, 9:19 AM

## 2021-11-19 NOTE — Hospital Course (Signed)
:   Courtney [name] is a 76 y.o. female with medical history significant of bipolar d/o; HTN; hypothyroidism; class 2 obesity; OSA; PE; and CVA presenting with SOB.  She tested positive for COVID on 1/27.  She was seen in the ER for this on 1/27 and was treated with Paxlovid and discharged.  She returned today with worsening symptoms, O2 75% on RA.  She reports that she initially started with nausea, R ear pain, headache behind her L eye, cough.  She came to the ER and was prescribed Paxlovid but was only able to tolerate 2 doses of the medication due to persistent nausea.  She developed vomiting and diarrhea yesterday.  She has been too weak to stand or move much and has fallen twice.  She has not felt particularly SOB as much as she has felt weak.       ER Course:  COVID, now with colitis - n/v/d.  Still in the ER grossly bloody, Hgb stable.  Started on abx, on day 1 of Paxlovid.  Will stop Paxlovid and start steroids/remdesivir.

## 2021-11-19 NOTE — Evaluation (Signed)
Physical Therapy Evaluation Patient Details Name: Courtney [name] MRN: [id number] DOB: March 28, 1946 Today's Date: 11/19/2021  History of Present Illness  76 y.o. female presenting 11/18/21 with SOB.  She tested positive for COVID on 1/27 and started Paxlovid. Developed N/V/D; colitis;    PMH significant for bipolar disorder, HTN, mitral valve prolapse, claustrophobia, CVA, vertigo, PE, prior back surgery, B knee replacements.  Clinical Impression   Pt admitted secondary to problem above with deficits below. PTA patient was living with husband in [place] and walking with rollator to/from dining room.  Pt currently requires min-guard assist for standing and transfers with ambulation limited by leaking rectal pouch (nursing aware and in to assist patient at end of session). Anticipate patient will benefit from PT to address problems listed below.Will continue to follow acutely to maximize functional mobility independence and safety.          Recommendations for follow up therapy are one component of a multi-disciplinary discharge planning process, led by the attending physician.  Recommendations may be updated based on patient status, additional functional criteria and insurance authorization.  Follow Up Recommendations Home health PT at [facility] Recommended at Discharge Frequent or constant Supervision/Assistance  Patient can return home with the following  A little help with walking and/or transfers;Assistance with cooking/housework;Assist for transportation    Equipment Recommendations None recommended by PT  Recommendations for Other Services  OT consult    Functional Status Assessment Patient has had a recent decline in their functional status and demonstrates the ability to make significant improvements in function in a reasonable and predictable amount of time.     Precautions / Restrictions Precautions Precautions: Fall      Mobility  Bed Mobility                General bed mobility comments: up in recliner    Transfers Overall transfer level: Needs assistance Equipment used: Rolling walker (2 wheels) Transfers: Sit to/from Stand, Bed to chair/wheelchair/BSC Sit to Stand: Min guard   Step pivot transfers: Min guard       General transfer comment: from recliner to [facility]    Ambulation/Gait               General Gait Details: not tested due to leaking rectal pouch; nursing in while pt on BSC to address rectal pouch and bathe patient  Stairs            Wheelchair Mobility    Modified Rankin (Stroke Patients Only)       Balance Overall balance assessment: Needs assistance Sitting-balance support: No upper extremity supported, Feet supported Sitting balance-Leahy Scale: Good     Standing balance support: Bilateral upper extremity supported, Reliant on assistive device for balance Standing balance-Leahy Scale: Poor                               Pertinent Vitals/Pain Pain Assessment Pain Assessment: No/denies pain    Home Living Family/patient expects to be discharged to:: Private residence Living Arrangements: Spouse/significant other Available Help at Discharge: Family;Available 24 hours/day Type of Home: Independent living facility Home Access: [place]: One level Home Equipment: Rollator (4 wheels);Grab bars - toilet;Grab bars - tub/shower;Shower seat - built in;Standard Walker;Cane - quad;Cane - single point      Prior Function Prior Level of Function : Needs assist       Physical Assist :  ADLs (physical);Mobility (physical) Mobility (physical): Bed mobility ADLs (physical): Dressing;Feeding Mobility Comments: using rollator modified independent walks as far as dining room; husband helps lift legs up onto bed ADLs Comments: husband helps puts pants on sometimes; helps cut food sometimes     Hand Dominance   Dominant Hand: Right    Extremity/Trunk Assessment    Upper Extremity Assessment Upper Extremity Assessment: Defer to OT evaluation    Lower Extremity Assessment Lower Extremity Assessment: Generalized weakness    Cervical / Trunk Assessment Cervical / Trunk Assessment: Other exceptions Cervical / Trunk Exceptions: overweight  Communication   Communication: No difficulties  Cognition Arousal/Alertness: Awake/alert Behavior During Therapy: WFL for tasks assessed/performed Overall Cognitive Status: Within Functional Limits for tasks assessed                                 General Comments: cognition not specifically tested--provided home set-up, name, DOB, followed commands appropriately, +problem-solving        General Comments      Exercises     Assessment/Plan    PT Assessment Patient needs continued PT services  PT Problem List Decreased strength;Decreased activity tolerance;Decreased balance;Decreased mobility;Obesity       PT Treatment Interventions DME instruction;Gait training;Functional mobility training;Therapeutic activities;Therapeutic exercise;Balance training;Patient/family education    PT Goals (Current goals can be found in the Care Plan section)  Acute Rehab PT Goals Patient Stated Goal: be strong enough to go straight home PT Goal Formulation: With patient Time For Goal Achievement: 12/03/21 Potential to Achieve Goals: Good    Frequency Min 3X/week     Co-evaluation               AM-PAC PT "6 Clicks" Mobility  Outcome Measure Help needed turning from your back to your side while in a flat bed without using bedrails?: A Little Help needed moving from lying on your back to sitting on the side of a flat bed without using bedrails?: A Little Help needed moving to and from a bed to a chair (including a wheelchair)?: A Little Help needed standing up from a chair using your arms (e.g., wheelchair or bedside chair)?: A Little Help needed to walk in hospital room?: A Little Help needed  climbing 3-5 steps with a railing? : Total 6 Click Score: 16    End of Session   Activity Tolerance: Patient tolerated treatment well Patient left: with nursing/sitter in room;Other (comment) (on [facility]) Nurse Communication: Mobility status;Other (comment) (rectal pouch leaking) PT Visit Diagnosis: Unsteadiness on feet (R26.81);Muscle weakness (generalized) (M62.81)    Time: 1038-1100 PT Time Calculation (min) (ACUTE ONLY): 22 min   Charges:   PT Evaluation $PT Eval Moderate Complexity: [place], PT Acute Rehabilitation Services  Pager [phone] Office [phone]   Rexanne Mano 11/19/2021, 11:17 AM

## 2021-11-19 NOTE — Evaluation (Signed)
Occupational Therapy Evaluation Patient Details Name: Courtney [name] MRN: [id number] DOB: December 12, 1945 Today's Date: 11/19/2021   History of Present Illness 76 y.o. female presenting 11/18/21 with SOB.  She tested positive for COVID on 1/27 and started Paxlovid. Developed N/V/D; colitis;    PMH significant for bipolar disorder, HTN, mitral valve prolapse, claustrophobia, CVA, vertigo, PE, prior back surgery, B knee replacements.   Clinical Impression   Pt presents with decline in function and safety with ADLs and ADL mobility with impaired strength, balance and endurance. PTA pt lived at [facility] in [place] and was mostly Ind with ADLs/selfcare (husband assisted with LB dressing prn and cooking), pt used a Marketing executive for mobility. Pt currently requires Sup to sit EOB, mni guard for standing, mod A with LB ADLs, min A with toileting and min guard A with transfers/mobility using 4WRW. Pt would benefit from acute OT services to address impairments to maximize level of function and safety     Recommendations for follow up therapy are one component of a multi-disciplinary discharge planning process, led by the attending physician.  Recommendations may be updated based on patient status, additional functional criteria and insurance authorization.   Follow Up Recommendations  Home health OT (at [facility])    Assistance Recommended at Discharge Frequent or constant Supervision/Assistance  Patient can return home with the following A little help with bathing/dressing/bathroom;A little help with walking and/or transfers;Assistance with cooking/housework;Assist for transportation    Functional Status Assessment  Patient has had a recent decline in their functional status and demonstrates the ability to make significant improvements in function in a reasonable and predictable amount of time.  Equipment Recommendations  None recommended by OT    Recommendations for Other Services       Precautions /  Restrictions Precautions Precautions: Fall      Mobility Bed Mobility Overal bed mobility: Needs Assistance Bed Mobility: Supine to Sit, Sit to Supine     Supine to sit: Supervision, HOB elevated Sit to supine: Supervision        Transfers Overall transfer level: Needs assistance Equipment used: Rolling walker (2 wheels) Transfers: Sit to/from Stand, Bed to chair/wheelchair/BSC Sit to Stand: Min guard     Step pivot transfers: Min guard     General transfer comment: walked x 3 ft from EOB to [facility] using rollater      Balance Overall balance assessment: Needs assistance Sitting-balance support: No upper extremity supported, Feet supported Sitting balance-Leahy Scale: Good     Standing balance support: Bilateral upper extremity supported, Reliant on assistive device for balance Standing balance-Leahy Scale: Poor                             ADL either performed or assessed with clinical judgement   ADL Overall ADL's : Needs assistance/impaired Eating/Feeding: Set up;Independent;Sitting   Grooming: Wash/dry hands;Wash/dry face;Standing;Min guard;Sitting   Upper Body Bathing: Min guard;Sitting   Lower Body Bathing: Moderate assistance   Upper Body Dressing : Min guard;Sitting   Lower Body Dressing: Moderate assistance   Toilet Transfer: Min guard;Ambulation;Rollator (4 wheels);BSC/3in1   Toileting- Water quality scientist and Hygiene: Minimal assistance;Sit to/from stand       Functional mobility during ADLs: Engineer, manufacturing (4 wheels)       Vision Baseline Vision/History: 1 Wears glasses Ability to See in Adequate Light: 0 Adequate Patient Visual Report: No change from baseline       Perception     Praxis  Pertinent Vitals/Pain Pain Assessment Pain Assessment: No/denies pain     Hand Dominance Right   Extremity/Trunk Assessment Upper Extremity Assessment Upper Extremity Assessment: Generalized weakness   Lower Extremity  Assessment Lower Extremity Assessment: Defer to PT evaluation       Communication Communication Communication: No difficulties   Cognition Arousal/Alertness: Awake/alert Behavior During Therapy: WFL for tasks assessed/performed Overall Cognitive Status: Within Functional Limits for tasks assessed                                       General Comments       Exercises     Shoulder Instructions      Home Living Family/patient expects to be discharged to:: Private residence Living Arrangements: Spouse/significant other Available Help at Discharge: Family;Available 24 hours/day Type of Home: Independent living facility Home Access: Elevator     Home Layout: One level     Bathroom Shower/Tub: Occupational psychologist: Handicapped height Bathroom Accessibility: Yes   Home Equipment: Rollator (4 wheels);Grab bars - toilet;Grab bars - tub/shower;Shower seat - built in;Standard Control and instrumentation engineer - single point   Additional Comments: Lives at [facility] in [place] and plans on going to SNF rehab section at d/c      Prior Functioning/Environment Prior Level of Function : Needs assist       Physical Assist : ADLs (physical);Mobility (physical) Mobility (physical): Bed mobility ADLs (physical): Dressing;Feeding Mobility Comments: using rollator modified independent walks as far as dining room; husband helps lift legs up onto bed ADLs Comments: husband helps puts socks and pants on sometimes; helps cut food sometimes        OT Problem List: Decreased strength;Impaired balance (sitting and/or standing);Decreased activity tolerance;Decreased knowledge of use of DME or AE      OT Treatment/Interventions: Self-care/ADL training;Patient/family education;Balance training;Therapeutic activities;DME and/or AE instruction    OT Goals(Current goals can be found in the care plan section) Acute Rehab OT Goals Patient Stated Goal: go home OT Goal  Formulation: With patient Time For Goal Achievement: 12/03/21 Potential to Achieve Goals: Good ADL Goals Pt Will Perform Grooming: with supervision;with set-up;standing Pt Will Perform Upper Body Bathing: with supervision;with set-up;sitting Pt Will Perform Lower Body Bathing: with min assist;with min guard assist;sitting/lateral leans;sit to/from stand Pt Will Perform Upper Body Dressing: with supervision;with set-up;sitting Pt Will Perform Lower Body Dressing: with min assist;sitting/lateral leans;sit to/from stand Pt Will Transfer to Toilet: with supervision;with modified independence;ambulating Pt Will Perform Toileting - Clothing Manipulation and hygiene: with min guard assist;with supervision;sit to/from stand  OT Frequency: Min 2X/week    Co-evaluation              AM-PAC OT "6 Clicks" Daily Activity     Outcome Measure Help from another person eating meals?: None Help from another person taking care of personal grooming?: A Little Help from another person toileting, which includes using toliet, bedpan, or urinal?: A Lot Help from another person bathing (including washing, rinsing, drying)?: A Lot Help from another person to put on and taking off regular upper body clothing?: A Little Help from another person to put on and taking off regular lower body clothing?: A Lot 6 Click Score: 16   End of Session Equipment Utilized During Treatment: Rollator (4 wheels);Other (comment) [facility]) Nurse Communication: Mobility status  Activity Tolerance: Patient tolerated treatment well Patient left: in bed;with call bell/phone within reach  OT Visit Diagnosis: Unsteadiness on feet (R26.81);Other abnormalities of gait and mobility (R26.89);Muscle weakness (generalized) (M62.81)                Time: [id number] OT Time Calculation (min): 41 min Charges:  OT General Charges $OT Visit: 1 Visit OT Evaluation $OT Eval Moderate Complexity: 1 Mod OT Treatments $Self Care/Home Management :  8-22 mins $Therapeutic Activity: 8-22 mins    Britt Bottom 11/19/2021, 4:05 PM

## 2021-11-19 NOTE — Progress Notes (Signed)
PROGRESS NOTE                                                                             PROGRESS NOTE                                                                                                                                                                                                             Patient Demographics:    Courtney [name], is a 76 y.o. female, DOB - [birth year], [id number]  Outpatient Primary MD for the patient is Wenda Low, MD    LOS - 1  Admit date - 11/18/2021    Chief Complaint  Patient presents with   Abdominal Pain   Emesis   Nausea   Weakness       Brief Narrative    Courtney [name] is a 76 y.o. female with medical history significant of bipolar d/o; HTN; hypothyroidism; class 2 obesity; OSA; PE; and CVA presenting with SOB.  She tested positive for COVID on 1/27.  She was seen in the ER for this on 1/27 and was treated with Paxlovid and discharged.  She returned [name] with worsening symptoms, O2 75% on RA.  She reports that she initially started with nausea, R ear pain, headache behind her L eye, cough.  She came to the ER and was prescribed Paxlovid but was only able to tolerate 2 doses of the medication due to persistent nausea.  She developed vomiting and diarrhea yesterday.  She has been too weak to stand or move much and has fallen twice.  She has not felt particularly SOB as much as she has felt weak. -Her work-up in ED was significant for hypoxia requiring oxygen, imaging significant for colitis, she was started on Solu-Medrol, remdesivir and IV antibiotics.     Subjective:    Courtney [name] reports no further diarrhea, but still reports abdominal pain, nausea, and poor appetite, reports dyspnea has improved, she still reports cough.       Assessment  & Plan :  COVID-19- (present on admission) -Patient with presenting with refractory symptoms of weakness/fatigue, n/v/d from worsening COVID-19  infection despite initiation of treatment with Paxlovid -She is fully vaccinated/boosted and has not had prior episode of COVID infection -She does not have a usual home O2 requirement and is currently requiring 2L [place] O2 -COVID POSITIVE -Colitis seen on imaging, likely from [place] (see below) -The patient with multiple comorbidities, at risk for complications from COVID including ARDS, including age, HTN, obesity, asthma, CAD, so she will be started on IV remdesivir, given the fact she was on Paxil with at home without much improvement of her symptoms. -Initially with hypoxia 75% on room air, so we will continue with IV steroids as well. -She was encouraged use incentive spirometry, flutter valve, out of bed to chair, and nebulizer treatment as needed.    Colitis with rectal bleeding- (present on admission) -Patient with n/v/d -Had hematochezia on admission, this is most likely due to colitis. -Proctocolitis appreciated on imaging, most likely due to COVID, but there is possibly bacterial component, so continue with IV Rocephin and azithromycin. -Suspect COVID-associated colitis -We will check for C. Difficile  AKI (acute kidney injury) (Courtney [name])- (present on admission) -Likely related to n/v/d and decreased PO intake in the setting of COVID-19 infection, colitis -Resolved with IV hydration -Hold ACE/ARB and avoid nephrotoxic agents if possible -Minimally prolonged QTc is likely related to dehydration and is anticipated to resolve so antiemetics were ordered  CAD (coronary artery disease)- (present on admission) -Incidentally seen on imaging -Continue ASA and high-dose statin -Outpatient cardiology f/u recommended  OSA (obstructive sleep apnea)- (present on admission) -Continue with CPAP  Dyslipidemia- (present on admission) -Continue high-dose Lipitor  Hypothyroidism (acquired)- (present on admission) -Continue Synthroid  Hypertension- (present on admission) -Blood pressure remains  acceptable, likely will resume verapamil tomorrow if started to increase. -Hold olmesartan given AKI  Bipolar disorder ([place])- (present on admission) -Continue home meds - Wellbutrin, Lexapro, Ativan        Family Communication  :  none at bedside  Code Status :  full  Consults  :  None  Disposition Plan  :    Status is: Inpatient  Remains inpatient appropriate because: IV ABX      DVT Prophylaxis  :  Lovenox   Lab Results  Component Value Date   PLT 150 11/19/2021    Diet :  Diet Order             Diet clear liquid Room service appropriate? No; Fluid consistency: Thin  Diet effective now                    Inpatient Medications  Scheduled Meds:  vitamin C  500 mg Oral Daily   aspirin  325 mg Oral Daily   atorvastatin  80 mg Oral Daily   buPROPion  150 mg Oral Daily   enoxaparin (LOVENOX) injection  40 mg Subcutaneous Q24H   escitalopram  10 mg Oral Daily   fluticasone  2 spray Each Nare BID   fluticasone  2 puff Inhalation BID   gabapentin  100 mg Oral TID   levothyroxine  50 mcg Oral QAC breakfast   LORazepam  1 mg Oral QHS   melatonin  5 mg Oral QHS   methylPREDNISolone (SOLU-MEDROL) injection  1 mg/kg Intravenous Q24H   Followed by   Derrill Memo ON 11/21/2021] predniSONE  50 mg Oral Daily   mirabegron ER  25 mg Oral Daily   montelukast  10  mg Oral Daily   pantoprazole  40 mg Oral Daily   sodium chloride flush  3 mL Intravenous Q12H   sodium chloride flush  3 mL Intravenous Q12H   verapamil  360 mg Oral Daily   zinc sulfate  220 mg Oral Daily   Continuous Infusions:  sodium chloride     cefTRIAXone (ROCEPHIN)  IV 2 g (11/19/21 0901)   metronidazole 500 mg (11/19/21 1013)   promethazine (PHENERGAN) injection (IM or IVPB)     remdesivir 100 mg in NS 100 mL 100 mg (11/19/21 0907)   PRN Meds:.sodium chloride, acetaminophen, albuterol, chlorpheniramine-HYDROcodone, cyclobenzaprine, guaiFENesin-dextromethorphan, LORazepam, ondansetron **OR**  ondansetron (ZOFRAN) IV, oxyCODONE, promethazine (PHENERGAN) injection (IM or IVPB), sodium chloride flush  Antibiotics  :    Anti-infectives (From admission, onward)    Start     Dose/Rate Route Frequency Ordered Stop   11/19/21 1000  remdesivir 100 mg in sodium chloride 0.9 % 100 mL IVPB       See Hyperspace for full Linked Orders Report.   100 mg 200 mL/hr over 30 Minutes Intravenous Daily 11/18/21 0932 11/23/21 0959   11/19/21 0800  cefTRIAXone (ROCEPHIN) 2 g in sodium chloride 0.9 % 100 mL IVPB        2 g 200 mL/hr over 30 Minutes Intravenous Every 24 hours 11/18/21 0932     11/18/21 0945  remdesivir 200 mg in sodium chloride 0.9% 250 mL IVPB       See Hyperspace for full Linked Orders Report.   200 mg 580 mL/hr over 30 Minutes Intravenous Once 11/18/21 0932 11/18/21 1203   11/18/21 0830  cefTRIAXone (ROCEPHIN) 2 g in sodium chloride 0.9 % 100 mL IVPB        2 g 200 mL/hr over 30 Minutes Intravenous  Once 11/18/21 0829 11/18/21 0937   11/18/21 0830  metroNIDAZOLE (FLAGYL) IVPB 500 mg        500 mg 100 mL/hr over 60 Minutes Intravenous Every 12 hours 11/18/21 0829            Phillips Climes M.D on 11/19/2021 at 1:16 PM  To page go to [email or web address]   [facility] -  Office  [phone]       Objective:   Vitals:   11/19/21 0555 11/19/21 0832 11/19/21 1005 11/19/21 1130  BP: (!) 153/91 133/87 133/87 138/81  Pulse: 94 91  86  Resp: (!) 22 15  15   Temp:  98.2 F (36.8 C)  98.2 F (36.8 C)  TempSrc:  Oral  Oral  SpO2: 98% 97%  93%  Weight:      Height:        Wt Readings from Last 3 Encounters:  11/18/21 93 kg  11/16/21 93.9 kg  03/28/21 100.4 kg     Intake/Output Summary (Last 24 hours) at 11/19/2021 1316 Last data filed at 11/18/2021 2238 Gross per 24 hour  Intake 729.71 ml  Output --  Net 729.71 ml     Physical Exam  Awake Alert, No new F.N deficits, Normal affect [place].AT,PERRAL Supple Neck,No JVD, No cervical lymphadenopathy  appriciated.  Symmetrical Chest wall movement, Good air movement bilaterally, CTAB RRR,No Gallops,Rubs or new Murmurs, No Parasternal Heave +ve B.Sounds, Abd Soft, No tenderness, No organomegaly appriciated, No rebound - guarding or rigidity. No Cyanosis, Clubbing or edema, No new Rash or bruise      Data Review:    CBC Recent Labs  Lab 11/16/21 0903 11/18/21 0555 11/19/21 0701  WBC 6.3 14.6* 10.3  HGB 12.1 13.0 12.5  HCT 38.2 41.1 38.4  PLT 174 182 150  MCV 92.3 92.8 90.6  MCH 29.2 29.3 29.5  MCHC 31.7 31.6 32.6  RDW 13.2 13.5 13.0  LYMPHSABS 0.9 0.7 0.8  MONOABS 1.4* 1.2* 0.3  EOSABS 0.1 0.0 0.0  BASOSABS 0.1 0.0 0.0    Recent Labs  Lab 11/16/21 0903 11/18/21 0550 11/18/21 0555 11/18/21 0619 11/18/21 0750 11/18/21 0852 11/19/21 0701  NA 137  --   --  134*  --   --  137  K 3.8  --   --  3.8  --   --  3.7  CL 103  --   --  100  --   --  102  CO2 27  --   --  27  --   --  28  GLUCOSE 91  --   --  123*  --   --  128*  BUN 12  --   --  19  --   --  11  CREATININE 0.80  --   --  1.39*  --   --  0.67  CALCIUM 9.0  --   --  8.0*  --   --  8.7*  AST 17  --   --  23  --   --  22  ALT 15  --   --  <5  --   --  16  ALKPHOS 73  --   --  75  --   --  65  BILITOT 0.4  --   --  0.4  --   --  0.5  ALBUMIN 3.3*  --   --  3.0*  --   --  2.7*  MG  --   --   --   --   --   --  1.7  CRP  --   --   --   --   --  7.3* 12.7*  DDIMER  --   --   --   --   --  1.53* 1.75*  PROCALCITON  --   --   --   --   --  5.26  --   LATICACIDVEN  --  2.5*  --   --  2.0*  --   --   BNP  --   --  52.5  --   --   --   --     ------------------------------------------------------------------------------------------------------------------ No results for input(s): CHOL, HDL, LDLCALC, TRIG, CHOLHDL, LDLDIRECT in the last 72 hours.  No results found for: HGBA1C ------------------------------------------------------------------------------------------------------------------ No results for  input(s): TSH, T4TOTAL, T3FREE, THYROIDAB in the last 72 hours.  Invalid input(s): FREET3  Cardiac Enzymes No results for input(s): CKMB, TROPONINI, MYOGLOBIN in the last 168 hours.  Invalid input(s): CK ------------------------------------------------------------------------------------------------------------------    Component Value Date/Time   BNP 52.5 11/18/2021 0555    Micro Results No results found for this or any previous visit (from the past 240 hour(s)).  Radiology Reports DG Chest Portable 1 View  Result Date: 11/18/2021 CLINICAL DATA:  DVA. EXAM: PORTABLE CHEST 1 VIEW COMPARISON:  Two days ago FINDINGS: Cardiomegaly and aortic tortuosity. Elevated right diaphragm. Interstitial crowding at the bases. There is no edema, air bronchogram, effusion, or pneumothorax. IMPRESSION: Stable low volume chest with right diaphragm elevation. Electronically Signed   By: Jorje Guild M.D.   On: 11/18/2021 06:17   DG Chest Portable 1 View  Result Date: 11/16/2021 CLINICAL DATA:  Cough. EXAM: PORTABLE CHEST  1 VIEW COMPARISON:  04/01/2021 FINDINGS: Chronic elevation of the right hemidiaphragm. Both lungs are clear. Surgical plate in the lower cervical spine. There appears to be surgical hardware in the lower lumbar spine but incompletely evaluated. Heart size is normal. IMPRESSION: 1. No acute cardiopulmonary disease. 2. Chronic elevation of the right hemidiaphragm. Electronically Signed   By: Markus Daft M.D.   On: 11/16/2021 08:45   CT Angio Abd/Pel W and/or Wo Contrast  Result Date: 11/18/2021 CLINICAL DATA:  76 year old female with history of lower GI bleed. Nausea, vomiting, diarrhea and abdominal pain. Diagnosed with COVID yesterday morning. Weakness. EXAM: CTA ABDOMEN AND PELVIS WITHOUT AND WITH CONTRAST TECHNIQUE: Multidetector CT imaging of the abdomen and pelvis was performed using the standard protocol during bolus administration of intravenous contrast. Multiplanar reconstructed  images and MIPs were obtained and reviewed to evaluate the vascular anatomy. RADIATION DOSE REDUCTION: This exam was performed according to the departmental dose-optimization program which includes automated exposure control, adjustment of the mA and/or kV according to patient size and/or use of iterative reconstruction technique. CONTRAST:  59mL OMNIPAQUE IOHEXOL 350 MG/ML SOLN COMPARISON:  No priors. FINDINGS: VASCULAR Aorta: Aortic atherosclerosis. Normal caliber aorta without aneurysm, dissection, vasculitis or significant stenosis. Celiac: Patent without evidence of aneurysm, dissection, vasculitis or significant stenosis. SMA: Patent without evidence of aneurysm, dissection, vasculitis or significant stenosis. Renals: Both renal arteries are patent without evidence of aneurysm, dissection, vasculitis, fibromuscular dysplasia or significant stenosis. IMA: Patent without evidence of aneurysm, dissection, vasculitis or significant stenosis. Inflow: Patent without evidence of aneurysm, dissection, vasculitis or significant stenosis. Proximal Outflow: Bilateral common femoral and visualized portions of the superficial and profunda femoral arteries are patent without evidence of aneurysm, dissection, vasculitis or significant stenosis. Veins: No obvious venous abnormality within the limitations of this arterial phase study. Review of the MIP images confirms the above findings. NON-VASCULAR Lower chest: Elevation of the right hemidiaphragm. Atherosclerotic calcifications in the descending thoracic aorta as well as the left main, left anterior descending, left circumflex and right coronary arteries. Dilatation of the pulmonic trunk (3.7 cm in diameter), concerning for pulmonary arterial hypertension. Subsegmental atelectasis throughout the right lung base. Hepatobiliary: No suspicious cystic or solid hepatic lesions. No intra or extrahepatic biliary ductal dilatation. Status post cholecystectomy. Pancreas: No  pancreatic mass. No pancreatic ductal dilatation. No pancreatic or peripancreatic fluid collections or inflammatory changes. Spleen: Unremarkable. Adrenals/Urinary Tract: Subcentimeter low-attenuation lesion in the upper pole of the left kidney, too small to characterize, but statistically likely to represent a tiny cyst. Right kidney and bilateral adrenal glands are normal in appearance. No hydroureteronephrosis. Urinary bladder is normal in appearance. Stomach/Bowel: The appearance of the stomach is normal. There is no pathologic dilatation of small bowel or colon. Numerous colonic diverticulae are noted. There are inflammatory changes adjacent to the descending colon, which could suggest an acute diverticulitis in this region. However, there is also relatively diffuse colorectal mural thickening which extends to the level of the proximal transverse colon where there is also associated mucosal hyperenhancement and mild hypervascularity in the mesocolon, indicative of colitis. The appendix is not confidently identified and may be surgically absent. Regardless, there are no inflammatory changes noted adjacent to the cecum to suggest the presence of an acute appendicitis at this time. Lymphatic: No lymphadenopathy noted in the abdomen or pelvis. Reproductive: Status post hysterectomy. Ovaries are not confidently identified may be surgically absent or atrophic. Other: No significant volume of ascites.  No pneumoperitoneum. Musculoskeletal: Orthopedic fixation hardware in place posterior  to L3-L5. There are no aggressive appearing lytic or blastic lesions noted in the visualized portions of the skeleton. IMPRESSION: VASCULAR 1. No acute vascular abnormality in the abdomen or pelvis. NON-VASCULAR 1. Diffuse mural thickening, mucosal hyperenhancement and hypervascularity of the associated mesorectum/mesocolon indicative of proctocolitis. This extends to the level of the proximal transverse colon. Clinical correlation for  signs and symptoms of ulcerative colitis is recommended. 2. There is also colonic diverticulosis. Focal inflammatory changes are noted adjacent to the descending colon such that acute diverticulitis can not be entirely excluded, however, these findings are relatively mild and could simply be attributable to underlying colitis. 3. Dilatation of the pulmonic trunk (3.7 cm in diameter), concerning for pulmonary arterial hypertension. 4. Aortic atherosclerosis, in addition to left main and three-vessel coronary artery disease. Assessment for potential risk factor modification, dietary therapy or pharmacologic therapy may be warranted, if clinically indicated. 5. Additional incidental findings, as above. Electronically Signed   By: Vinnie Langton M.D.   On: 11/18/2021 08:24                                          Patient ID: AUBRE MARTS, female   DOB: 1946/10/08, 76 y.o.   MRN: [id number]

## 2021-11-20 ENCOUNTER — Inpatient Hospital Stay (HOSPITAL_COMMUNITY): Payer: Medicare HMO

## 2021-11-20 DIAGNOSIS — E876 Hypokalemia: Secondary | ICD-10-CM | POA: Diagnosis present

## 2021-11-20 LAB — D-DIMER, QUANTITATIVE: D-Dimer, Quant: 1.52 ug/mL-FEU — ABNORMAL HIGH (ref 0.00–0.50)

## 2021-11-20 LAB — CBC WITH DIFFERENTIAL/PLATELET
Abs Immature Granulocytes: 0.04 10*3/uL (ref 0.00–0.07)
Basophils Absolute: 0 10*3/uL (ref 0.0–0.1)
Basophils Relative: 0 %
Eosinophils Absolute: 0 10*3/uL (ref 0.0–0.5)
Eosinophils Relative: 0 %
HCT: 35.2 % — ABNORMAL LOW (ref 36.0–46.0)
Hemoglobin: 11.5 g/dL — ABNORMAL LOW (ref 12.0–15.0)
Immature Granulocytes: 0 %
Lymphocytes Relative: 9 %
Lymphs Abs: 0.9 10*3/uL (ref 0.7–4.0)
MCH: 29.1 pg (ref 26.0–34.0)
MCHC: 32.7 g/dL (ref 30.0–36.0)
MCV: 89.1 fL (ref 80.0–100.0)
Monocytes Absolute: 0.6 10*3/uL (ref 0.1–1.0)
Monocytes Relative: 6 %
Neutro Abs: 8.3 10*3/uL — ABNORMAL HIGH (ref 1.7–7.7)
Neutrophils Relative %: 85 %
Platelets: 149 10*3/uL — ABNORMAL LOW (ref 150–400)
RBC: 3.95 MIL/uL (ref 3.87–5.11)
RDW: 12.9 % (ref 11.5–15.5)
WBC: 9.8 10*3/uL (ref 4.0–10.5)
nRBC: 0 % (ref 0.0–0.2)

## 2021-11-20 LAB — COMPREHENSIVE METABOLIC PANEL
ALT: 16 U/L (ref 0–44)
AST: 22 U/L (ref 15–41)
Albumin: 2.7 g/dL — ABNORMAL LOW (ref 3.5–5.0)
Alkaline Phosphatase: 57 U/L (ref 38–126)
Anion gap: 9 (ref 5–15)
BUN: 13 mg/dL (ref 8–23)
CO2: 27 mmol/L (ref 22–32)
Calcium: 8.8 mg/dL — ABNORMAL LOW (ref 8.9–10.3)
Chloride: 101 mmol/L (ref 98–111)
Creatinine, Ser: 0.63 mg/dL (ref 0.44–1.00)
GFR, Estimated: >60 mL/min (ref >60)
Glucose, Bld: 132 mg/dL — ABNORMAL HIGH (ref 70–99)
Potassium: 3.3 mmol/L — ABNORMAL LOW (ref 3.5–5.1)
Sodium: 137 mmol/L (ref 135–145)
Total Bilirubin: 0.2 mg/dL — ABNORMAL LOW (ref 0.3–1.2)
Total Protein: 5.5 g/dL — ABNORMAL LOW (ref 6.5–8.1)

## 2021-11-20 LAB — RESP PANEL BY RT-PCR (FLU A&B, COVID) ARPGX2
Influenza A by PCR: NEGATIVE
Influenza B by PCR: NEGATIVE
SARS Coronavirus 2 by RT PCR: POSITIVE — AB

## 2021-11-20 LAB — C DIFFICILE QUICK SCREEN W PCR REFLEX
C Diff antigen: NEGATIVE
C Diff interpretation: NOT DETECTED
C Diff toxin: NEGATIVE

## 2021-11-20 LAB — PHOSPHORUS: Phosphorus: 2.6 mg/dL (ref 2.5–4.6)

## 2021-11-20 LAB — C-REACTIVE PROTEIN: CRP: 5 mg/dL — ABNORMAL HIGH (ref <1.0)

## 2021-11-20 LAB — FERRITIN: Ferritin: 186 ng/mL (ref 11–307)

## 2021-11-20 LAB — MAGNESIUM: Magnesium: 1.7 mg/dL (ref 1.7–2.4)

## 2021-11-20 MED ORDER — CHLORHEXIDINE GLUCONATE 0.12 % MT SOLN
15.0000 mL | Freq: Two times a day (BID) | OROMUCOSAL | Status: DC
  Administered 2021-11-20 – 2021-11-24 (×9): 15 mL via OROMUCOSAL
  Filled 2021-11-20 (×9): qty 15

## 2021-11-20 MED ORDER — POTASSIUM CHLORIDE CRYS ER 20 MEQ PO TBCR
40.0000 meq | EXTENDED_RELEASE_TABLET | Freq: Four times a day (QID) | ORAL | Status: AC
  Administered 2021-11-20 (×2): 40 meq via ORAL
  Filled 2021-11-20 (×2): qty 2

## 2021-11-20 MED ORDER — ORAL CARE MOUTH RINSE
15.0000 mL | Freq: Two times a day (BID) | OROMUCOSAL | Status: DC
  Administered 2021-11-20 – 2021-11-24 (×8): 15 mL via OROMUCOSAL

## 2021-11-20 NOTE — Progress Notes (Signed)
Physical Therapy Treatment Patient Details Name: Courtney [name] MRN: [id number] DOB: 1946-02-21 Today's Date: 11/20/2021   History of Present Illness 76 y.o. female presenting 11/18/21 with SOB.  She tested positive for COVID on 1/27 and started Paxlovid. Developed N/V/D; colitis;    PMH significant for bipolar disorder, HTN, mitral valve prolapse, claustrophobia, CVA, vertigo, PE, prior back surgery, B knee replacements.    PT Comments    Patient practiced transfers multiple times and ambulated short distances multiple times (max 20 ft). Navigates well with rollator. Overall minguard assist.   Recommendations for follow up therapy are one component of a multi-disciplinary discharge planning process, led by the attending physician.  Recommendations may be updated based on patient status, additional functional criteria and insurance authorization.  Follow Up Recommendations  Home health PT     Assistance Recommended at Discharge Intermittent Supervision/Assistance  Patient can return home with the following A little help with walking and/or transfers;Assistance with cooking/housework;Assistance with feeding   Equipment Recommendations  None recommended by PT    Recommendations for Other Services       Precautions / Restrictions Precautions Precautions: Fall     Mobility  Bed Mobility Overal bed mobility: Needs Assistance Bed Mobility: Supine to Sit     Supine to sit: HOB elevated, Min assist (lower than 1/30)          Transfers Overall transfer level: Needs assistance Equipment used: Rolling walker (2 wheels) Transfers: Sit to/from Stand Sit to Stand: Min guard           General transfer comment: stood x 4 reps throughout session; vc for safest use of rollator    Ambulation/Gait Ambulation/Gait assistance: Min guard Gait Distance (Feet): 5 Feet (toileted; 15; seated at sink; 20 ft to recliner) Assistive device: Rollator (4 wheels) Gait Pattern/deviations:  Step-through pattern, Decreased stride length, Trunk flexed   Gait velocity interpretation: 1.31 - 2.62 ft/sec, indicative of limited community ambulator   General Gait Details: not tested due to leaking rectal pouch; nursing in while pt on BSC to address rectal pouch and bathe patient   Stairs             Wheelchair Mobility    Modified Rankin (Stroke Patients Only)       Balance Overall balance assessment: Needs assistance Sitting-balance support: No upper extremity supported, Feet supported Sitting balance-Leahy Scale: Good     Standing balance support: Bilateral upper extremity supported, Reliant on assistive device for balance Standing balance-Leahy Scale: Poor                              Cognition Arousal/Alertness: Awake/alert Behavior During Therapy: WFL for tasks assessed/performed Overall Cognitive Status: Within Functional Limits for tasks assessed                                          Exercises      General Comments General comments (skin integrity, edema, etc.): session extended due to pt's need for cleanup (purewick leaked), need to have BM (toileted), and desire to brush teeth at sink (had to sit due to fatigue)      Pertinent Vitals/Pain Pain Assessment Pain Assessment: No/denies pain    Home Living  Prior Function            PT Goals (current goals can now be found in the care plan section) Acute Rehab PT Goals Patient Stated Goal: be strong enough to go straight home Time For Goal Achievement: 12/03/21 Potential to Achieve Goals: Good Progress towards PT goals: Progressing toward goals    Frequency    Min 3X/week      PT Plan Current plan remains appropriate    Co-evaluation              AM-PAC PT "6 Clicks" Mobility   Outcome Measure  Help needed turning from your back to your side while in a flat bed without using bedrails?: A Little Help needed  moving from lying on your back to sitting on the side of a flat bed without using bedrails?: A Little Help needed moving to and from a bed to a chair (including a wheelchair)?: A Little Help needed standing up from a chair using your arms (e.g., wheelchair or bedside chair)?: A Little Help needed to walk in hospital room?: A Little Help needed climbing 3-5 steps with a railing? : Total 6 Click Score: 16    End of Session   Activity Tolerance: Patient tolerated treatment well Patient left: in chair;with call bell/phone within reach Nurse Communication: Mobility status PT Visit Diagnosis: Unsteadiness on feet (R26.81);Muscle weakness (generalized) (M62.81)     Time: [id number] PT Time Calculation (min) (ACUTE ONLY): 51 min  Charges:  $Gait Training: 23-37 mins                      Arby Barrette, PT Acute Rehabilitation Services  Pager [phone] Office [phone]    Rexanne Mano 11/20/2021, 10:53 AM

## 2021-11-20 NOTE — Progress Notes (Signed)
PROGRESS NOTE                                                                             PROGRESS NOTE                                                                                                                                                                                                             Patient Demographics:    Courtney [name], is a 76 y.o. female, DOB - [birth year], [id number]  Outpatient Primary MD for the patient is Wenda Low, MD    LOS - 2  Admit date - 11/18/2021    Chief Complaint  Patient presents with   Abdominal Pain   Emesis   Nausea   Weakness       Brief Narrative   Courtney [name] is a 76 y.o. female with medical history significant of bipolar d/o; HTN; hypothyroidism; class 2 obesity; OSA; PE; and CVA presenting with SOB.  She tested positive for COVID on 1/27.  She was seen in the ER for this on 1/27 and was treated with Paxlovid and discharged.  She returned today with worsening symptoms, O2 75% on RA.  She reports that she initially started with nausea, R ear pain, headache behind her L eye, cough.  She came to the ER and was prescribed Paxlovid but was only able to tolerate 2 doses of the medication due to persistent nausea.  She developed vomiting and diarrhea yesterday.  She has been too weak to stand or move much and has fallen twice.  She has not felt particularly SOB as much as she has felt weak. -Her work-up in ED was significant for hypoxia requiring oxygen, imaging significant for colitis, she was started on Solu-Medrol, remdesivir and IV antibiotics.      Subjective:    Courtney [name] today still reports generalized weakness, fatigue, poor appetite, she was unable to tolerate some crackles yesterday, reports abdominal pain still present but improving.      Assessment  & Plan :  Assessment and Plan: COVID-19- (present on admission) -Patient with presenting with refractory symptoms of  weakness/fatigue, n/v/d from worsening COVID-19 infection despite initiation of treatment with Paxlovid -She is fully vaccinated/boosted and has not had prior episode of COVID infection -She does not have a usual home O2 requirement and is currently requiring 2L [place] O2 -COVID POSITIVE -Colitis seen on imaging, likely from [place] (see below) -The patient with multiple comorbidities, at risk for complications from COVID including ARDS, including age, HTN, obesity, asthma, CAD, so she will be started on IV remdesivir, given the fact she was on Paxlovid  at home without much improvement of her symptoms. -Was initially on steroids given she was 75% on room air, but now she is requiring more oxygen, and there is possible bacterial component of her colitis I will discontinue her steroids. -She was encouraged use incentive spirometry, flutter valve, out of bed to chair, and nebulizer treatment as needed. -Patient with elevated D-dimer, most likely as inflammatory markers due to COVID,  will check venous Dopplers.    Colitis with rectal bleeding- (present on admission) -Patient with n/v/d -Had hematochezia on admission, this is most likely due to colitis. -Proctocolitis appreciated on imaging, there is possibly bacterial component to it, as well possibly due to COVID, low possibility inflammatory bowel disease as well like UC. -With IV antibiotics for now including IV Rocephin and azithromycin.  -C. difficile negative.   AKI (acute kidney injury) ([place])- (present on admission) -Likely related to n/v/d and decreased PO intake in the setting of COVID-19 infection, colitis -Resolved with IV hydration -Hold ACE/ARB and avoid nephrotoxic agents if possible -Minimally prolonged QTc is likely related to dehydration and is anticipated to resolve so antiemetics were ordered  CAD (coronary artery disease)- (present on admission) -Incidentally seen on imaging -Continue ASA and high-dose statin -Outpatient  cardiology f/u recommended  OSA (obstructive sleep apnea)- (present on admission) -Continue with CPAP  Dyslipidemia- (present on admission) -Continue high-dose Lipitor  Hypothyroidism (acquired)- (present on admission) -Continue Synthroid  Hypokalemia Repleted, recheck in a.m.  Hypertension- (present on admission) -Blood pressure remains acceptable, likely will resume verapamil tomorrow if started to increase. -Hold olmesartan given AKI  Bipolar disorder ([place])- (present on admission) -Continue home meds - Wellbutrin, Lexapro, Ativan          Family Communication  :  none at bedside   Code Status :  full   Consults  :  None   Disposition Plan  :     Status is: Inpatient   Remains inpatient appropriate because: IV ABX         Status is: Inpatient         DVT Prophylaxis  :  Lovenox   Lab Results  Component Value Date   PLT 149 (L) 11/20/2021    Diet :  Diet Order             Diet clear liquid Room service appropriate? No; Fluid consistency: Thin  Diet effective now                    Inpatient Medications  Scheduled Meds:  vitamin C  500 mg Oral Daily   aspirin  325 mg Oral Daily   atorvastatin  80 mg Oral Daily   buPROPion  150 mg Oral Daily   chlorhexidine  15 mL Mouth Rinse BID   enoxaparin (LOVENOX) injection  40 mg Subcutaneous Q24H   escitalopram  10 mg Oral Daily   fluticasone  2 spray  Each Nare BID   fluticasone  2 puff Inhalation BID   gabapentin  100 mg Oral TID   levothyroxine  50 mcg Oral QAC breakfast   LORazepam  1 mg Oral QHS   mouth rinse  15 mL Mouth Rinse q12n4p   melatonin  5 mg Oral QHS   mirabegron ER  25 mg Oral Daily   montelukast  10 mg Oral Daily   pantoprazole  40 mg Oral Daily   potassium chloride  40 mEq Oral Q6H   sodium chloride flush  3 mL Intravenous Q12H   sodium chloride flush  3 mL Intravenous Q12H   verapamil  360 mg Oral Daily   zinc sulfate  220 mg Oral Daily   Continuous Infusions:   sodium chloride     cefTRIAXone (ROCEPHIN)  IV 2 g (11/20/21 0924)   metronidazole 500 mg (11/20/21 1055)   promethazine (PHENERGAN) injection (IM or IVPB)     remdesivir 100 mg in NS 100 mL 100 mg (11/20/21 0927)   PRN Meds:.sodium chloride, acetaminophen, albuterol, chlorpheniramine-HYDROcodone, cyclobenzaprine, guaiFENesin-dextromethorphan, LORazepam, ondansetron **OR** ondansetron (ZOFRAN) IV, oxyCODONE, promethazine (PHENERGAN) injection (IM or IVPB), sodium chloride flush  Antibiotics  :    Anti-infectives (From admission, onward)    Start     Dose/Rate Route Frequency Ordered Stop   11/19/21 1000  remdesivir 100 mg in sodium chloride 0.9 % 100 mL IVPB       See Hyperspace for full Linked Orders Report.   100 mg 200 mL/hr over 30 Minutes Intravenous Daily 11/18/21 0932 11/23/21 0959   11/19/21 0800  cefTRIAXone (ROCEPHIN) 2 g in sodium chloride 0.9 % 100 mL IVPB        2 g 200 mL/hr over 30 Minutes Intravenous Every 24 hours 11/18/21 0932     11/18/21 0945  remdesivir 200 mg in sodium chloride 0.9% 250 mL IVPB       See Hyperspace for full Linked Orders Report.   200 mg 580 mL/hr over 30 Minutes Intravenous Once 11/18/21 0932 11/18/21 1203   11/18/21 0830  cefTRIAXone (ROCEPHIN) 2 g in sodium chloride 0.9 % 100 mL IVPB        2 g 200 mL/hr over 30 Minutes Intravenous  Once 11/18/21 0829 11/18/21 0937   11/18/21 0830  metroNIDAZOLE (FLAGYL) IVPB 500 mg        500 mg 100 mL/hr over 60 Minutes Intravenous Every 12 hours 11/18/21 0829            Phillips Climes M.D on 11/20/2021 at 12:16 PM  To page go to [email or web address]   [facility] -  Office  [phone]       Objective:   Vitals:   11/20/21 0400 11/20/21 0507 11/20/21 0814 11/20/21 1213  BP:  129/62 138/76 (!) 150/79  Pulse: 65 79 71 72  Resp: 13 16 16 14   Temp:  97.9 F (36.6 C) 98.2 F (36.8 C) 97.7 F (36.5 C)  TempSrc:  Oral Oral Oral  SpO2: 90% 92% 93% 95%  Weight:      Height:         Wt Readings from Last 3 Encounters:  11/18/21 93 kg  11/16/21 93.9 kg  03/28/21 100.4 kg     Intake/Output Summary (Last 24 hours) at 11/20/2021 1216 Last data filed at 11/20/2021 0515 Gross per 24 hour  Intake 580 ml  Output 952 ml  Net -372 ml     Physical Exam  Awake Alert, Oriented X 3, No  new F.N deficits, Normal affect Symmetrical Chest wall movement, Good air movement bilaterally, CTAB RRR,No Gallops,Rubs or new Murmurs, No Parasternal Heave +ve B.Sounds, Abd Soft, left abdomen tenderness improving, No rebound - guarding or rigidity. No Cyanosis, Clubbing or edema, No new Rash or bruise       Data Review:    CBC Recent Labs  Lab 11/16/21 0903 11/18/21 0555 11/19/21 0701 11/20/21 0424  WBC 6.3 14.6* 10.3 9.8  HGB 12.1 13.0 12.5 11.5*  HCT 38.2 41.1 38.4 35.2*  PLT 174 182 150 149*  MCV 92.3 92.8 90.6 89.1  MCH 29.2 29.3 29.5 29.1  MCHC 31.7 31.6 32.6 32.7  RDW 13.2 13.5 13.0 12.9  LYMPHSABS 0.9 0.7 0.8 0.9  MONOABS 1.4* 1.2* 0.3 0.6  EOSABS 0.1 0.0 0.0 0.0  BASOSABS 0.1 0.0 0.0 0.0    Recent Labs  Lab 11/16/21 0903 11/18/21 0550 11/18/21 0555 11/18/21 0619 11/18/21 0750 11/18/21 0852 11/19/21 0701 11/20/21 0424  NA 137  --   --  134*  --   --  137 137  K 3.8  --   --  3.8  --   --  3.7 3.3*  CL 103  --   --  100  --   --  102 101  CO2 27  --   --  27  --   --  28 27  GLUCOSE 91  --   --  123*  --   --  128* 132*  BUN 12  --   --  19  --   --  11 13  CREATININE 0.80  --   --  1.39*  --   --  0.67 0.63  CALCIUM 9.0  --   --  8.0*  --   --  8.7* 8.8*  AST 17  --   --  23  --   --  22 22  ALT 15  --   --  <5  --   --  16 16  ALKPHOS 73  --   --  75  --   --  65 57  BILITOT 0.4  --   --  0.4  --   --  0.5 0.2*  ALBUMIN 3.3*  --   --  3.0*  --   --  2.7* 2.7*  MG  --   --   --   --   --   --  1.7 1.7  CRP  --   --   --   --   --  7.3* 12.7* 5.0*  DDIMER  --   --   --   --   --  1.53* 1.75* 1.52*  PROCALCITON  --   --   --   --   --  5.26   --   --   LATICACIDVEN  --  2.5*  --   --  2.0*  --   --   --   BNP  --   --  52.5  --   --   --   --   --     ------------------------------------------------------------------------------------------------------------------ No results for input(s): CHOL, HDL, LDLCALC, TRIG, CHOLHDL, LDLDIRECT in the last 72 hours.  No results found for: HGBA1C ------------------------------------------------------------------------------------------------------------------ No results for input(s): TSH, T4TOTAL, T3FREE, THYROIDAB in the last 72 hours.  Invalid input(s): FREET3  Cardiac Enzymes No results for input(s): CKMB, TROPONINI, MYOGLOBIN in the last 168 hours.  Invalid input(s): CK ------------------------------------------------------------------------------------------------------------------    Component Value  Date/Time   BNP 52.5 11/18/2021 0555    Micro Results Recent Results (from the past 240 hour(s))  C Difficile Quick Screen w PCR reflex     Status: None   Collection Time: 11/20/21 12:05 AM   Specimen: STOOL  Result Value Ref Range Status   C Diff antigen NEGATIVE NEGATIVE Final   C Diff toxin NEGATIVE NEGATIVE Final   C Diff interpretation No C. difficile detected.  Final    Comment: Performed at [facility], [place] [address] [place], [place] [address]    Radiology Reports DG Chest Portable 1 View  Result Date: 11/18/2021 CLINICAL DATA:  DVA. EXAM: PORTABLE CHEST 1 VIEW COMPARISON:  Two days ago FINDINGS: Cardiomegaly and aortic tortuosity. Elevated right diaphragm. Interstitial crowding at the bases. There is no edema, air bronchogram, effusion, or pneumothorax. IMPRESSION: Stable low volume chest with right diaphragm elevation. Electronically Signed   By: Jorje Guild M.D.   On: 11/18/2021 06:17   DG Chest Portable 1 View  Result Date: 11/16/2021 CLINICAL DATA:  Cough. EXAM: PORTABLE CHEST 1 VIEW COMPARISON:  04/01/2021 FINDINGS: Chronic elevation of the right  hemidiaphragm. Both lungs are clear. Surgical plate in the lower cervical spine. There appears to be surgical hardware in the lower lumbar spine but incompletely evaluated. Heart size is normal. IMPRESSION: 1. No acute cardiopulmonary disease. 2. Chronic elevation of the right hemidiaphragm. Electronically Signed   By: Markus Daft M.D.   On: 11/16/2021 08:45   CT Angio Abd/Pel W and/or Wo Contrast  Result Date: 11/18/2021 CLINICAL DATA:  76 year old female with history of lower GI bleed. Nausea, vomiting, diarrhea and abdominal pain. Diagnosed with COVID yesterday morning. Weakness. EXAM: CTA ABDOMEN AND PELVIS WITHOUT AND WITH CONTRAST TECHNIQUE: Multidetector CT imaging of the abdomen and pelvis was performed using the standard protocol during bolus administration of intravenous contrast. Multiplanar reconstructed images and MIPs were obtained and reviewed to evaluate the vascular anatomy. RADIATION DOSE REDUCTION: This exam was performed according to the departmental dose-optimization program which includes automated exposure control, adjustment of the mA and/or kV according to patient size and/or use of iterative reconstruction technique. CONTRAST:  94mL OMNIPAQUE IOHEXOL 350 MG/ML SOLN COMPARISON:  No priors. FINDINGS: VASCULAR Aorta: Aortic atherosclerosis. Normal caliber aorta without aneurysm, dissection, vasculitis or significant stenosis. Celiac: Patent without evidence of aneurysm, dissection, vasculitis or significant stenosis. SMA: Patent without evidence of aneurysm, dissection, vasculitis or significant stenosis. Renals: Both renal arteries are patent without evidence of aneurysm, dissection, vasculitis, fibromuscular dysplasia or significant stenosis. IMA: Patent without evidence of aneurysm, dissection, vasculitis or significant stenosis. Inflow: Patent without evidence of aneurysm, dissection, vasculitis or significant stenosis. Proximal Outflow: Bilateral common femoral and visualized portions  of the superficial and profunda femoral arteries are patent without evidence of aneurysm, dissection, vasculitis or significant stenosis. Veins: No obvious venous abnormality within the limitations of this arterial phase study. Review of the MIP images confirms the above findings. NON-VASCULAR Lower chest: Elevation of the right hemidiaphragm. Atherosclerotic calcifications in the descending thoracic aorta as well as the left main, left anterior descending, left circumflex and right coronary arteries. Dilatation of the pulmonic trunk (3.7 cm in diameter), concerning for pulmonary arterial hypertension. Subsegmental atelectasis throughout the right lung base. Hepatobiliary: No suspicious cystic or solid hepatic lesions. No intra or extrahepatic biliary ductal dilatation. Status post cholecystectomy. Pancreas: No pancreatic mass. No pancreatic ductal dilatation. No pancreatic or peripancreatic fluid collections or inflammatory changes. Spleen: Unremarkable. Adrenals/Urinary Tract: Subcentimeter low-attenuation lesion in the  upper pole of the left kidney, too small to characterize, but statistically likely to represent a tiny cyst. Right kidney and bilateral adrenal glands are normal in appearance. No hydroureteronephrosis. Urinary bladder is normal in appearance. Stomach/Bowel: The appearance of the stomach is normal. There is no pathologic dilatation of small bowel or colon. Numerous colonic diverticulae are noted. There are inflammatory changes adjacent to the descending colon, which could suggest an acute diverticulitis in this region. However, there is also relatively diffuse colorectal mural thickening which extends to the level of the proximal transverse colon where there is also associated mucosal hyperenhancement and mild hypervascularity in the mesocolon, indicative of colitis. The appendix is not confidently identified and may be surgically absent. Regardless, there are no inflammatory changes noted adjacent  to the cecum to suggest the presence of an acute appendicitis at this time. Lymphatic: No lymphadenopathy noted in the abdomen or pelvis. Reproductive: Status post hysterectomy. Ovaries are not confidently identified may be surgically absent or atrophic. Other: No significant volume of ascites.  No pneumoperitoneum. Musculoskeletal: Orthopedic fixation hardware in place posterior to L3-L5. There are no aggressive appearing lytic or blastic lesions noted in the visualized portions of the skeleton. IMPRESSION: VASCULAR 1. No acute vascular abnormality in the abdomen or pelvis. NON-VASCULAR 1. Diffuse mural thickening, mucosal hyperenhancement and hypervascularity of the associated mesorectum/mesocolon indicative of proctocolitis. This extends to the level of the proximal transverse colon. Clinical correlation for signs and symptoms of ulcerative colitis is recommended. 2. There is also colonic diverticulosis. Focal inflammatory changes are noted adjacent to the descending colon such that acute diverticulitis can not be entirely excluded, however, these findings are relatively mild and could simply be attributable to underlying colitis. 3. Dilatation of the pulmonic trunk (3.7 cm in diameter), concerning for pulmonary arterial hypertension. 4. Aortic atherosclerosis, in addition to left main and three-vessel coronary artery disease. Assessment for potential risk factor modification, dietary therapy or pharmacologic therapy may be warranted, if clinically indicated. 5. Additional incidental findings, as above. Electronically Signed   By: Vinnie Langton M.D.   On: 11/18/2021 08:24                                          Patient ID: KENYOTA LIZAK, female   DOB: 1946/09/27, 76 y.o.   MRN: [id number]

## 2021-11-20 NOTE — Assessment & Plan Note (Addendum)
Due to GI loss-will be repleted prior to discharge.  PCP to reassess in 1 week.

## 2021-11-20 NOTE — Progress Notes (Deleted)
PROGRESS NOTE                                                                             PROGRESS NOTE                                                                                                                                                                                                             Patient Demographics:    Courtney [name], is a 76 y.o. female, DOB - 1946/09/04, [id number]  Outpatient Primary MD for the patient is Wenda Low, MD    LOS - 2  Admit date - 11/18/2021    Chief Complaint  Patient presents with   Abdominal Pain   Emesis   Nausea   Weakness       Brief Narrative    Courtney [name] is a 75 y.o. female with medical history significant of bipolar d/o; HTN; hypothyroidism; class 2 obesity; OSA; PE; and CVA presenting with SOB.  She tested positive for COVID on 1/27.  She was seen in the ER for this on 1/27 and was treated with Paxlovid and discharged.  She returned today with worsening symptoms, O2 75% on RA.  She reports that she initially started with nausea, R ear pain, headache behind her L eye, cough.  She came to the ER and was prescribed Paxlovid but was only able to tolerate 2 doses of the medication due to persistent nausea.  She developed vomiting and diarrhea yesterday.  She has been too weak to stand or move much and has fallen twice.  She has not felt particularly SOB as much as she has felt weak. -Her work-up in ED was significant for hypoxia requiring oxygen, imaging significant for colitis, she was started on Solu-Medrol, remdesivir and IV antibiotics.     Subjective:    Courtney [name] today reports no further diarrhea, but still reports abdominal pain, nausea, and poor appetite, reports dyspnea has improved, she still reports cough.       Assessment  & Plan :  COVID-19- (present on admission) -Patient with presenting with refractory symptoms of weakness/fatigue, n/v/d from worsening COVID-19  infection despite initiation of treatment with Paxlovid -She is fully vaccinated/boosted and has not had prior episode of COVID infection -She does not have a usual home O2 requirement and is currently requiring 2L [place] O2 -COVID POSITIVE -Colitis seen on imaging, likely from COVID (see below) -The patient with multiple comorbidities, at risk for complications from COVID including ARDS, including age, HTN, obesity, asthma, CAD, so she will be started on IV remdesivir, given the fact she was on Paxil with at home without much improvement of her symptoms. -Initially with hypoxia 75% on room air, so we will continue with IV steroids as well. -She was encouraged use incentive spirometry, flutter valve, out of bed to chair, and nebulizer treatment as needed.    Colitis with rectal bleeding- (present on admission) -Patient with n/v/d -Had hematochezia on admission, this is most likely due to colitis. -Proctocolitis appreciated on imaging, most likely due to COVID, but there is possibly bacterial component, so continue with IV Rocephin and azithromycin. -Suspect COVID-associated colitis -We will check for C. Difficile  AKI (acute kidney injury) (HCC)- (present on admission) -Likely related to n/v/d and decreased PO intake in the setting of COVID-19 infection, colitis -Resolved with IV hydration -Hold ACE/ARB and avoid nephrotoxic agents if possible -Minimally prolonged QTc is likely related to dehydration and is anticipated to resolve so antiemetics were ordered  CAD (coronary artery disease)- (present on admission) -Incidentally seen on imaging -Continue ASA and high-dose statin -Outpatient cardiology f/u recommended  OSA (obstructive sleep apnea)- (present on admission) -Continue with CPAP  Dyslipidemia- (present on admission) -Continue high-dose Lipitor  Hypothyroidism (acquired)- (present on admission) -Continue Synthroid  Hypertension- (present on admission) -Blood pressure remains  acceptable, likely will resume verapamil tomorrow if started to increase. -Hold olmesartan given AKI  Bipolar disorder (HCC)- (present on admission) -Continue home meds - Wellbutrin, Lexapro, Ativan        Family Communication  :  none at bedside  Code Status :  full  Consults  :  None  Disposition Plan  :    Status is: Inpatient  Remains inpatient appropriate because: IV ABX      DVT Prophylaxis  :  Lovenox   Lab Results  Component Value Date   PLT 149 (L) 11/20/2021    Diet :  Diet Order             Diet clear liquid Room service appropriate? No; Fluid consistency: Thin  Diet effective now                    Inpatient Medications  Scheduled Meds:  vitamin C  500 mg Oral Daily   aspirin  325 mg Oral Daily   atorvastatin  80 mg Oral Daily   buPROPion  150 mg Oral Daily   chlorhexidine  15 mL Mouth Rinse BID   enoxaparin (LOVENOX) injection  40 mg Subcutaneous Q24H   escitalopram  10 mg Oral Daily   fluticasone  2 spray Each Nare BID   fluticasone  2 puff Inhalation BID   gabapentin  100 mg Oral TID   levothyroxine  50 mcg Oral QAC breakfast   LORazepam  1 mg Oral QHS   mouth rinse  15 mL Mouth Rinse q12n4p   melatonin  5 mg Oral QHS   mirabegron ER  25 mg Oral Daily   montelukast  10 mg Oral Daily  pantoprazole  40 mg Oral Daily   potassium chloride  40 mEq Oral Q6H   sodium chloride flush  3 mL Intravenous Q12H   sodium chloride flush  3 mL Intravenous Q12H   verapamil  360 mg Oral Daily   zinc sulfate  220 mg Oral Daily   Continuous Infusions:  sodium chloride     cefTRIAXone (ROCEPHIN)  IV 2 g (11/20/21 0924)   metronidazole 500 mg (11/20/21 1055)   promethazine (PHENERGAN) injection (IM or IVPB)     remdesivir 100 mg in NS 100 mL 100 mg (11/20/21 0927)   PRN Meds:.sodium chloride, acetaminophen, albuterol, chlorpheniramine-HYDROcodone, cyclobenzaprine, guaiFENesin-dextromethorphan, LORazepam, ondansetron **OR** ondansetron (ZOFRAN)  IV, oxyCODONE, promethazine (PHENERGAN) injection (IM or IVPB), sodium chloride flush  Antibiotics  :    Anti-infectives (From admission, onward)    Start     Dose/Rate Route Frequency Ordered Stop   11/19/21 1000  remdesivir 100 mg in sodium chloride 0.9 % 100 mL IVPB       See Hyperspace for full Linked Orders Report.   100 mg 200 mL/hr over 30 Minutes Intravenous Daily 11/18/21 0932 11/23/21 0959   11/19/21 0800  cefTRIAXone (ROCEPHIN) 2 g in sodium chloride 0.9 % 100 mL IVPB        2 g 200 mL/hr over 30 Minutes Intravenous Every 24 hours 11/18/21 0932     11/18/21 0945  remdesivir 200 mg in sodium chloride 0.9% 250 mL IVPB       See Hyperspace for full Linked Orders Report.   200 mg 580 mL/hr over 30 Minutes Intravenous Once 11/18/21 0932 11/18/21 1203   11/18/21 0830  cefTRIAXone (ROCEPHIN) 2 g in sodium chloride 0.9 % 100 mL IVPB        2 g 200 mL/hr over 30 Minutes Intravenous  Once 11/18/21 0829 11/18/21 0937   11/18/21 0830  metroNIDAZOLE (FLAGYL) IVPB 500 mg        500 mg 100 mL/hr over 60 Minutes Intravenous Every 12 hours 11/18/21 0829            Courtney [name] M.D on 11/20/2021 at 12:12 PM  To page go to [email or web address]   [facility] -  Office  [phone]       Objective:   Vitals:   11/20/21 0200 11/20/21 0400 11/20/21 0507 11/20/21 0814  BP:   129/62 138/76  Pulse: 69 65 79 71  Resp: 15 13 16 16   Temp:   97.9 F (36.6 C) 98.2 F (36.8 C)  TempSrc:   Oral Oral  SpO2: 91% 90% 92% 93%  Weight:      Height:        Wt Readings from Last 3 Encounters:  11/18/21 93 kg  11/16/21 93.9 kg  03/28/21 100.4 kg     Intake/Output Summary (Last 24 hours) at 11/20/2021 1212 Last data filed at 11/20/2021 0515 Gross per 24 hour  Intake 580 ml  Output 952 ml  Net -372 ml     Physical Exam  Awake Alert, Oriented X 3, No new F.N deficits, Normal affect Symmetrical Chest wall movement, Good air movement bilaterally, CTAB RRR,No Gallops,Rubs  or new Murmurs, No Parasternal Heave +ve B.Sounds, Abd Soft, mild tenderness in left abdomen, No rebound - guarding or rigidity. No Cyanosis, Clubbing or edema, No new Rash or bruise       Data Review:    CBC Recent Labs  Lab 11/16/21 0903 11/18/21 0555 11/19/21 0701 11/20/21 0424  WBC 6.3 14.6* 10.3 9.8  HGB 12.1 13.0 12.5 11.5*  HCT 38.2 41.1 38.4 35.2*  PLT 174 182 150 149*  MCV 92.3 92.8 90.6 89.1  MCH 29.2 29.3 29.5 29.1  MCHC 31.7 31.6 32.6 32.7  RDW 13.2 13.5 13.0 12.9  LYMPHSABS 0.9 0.7 0.8 0.9  MONOABS 1.4* 1.2* 0.3 0.6  EOSABS 0.1 0.0 0.0 0.0  BASOSABS 0.1 0.0 0.0 0.0    Recent Labs  Lab 11/16/21 0903 11/18/21 0550 11/18/21 0555 11/18/21 0619 11/18/21 0750 11/18/21 0852 11/19/21 0701 11/20/21 0424  NA 137  --   --  134*  --   --  137 137  K 3.8  --   --  3.8  --   --  3.7 3.3*  CL 103  --   --  100  --   --  102 101  CO2 27  --   --  27  --   --  28 27  GLUCOSE 91  --   --  123*  --   --  128* 132*  BUN 12  --   --  19  --   --  11 13  CREATININE 0.80  --   --  1.39*  --   --  0.67 0.63  CALCIUM 9.0  --   --  8.0*  --   --  8.7* 8.8*  AST 17  --   --  23  --   --  22 22  ALT 15  --   --  <5  --   --  16 16  ALKPHOS 73  --   --  75  --   --  65 57  BILITOT 0.4  --   --  0.4  --   --  0.5 0.2*  ALBUMIN 3.3*  --   --  3.0*  --   --  2.7* 2.7*  MG  --   --   --   --   --   --  1.7 1.7  CRP  --   --   --   --   --  7.3* 12.7* 5.0*  DDIMER  --   --   --   --   --  1.53* 1.75* 1.52*  PROCALCITON  --   --   --   --   --  5.26  --   --   LATICACIDVEN  --  2.5*  --   --  2.0*  --   --   --   BNP  --   --  52.5  --   --   --   --   --     ------------------------------------------------------------------------------------------------------------------ No results for input(s): CHOL, HDL, LDLCALC, TRIG, CHOLHDL, LDLDIRECT in the last 72 hours.  No results found for:  HGBA1C ------------------------------------------------------------------------------------------------------------------ No results for input(s): TSH, T4TOTAL, T3FREE, THYROIDAB in the last 72 hours.  Invalid input(s): FREET3  Cardiac Enzymes No results for input(s): CKMB, TROPONINI, MYOGLOBIN in the last 168 hours.  Invalid input(s): CK ------------------------------------------------------------------------------------------------------------------    Component Value Date/Time   BNP 52.5 11/18/2021 0555    Micro Results Recent Results (from the past 240 hour(s))  C Difficile Quick Screen w PCR reflex     Status: None   Collection Time: 11/20/21 12:05 AM   Specimen: STOOL  Result Value Ref Range Status   C Diff antigen NEGATIVE NEGATIVE Final   C Diff toxin NEGATIVE NEGATIVE Final   C Diff interpretation No C. difficile detected.  Final  Comment: Performed at [facility], [place] [address] [place], [place] [address]    Radiology Reports DG Chest Portable 1 View  Result Date: 11/18/2021 CLINICAL DATA:  DVA. EXAM: PORTABLE CHEST 1 VIEW COMPARISON:  Two days ago FINDINGS: Cardiomegaly and aortic tortuosity. Elevated right diaphragm. Interstitial crowding at the bases. There is no edema, air bronchogram, effusion, or pneumothorax. IMPRESSION: Stable low volume chest with right diaphragm elevation. Electronically Signed   By: Jorje Guild M.D.   On: 11/18/2021 06:17   DG Chest Portable 1 View  Result Date: 11/16/2021 CLINICAL DATA:  Cough. EXAM: PORTABLE CHEST 1 VIEW COMPARISON:  04/01/2021 FINDINGS: Chronic elevation of the right hemidiaphragm. Both lungs are clear. Surgical plate in the lower cervical spine. There appears to be surgical hardware in the lower lumbar spine but incompletely evaluated. Heart size is normal. IMPRESSION: 1. No acute cardiopulmonary disease. 2. Chronic elevation of the right hemidiaphragm. Electronically Signed   By: Markus Daft M.D.   On:  11/16/2021 08:45   CT Angio Abd/Pel W and/or Wo Contrast  Result Date: 11/18/2021 CLINICAL DATA:  76 year old female with history of lower GI bleed. Nausea, vomiting, diarrhea and abdominal pain. Diagnosed with COVID yesterday morning. Weakness. EXAM: CTA ABDOMEN AND PELVIS WITHOUT AND WITH CONTRAST TECHNIQUE: Multidetector CT imaging of the abdomen and pelvis was performed using the standard protocol during bolus administration of intravenous contrast. Multiplanar reconstructed images and MIPs were obtained and reviewed to evaluate the vascular anatomy. RADIATION DOSE REDUCTION: This exam was performed according to the departmental dose-optimization program which includes automated exposure control, adjustment of the mA and/or kV according to patient size and/or use of iterative reconstruction technique. CONTRAST:  70mL OMNIPAQUE IOHEXOL 350 MG/ML SOLN COMPARISON:  No priors. FINDINGS: VASCULAR Aorta: Aortic atherosclerosis. Normal caliber aorta without aneurysm, dissection, vasculitis or significant stenosis. Celiac: Patent without evidence of aneurysm, dissection, vasculitis or significant stenosis. SMA: Patent without evidence of aneurysm, dissection, vasculitis or significant stenosis. Renals: Both renal arteries are patent without evidence of aneurysm, dissection, vasculitis, fibromuscular dysplasia or significant stenosis. IMA: Patent without evidence of aneurysm, dissection, vasculitis or significant stenosis. Inflow: Patent without evidence of aneurysm, dissection, vasculitis or significant stenosis. Proximal Outflow: Bilateral common femoral and visualized portions of the superficial and profunda femoral arteries are patent without evidence of aneurysm, dissection, vasculitis or significant stenosis. Veins: No obvious venous abnormality within the limitations of this arterial phase study. Review of the MIP images confirms the above findings. NON-VASCULAR Lower chest: Elevation of the right  hemidiaphragm. Atherosclerotic calcifications in the descending thoracic aorta as well as the left main, left anterior descending, left circumflex and right coronary arteries. Dilatation of the pulmonic trunk (3.7 cm in diameter), concerning for pulmonary arterial hypertension. Subsegmental atelectasis throughout the right lung base. Hepatobiliary: No suspicious cystic or solid hepatic lesions. No intra or extrahepatic biliary ductal dilatation. Status post cholecystectomy. Pancreas: No pancreatic mass. No pancreatic ductal dilatation. No pancreatic or peripancreatic fluid collections or inflammatory changes. Spleen: Unremarkable. Adrenals/Urinary Tract: Subcentimeter low-attenuation lesion in the upper pole of the left kidney, too small to characterize, but statistically likely to represent a tiny cyst. Right kidney and bilateral adrenal glands are normal in appearance. No hydroureteronephrosis. Urinary bladder is normal in appearance. Stomach/Bowel: The appearance of the stomach is normal. There is no pathologic dilatation of small bowel or colon. Numerous colonic diverticulae are noted. There are inflammatory changes adjacent to the descending colon, which could suggest an acute diverticulitis in this region. However, there is  also relatively diffuse colorectal mural thickening which extends to the level of the proximal transverse colon where there is also associated mucosal hyperenhancement and mild hypervascularity in the mesocolon, indicative of colitis. The appendix is not confidently identified and may be surgically absent. Regardless, there are no inflammatory changes noted adjacent to the cecum to suggest the presence of an acute appendicitis at this time. Lymphatic: No lymphadenopathy noted in the abdomen or pelvis. Reproductive: Status post hysterectomy. Ovaries are not confidently identified may be surgically absent or atrophic. Other: No significant volume of ascites.  No pneumoperitoneum.  Musculoskeletal: Orthopedic fixation hardware in place posterior to L3-L5. There are no aggressive appearing lytic or blastic lesions noted in the visualized portions of the skeleton. IMPRESSION: VASCULAR 1. No acute vascular abnormality in the abdomen or pelvis. NON-VASCULAR 1. Diffuse mural thickening, mucosal hyperenhancement and hypervascularity of the associated mesorectum/mesocolon indicative of proctocolitis. This extends to the level of the proximal transverse colon. Clinical correlation for signs and symptoms of ulcerative colitis is recommended. 2. There is also colonic diverticulosis. Focal inflammatory changes are noted adjacent to the descending colon such that acute diverticulitis can not be entirely excluded, however, these findings are relatively mild and could simply be attributable to underlying colitis. 3. Dilatation of the pulmonic trunk (3.7 cm in diameter), concerning for pulmonary arterial hypertension. 4. Aortic atherosclerosis, in addition to left main and three-vessel coronary artery disease. Assessment for potential risk factor modification, dietary therapy or pharmacologic therapy may be warranted, if clinically indicated. 5. Additional incidental findings, as above. Electronically Signed   By: Vinnie Langton M.D.   On: 11/18/2021 08:24                                          Patient ID: ADELENE HEHIR, female   DOB: [birth year], 76 y.o.   MRN: [id number] Patient ID: MELLY BOWAR, female   DOB: 1946-03-05, 76 y.o.   MRN: [id number]

## 2021-11-20 NOTE — Progress Notes (Signed)
Pt refused CPAP for tonight.  

## 2021-11-21 ENCOUNTER — Inpatient Hospital Stay (HOSPITAL_COMMUNITY): Payer: Medicare HMO

## 2021-11-21 DIAGNOSIS — R7989 Other specified abnormal findings of blood chemistry: Secondary | ICD-10-CM

## 2021-11-21 DIAGNOSIS — U071 COVID-19: Secondary | ICD-10-CM | POA: Diagnosis not present

## 2021-11-21 LAB — COMPREHENSIVE METABOLIC PANEL
ALT: 20 U/L (ref 0–44)
AST: 30 U/L (ref 15–41)
Albumin: 2.7 g/dL — ABNORMAL LOW (ref 3.5–5.0)
Alkaline Phosphatase: 59 U/L (ref 38–126)
Anion gap: 7 (ref 5–15)
BUN: 10 mg/dL (ref 8–23)
CO2: 28 mmol/L (ref 22–32)
Calcium: 8.9 mg/dL (ref 8.9–10.3)
Chloride: 101 mmol/L (ref 98–111)
Creatinine, Ser: 0.66 mg/dL (ref 0.44–1.00)
GFR, Estimated: >60 mL/min (ref >60)
Glucose, Bld: 119 mg/dL — ABNORMAL HIGH (ref 70–99)
Potassium: 3.6 mmol/L (ref 3.5–5.1)
Sodium: 136 mmol/L (ref 135–145)
Total Bilirubin: 0.4 mg/dL (ref 0.3–1.2)
Total Protein: 5.2 g/dL — ABNORMAL LOW (ref 6.5–8.1)

## 2021-11-21 LAB — CBC WITH DIFFERENTIAL/PLATELET
Abs Immature Granulocytes: 0.05 10*3/uL (ref 0.00–0.07)
Basophils Absolute: 0 10*3/uL (ref 0.0–0.1)
Basophils Relative: 0 %
Eosinophils Absolute: 0 10*3/uL (ref 0.0–0.5)
Eosinophils Relative: 0 %
HCT: 34 % — ABNORMAL LOW (ref 36.0–46.0)
Hemoglobin: 10.8 g/dL — ABNORMAL LOW (ref 12.0–15.0)
Immature Granulocytes: 0 %
Lymphocytes Relative: 6 %
Lymphs Abs: 0.7 10*3/uL (ref 0.7–4.0)
MCH: 28.3 pg (ref 26.0–34.0)
MCHC: 31.8 g/dL (ref 30.0–36.0)
MCV: 89.2 fL (ref 80.0–100.0)
Monocytes Absolute: 0.9 10*3/uL (ref 0.1–1.0)
Monocytes Relative: 8 %
Neutro Abs: 10.3 10*3/uL — ABNORMAL HIGH (ref 1.7–7.7)
Neutrophils Relative %: 86 %
Platelets: 151 10*3/uL (ref 150–400)
RBC: 3.81 MIL/uL — ABNORMAL LOW (ref 3.87–5.11)
RDW: 13.1 % (ref 11.5–15.5)
WBC: 11.9 10*3/uL — ABNORMAL HIGH (ref 4.0–10.5)
nRBC: 0 % (ref 0.0–0.2)

## 2021-11-21 NOTE — Progress Notes (Signed)
Occupational Therapy Treatment Patient Details Name: Courtney [name] MRN: [id number] DOB: [birth year] Today's Date: 11/21/2021   History of present illness 76 y.o. female presenting 11/18/21 with SOB.  She tested positive for COVID on 1/27 and started Paxlovid. Developed N/V/D; colitis;    PMH significant for bipolar disorder, HTN, mitral valve prolapse, claustrophobia, CVA, vertigo, PE, prior back surgery, B knee replacements.   OT comments  Pt making progress with functional goals. Session focused on getting OOB to rollater to walk to sink for oral care, hygiene and grooming. Pt sat up in recliner earlier and politely declined to return to recliner at end of session. OT will continue to follow acutely to maximize level of function and safety   Recommendations for follow up therapy are one component of a multi-disciplinary discharge planning process, led by the attending physician.  Recommendations may be updated based on patient status, additional functional criteria and insurance authorization.    Follow Up Recommendations  Home health OT    Assistance Recommended at Discharge Frequent or constant Supervision/Assistance  Patient can return home with the following  A little help with bathing/dressing/bathroom;A little help with walking and/or transfers;Assistance with cooking/housework;Assist for transportation   Equipment Recommendations  None recommended by OT    Recommendations for Other Services      Precautions / Restrictions Precautions Precautions: Fall       Mobility Bed Mobility Overal bed mobility: Needs Assistance       Supine to sit: Min guard, HOB elevated          Transfers Overall transfer level: Needs assistance Equipment used: Rolling walker (2 wheels) Transfers: Sit to/from Stand Sit to Stand: Min guard     Step pivot transfers: Min guard     General transfer comment: ambulated to sink with rollater     Balance Overall balance assessment: Needs  assistance Sitting-balance support: No upper extremity supported, Feet supported Sitting balance-Leahy Scale: Good     Standing balance support: Bilateral upper extremity supported, Reliant on assistive device for balance Standing balance-Leahy Scale: Poor                             ADL either performed or assessed with clinical judgement   ADL Overall ADL's : Needs assistance/impaired     Grooming: Wash/dry hands;Wash/dry face;Oral care;Brushing hair;Min guard;Standing;Sitting                   Toilet Transfer: Min guard;Ambulation;Rollator (4 wheels)   Toileting- Clothing Manipulation and Hygiene: Minimal assistance;Min guard;Sit to/from stand       Functional mobility during ADLs: Engineer, manufacturing (4 wheels)      Extremity/Trunk Assessment Upper Extremity Assessment Upper Extremity Assessment: Generalized weakness   Lower Extremity Assessment Lower Extremity Assessment: Defer to PT evaluation   Cervical / Trunk Assessment Cervical / Trunk Assessment: Other exceptions Cervical / Trunk Exceptions: overweight    Vision Baseline Vision/History: 1 Wears glasses Ability to See in Adequate Light: 0 Adequate Patient Visual Report: No change from baseline     Perception     Praxis      Cognition Arousal/Alertness: Awake/alert Behavior During Therapy: WFL for tasks assessed/performed Overall Cognitive Status: Within Functional Limits for tasks assessed  Exercises      Shoulder Instructions       General Comments      Pertinent Vitals/ Pain       Pain Assessment Pain Assessment: No/denies pain  Home Living                                          Prior Functioning/Environment              Frequency  Min 2X/week        Progress Toward Goals  OT Goals(current goals can now be found in the care plan section)  Progress towards OT goals: Progressing  toward goals     Plan Discharge plan remains appropriate;Frequency remains appropriate    Co-evaluation                 AM-PAC OT "6 Clicks" Daily Activity     Outcome Measure   Help from another person eating meals?: None Help from another person taking care of personal grooming?: A Little Help from another person toileting, which includes using toliet, bedpan, or urinal?: A Little Help from another person bathing (including washing, rinsing, drying)?: A Lot Help from another person to put on and taking off regular upper body clothing?: A Little Help from another person to put on and taking off regular lower body clothing?: A Lot 6 Click Score: 17    End of Session Equipment Utilized During Treatment: Rollator (4 wheels)  OT Visit Diagnosis: Unsteadiness on feet (R26.81);Other abnormalities of gait and mobility (R26.89);Muscle weakness (generalized) (M62.81)   Activity Tolerance Patient tolerated treatment well   Patient Left in bed;with call bell/phone within reach   Nurse Communication          Time: 1441-1459 OT Time Calculation (min): 18 min  Charges: OT General Charges $OT Visit: 1 Visit OT Treatments $Self Care/Home Management : 8-22 mins    Britt Bottom 11/21/2021, 3:47 PM

## 2021-11-21 NOTE — Plan of Care (Signed)

## 2021-11-21 NOTE — Progress Notes (Signed)
Pt on RA, alert. Pt refusing cpap at this time. No resp distress. Will continue to monitor.

## 2021-11-21 NOTE — Progress Notes (Addendum)
PROGRESS NOTE        PATIENT DETAILS Name: Courtney [name] Age: 76 y.o. Sex: female Date of Birth: [birth year] Admit Date: 11/18/2021 Admitting Physician Karmen Bongo, MD [id number], [place] Ar, MD  Brief Summary: 76 year old with history of bipolar disorder, hypothyroidism, HLD, CAD, HTN, bipolar disorder-who was seen in the ED on 1/27-diagnosed with COVID-19 and discharged on Paxlovid, she returned to the ED on 1/29 with vomiting/diarrhea and abdominal pain.  She was subsequently found to have colitis, AKI and admitted to the hospitalist service.  See below for further details.  Significant events: 1/29>> admit to [facility]-COVID/colitis/AKI  Significant imaging studies: 1/29>> CTA abdomen: Colitis extending from rectum to proximal transverse colon 1/29>> CXR: No pneumonia  Pertinent microbiology data: 1/31>> stool C. difficile PCR: Negative   Subjective: Lying comfortably in bed-denies any chest pain or shortness of breath.  Continues to have some loose stools but overall improved.  Objective: Vitals: Blood pressure (!) 141/78, pulse 61, temperature 98.4 F (36.9 C), temperature source Oral, resp. rate 20, height 5' 2.5" (1.588 m), weight 93 kg, SpO2 98 %.   Exam: Gen Exam:Alert awake-not in any distress HEENT:atraumatic, normocephalic Chest: B/L clear to auscultation anteriorly CVS:S1S2 regular Abdomen:soft non tender, non distended Extremities:no edema Neurology: Non focal Skin: no rash  Pertinent Labs/Radiology: CBC Latest Ref Rng & Units 11/21/2021 11/20/2021 11/19/2021  WBC 4.0 - 10.5 K/uL 11.9(H) 9.8 10.3  Hemoglobin 12.0 - 15.0 g/dL 10.8(L) 11.5(L) 12.5  Hematocrit 36.0 - 46.0 % 34.0(L) 35.2(L) 38.4  Platelets 150 - 400 K/uL 151 149(L) 150    Lab Results  Component Value Date   NA 136 11/21/2021   K 3.6 11/21/2021   CL 101 11/21/2021   CO2 28 11/21/2021      Assessment/Plan: * COVID-19 infection with colitis- (present on  admission) Still with loose stools-but frequency decreasing.  Unclear whether she has colitis from COVID-19 or from a bacterial infection.  Remains on Remdesivir/Rocephin/Flagyl.  Continue supportive care-if diarrhea does not resolve-may need GI input at some point.  AKI (acute kidney injury) ([place])- (present on admission) Hemodynamically mediated-resolved with supportive care.  Hypokalemia- (present on admission) Repleted.  CAD (coronary artery disease)- (present on admission) Seen incidentally on CT angio abdomen-no anginal symptoms.  Continue aspirin/statin-we will need cardiology evaluation in the outpatient setting when she has recovered from acute illness.  OSA (obstructive sleep apnea)- (present on admission) Continue with CPAP  Dyslipidemia- (present on admission) Continue high-dose Lipitor  Hypothyroidism (acquired)- (present on admission) Continue Synthroid  Hypertension- (present on admission) BP slowly creeping up-continue verapamil-reassess and add other agents as appropriate over the next few days.  Bipolar disorder ([place])- (present on admission) -Continue home meds - Wellbutrin, Lexapro, Ativan  Obesity: Estimated body mass index is 36.9 kg/m as calculated from the following:   Height as of this encounter: 5' 2.5" (1.588 m).   Weight as of this encounter: 93 kg.   DVT Prophylaxis: SQ Lovenox Procedures: None Consults: None Code Status:Full code  Family Communication: None at bedside   Disposition Plan: Status is: Inpatient Remains inpatient appropriate because: COVID-19-resolving diarrhea-on IV antimicrobial therapy.  Planned Discharge Destination: Home   Diet: Diet Order             Diet clear liquid Room service appropriate? No; Fluid consistency: Thin  Diet effective now  Antimicrobial agents: Anti-infectives (From admission, onward)    Start     Dose/Rate Route Frequency Ordered Stop   11/19/21 1000  remdesivir 100  mg in sodium chloride 0.9 % 100 mL IVPB       See Hyperspace for full Linked Orders Report.   100 mg 200 mL/hr over 30 Minutes Intravenous Daily 11/18/21 0932 11/23/21 0959   11/19/21 0800  cefTRIAXone (ROCEPHIN) 2 g in sodium chloride 0.9 % 100 mL IVPB        2 g 200 mL/hr over 30 Minutes Intravenous Every 24 hours 11/18/21 0932     11/18/21 0945  remdesivir 200 mg in sodium chloride 0.9% 250 mL IVPB       See Hyperspace for full Linked Orders Report.   200 mg 580 mL/hr over 30 Minutes Intravenous Once 11/18/21 0932 11/18/21 1203   11/18/21 0830  cefTRIAXone (ROCEPHIN) 2 g in sodium chloride 0.9 % 100 mL IVPB        2 g 200 mL/hr over 30 Minutes Intravenous  Once 11/18/21 0829 11/18/21 0937   11/18/21 0830  metroNIDAZOLE (FLAGYL) IVPB 500 mg        500 mg 100 mL/hr over 60 Minutes Intravenous Every 12 hours 11/18/21 0829          MEDICATIONS: Scheduled Meds:  vitamin C  500 mg Oral Daily   aspirin  325 mg Oral Daily   atorvastatin  80 mg Oral Daily   buPROPion  150 mg Oral Daily   chlorhexidine  15 mL Mouth Rinse BID   enoxaparin (LOVENOX) injection  40 mg Subcutaneous Q24H   escitalopram  10 mg Oral Daily   fluticasone  2 spray Each Nare BID   fluticasone  2 puff Inhalation BID   gabapentin  100 mg Oral TID   levothyroxine  50 mcg Oral QAC breakfast   LORazepam  1 mg Oral QHS   mouth rinse  15 mL Mouth Rinse q12n4p   melatonin  5 mg Oral QHS   mirabegron ER  25 mg Oral Daily   montelukast  10 mg Oral Daily   pantoprazole  40 mg Oral Daily   sodium chloride flush  3 mL Intravenous Q12H   sodium chloride flush  3 mL Intravenous Q12H   verapamil  360 mg Oral Daily   zinc sulfate  220 mg Oral Daily   Continuous Infusions:  sodium chloride     cefTRIAXone (ROCEPHIN)  IV 2 g (11/21/21 1034)   metronidazole Stopped (11/21/21 0012)   promethazine (PHENERGAN) injection (IM or IVPB)     remdesivir 100 mg in NS 100 mL Stopped (11/20/21 0957)   PRN Meds:.sodium chloride,  acetaminophen, albuterol, chlorpheniramine-HYDROcodone, cyclobenzaprine, guaiFENesin-dextromethorphan, LORazepam, ondansetron **OR** ondansetron (ZOFRAN) IV, oxyCODONE, promethazine (PHENERGAN) injection (IM or IVPB), sodium chloride flush   I have personally reviewed following labs and imaging studies  LABORATORY DATA: CBC: Recent Labs  Lab 11/16/21 0903 11/18/21 0555 11/19/21 0701 11/20/21 0424 11/21/21 0318  WBC 6.3 14.6* 10.3 9.8 11.9*  NEUTROABS 3.9 12.6* 9.2* 8.3* 10.3*  HGB 12.1 13.0 12.5 11.5* 10.8*  HCT 38.2 41.1 38.4 35.2* 34.0*  MCV 92.3 92.8 90.6 89.1 89.2  PLT 174 182 150 149* [id number]    Basic Metabolic Panel: Recent Labs  Lab 11/16/21 0903 11/18/21 0619 11/19/21 0701 11/20/21 0424 11/21/21 0318  NA 137 134* 137 137 136  K 3.8 3.8 3.7 3.3* 3.6  CL 103 100 102 101 101  CO2 27 27 28 27  28  GLUCOSE 91 123* 128* 132* 119*  BUN 12 19 11 13 10   CREATININE 0.80 1.39* 0.67 0.63 0.66  CALCIUM 9.0 8.0* 8.7* 8.8* 8.9  MG  --   --  1.7 1.7  --   PHOS  --   --  3.1 2.6  --     GFR: Estimated Creatinine Clearance: 67.2 mL/min (by C-G formula based on SCr of 0.66 mg/dL).  Liver Function Tests: Recent Labs  Lab 11/16/21 0903 11/18/21 0619 11/19/21 0701 11/20/21 0424 11/21/21 0318  AST 17 23 22 22 30   ALT 15 5 16 16 20   ALKPHOS 73 75 65 57 59  BILITOT 0.4 0.4 0.5 0.2* 0.4  PROT 6.0* 5.7* 5.6* 5.5* 5.2*  ALBUMIN 3.3* 3.0* 2.7* 2.7* 2.7*   Recent Labs  Lab 11/18/21 0619  LIPASE 22   No results for input(s): AMMONIA in the last 168 hours.  Coagulation Profile: No results for input(s): INR, PROTIME in the last 168 hours.  Cardiac Enzymes: No results for input(s): CKTOTAL, CKMB, CKMBINDEX, TROPONINI in the last 168 hours.  BNP (last 3 results) No results for input(s): PROBNP in the last 8760 hours.  Lipid Profile: No results for input(s): CHOL, HDL, LDLCALC, TRIG, CHOLHDL, LDLDIRECT in the last 72 hours.  Thyroid Function Tests: No results for  input(s): TSH, T4TOTAL, FREET4, T3FREE, THYROIDAB in the last 72 hours.  Anemia Panel: Recent Labs    11/19/21 0701 11/20/21 0424  FERRITIN 138 186    Urine analysis:    Component Value Date/Time   COLORURINE YELLOW 11/16/2021 0828   APPEARANCEUR CLEAR 11/16/2021 0828   LABSPEC 1.008 11/16/2021 0828   PHURINE 5.0 11/16/2021 0828   GLUCOSEU NEGATIVE 11/16/2021 0828   HGBUR NEGATIVE 11/16/2021 0828   BILIRUBINUR NEGATIVE 11/16/2021 0828   KETONESUR NEGATIVE 11/16/2021 0828   PROTEINUR NEGATIVE 11/16/2021 0828   NITRITE NEGATIVE 11/16/2021 0828   LEUKOCYTESUR MODERATE (A) 11/16/2021 0828    Sepsis Labs: Lactic Acid, Venous    Component Value Date/Time   LATICACIDVEN 2.0 ([place]) 11/18/2021 0750    MICROBIOLOGY: Recent Results (from the past 240 hour(s))  C Difficile Quick Screen w PCR reflex     Status: None   Collection Time: 11/20/21 12:05 AM   Specimen: STOOL  Result Value Ref Range Status   C Diff antigen NEGATIVE NEGATIVE Final   C Diff toxin NEGATIVE NEGATIVE Final   C Diff interpretation No C. difficile detected.  Final    Comment: Performed at [facility], [place] [address] [place], [place] [address]  Resp Panel by RT-PCR (Flu A&B, Covid) Nasopharyngeal Swab     Status: Abnormal   Collection Time: 11/20/21  7:15 AM   Specimen: Nasopharyngeal Swab; Nasopharyngeal(NP) swabs in vial transport medium  Result Value Ref Range Status   SARS Coronavirus 2 by RT PCR POSITIVE (A) NEGATIVE Final    Comment: (NOTE) SARS-CoV-2 target nucleic acids are DETECTED.  The SARS-CoV-2 RNA is generally detectable in upper respiratory specimens during the acute phase of infection. Positive results are indicative of the presence of the identified virus, but do not rule out bacterial infection or co-infection with other pathogens not detected by the test. Clinical correlation with patient history and other diagnostic information is necessary to determine patient infection  status. The expected result is Negative.  Fact Sheet for Patients: EntrepreneurPulse.com.au  Fact Sheet for Healthcare Providers: IncredibleEmployment.be  This test is not yet approved or cleared by the [place] and  has been authorized  for detection and/or diagnosis of SARS-CoV-2 by FDA under an Emergency Use Authorization (EUA).  This EUA will remain in effect (meaning this test can be used) for the duration of  the COVID-19 declaration under Section 564(b)(1) of the A ct, 21 U.S.C. section 360bbb-3(b)(1), unless the authorization is terminated or revoked sooner.     Influenza A by PCR NEGATIVE NEGATIVE Final   Influenza B by PCR NEGATIVE NEGATIVE Final    Comment: (NOTE) The Xpert Xpress SARS-CoV-2/FLU/RSV plus assay is intended as an aid in the diagnosis of influenza from Nasopharyngeal swab specimens and should not be used as a sole basis for treatment. Nasal washings and aspirates are unacceptable for Xpert Xpress SARS-CoV-2/FLU/RSV testing.  Fact Sheet for Patients: EntrepreneurPulse.com.au  Fact Sheet for Healthcare Providers: IncredibleEmployment.be  This test is not yet approved or cleared by the [place] FDA and has been authorized for detection and/or diagnosis of SARS-CoV-2 by FDA under an Emergency Use Authorization (EUA). This EUA will remain in effect (meaning this test can be used) for the duration of the COVID-19 declaration under Section 564(b)(1) of the Act, 21 U.S.C. section 360bbb-3(b)(1), unless the authorization is terminated or revoked.  Performed at [facility], [place] [address] [place], [place] [address]     RADIOLOGY STUDIES/RESULTS: No results found.   LOS: 3 days   Oren Binet, MD  [facility]    To contact the attending provider between 7A-7P or the covering provider during after hours 7P-7A, please log into the web site  [email or web address] and access using universal [place] password for that web site. If you do not have the password, please call the hospital operator.  11/21/2021, 12:50 PM

## 2021-11-21 NOTE — Hospital Course (Addendum)
76 year old with history of bipolar disorder, hypothyroidism, HLD, CAD, HTN, bipolar disorder-who was seen in the ED on 1/27-diagnosed with COVID-19 and discharged on Paxlovid, she returned to the ED on 1/29 with vomiting/diarrhea and abdominal pain.  She was subsequently found to have colitis, AKI and admitted to the hospitalist service.  See below for further details.  Significant events: 1/29>> admit to [facility]-COVID/colitis/AKI  Significant imaging studies: 1/29>> CTA abdomen: Colitis extending from rectum to proximal transverse colon 1/29>> CXR: No pneumonia  Pertinent microbiology data: 1/31>> stool C. difficile PCR: Negative 2/2>> GI pathogen panel: negative  Consultation: Gastroenterology

## 2021-11-22 LAB — COMPREHENSIVE METABOLIC PANEL
ALT: 23 U/L (ref 0–44)
AST: 37 U/L (ref 15–41)
Albumin: 2.7 g/dL — ABNORMAL LOW (ref 3.5–5.0)
Alkaline Phosphatase: 50 U/L (ref 38–126)
Anion gap: 8 (ref 5–15)
BUN: 8 mg/dL (ref 8–23)
CO2: 30 mmol/L (ref 22–32)
Calcium: 8.7 mg/dL — ABNORMAL LOW (ref 8.9–10.3)
Chloride: 100 mmol/L (ref 98–111)
Creatinine, Ser: 0.68 mg/dL (ref 0.44–1.00)
GFR, Estimated: >60 mL/min (ref >60)
Glucose, Bld: 85 mg/dL (ref 70–99)
Potassium: 3.1 mmol/L — ABNORMAL LOW (ref 3.5–5.1)
Sodium: 138 mmol/L (ref 135–145)
Total Bilirubin: 0.1 mg/dL — ABNORMAL LOW (ref 0.3–1.2)
Total Protein: 5.3 g/dL — ABNORMAL LOW (ref 6.5–8.1)

## 2021-11-22 LAB — CBC WITH DIFFERENTIAL/PLATELET
Abs Immature Granulocytes: 0.02 10*3/uL (ref 0.00–0.07)
Basophils Absolute: 0 10*3/uL (ref 0.0–0.1)
Basophils Relative: 0 %
Eosinophils Absolute: 0 10*3/uL (ref 0.0–0.5)
Eosinophils Relative: 0 %
HCT: 34.9 % — ABNORMAL LOW (ref 36.0–46.0)
Hemoglobin: 11.4 g/dL — ABNORMAL LOW (ref 12.0–15.0)
Immature Granulocytes: 0 %
Lymphocytes Relative: 28 %
Lymphs Abs: 2.3 10*3/uL (ref 0.7–4.0)
MCH: 29 pg (ref 26.0–34.0)
MCHC: 32.7 g/dL (ref 30.0–36.0)
MCV: 88.8 fL (ref 80.0–100.0)
Monocytes Absolute: 1.3 10*3/uL — ABNORMAL HIGH (ref 0.1–1.0)
Monocytes Relative: 16 %
Neutro Abs: 4.5 10*3/uL (ref 1.7–7.7)
Neutrophils Relative %: 56 %
Platelets: 145 10*3/uL — ABNORMAL LOW (ref 150–400)
RBC: 3.93 MIL/uL (ref 3.87–5.11)
RDW: 12.9 % (ref 11.5–15.5)
WBC: 8.1 10*3/uL (ref 4.0–10.5)
nRBC: 0 % (ref 0.0–0.2)

## 2021-11-22 MED ORDER — POTASSIUM CHLORIDE CRYS ER 20 MEQ PO TBCR
40.0000 meq | EXTENDED_RELEASE_TABLET | ORAL | Status: AC
  Administered 2021-11-22 (×2): 40 meq via ORAL
  Filled 2021-11-22 (×2): qty 2

## 2021-11-22 MED ORDER — ONDANSETRON HCL 4 MG PO TABS
4.0000 mg | ORAL_TABLET | Freq: Three times a day (TID) | ORAL | Status: DC
  Administered 2021-11-23: 4 mg via ORAL
  Filled 2021-11-22: qty 1

## 2021-11-22 MED ORDER — ONDANSETRON HCL 4 MG/2ML IJ SOLN
4.0000 mg | Freq: Three times a day (TID) | INTRAMUSCULAR | Status: DC
  Administered 2021-11-22 – 2021-11-24 (×4): 4 mg via INTRAVENOUS
  Filled 2021-11-22 (×4): qty 2

## 2021-11-22 MED ORDER — PANTOPRAZOLE SODIUM 40 MG PO TBEC
40.0000 mg | DELAYED_RELEASE_TABLET | Freq: Two times a day (BID) | ORAL | Status: DC
  Administered 2021-11-23 – 2021-11-24 (×3): 40 mg via ORAL
  Filled 2021-11-22 (×3): qty 1

## 2021-11-22 NOTE — Progress Notes (Signed)
Physical Therapy Treatment Patient Details Name: Courtney [name] MRN: [id number] DOB: 1946-09-21 Today's Date: 11/22/2021   History of Present Illness 76 y.o. female presenting 11/18/21 with SOB.  She tested positive for COVID on 1/27 and started Paxlovid. Developed N/V/D; colitis;    PMH significant for bipolar disorder, HTN, mitral valve prolapse, claustrophobia, CVA, vertigo, PE, prior back surgery, B knee replacements.    PT Comments    Patient generally fatigued from being OOB in chair all morning. Resting in bed on arrival; agreeable to exercises and ambulation. Continues to move well with minguard assist, however was fatigued after 50 ft. Discussed ?ability to return to her apartment at [facility] with HHPT vs go to healthcare unit to get more therapy. Patient still strongly believes she can manage at home and does not want to go to healthcare unit. Encouraged her to continue to mobilize as much as possible while inpatient.     Recommendations for follow up therapy are one component of a multi-disciplinary discharge planning process, led by the attending physician.  Recommendations may be updated based on patient status, additional functional criteria and insurance authorization.  Follow Up Recommendations  Home health PT     Assistance Recommended at Discharge Intermittent Supervision/Assistance  Patient can return home with the following A little help with walking and/or transfers;Assistance with cooking/housework;Assistance with feeding   Equipment Recommendations  None recommended by PT    Recommendations for Other Services       Precautions / Restrictions Precautions Precautions: Fall     Mobility  Bed Mobility Overal bed mobility: Needs Assistance Bed Mobility: Supine to Sit     Supine to sit: Min guard, HOB elevated Sit to supine: Supervision   General bed mobility comments: pt using rail for OOB    Transfers Overall transfer level: Needs assistance Equipment  used: Rollator (4 wheels) Transfers: Sit to/from Stand Sit to Stand: Min guard   Step pivot transfers: Min guard       General transfer comment: step pivot bed to [facility] with no device; from [facility] with rollator    Ambulation/Gait Ambulation/Gait assistance: Min guard Gait Distance (Feet): 50 Feet Assistive device: Rollator (4 wheels) Gait Pattern/deviations: Step-through pattern, Decreased stride length, Trunk flexed   Gait velocity interpretation: <1.8 ft/sec, indicate of risk for recurrent falls   General Gait Details: short step length, but good foot clearance; ran into one leg of chair x 1 with rollator, no LOB and able to self-correct   Stairs             Wheelchair Mobility    Modified Rankin (Stroke Patients Only)       Balance Overall balance assessment: Needs assistance Sitting-balance support: No upper extremity supported, Feet supported Sitting balance-Leahy Scale: Good     Standing balance support: Bilateral upper extremity supported, Reliant on assistive device for balance Standing balance-Leahy Scale: Poor                              Cognition Arousal/Alertness: Awake/alert Behavior During Therapy: WFL for tasks assessed/performed Overall Cognitive Status: Within Functional Limits for tasks assessed                                          Exercises General Exercises - Lower Extremity Ankle Circles/Pumps: AROM, Both, 10 reps Quad Sets: AROM, Both, 10 reps  Heel Slides: AROM, Both, 10 reps    General Comments General comments (skin integrity, edema, etc.): Discussed discharge plan      Pertinent Vitals/Pain Pain Assessment Pain Assessment: No/denies pain    Home Living                          Prior Function            PT Goals (current goals can now be found in the care plan section) Acute Rehab PT Goals Patient Stated Goal: be strong enough to go straight home PT Goal Formulation: With  patient Time For Goal Achievement: 12/03/21 Potential to Achieve Goals: Good Progress towards PT goals: Progressing toward goals    Frequency    Min 3X/week      PT Plan Current plan remains appropriate    Co-evaluation              AM-PAC PT "6 Clicks" Mobility   Outcome Measure  Help needed turning from your back to your side while in a flat bed without using bedrails?: A Little Help needed moving from lying on your back to sitting on the side of a flat bed without using bedrails?: A Little Help needed moving to and from a bed to a chair (including a wheelchair)?: A Little Help needed standing up from a chair using your arms (e.g., wheelchair or bedside chair)?: A Little Help needed to walk in hospital room?: A Little Help needed climbing 3-5 steps with a railing? : Total 6 Click Score: 16    End of Session   Activity Tolerance: Patient limited by fatigue Patient left: with call bell/phone within reach;in bed Nurse Communication: Mobility status PT Visit Diagnosis: Unsteadiness on feet (R26.81);Muscle weakness (generalized) (M62.81)     Time: [id number] PT Time Calculation (min) (ACUTE ONLY): 32 min  Charges:  $Gait Training: 8-22 mins $Therapeutic Exercise: 8-22 mins                      Arby Barrette, PT Acute Rehabilitation Services  Pager [phone] Office [phone]    Rexanne Mano 11/22/2021, 3:46 PM

## 2021-11-22 NOTE — Progress Notes (Signed)
PROGRESS NOTE        PATIENT DETAILS Name: Courtney [name] Age: 76 y.o. Sex: female Date of Birth: [birth year] Admit Date: 11/18/2021 Admitting Physician Karmen Bongo, MD [id number], [place]enton Ar, MD  Brief Summary: 76 year old with history of bipolar disorder, hypothyroidism, HLD, CAD, HTN, bipolar disorder-who was seen in the ED on 1/27-diagnosed with COVID-19 and discharged on Paxlovid, she returned to the ED on 1/29 with vomiting/diarrhea and abdominal pain.  She was subsequently found to have colitis, AKI and admitted to the hospitalist service.  See below for further details.  Significant events: 1/29>> admit to [facility]-COVID/colitis/AKI  Significant imaging studies: 1/29>> CTA abdomen: Colitis extending from rectum to proximal transverse colon 1/29>> CXR: No pneumonia  Pertinent microbiology data: 1/31>> stool C. difficile PCR: Negative   Subjective: Still with diarrhea-occasionally bloody.  Mild LLQ pain.  Appears comfortable.  Objective: Vitals: Blood pressure (!) 150/86, pulse 69, temperature 98 F (36.7 C), temperature source Oral, resp. rate 19, height 5' 2.5" (1.588 m), weight 93 kg, SpO2 96 %.   Exam: Gen Exam:Alert awake-not in any distress HEENT:atraumatic, normocephalic Chest: B/L clear to auscultation anteriorly CVS:S1S2 regular Abdomen: Soft-minimal LLQ pain-no peritoneal signs. Extremities:no edema Neurology: Non focal Skin: no rash   Pertinent Labs/Radiology: CBC Latest Ref Rng & Units 11/22/2021 11/21/2021 11/20/2021  WBC 4.0 - 10.5 K/uL 8.1 11.9(H) 9.8  Hemoglobin 12.0 - 15.0 g/dL 11.4(L) 10.8(L) 11.5(L)  Hematocrit 36.0 - 46.0 % 34.9(L) 34.0(L) 35.2(L)  Platelets 150 - 400 K/uL 145(L) 151 149(L)    Lab Results  Component Value Date   NA 138 11/22/2021   K 3.1 (L) 11/22/2021   CL 100 11/22/2021   CO2 30 11/22/2021      Assessment/Plan: * COVID-19 infection with colitis- (present on admission) Continues to have loose stools  that are occasionally bloody, unclear whether it is from [place] or she has a bacterial infection/inflammation/ischemic colitis.  C. difficile PCR was negative, GI pathogen panel pending.  Per patient-her last colonoscopy was several years ago and done in [place]-but she was told she did not have any major abnormalities.  Continue supportive care-continue Remdesivir/Rocephin/Flagyl.  I have consulted gastroenterology today.    AKI (acute kidney injury) ([place])- (present on admission) Hemodynamically mediated-resolved with supportive care.  Hypokalemia- (present on admission) Due to GI loss-replete and recheck.  CAD (coronary artery disease)- (present on admission) Seen incidentally on CT angio abdomen-no anginal symptoms.  Continue aspirin/statin-we will need cardiology evaluation in the outpatient setting when she has recovered from acute illness.  OSA (obstructive sleep apnea)- (present on admission) Continue with CPAP  Dyslipidemia- (present on admission) Continue high-dose Lipitor  Hypothyroidism (acquired)- (present on admission) Continue Synthroid  Hypertension- (present on admission) BP slowly creeping up-continue verapamil-reassess and add other agents as appropriate over the next few days.  Bipolar disorder ([place])- (present on admission) -Continue home meds - Wellbutrin, Lexapro, Ativan  Obesity: Estimated body mass index is 36.9 kg/m as calculated from the following:   Height as of this encounter: 5' 2.5" (1.588 m).   Weight as of this encounter: 93 kg.   DVT Prophylaxis: SQ Lovenox Procedures: None Consults: None Code Status:Full code  Family Communication: Spouse-[phone]-updated over the phone on 2/2.   Disposition Plan: Status is: Inpatient Remains inpatient appropriate because: COVID-19-resolving diarrhea-on IV antimicrobial therapy.  Planned Discharge Destination: Home   Diet: Diet Order  Diet clear liquid Room service appropriate?  No; Fluid consistency: Thin  Diet effective now                     Antimicrobial agents: Anti-infectives (From admission, onward)    Start     Dose/Rate Route Frequency Ordered Stop   11/19/21 1000  remdesivir 100 mg in sodium chloride 0.9 % 100 mL IVPB       See Hyperspace for full Linked Orders Report.   100 mg 200 mL/hr over 30 Minutes Intravenous Daily 11/18/21 0932 11/23/21 0959   11/19/21 0800  cefTRIAXone (ROCEPHIN) 2 g in sodium chloride 0.9 % 100 mL IVPB        2 g 200 mL/hr over 30 Minutes Intravenous Every 24 hours 11/18/21 0932     11/18/21 0945  remdesivir 200 mg in sodium chloride 0.9% 250 mL IVPB       See Hyperspace for full Linked Orders Report.   200 mg 580 mL/hr over 30 Minutes Intravenous Once 11/18/21 0932 11/18/21 1203   11/18/21 0830  cefTRIAXone (ROCEPHIN) 2 g in sodium chloride 0.9 % 100 mL IVPB        2 g 200 mL/hr over 30 Minutes Intravenous  Once 11/18/21 0829 11/18/21 0937   11/18/21 0830  metroNIDAZOLE (FLAGYL) IVPB 500 mg        500 mg 100 mL/hr over 60 Minutes Intravenous Every 12 hours 11/18/21 0829          MEDICATIONS: Scheduled Meds:  vitamin C  500 mg Oral Daily   aspirin  325 mg Oral Daily   atorvastatin  80 mg Oral Daily   buPROPion  150 mg Oral Daily   chlorhexidine  15 mL Mouth Rinse BID   enoxaparin (LOVENOX) injection  40 mg Subcutaneous Q24H   escitalopram  10 mg Oral Daily   fluticasone  2 spray Each Nare BID   fluticasone  2 puff Inhalation BID   gabapentin  100 mg Oral TID   levothyroxine  50 mcg Oral QAC breakfast   LORazepam  1 mg Oral QHS   mouth rinse  15 mL Mouth Rinse q12n4p   melatonin  5 mg Oral QHS   mirabegron ER  25 mg Oral Daily   montelukast  10 mg Oral Daily   pantoprazole  40 mg Oral Daily   sodium chloride flush  3 mL Intravenous Q12H   sodium chloride flush  3 mL Intravenous Q12H   verapamil  360 mg Oral Daily   zinc sulfate  220 mg Oral Daily   Continuous Infusions:  sodium chloride      cefTRIAXone (ROCEPHIN)  IV 2 g (11/22/21 0907)   metronidazole 500 mg (11/22/21 0912)   promethazine (PHENERGAN) injection (IM or IVPB)     remdesivir 100 mg in NS 100 mL 100 mg (11/22/21 1225)   PRN Meds:.sodium chloride, acetaminophen, albuterol, chlorpheniramine-HYDROcodone, cyclobenzaprine, guaiFENesin-dextromethorphan, LORazepam, ondansetron **OR** ondansetron (ZOFRAN) IV, oxyCODONE, promethazine (PHENERGAN) injection (IM or IVPB), sodium chloride flush   I have personally reviewed following labs and imaging studies  LABORATORY DATA: CBC: Recent Labs  Lab 11/18/21 0555 11/19/21 0701 11/20/21 0424 11/21/21 0318 11/22/21 0118  WBC 14.6* 10.3 9.8 11.9* 8.1  NEUTROABS 12.6* 9.2* 8.3* 10.3* 4.5  HGB 13.0 12.5 11.5* 10.8* 11.4*  HCT 41.1 38.4 35.2* 34.0* 34.9*  MCV 92.8 90.6 89.1 89.2 88.8  PLT 182 150 149* 151 145*    Basic Metabolic Panel: Recent Labs  Lab 11/18/21 [phone]  11/19/21 0701 11/20/21 0424 11/21/21 0318 11/22/21 0118  NA 134* 137 137 136 138  K 3.8 3.7 3.3* 3.6 3.1*  CL 100 102 101 101 100  CO2 27 28 27 28 30   GLUCOSE 123* 128* 132* 119* 85  BUN 19 11 13 10 8   CREATININE 1.39* 0.67 0.63 0.66 0.68  CALCIUM 8.0* 8.7* 8.8* 8.9 8.7*  MG  --  1.7 1.7  --   --   PHOS  --  3.1 2.6  --   --     GFR: Estimated Creatinine Clearance: 67.2 mL/min (by C-G formula based on SCr of 0.68 mg/dL).  Liver Function Tests: Recent Labs  Lab 11/18/21 0619 11/19/21 0701 11/20/21 0424 11/21/21 0318 11/22/21 0118  AST 23 22 22 30  37  ALT 5 16 16 20 23   ALKPHOS 75 65 57 59 50  BILITOT 0.4 0.5 0.2* 0.4 0.1*  PROT 5.7* 5.6* 5.5* 5.2* 5.3*  ALBUMIN 3.0* 2.7* 2.7* 2.7* 2.7*   Recent Labs  Lab 11/18/21 0619  LIPASE 22   No results for input(s): AMMONIA in the last 168 hours.  Coagulation Profile: No results for input(s): INR, PROTIME in the last 168 hours.  Cardiac Enzymes: No results for input(s): CKTOTAL, CKMB, CKMBINDEX, TROPONINI in the last 168 hours.  BNP  (last 3 results) No results for input(s): PROBNP in the last 8760 hours.  Lipid Profile: No results for input(s): CHOL, HDL, LDLCALC, TRIG, CHOLHDL, LDLDIRECT in the last 72 hours.  Thyroid Function Tests: No results for input(s): TSH, T4TOTAL, FREET4, T3FREE, THYROIDAB in the last 72 hours.  Anemia Panel: Recent Labs    11/20/21 0424  FERRITIN 186    Urine analysis:    Component Value Date/Time   COLORURINE YELLOW 11/16/2021 0828   APPEARANCEUR CLEAR 11/16/2021 0828   LABSPEC 1.008 11/16/2021 0828   PHURINE 5.0 11/16/2021 0828   GLUCOSEU NEGATIVE 11/16/2021 0828   HGBUR NEGATIVE 11/16/2021 0828   BILIRUBINUR NEGATIVE 11/16/2021 0828   KETONESUR NEGATIVE 11/16/2021 0828   PROTEINUR NEGATIVE 11/16/2021 0828   NITRITE NEGATIVE 11/16/2021 0828   LEUKOCYTESUR MODERATE (A) 11/16/2021 0828    Sepsis Labs: Lactic Acid, Venous    Component Value Date/Time   LATICACIDVEN 2.0 ([place]) 11/18/2021 0750    MICROBIOLOGY: Recent Results (from the past 240 hour(s))  C Difficile Quick Screen w PCR reflex     Status: None   Collection Time: 11/20/21 12:05 AM   Specimen: STOOL  Result Value Ref Range Status   C Diff antigen NEGATIVE NEGATIVE Final   C Diff toxin NEGATIVE NEGATIVE Final   C Diff interpretation No C. difficile detected.  Final    Comment: Performed at [facility], [place] [address] [place], [place] [address]  Resp Panel by RT-PCR (Flu A&B, Covid) Nasopharyngeal Swab     Status: Abnormal   Collection Time: 11/20/21  7:15 AM   Specimen: Nasopharyngeal Swab; Nasopharyngeal(NP) swabs in vial transport medium  Result Value Ref Range Status   SARS Coronavirus 2 by RT PCR POSITIVE (A) NEGATIVE Final    Comment: (NOTE) SARS-CoV-2 target nucleic acids are DETECTED.  The SARS-CoV-2 RNA is generally detectable in upper respiratory specimens during the acute phase of infection. Positive results are indicative of the presence of the identified virus, but do not rule out  bacterial infection or co-infection with other pathogens not detected by the test. Clinical correlation with patient history and other diagnostic information is necessary to determine patient infection status. The expected result  is Negative.  Fact Sheet for Patients: EntrepreneurPulse.com.au  Fact Sheet for Healthcare Providers: IncredibleEmployment.be  This test is not yet approved or cleared by the [place] FDA and  has been authorized for detection and/or diagnosis of SARS-CoV-2 by FDA under an Emergency Use Authorization (EUA).  This EUA will remain in effect (meaning this test can be used) for the duration of  the COVID-19 declaration under Section 564(b)(1) of the A ct, 21 U.S.C. section 360bbb-3(b)(1), unless the authorization is terminated or revoked sooner.     Influenza A by PCR NEGATIVE NEGATIVE Final   Influenza B by PCR NEGATIVE NEGATIVE Final    Comment: (NOTE) The Xpert Xpress SARS-CoV-2/FLU/RSV plus assay is intended as an aid in the diagnosis of influenza from Nasopharyngeal swab specimens and should not be used as a sole basis for treatment. Nasal washings and aspirates are unacceptable for Xpert Xpress SARS-CoV-2/FLU/RSV testing.  Fact Sheet for Patients: EntrepreneurPulse.com.au  Fact Sheet for Healthcare Providers: IncredibleEmployment.be  This test is not yet approved or cleared by the [place] FDA and has been authorized for detection and/or diagnosis of SARS-CoV-2 by FDA under an Emergency Use Authorization (EUA). This EUA will remain in effect (meaning this test can be used) for the duration of the COVID-19 declaration under Section 564(b)(1) of the Act, 21 U.S.C. section 360bbb-3(b)(1), unless the authorization is terminated or revoked.  Performed at [facility], [place] [address] [place], [place] [address]     RADIOLOGY STUDIES/RESULTS: VAS [place] LOWER  EXTREMITY VENOUS (DVT)  Result Date: 11/21/2021  Lower Venous DVT Study Patient Name:  ARLEY PORATH  Date of Exam:   11/21/2021 Medical Rec #: [id number]        Accession #:    [id number] Date of Birth: 1946/03/20        Patient Gender: F Patient Age:   70 years Exam Location:  [facility] Procedure:      VAS [place] LOWER EXTREMITY VENOUS (DVT) Referring Phys: Phillips Climes --------------------------------------------------------------------------------  Indications: Elevated d-dimer, Covid-19, remote history of PE.  Comparison Study: No prior studies. Performing Technologist: Darlin Coco RDMS, RVT  Examination Guidelines: A complete evaluation includes B-mode imaging, spectral Doppler, color Doppler, and power Doppler as needed of all accessible portions of each vessel. Bilateral testing is considered an integral part of a complete examination. Limited examinations for reoccurring indications may be performed as noted. The reflux portion of the exam is performed with the patient in reverse Trendelenburg.  +---------+---------------+---------+-----------+----------+--------------+  RIGHT     Compressibility Phasicity Spontaneity Properties Thrombus Aging  +---------+---------------+---------+-----------+----------+--------------+  CFV       Full            Yes       Yes                                    +---------+---------------+---------+-----------+----------+--------------+  SFJ       Full                                                             +---------+---------------+---------+-----------+----------+--------------+  FV Prox   Full                                                             +---------+---------------+---------+-----------+----------+--------------+  FV Mid    Full                                                             +---------+---------------+---------+-----------+----------+--------------+  FV Distal Full                                                              +---------+---------------+---------+-----------+----------+--------------+  PFV       Full                                                             +---------+---------------+---------+-----------+----------+--------------+  POP       Full            Yes       Yes                                    +---------+---------------+---------+-----------+----------+--------------+  PTV       Full                                                             +---------+---------------+---------+-----------+----------+--------------+  PERO      Full                                                             +---------+---------------+---------+-----------+----------+--------------+   +---------+---------------+---------+-----------+----------+--------------+  LEFT      Compressibility Phasicity Spontaneity Properties Thrombus Aging  +---------+---------------+---------+-----------+----------+--------------+  CFV       Full            Yes       Yes                                    +---------+---------------+---------+-----------+----------+--------------+  SFJ       Full                                                             +---------+---------------+---------+-----------+----------+--------------+  FV Prox   Full                                                             +---------+---------------+---------+-----------+----------+--------------+  FV Mid    Full                                                             +---------+---------------+---------+-----------+----------+--------------+  FV Distal Full                                                             +---------+---------------+---------+-----------+----------+--------------+  PFV       Full                                                             +---------+---------------+---------+-----------+----------+--------------+  POP       Full            Yes       Yes                                     +---------+---------------+---------+-----------+----------+--------------+  PTV       Full                                                             +---------+---------------+---------+-----------+----------+--------------+  PERO      Full                                                             +---------+---------------+---------+-----------+----------+--------------+     Summary: RIGHT: - There is no evidence of deep vein thrombosis in the lower extremity.  - No cystic structure found in the popliteal fossa.  LEFT: - There is no evidence of deep vein thrombosis in the lower extremity.  - No cystic structure found in the popliteal fossa.  *See table(s) above for measurements and observations.    Preliminary      LOS: 4 days   Oren Binet, MD  [facility]    To contact the attending provider between 7A-7P or the covering provider during after hours 7P-7A, please log into the web site [email or web address] and access using universal [place] password for that web site. If you do not have the password, please call the hospital operator.  11/22/2021, 12:28 PM

## 2021-11-22 NOTE — Consult Note (Signed)
Consultation  Referring Provider:   Dr. Sloan Leiter Primary Care Physician:  Wenda Low, MD Primary Gastroenterologist:  [facility]       Reason for Consultation:     Colitis   Impression     COVID-19 infection  On remdesivir/Rocephin, Flagyl Chest x-ray normal  Colitis rectum to mid transverse colon WBC 8.1 HGB 11.4 MCV 88.8 Platelets 145 BUN 8 Cr 0.68   Last colonoscopy 2017 recall 10 years C. difficile negative Pending GI pathogen panel  GERD Last endoscopy 2017, 3 cm hiatal hernia, no evidence of bleeding at this time. Does have reflux.  CAD  AKI with hyperkalemia Continue supportive care    Plan   Send for GI pathogen panels, stool culture -C. Difficile PCR negative -If stool studies negative question COVID colitis versus ischemic-CTA negative, can continue supportive care with IV fluids, antibiotics. -Will change Zofran to scheduled every 8 hours as this will help with patient's nausea as well as can potentially slow down diarrhea. -Patient has known history of reflux and 3 cm hiatal hernia, will increase Protonix to twice daily as C. difficile is negative. -Encouraged to do small frequent meals --Can consider trial of bile acid sequestrant with patient's history of cholecystectomy if continuing worsening diarrhea -Can follow-up outpatient with her primary gastroenterologist for consideration of colonoscopy at that time.  Thank you for your kind consultation, we will continue to follow.         HPI:   Courtney [name] is a 77 y.o. female with past medical history significant for hypothyroidism, hypertension, history of kidney stones, mitral valve prolapse, sleep apnea, history of stroke with subsequent vertigo, bipolar mood disorder, diverticulitis, history of PE not currently on anticoagulation. History of tonsillectomy, cholecystectomy, abdominal hysterectomy, anterior cervical discectomy fusion 03/2021. Patient presented to the ER 11/18/2021 for  worsening COVID-pneumonia. Patient that time was having nausea, hypoxia with oxygen saturation 75%, nausea, vomiting, diarrhea with grossly bloody stool in the ER. CTA scan 01/29 showed colitis extending from rectum to proximal transverse colon. This x-ray showed no pneumonia. Patient C. difficile negative by PCR 01/31. Patient was started on steroids, remdesivir and antibiotics at that time.  Patient states was in normal state of health 1 week ago.  Normally has formed stools every other day without straining.  Has never had GI bleeding prior.  Patient has longstanding history of reflux and has had some GERD here.  Patient's had nausea without vomiting.  States worse with potassium pills and food.  Has been on liquid diet since being here.  States Zofran does help some, getting as needed. Patient states having severe fatigue just wants to sleep all the time since being diagnosed with COVID.  Continues to have some shortness of breath, no chest pain, fever, chills.  Had normal chest x-ray.  Since a week ago with COVID diagnosis began to have lower abdominal cramping with a [place] to large "blowout" stools per day liquid not many solid pieces with bright red blood in the commode Patient lives at [place] with her husband, bland diet, no abnormal foods. No camping, no traveling, no other sick contacts.   Previous GI work up: Last office visit 07/02/2018 with [facility] February 2017 patient had normal colonoscopy, recall 10 years.  No family history of colon cancer, colon polyps or inflammatory bowel disease. Upper endoscopy 2017 showed 3 cm hiatal hernia, no Cameron lesions, esophagus, stomach, duodenum normal, negative for H. pylori or Barrett's esophagus.   Past Medical  History:  Diagnosis Date   Anemia    Arthritis    Bilateral Wrists   Asthma    Bipolar disorder ([place])    Diverticulitis    Hiatal hernia    History of kidney stones 2006   Hypertension    Hypothyroidism (acquired)  04/02/2021   Kidney stone    Mitral valve prolapse    OSA (obstructive sleep apnea) 04/02/2021   Panic attack    PONV (postoperative nausea and vomiting)    Pulmonary embolus (HCC)    Stroke [facility])    Vertigo     Surgical History:  She  has a past surgical history that includes Tonsillectomy; Cholecystectomy; Abdominal hysterectomy; Back surgery; Knee surgery; Breast reduction surgery; Toe Surgery; Knee surgery; Eye surgery (Bilateral, 2000); Joint replacement (Bilateral); and Anterior cervical decomp/discectomy fusion (N/A, 03/28/2021). Family History:  Her family history is not on file. Social History:   reports that she has never smoked. She has never used smokeless tobacco. She reports that she does not currently use alcohol. She reports that she does not currently use drugs.  Prior to Admission medications   Medication Sig Start Date End Date Taking? Authorizing Provider  acetaminophen (TYLENOL) 325 MG tablet Take 650 mg by mouth every 6 (six) hours as needed for moderate pain.   Yes [provider]  albuterol (VENTOLIN HFA) 108 (90 Base) MCG/ACT inhaler Inhale 1 puff into the lungs every 6 (six) hours as needed for wheezing or shortness of breath. 02/22/21  Yes [provider]  aspirin 325 MG tablet Take 325 mg by mouth daily.   Yes [provider]  atorvastatin (LIPITOR) 80 MG tablet Take 80 mg by mouth daily. 02/21/21  Yes [provider]  Biotin 5000 MCG CAPS Take 5,000 mcg by mouth daily.   Yes [provider]  buPROPion (WELLBUTRIN XL) 150 MG 24 hr tablet Take 150 mg by mouth daily. 05/27/16  Yes [provider]  Cholecalciferol (VITAMIN D3) 25 MCG (1000 UT) CAPS Take 1,000 Units by mouth daily.   Yes [provider]  cyanocobalamin (,VITAMIN B-12,) 1000 MCG/ML injection Inject 1,000 mcg into the muscle every 30 (thirty) days. 02/23/21  Yes [provider]  cyclobenzaprine (FLEXERIL) 10 MG tablet Take 1 tablet (10 mg  total) by mouth 3 (three) times daily as needed for muscle spasms. 04/04/21  Yes Viona Gilmore D, NP  diclofenac Sodium (VOLTAREN) 1 % GEL Apply 2 g topically daily. 12/02/20  Yes [provider]  docusate sodium (COLACE) 100 MG capsule Take 200 mg by mouth daily.   Yes [provider]  escitalopram (LEXAPRO) 10 MG tablet Take 10 mg by mouth daily. 11/24/20  Yes [provider]  FLOVENT HFA 44 MCG/ACT inhaler Inhale 2 puffs into the lungs in the morning and at bedtime. 02/22/21  Yes [provider]  fluticasone (FLONASE) 50 MCG/ACT nasal spray Place 2 sprays into both nostrils in the morning and at bedtime. 10/29/21  Yes [provider]  gabapentin (NEURONTIN) 100 MG capsule Take 100 mg by mouth in the morning, at noon, and at bedtime. 02/13/21  Yes [provider]  levothyroxine (SYNTHROID) 50 MCG tablet Take 50 mcg by mouth daily before breakfast. 02/09/21  Yes [provider]  LORazepam (ATIVAN) 1 MG tablet Take 1 mg by mouth daily. 11/06/21  Yes [provider]  magnesium oxide (MAG-OX) 400 MG tablet Take 400 mg by mouth daily.   Yes [provider]  meclizine (ANTIVERT) 25 MG  tablet Take 25 mg by mouth 3 (three) times daily as needed for dizziness.   Yes [provider]  melatonin 5 MG TABS Take 5 mg by mouth at bedtime.   Yes [provider]  montelukast (SINGULAIR) 10 MG tablet Take 10 mg by mouth daily. 02/13/21  Yes [provider]  multivitamin-lutein (OCUVITE-LUTEIN) CAPS capsule Take 1 capsule by mouth daily.   Yes [provider]  olmesartan (BENICAR) 40 MG tablet Take 40 mg by mouth daily. 02/21/21  Yes [provider]  ondansetron (ZOFRAN) 4 MG tablet Take 4 mg by mouth every 8 (eight) hours as needed for nausea or vomiting.   Yes [provider]  OZEMPIC, 0.25 OR 0.5 MG/DOSE, 2 MG/1.5ML SOPN Inject 0.25 mg into the skin every Tuesday. 02/27/21  Yes [provider]  pantoprazole (PROTONIX) 40 MG tablet Take 40 mg by mouth daily. 02/13/21  Yes [provider]  psyllium (REGULOID) 0.52 g capsule Take 1.04 g by mouth daily.   Yes [provider]  verapamil (CALAN-SR) 120 MG CR tablet Take 360 mg by mouth daily. 02/13/21  Yes [provider]  Vibegron (GEMTESA) 75 MG TABS Take 75 mg by mouth at bedtime.   Yes [provider]    Current Facility-Administered Medications  Medication Dose Route Frequency Provider Last Rate Last Admin   0.9 %  sodium chloride infusion  250 mL Intravenous PRN Karmen Bongo, MD       acetaminophen (TYLENOL) tablet 650 mg  650 mg Oral Q6H PRN Karmen Bongo, MD   650 mg at 11/21/21 0244   albuterol (VENTOLIN HFA) 108 (90 Base) MCG/ACT inhaler 2 puff  2 puff Inhalation Q2H PRN Karmen Bongo, MD   2 puff at 11/21/21 0815   ascorbic acid (VITAMIN C) tablet 500 mg  500 mg Oral Daily Karmen Bongo, MD   500 mg at 11/22/21 1045   aspirin tablet 325 mg  325 mg Oral Daily Karmen Bongo, MD   325 mg at 11/22/21 1045   atorvastatin (LIPITOR) tablet 80 mg  80 mg Oral Daily Karmen Bongo, MD   80 mg at 11/22/21 1046   buPROPion (WELLBUTRIN XL) 24 hr tablet 150 mg  150 mg Oral Daily Karmen Bongo, MD   150 mg at 11/22/21 1045   cefTRIAXone (ROCEPHIN) 2 g in sodium chloride 0.9 % 100 mL IVPB  2 g Intravenous Q24H Karmen Bongo, MD 200 mL/hr at 11/22/21 0907 2 g at 11/22/21 0907   chlorhexidine (PERIDEX) 0.12 % solution 15 mL  15 mL Mouth Rinse BID Elgergawy, Silver Huguenin, MD   15 mL at 11/22/21 1045   chlorpheniramine-HYDROcodone 10-8 MG/5ML suspension 5 mL  5 mL Oral Q12H PRN Karmen Bongo, MD   5 mL at 11/21/21 2326   cyclobenzaprine (FLEXERIL) tablet 10 mg  10 mg Oral TID PRN Karmen Bongo, MD       enoxaparin (LOVENOX) injection 40 mg  40 mg Subcutaneous Q24H Karmen Bongo, MD   40 mg at 11/22/21 0908   escitalopram (LEXAPRO) tablet 10 mg  10 mg Oral Daily Karmen Bongo, MD    10 mg at 11/22/21 1045   fluticasone (FLONASE) 50 MCG/ACT nasal spray 2 spray  2 spray Each Nare BID Karmen Bongo, MD   2 spray at 11/22/21 1046   fluticasone (FLOVENT HFA) 44 MCG/ACT inhaler 2 puff  2 puff Inhalation BID Karmen Bongo, MD   2 puff at 11/21/21 2230   gabapentin (NEURONTIN) capsule 100 mg  100 mg Oral TID Karmen Bongo, MD   100 mg at 11/22/21 1045   guaiFENesin-dextromethorphan (ROBITUSSIN DM) 100-10 MG/5ML syrup 10 mL  10 mL Oral Q4H PRN Karmen Bongo, MD   10 mL at 11/21/21 1255   levothyroxine (SYNTHROID) tablet 50 mcg  50 mcg Oral QAC breakfast Karmen Bongo, MD   50 mcg at 11/22/21 0533   LORazepam (ATIVAN) injection 0.5 mg  0.5 mg Intravenous Q4H PRN Karmen Bongo, MD   0.5 mg at 11/20/21 1512   LORazepam (ATIVAN) tablet 1 mg  1 mg Oral QHS Chotiner, Yevonne Aline, MD   1 mg at 11/21/21 2208   MEDLINE mouth rinse  15 mL Mouth Rinse q12n4p Elgergawy, Silver Huguenin, MD   15 mL at 11/21/21 1611   melatonin tablet 5 mg  5 mg Oral Ivery Quale, MD   5 mg at 11/21/21 2208   metroNIDAZOLE (FLAGYL) IVPB 500 mg  500 mg Intravenous Lillia Mountain, MD 100 mL/hr at 11/22/21 0912 500 mg at 11/22/21 0912   mirabegron ER (MYRBETRIQ) tablet 25 mg  25 mg Oral Daily Karmen Bongo, MD   25 mg at 11/22/21 1045   montelukast (SINGULAIR) tablet 10 mg  10 mg Oral Daily Karmen Bongo, MD   10 mg at 11/22/21 1046   ondansetron (ZOFRAN) tablet 4 mg  4 mg Oral Q6H PRN Karmen Bongo, MD   4 mg at 11/21/21 1204   Or   ondansetron (ZOFRAN) injection 4 mg  4 mg Intravenous Q6H PRN Karmen Bongo, MD   4 mg at 11/22/21 1055   oxyCODONE (Oxy IR/ROXICODONE) immediate release tablet 5 mg  5 mg Oral Q4H PRN Karmen Bongo, MD       pantoprazole (PROTONIX) EC tablet 40 mg  40 mg Oral Daily Karmen Bongo, MD   40 mg at 11/22/21 1046   potassium chloride SA (KLOR-CON M) CR tablet 40 mEq  40 mEq Oral Q4H Ghimire, Henreitta Leber, MD   40 mEq at 11/22/21 0912   promethazine (PHENERGAN) 12.5 mg  in sodium chloride 0.9 % 50 mL IVPB  12.5 mg Intravenous Q6H PRN Karmen Bongo, MD       remdesivir 100 mg in sodium chloride 0.9 % 100 mL IVPB  100 mg Intravenous Daily Karmen Bongo, MD   Stopped at 11/21/21 1540   sodium chloride flush (NS) 0.9 % injection 3 mL  3 mL Intravenous Lillia Mountain, MD   3 mL at 11/22/21 1047   sodium chloride flush (NS) 0.9 % injection 3 mL  3 mL Intravenous Lillia Mountain, MD   3 mL at 11/22/21 1047   sodium chloride flush (NS) 0.9 % injection 3 mL  3 mL Intravenous PRN Karmen Bongo, MD   3 mL at 11/18/21 2238   verapamil (CALAN-SR) CR tablet 360 mg  360 mg Oral Daily Karmen Bongo, MD   360 mg at 11/22/21 1045   zinc sulfate capsule 220 mg  220 mg Oral Daily Karmen Bongo, MD   220 mg at 11/22/21 1046    Allergies as of 11/18/2021 - Review Complete 11/18/2021  Allergen Reaction Noted   Tetanus-diphtheria toxoids td Anaphylaxis 02/18/2021   Meloxicam Nausea And Vomiting 02/18/2021   Phentermine Nausea And Vomiting 02/18/2021   Sulfa antibiotics Diarrhea and Nausea And Vomiting 02/18/2021   Zithromax [azithromycin] Diarrhea and Nausea And Vomiting 02/18/2021    Review of Systems  Constitutional:  Positive for malaise/fatigue. Negative for chills and fever.  HENT:  Negative for  sore throat.   Respiratory:  Positive for shortness of breath. Negative for cough, sputum production and wheezing.   Cardiovascular:  Negative for chest pain and leg swelling.  Gastrointestinal:  Positive for abdominal pain, blood in stool, diarrhea, heartburn and nausea. Negative for constipation, melena and vomiting.  Musculoskeletal:  Positive for myalgias. Negative for falls.  Neurological:  Positive for weakness and headaches. Negative for dizziness and focal weakness.  Psychiatric/Behavioral:  Negative for memory loss.       Physical Exam:  Vital signs in last 24 hours: Temp:  [97.9 F (36.6 C)-98.4 F (36.9 C)] 98.4 F (36.9 C) (02/02 0727) Pulse  Rate:  [64-70] 66 (02/02 0727) Resp:  [14-16] 14 (02/02 0727) BP: (128-159)/(72-84) 141/81 (02/02 0727) SpO2:  [93 %-98 %] 93 % (02/02 0727) Last BM Date: 11/21/21  General:   Obese, somnolent elderly female sitting up in chair. Head:  Normocephalic and atraumatic. Eyes: sclerae anicteric,conjunctive pink  Heart:  regular rate and rhythm Pulm: Clear anteriorly; no wheezing Abdomen:  Soft, Obese AB, Normal bowel sounds. mild tenderness in the lower abdomen. With guarding and Without rebound, without hepatomegaly. Extremities:  Without edema. Msk:  Symmetrical without gross deformities. Peripheral pulses intact.  Neurologic:  Alert and  oriented x4; somnolent, grossly normal neurologically. Skin:   Dry and intact without significant lesions or rashes. Psychiatric: Demonstrates good judgement and reason without abnormal affect or behaviors.  LAB RESULTS: Recent Labs    11/20/21 0424 11/21/21 0318 11/22/21 0118  WBC 9.8 11.9* 8.1  HGB 11.5* 10.8* 11.4*  HCT 35.2* 34.0* 34.9*  PLT 149* 151 145*   BMET Recent Labs    11/20/21 0424 11/21/21 0318 11/22/21 0118  NA 137 136 138  K 3.3* 3.6 3.1*  CL 101 101 100  CO2 27 28 30   GLUCOSE 132* 119* 85  BUN 13 10 8   CREATININE 0.63 0.66 0.68  CALCIUM 8.8* 8.9 8.7*   LFT Recent Labs    11/22/21 0118  PROT 5.3*  ALBUMIN 2.7*  AST 37  ALT 23  ALKPHOS 50  BILITOT 0.1*   PT/INR No results for input(s): LABPROT, INR in the last 72 hours.  STUDIES: VAS [place] LOWER EXTREMITY VENOUS (DVT)  Result Date: 11/21/2021  Lower Venous DVT Study Patient Name:  Courtney [name]  Date of Exam:   11/21/2021 Medical Rec #: [id number]        Accession #:    [id number] Date of Birth: [birth year]        Patient Gender: F Patient Age:   28 years Exam Location:  [facility] Procedure:      VAS [place] LOWER EXTREMITY VENOUS (DVT) Referring Phys: Phillips Climes --------------------------------------------------------------------------------  Indications:  Elevated d-dimer, Covid-19, remote history of PE.  Comparison Study: No prior studies. Performing Technologist: Darlin Coco RDMS, RVT  Examination Guidelines: A complete evaluation includes B-mode imaging, spectral Doppler, color Doppler, and power Doppler as needed of all accessible portions of each vessel. Bilateral testing is considered an integral part of a complete examination. Limited examinations for reoccurring indications may be performed as noted. The reflux portion of the exam is performed with the patient in reverse Trendelenburg.  +---------+---------------+---------+-----------+----------+--------------+  RIGHT     Compressibility Phasicity Spontaneity Properties Thrombus Aging  +---------+---------------+---------+-----------+----------+--------------+  CFV       Full            Yes       Yes                                    +---------+---------------+---------+-----------+----------+--------------+  SFJ       Full                                                             +---------+---------------+---------+-----------+----------+--------------+  FV Prox   Full                                                             +---------+---------------+---------+-----------+----------+--------------+  FV Mid    Full                                                             +---------+---------------+---------+-----------+----------+--------------+  FV Distal Full                                                             +---------+---------------+---------+-----------+----------+--------------+  PFV       Full                                                             +---------+---------------+---------+-----------+----------+--------------+  POP       Full            Yes       Yes                                    +---------+---------------+---------+-----------+----------+--------------+  PTV       Full                                                              +---------+---------------+---------+-----------+----------+--------------+  PERO      Full                                                             +---------+---------------+---------+-----------+----------+--------------+   +---------+---------------+---------+-----------+----------+--------------+  LEFT      Compressibility Phasicity Spontaneity Properties Thrombus Aging  +---------+---------------+---------+-----------+----------+--------------+  CFV       Full            Yes       Yes                                    +---------+---------------+---------+-----------+----------+--------------+  SFJ       Full                                                             +---------+---------------+---------+-----------+----------+--------------+  FV Prox   Full                                                             +---------+---------------+---------+-----------+----------+--------------+  FV Mid    Full                                                             +---------+---------------+---------+-----------+----------+--------------+  FV Distal Full                                                             +---------+---------------+---------+-----------+----------+--------------+  PFV       Full                                                             +---------+---------------+---------+-----------+----------+--------------+  POP       Full            Yes       Yes                                    +---------+---------------+---------+-----------+----------+--------------+  PTV       Full                                                             +---------+---------------+---------+-----------+----------+--------------+  PERO      Full                                                             +---------+---------------+---------+-----------+----------+--------------+     Summary: RIGHT: - There is no evidence of deep vein thrombosis in the lower extremity.  - No cystic structure found in  the popliteal fossa.  LEFT: - There is no evidence of deep vein thrombosis in the lower extremity.  - No cystic structure found in the popliteal fossa.  *See table(s)  above for measurements and observations.    Preliminary      Vladimir Crofts  11/22/2021, 11:32 AM

## 2021-11-22 NOTE — TOC Initial Note (Signed)
Transition of Care [facility]) - Initial/Assessment Note    Patient Details  Name: Courtney [name] MRN: [id number] Date of Birth: [birth year]  [facility] CM/SW Contact:    Cyndi Bender, RN Phone Number: 11/22/2021, 1:09 PM  Clinical Narrative:             Spoke to patient regarding transition needs,  Orders for HH-PT/OT. Patient wants to use the in house provider at [place], [facility]. RNCM needs to call Angela Nevin once patient is discharged at     [phone]. Patient has a walker and a cane at home. Patient's husband will transport patient home once discharged. TOC will continue to follow.  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, adult)                           [place] Arranged: PT, OT [place] Agency: Other - See comment ([place] pro at [employer]) Date [place]: 11/21/21 Time [place]: 1600 Representative spoke with at [place]: Santiago Glad  Prior Living Arrangements/Services 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, adult) Lives with:: Spouse Patient language and need for interpreter reviewed:: Yes Do you feel safe going back to the place where you live?: Yes      Need for Family Participation in Patient Care: Yes (Comment) Care giver support system in place?: Yes (comment)   Criminal Activity/Legal Involvement Pertinent to Current Situation/Hospitalization: No - Comment as needed  Activities of Daily Living Home Assistive Devices/Equipment: None ADL Screening (condition at time of admission) Patient's cognitive ability adequate to  safely complete daily activities?: Yes Is the patient deaf or have difficulty hearing?: No Does the patient have difficulty seeing, even when wearing glasses/contacts?: No Does the patient have difficulty concentrating, remembering, or making decisions?: No Patient able to express need for assistance with ADLs?: Yes Does the patient have difficulty dressing or bathing?: No Independently performs ADLs?: Yes (appropriate for developmental age) Does the patient have difficulty walking or climbing stairs?: Yes Weakness of Legs: Both Weakness of Arms/Hands: Both  Permission Sought/Granted   Permission granted to share information with : Yes, Verbal Permission Granted     Permission granted to share info w AGENCY: home health        Emotional Assessment Appearance:: Appears stated age Attitude/Demeanor/Rapport: Engaged Affect (typically observed): Accepting Orientation: : Oriented to Self, Oriented to Place, Oriented to  Time, Oriented to Situation Alcohol / Substance Use: Not Applicable Psych Involvement: No (comment)  Admission diagnosis:  COVID-19 [U07.1] Patient Active Problem List   Diagnosis Date Noted   Hypokalemia 11/20/2021   COVID-19 infection with colitis 11/18/2021   CAD (coronary artery disease) 11/18/2021   Colitis with rectal bleeding 11/18/2021   AKI (acute kidney injury) ([place]) 11/18/2021   Aspiration pneumonia of left lower lobe due to regurgitated food ([place]) 04/02/2021   Bipolar disorder ([place]) 04/02/2021   Hypertension 04/02/2021   Hyponatremia 04/02/2021   Dyslipidemia 04/02/2021   Hypothyroidism (acquired) 04/02/2021   OSA (obstructive sleep apnea) 04/02/2021   Cervical spondylosis with myelopathy and radiculopathy 03/28/2021   PCP:  Wenda Low, MD Pharmacy:   [facility] [id number] - [address], [place] [address] Phone: [phone] Fax: [phone]  [facility] - [place], [place] [address] Phone: [phone] Fax: [phone]     Social Determinants of Health (SDOH) Interventions    Readmission Risk Interventions No flowsheet data found.

## 2021-11-22 NOTE — Plan of Care (Signed)
°  Problem: Education: Goal: Knowledge of General Education information will improve Description: Including pain rating scale, medication(s)/side effects and non-pharmacologic comfort measures Outcome: Progressing   Problem: Health Behavior/Discharge Planning: Goal: Ability to manage health-related needs will improve Outcome: Progressing   Problem: Clinical Measurements: Goal: Ability to maintain clinical measurements within normal limits will improve Outcome: Progressing Goal: Will remain free from infection Outcome: Progressing Goal: Diagnostic test results will improve Outcome: Progressing Goal: Cardiovascular complication will be avoided Outcome: Progressing   Problem: Activity: Goal: Risk for activity intolerance will decrease Outcome: Progressing   Problem: Nutrition: Goal: Adequate nutrition will be maintained Outcome: Progressing   Problem: Coping: Goal: Level of anxiety will decrease Outcome: Progressing   Problem: Elimination: Goal: Will not experience complications related to urinary retention Outcome: Progressing   Problem: Pain Managment: Goal: General experience of comfort will improve Outcome: Progressing   Problem: Skin Integrity: Goal: Risk for impaired skin integrity will decrease Outcome: Progressing   Problem: Education: Goal: Knowledge of risk factors and measures for prevention of condition will improve Outcome: Progressing   Problem: Coping: Goal: Psychosocial and spiritual needs will be supported Outcome: Progressing   Problem: Respiratory: Goal: Will maintain a patent airway Outcome: Progressing Goal: Complications related to the disease process, condition or treatment will be avoided or minimized Outcome: Progressing   Problem: Clinical Measurements: Goal: Respiratory complications will improve Outcome: Not Progressing   Problem: Elimination: Goal: Will not experience complications related to bowel motility Outcome: Not  Progressing   Problem: Safety: Goal: Ability to remain free from injury will improve Outcome: Not Progressing

## 2021-11-23 ENCOUNTER — Telehealth: Payer: Self-pay

## 2021-11-23 LAB — CBC WITH DIFFERENTIAL/PLATELET
Abs Immature Granulocytes: 0.02 10*3/uL (ref 0.00–0.07)
Basophils Absolute: 0 10*3/uL (ref 0.0–0.1)
Basophils Relative: 0 %
Eosinophils Absolute: 0.2 10*3/uL (ref 0.0–0.5)
Eosinophils Relative: 3 %
HCT: 37.2 % (ref 36.0–46.0)
Hemoglobin: 12 g/dL (ref 12.0–15.0)
Immature Granulocytes: 0 %
Lymphocytes Relative: 35 %
Lymphs Abs: 2.9 10*3/uL (ref 0.7–4.0)
MCH: 28.8 pg (ref 26.0–34.0)
MCHC: 32.3 g/dL (ref 30.0–36.0)
MCV: 89.2 fL (ref 80.0–100.0)
Monocytes Absolute: 1 10*3/uL (ref 0.1–1.0)
Monocytes Relative: 12 %
Neutro Abs: 4.2 10*3/uL (ref 1.7–7.7)
Neutrophils Relative %: 50 %
Platelets: 156 10*3/uL (ref 150–400)
RBC: 4.17 MIL/uL (ref 3.87–5.11)
RDW: 13 % (ref 11.5–15.5)
WBC: 8.4 10*3/uL (ref 4.0–10.5)
nRBC: 0 % (ref 0.0–0.2)

## 2021-11-23 LAB — GASTROINTESTINAL PANEL BY PCR, STOOL (REPLACES STOOL CULTURE)

## 2021-11-23 LAB — COMPREHENSIVE METABOLIC PANEL
ALT: 36 U/L (ref 0–44)
AST: 52 U/L — ABNORMAL HIGH (ref 15–41)
Albumin: 2.6 g/dL — ABNORMAL LOW (ref 3.5–5.0)
Alkaline Phosphatase: 56 U/L (ref 38–126)
Anion gap: 8 (ref 5–15)
BUN: 6 mg/dL — ABNORMAL LOW (ref 8–23)
CO2: 31 mmol/L (ref 22–32)
Calcium: 8.7 mg/dL — ABNORMAL LOW (ref 8.9–10.3)
Chloride: 99 mmol/L (ref 98–111)
Creatinine, Ser: 0.71 mg/dL (ref 0.44–1.00)
GFR, Estimated: >60 mL/min (ref >60)
Glucose, Bld: 89 mg/dL (ref 70–99)
Potassium: 3.8 mmol/L (ref 3.5–5.1)
Sodium: 138 mmol/L (ref 135–145)
Total Bilirubin: 0.6 mg/dL (ref 0.3–1.2)
Total Protein: 5.2 g/dL — ABNORMAL LOW (ref 6.5–8.1)

## 2021-11-23 LAB — MAGNESIUM: Magnesium: 1.6 mg/dL — ABNORMAL LOW (ref 1.7–2.4)

## 2021-11-23 MED ORDER — MAGNESIUM SULFATE 4 GM/100ML IV SOLN
4.0000 g | Freq: Once | INTRAVENOUS | Status: AC
  Administered 2021-11-23: 4 g via INTRAVENOUS
  Filled 2021-11-23: qty 100

## 2021-11-23 MED ORDER — LOPERAMIDE HCL 2 MG PO CAPS
2.0000 mg | ORAL_CAPSULE | ORAL | Status: DC | PRN
  Administered 2021-11-23 (×2): 2 mg via ORAL
  Filled 2021-11-23 (×3): qty 1

## 2021-11-23 NOTE — Telephone Encounter (Signed)
-----   Message from Sharyn Creamer, MD sent at 11/22/2021 10:14 PM EST ----- Hi Courtney [name],  Please arrange for GI clinic follow up with me in 4 weeks for colitis.  Thanks, Lyndee Leo

## 2021-11-23 NOTE — Telephone Encounter (Signed)
Per Dr. Libby Maw request, pt has been scheduled for 4 wk hosp f/u on 12/21/21 @ 930am. Appt reminder has been mailed. Appt will reflect on AVS upon hosp discharge for pt future reference.

## 2021-11-23 NOTE — Progress Notes (Signed)
PROGRESS NOTE        PATIENT DETAILS Name: Courtney [name] Age: 76 y.o. Sex: female Date of Birth: Jul 23, 1946 Admit Date: 11/18/2021 Admitting Physician Karmen Bongo, MD [id number], [place]enton Ar, MD  Brief Summary: 76 year old with history of bipolar disorder, hypothyroidism, HLD, CAD, HTN, bipolar disorder-who was seen in the ED on 1/27-diagnosed with COVID-19 and discharged on Paxlovid, she returned to the ED on 1/29 with vomiting/diarrhea and abdominal pain.  She was subsequently found to have colitis, AKI and admitted to the hospitalist service.  See below for further details.  Significant events: 1/29>> admit to [facility]-COVID/colitis/AKI  Significant imaging studies: 1/29>> CTA abdomen: Colitis extending from rectum to proximal transverse colon 1/29>> CXR: No pneumonia  Pertinent microbiology data: 1/31>> stool C. difficile PCR: Negative 2/2>> GI pathogen panel: Pending  Consultation: Gastroenterology   Subjective: Episodes of loose stools slowly decreasing but still watery-hematuria is apparently has resolved.  Minimal LLQ pain.  Objective: Vitals: Blood pressure 136/77, pulse 69, temperature 98 F (36.7 C), temperature source Oral, resp. rate 17, height 5' 2.5" (1.588 m), weight 93 kg, SpO2 93 %.   Exam: Gen Exam:Alert awake-not in any distress HEENT:atraumatic, normocephalic Chest: B/L clear to auscultation anteriorly CVS:S1S2 regular Abdomen:soft non tender, non distended Extremities:no edema Neurology: Non focal Skin: no rash   Pertinent Labs/Radiology: CBC Latest Ref Rng & Units 11/23/2021 11/22/2021 11/21/2021  WBC 4.0 - 10.5 K/uL 8.4 8.1 11.9(H)  Hemoglobin 12.0 - 15.0 g/dL 12.0 11.4(L) 10.8(L)  Hematocrit 36.0 - 46.0 % 37.2 34.9(L) 34.0(L)  Platelets 150 - 400 K/uL 156 145(L) 151    Lab Results  Component Value Date   NA 138 11/23/2021   K 3.8 11/23/2021   CL 99 11/23/2021   CO2 31 11/23/2021      Assessment/Plan: * COVID-19  infection with colitis- (present on admission) Unclear whether colitis is from COVID infection or bacterial infection/inflammation or ischemic colitis.  Appreciate GI input with recommendations to continue supportive care and empiric antibiotic therapy.  Still has diarrhea but slowing down-although still loose and watery.  Hematochezia has resolved per patient.  Completed IV Remdesivir-remains on Rocephin/Flagyl.  Awaiting GI pathogen panel-we will try some Imodium to see if this helps with her diarrhea as well.    AKI (acute kidney injury) ([place])- (present on admission) Hemodynamically mediated-resolved with supportive care.  Hypokalemia- (present on admission) Due to GI loss-replete and recheck.  CAD (coronary artery disease)- (present on admission) Seen incidentally on CT angio abdomen-no anginal symptoms.  Continue aspirin/statin-we will need cardiology evaluation in the outpatient setting when she has recovered from acute illness.  OSA (obstructive sleep apnea)- (present on admission) Continue with CPAP  Dyslipidemia- (present on admission) Continue high-dose Lipitor  Hypothyroidism (acquired)- (present on admission) Continue Synthroid  Hypertension- (present on admission) BP stable-continue verapamil  Bipolar disorder ([place])- (present on admission) -Continue home meds - Wellbutrin, Lexapro, Ativan  Obesity: Estimated body mass index is 36.9 kg/m as calculated from the following:   Height as of this encounter: 5' 2.5" (1.588 m).   Weight as of this encounter: 93 kg.   DVT Prophylaxis: SQ Lovenox Procedures: None Consults: None Code Status:Full code  Family Communication: Spouse-James-[phone]-updated over the phone on 2/2.   Disposition Plan: Status is: Inpatient Remains inpatient appropriate because: COVID-19-resolving diarrhea-on IV antimicrobial therapy.  Planned Discharge Destination: Home   Diet: Diet Order  DIET SOFT Room service appropriate?  Yes; Fluid consistency: Thin  Diet effective now                     Antimicrobial agents: Anti-infectives (From admission, onward)    Start     Dose/Rate Route Frequency Ordered Stop   11/19/21 1000  remdesivir 100 mg in sodium chloride 0.9 % 100 mL IVPB       See Hyperspace for full Linked Orders Report.   100 mg 200 mL/hr over 30 Minutes Intravenous Daily 11/18/21 0932 11/22/21 1255   11/19/21 0800  cefTRIAXone (ROCEPHIN) 2 g in sodium chloride 0.9 % 100 mL IVPB        2 g 200 mL/hr over 30 Minutes Intravenous Every 24 hours 11/18/21 0932     11/18/21 0945  remdesivir 200 mg in sodium chloride 0.9% 250 mL IVPB       See Hyperspace for full Linked Orders Report.   200 mg 580 mL/hr over 30 Minutes Intravenous Once 11/18/21 0932 11/18/21 1203   11/18/21 0830  cefTRIAXone (ROCEPHIN) 2 g in sodium chloride 0.9 % 100 mL IVPB        2 g 200 mL/hr over 30 Minutes Intravenous  Once 11/18/21 0829 11/18/21 0937   11/18/21 0830  metroNIDAZOLE (FLAGYL) IVPB 500 mg        500 mg 100 mL/hr over 60 Minutes Intravenous Every 12 hours 11/18/21 0829          MEDICATIONS: Scheduled Meds:  vitamin C  500 mg Oral Daily   aspirin  325 mg Oral Daily   atorvastatin  80 mg Oral Daily   buPROPion  150 mg Oral Daily   chlorhexidine  15 mL Mouth Rinse BID   enoxaparin (LOVENOX) injection  40 mg Subcutaneous Q24H   escitalopram  10 mg Oral Daily   fluticasone  2 spray Each Nare BID   fluticasone  2 puff Inhalation BID   gabapentin  100 mg Oral TID   levothyroxine  50 mcg Oral QAC breakfast   LORazepam  1 mg Oral QHS   mouth rinse  15 mL Mouth Rinse q12n4p   melatonin  5 mg Oral QHS   mirabegron ER  25 mg Oral Daily   montelukast  10 mg Oral Daily   ondansetron  4 mg Oral Q8H   Or   ondansetron (ZOFRAN) IV  4 mg Intravenous Q8H   pantoprazole  40 mg Oral BID AC   sodium chloride flush  3 mL Intravenous Q12H   sodium chloride flush  3 mL Intravenous Q12H   verapamil  360 mg Oral Daily    zinc sulfate  220 mg Oral Daily   Continuous Infusions:  sodium chloride     cefTRIAXone (ROCEPHIN)  IV 2 g (11/23/21 1049)   metronidazole 500 mg (11/23/21 1051)   promethazine (PHENERGAN) injection (IM or IVPB)     PRN Meds:.sodium chloride, acetaminophen, albuterol, chlorpheniramine-HYDROcodone, cyclobenzaprine, guaiFENesin-dextromethorphan, loperamide, LORazepam, oxyCODONE, promethazine (PHENERGAN) injection (IM or IVPB), sodium chloride flush   I have personally reviewed following labs and imaging studies  LABORATORY DATA: CBC: Recent Labs  Lab 11/19/21 0701 11/20/21 0424 11/21/21 0318 11/22/21 0118 11/23/21 0133  WBC 10.3 9.8 11.9* 8.1 8.4  NEUTROABS 9.2* 8.3* 10.3* 4.5 4.2  HGB 12.5 11.5* 10.8* 11.4* 12.0  HCT 38.4 35.2* 34.0* 34.9* 37.2  MCV 90.6 89.1 89.2 88.8 89.2  PLT 150 149* 151 145* [id number]    Basic Metabolic Panel: Recent Labs  Lab 11/19/21 0701 11/20/21 0424 11/21/21 0318 11/22/21 0118 11/23/21 0133  NA 137 137 136 138 138  K 3.7 3.3* 3.6 3.1* 3.8  CL 102 101 101 100 99  CO2 28 27 28 30 31   GLUCOSE 128* 132* 119* 85 89  BUN 11 13 10 8  6*  CREATININE 0.67 0.63 0.66 0.68 0.71  CALCIUM 8.7* 8.8* 8.9 8.7* 8.7*  MG 1.7 1.7  --   --  1.6*  PHOS 3.1 2.6  --   --   --     GFR: Estimated Creatinine Clearance: 67.2 mL/min (by C-G formula based on SCr of 0.71 mg/dL).  Liver Function Tests: Recent Labs  Lab 11/19/21 0701 11/20/21 0424 11/21/21 0318 11/22/21 0118 11/23/21 0133  AST 22 22 30  37 52*  ALT 16 16 20 23  36  ALKPHOS 65 57 59 50 56  BILITOT 0.5 0.2* 0.4 0.1* 0.6  PROT 5.6* 5.5* 5.2* 5.3* 5.2*  ALBUMIN 2.7* 2.7* 2.7* 2.7* 2.6*   Recent Labs  Lab 11/18/21 0619  LIPASE 22   No results for input(s): AMMONIA in the last 168 hours.  Coagulation Profile: No results for input(s): INR, PROTIME in the last 168 hours.  Cardiac Enzymes: No results for input(s): CKTOTAL, CKMB, CKMBINDEX, TROPONINI in the last 168 hours.  BNP (last 3  results) No results for input(s): PROBNP in the last 8760 hours.  Lipid Profile: No results for input(s): CHOL, HDL, LDLCALC, TRIG, CHOLHDL, LDLDIRECT in the last 72 hours.  Thyroid Function Tests: No results for input(s): TSH, T4TOTAL, FREET4, T3FREE, THYROIDAB in the last 72 hours.  Anemia Panel: No results for input(s): VITAMINB12, FOLATE, FERRITIN, TIBC, IRON, RETICCTPCT in the last 72 hours.   Urine analysis:    Component Value Date/Time   COLORURINE YELLOW 11/16/2021 0828   APPEARANCEUR CLEAR 11/16/2021 0828   LABSPEC 1.008 11/16/2021 0828   PHURINE 5.0 11/16/2021 0828   GLUCOSEU NEGATIVE 11/16/2021 0828   HGBUR NEGATIVE 11/16/2021 0828   BILIRUBINUR NEGATIVE 11/16/2021 0828   KETONESUR NEGATIVE 11/16/2021 0828   PROTEINUR NEGATIVE 11/16/2021 0828   NITRITE NEGATIVE 11/16/2021 0828   LEUKOCYTESUR MODERATE (A) 11/16/2021 0828    Sepsis Labs: Lactic Acid, Venous    Component Value Date/Time   LATICACIDVEN 2.0 ([place]) 11/18/2021 0750    MICROBIOLOGY: Recent Results (from the past 240 hour(s))  C Difficile Quick Screen w PCR reflex     Status: None   Collection Time: 11/20/21 12:05 AM   Specimen: STOOL  Result Value Ref Range Status   C Diff antigen NEGATIVE NEGATIVE Final   C Diff toxin NEGATIVE NEGATIVE Final   C Diff interpretation No C. difficile detected.  Final    Comment: Performed at [facility], [place] [address] [place], [place] [address]  Resp Panel by RT-PCR (Flu A&B, Covid) Nasopharyngeal Swab     Status: Abnormal   Collection Time: 11/20/21  7:15 AM   Specimen: Nasopharyngeal Swab; Nasopharyngeal(NP) swabs in vial transport medium  Result Value Ref Range Status   SARS Coronavirus 2 by RT PCR POSITIVE (A) NEGATIVE Final    Comment: (NOTE) SARS-CoV-2 target nucleic acids are DETECTED.  The SARS-CoV-2 RNA is generally detectable in upper respiratory specimens during the acute phase of infection. Positive results are indicative of the presence of  the identified virus, but do not rule out bacterial infection or co-infection with other pathogens not detected by the test. Clinical correlation with patient history and other diagnostic information is necessary to determine patient  infection status. The expected result is Negative.  Fact Sheet for Patients: EntrepreneurPulse.com.au  Fact Sheet for Healthcare Providers: IncredibleEmployment.be  This test is not yet approved or cleared by the [place] FDA and  has been authorized for detection and/or diagnosis of SARS-CoV-2 by FDA under an Emergency Use Authorization (EUA).  This EUA will remain in effect (meaning this test can be used) for the duration of  the COVID-19 declaration under Section 564(b)(1) of the A ct, 21 U.S.C. section 360bbb-3(b)(1), unless the authorization is terminated or revoked sooner.     Influenza A by PCR NEGATIVE NEGATIVE Final   Influenza B by PCR NEGATIVE NEGATIVE Final    Comment: (NOTE) The Xpert Xpress SARS-CoV-2/FLU/RSV plus assay is intended as an aid in the diagnosis of influenza from Nasopharyngeal swab specimens and should not be used as a sole basis for treatment. Nasal washings and aspirates are unacceptable for Xpert Xpress SARS-CoV-2/FLU/RSV testing.  Fact Sheet for Patients: EntrepreneurPulse.com.au  Fact Sheet for Healthcare Providers: IncredibleEmployment.be  This test is not yet approved or cleared by the [place] FDA and has been authorized for detection and/or diagnosis of SARS-CoV-2 by FDA under an Emergency Use Authorization (EUA). This EUA will remain in effect (meaning this test can be used) for the duration of the COVID-19 declaration under Section 564(b)(1) of the Act, 21 U.S.C. section 360bbb-3(b)(1), unless the authorization is terminated or revoked.  Performed at [facility], [place] [address] [place], [place] [address]      RADIOLOGY STUDIES/RESULTS: VAS [place] LOWER EXTREMITY VENOUS (DVT)  Result Date: 11/22/2021  Lower Venous DVT Study Patient Name:  SINDIA GAMBLES  Date of Exam:   11/21/2021 Medical Rec #: [id number]        Accession #:    [id number] Date of Birth: May 06, 1946        Patient Gender: F Patient Age:   64 years Exam Location:  [facility] Procedure:      VAS [place] LOWER EXTREMITY VENOUS (DVT) Referring Phys: Phillips Climes --------------------------------------------------------------------------------  Indications: Elevated d-dimer, Covid-19, remote history of PE.  Comparison Study: No prior studies. Performing Technologist: Darlin Coco RDMS, RVT  Examination Guidelines: A complete evaluation includes B-mode imaging, spectral Doppler, color Doppler, and power Doppler as needed of all accessible portions of each vessel. Bilateral testing is considered an integral part of a complete examination. Limited examinations for reoccurring indications may be performed as noted. The reflux portion of the exam is performed with the patient in reverse Trendelenburg.  +---------+---------------+---------+-----------+----------+--------------+  RIGHT     Compressibility Phasicity Spontaneity Properties Thrombus Aging  +---------+---------------+---------+-----------+----------+--------------+  CFV       Full            Yes       Yes                                    +---------+---------------+---------+-----------+----------+--------------+  SFJ       Full                                                             +---------+---------------+---------+-----------+----------+--------------+  FV Prox   Full                                                             +---------+---------------+---------+-----------+----------+--------------+  FV Mid    Full                                                             +---------+---------------+---------+-----------+----------+--------------+  FV Distal Full                                                              +---------+---------------+---------+-----------+----------+--------------+  PFV       Full                                                             +---------+---------------+---------+-----------+----------+--------------+  POP       Full            Yes       Yes                                    +---------+---------------+---------+-----------+----------+--------------+  PTV       Full                                                             +---------+---------------+---------+-----------+----------+--------------+  PERO      Full                                                             +---------+---------------+---------+-----------+----------+--------------+   +---------+---------------+---------+-----------+----------+--------------+  LEFT      Compressibility Phasicity Spontaneity Properties Thrombus Aging  +---------+---------------+---------+-----------+----------+--------------+  CFV       Full            Yes       Yes                                    +---------+---------------+---------+-----------+----------+--------------+  SFJ       Full                                                             +---------+---------------+---------+-----------+----------+--------------+  FV Prox   Full                                                             +---------+---------------+---------+-----------+----------+--------------+  FV Mid    Full                                                             +---------+---------------+---------+-----------+----------+--------------+  FV Distal Full                                                             +---------+---------------+---------+-----------+----------+--------------+  PFV       Full                                                             +---------+---------------+---------+-----------+----------+--------------+  POP       Full            Yes       Yes                                     +---------+---------------+---------+-----------+----------+--------------+  PTV       Full                                                             +---------+---------------+---------+-----------+----------+--------------+  PERO      Full                                                             +---------+---------------+---------+-----------+----------+--------------+     Summary: RIGHT: - There is no evidence of deep vein thrombosis in the lower extremity.  - No cystic structure found in the popliteal fossa.  LEFT: - There is no evidence of deep vein thrombosis in the lower extremity.  - No cystic structure found in the popliteal fossa.  *See table(s) above for measurements and observations. Electronically signed by Orlie Pollen on 11/22/2021 at 5:47:28 PM.    Final      LOS: 5 days   Oren Binet, MD  [facility]    To contact the attending provider between 7A-7P or the covering provider during after hours 7P-7A, please log into the web site [email or web address] and access using universal [place] password for that web site. If you do not have the password, please call the hospital operator.  11/23/2021, 12:43 PM

## 2021-11-23 NOTE — Progress Notes (Signed)
Occupational Therapy Treatment Patient Details Name: Courtney [name] MRN: [id number] DOB: [birth year] Today's Date: 11/23/2021   History of present illness 76 y.o. female presenting 11/18/21 with SOB.  She tested positive for COVID on 1/27 and started Paxlovid. Developed N/V/D; colitis;    PMH significant for bipolar disorder, HTN, mitral valve prolapse, claustrophobia, CVA, vertigo, PE, prior back surgery, B knee replacements.   OT comments  OT treatment session with focus on self-care re-education, OOB activity tolerance and short-distance functional mobility ~46ft with use of rollator. Patient currently requiring Min A overall with ADLs and would benefit from continued acute OT services in prep for safe d/c to next level of care. OT will continue to follow acutely.    Recommendations for follow up therapy are one component of a multi-disciplinary discharge planning process, led by the attending physician.  Recommendations may be updated based on patient status, additional functional criteria and insurance authorization.    Follow Up Recommendations  Home health OT    Assistance Recommended at Discharge Frequent or constant Supervision/Assistance  Patient can return home with the following  A little help with bathing/dressing/bathroom;A little help with walking and/or transfers;Assistance with cooking/housework;Assist for transportation   Equipment Recommendations  None recommended by OT    Recommendations for Other Services      Precautions / Restrictions Precautions Precautions: Fall Restrictions Weight Bearing Restrictions: No       Mobility Bed Mobility Overal bed mobility: Needs Assistance Bed Mobility: Supine to Sit     Supine to sit: Supervision     General bed mobility comments: HOB slightly elevated +rial. Increased time/effort. No external assist required.    Transfers Overall transfer level: Needs assistance Equipment used: Rollator (4 wheels) Transfers: Sit  to/from Stand Sit to Stand: Min guard     Step pivot transfers: Min guard     General transfer comment: Min guard for sit to stand from EOB and from low commode in bathroom (heavy use of rail).     Balance Overall balance assessment: Needs assistance Sitting-balance support: No upper extremity supported, Feet supported Sitting balance-Leahy Scale: Good     Standing balance support: Bilateral upper extremity supported, Reliant on assistive device for balance Standing balance-Leahy Scale: Poor                             ADL either performed or assessed with clinical judgement   ADL Overall ADL's : Needs assistance/impaired     Grooming: Set up;Sitting Grooming Details (indicate cue type and reason): 2/3 grooming tasks seated on rollator at sink level.             Lower Body Dressing: Minimal assistance;Sit to/from stand Lower Body Dressing Details (indicate cue type and reason): Able to doff/don footwear seated EOB in figure-4 position without external assist. Toilet Transfer: Min guard;Ambulation;Rollator (4 wheels) Toilet Transfer Details (indicate cue type and reason): To standard height commode in bathroom with rollator and Min guard. Toileting- Water quality scientist and Hygiene: Min guard;Sit to/from stand Toileting - Water quality scientist Details (indicate cue type and reason): Min gaurd for hygiene/clothing management            Extremity/Trunk Assessment              Vision       Perception     Praxis      Cognition Arousal/Alertness: Awake/alert Behavior During Therapy: WFL for tasks assessed/performed Overall Cognitive Status: Within Functional Limits for tasks  assessed                                          Exercises      Shoulder Instructions       General Comments SpO2 97-95% at rest and with light activity. Patient reports continued generalized weakness.    Pertinent Vitals/ Pain       Pain  Assessment Pain Assessment: No/denies pain  Home Living                                          Prior Functioning/Environment              Frequency  Min 2X/week        Progress Toward Goals  OT Goals(current goals can now be found in the care plan section)  Progress towards OT goals: Progressing toward goals  Acute Rehab OT Goals Patient Stated Goal: To return to ILF apartment OT Goal Formulation: With patient Time For Goal Achievement: 12/03/21 Potential to Achieve Goals: Good ADL Goals Pt Will Perform Grooming: with supervision;with set-up;standing Pt Will Perform Upper Body Bathing: with supervision;with set-up;sitting Pt Will Perform Lower Body Bathing: with min assist;with min guard assist;sitting/lateral leans;sit to/from stand Pt Will Perform Upper Body Dressing: with supervision;with set-up;sitting Pt Will Perform Lower Body Dressing: with min assist;sitting/lateral leans;sit to/from stand Pt Will Transfer to Toilet: with supervision;with modified independence;ambulating Pt Will Perform Toileting - Clothing Manipulation and hygiene: with min guard assist;with supervision;sit to/from stand  Plan Discharge plan remains appropriate;Frequency remains appropriate    Co-evaluation                 AM-PAC OT "6 Clicks" Daily Activity     Outcome Measure   Help from another person eating meals?: None Help from another person taking care of personal grooming?: A Little Help from another person toileting, which includes using toliet, bedpan, or urinal?: A Little Help from another person bathing (including washing, rinsing, drying)?: A Lot Help from another person to put on and taking off regular upper body clothing?: A Little Help from another person to put on and taking off regular lower body clothing?: A Lot 6 Click Score: 17    End of Session Equipment Utilized During Treatment: Rollator (4 wheels)  OT Visit Diagnosis: Unsteadiness on  feet (R26.81);Other abnormalities of gait and mobility (R26.89);Muscle weakness (generalized) (M62.81)   Activity Tolerance Patient tolerated treatment well   Patient Left in bed;with call bell/phone within reach   Nurse Communication Mobility status        Time: [id number] OT Time Calculation (min): 27 min  Charges: OT General Charges $OT Visit: 1 Visit OT Treatments $Self Care/Home Management : 23-37 mins  Baylee Mccorkel H. OTR/L Supplemental OT, Department of rehab services [phone]  Brinson Tozzi R H. 11/23/2021, 1:57 PM

## 2021-11-24 LAB — BASIC METABOLIC PANEL
Anion gap: 9 (ref 5–15)
BUN: <5 mg/dL — ABNORMAL LOW (ref 8–23)
CO2: 32 mmol/L (ref 22–32)
Calcium: 8.3 mg/dL — ABNORMAL LOW (ref 8.9–10.3)
Chloride: 97 mmol/L — ABNORMAL LOW (ref 98–111)
Creatinine, Ser: 0.73 mg/dL (ref 0.44–1.00)
GFR, Estimated: >60 mL/min (ref >60)
Glucose, Bld: 104 mg/dL — ABNORMAL HIGH (ref 70–99)
Potassium: 3.2 mmol/L — ABNORMAL LOW (ref 3.5–5.1)
Sodium: 138 mmol/L (ref 135–145)

## 2021-11-24 LAB — MAGNESIUM: Magnesium: 2.2 mg/dL (ref 1.7–2.4)

## 2021-11-24 MED ORDER — POTASSIUM CHLORIDE CRYS ER 20 MEQ PO TBCR
40.0000 meq | EXTENDED_RELEASE_TABLET | ORAL | Status: AC
  Administered 2021-11-24 (×2): 40 meq via ORAL
  Filled 2021-11-24 (×2): qty 2

## 2021-11-24 MED ORDER — AMOXICILLIN-POT CLAVULANATE 875-125 MG PO TABS: 1.0000 | ORAL_TABLET | Freq: Two times a day (BID) | ORAL | 0 refills | Status: AC

## 2021-11-24 NOTE — Progress Notes (Signed)
AVS reviewed and pt verbalized understanding of all DC teaching and instructions. PT IV removed without complications. Pt has all her belongings including [place] in her possesion. Pt leaving with husband as transportation back it ILF.

## 2021-11-24 NOTE — Discharge Summary (Addendum)
PATIENT DETAILS Name: Courtney [name] Age: 76 y.o. Sex: female Date of Birth: 1946-03-06 MRN: [id number]. Admitting Physician: Karmen Bongo, MD [id number], Denton Ar, MD  Admit Date: 11/18/2021 Discharge date: 11/24/2021  Recommendations for Outpatient Follow-up:  Follow up with PCP in 1-2 weeks Please obtain CMP/CBC in one week Please consider outpatient cardiology referral-incidental finding of CAD on CT imaging. Please ensure follow-up with gastroenterology.  Admitted From:  Home  Disposition: Assisted living   Discharge Condition: good  CODE STATUS:   Code Status: Full Code   Diet recommendation:  Diet Order             Diet - low sodium heart healthy           DIET SOFT Room service appropriate? Yes; Fluid consistency: Thin  Diet effective now                    Brief Summary: 76 year old with history of bipolar disorder, hypothyroidism, HLD, CAD, HTN, bipolar disorder-who was seen in the ED on 1/27-diagnosed with COVID-19 and discharged on Paxlovid, she returned to the ED on 1/29 with vomiting/diarrhea and abdominal pain.  She was subsequently found to have colitis, AKI and admitted to the hospitalist service.  See below for further details.  Significant events: 1/29>> admit to [facility]-COVID/colitis/AKI  Significant imaging studies: 1/29>> CTA abdomen: Colitis extending from rectum to proximal transverse colon 1/29>> CXR: No pneumonia  Pertinent microbiology data: 1/31>> stool C. difficile PCR: Negative 2/2>> GI pathogen panel: negative  Consultation: Gastroenterology   Brief Hospital Course: * COVID-19 infection with colitis- (present on admission) Unclear whether colitis is from COVID infection or bacterial infection/inflammation or ischemic colitis.  Appreciate GI input with recommendations to continue supportive care and empiric antibiotic therapy x10 days total.  Thankfully her diarrhea has completely resolved-has completed a course of IV  Remdesivir-and as noted above will be discharged on antibiotics to complete a 10-day course.   AKI (acute kidney injury) ([place])- (present on admission) Hemodynamically mediated-resolved with supportive care.  Hypokalemia- (present on admission) Due to GI loss-will be repleted prior to discharge.  PCP to reassess in 1 week.  CAD (coronary artery disease)- (present on admission) Seen incidentally on CT angio abdomen-no anginal symptoms.  Continue aspirin/statin-we will need cardiology evaluation in the outpatient setting when she has recovered from acute illness.  OSA (obstructive sleep apnea)- (present on admission) Continue with CPAP  Dyslipidemia- (present on admission) Continue high-dose Lipitor  Hypothyroidism (acquired)- (present on admission) Continue Synthroid  Hypertension- (present on admission) BP stable-continue verapamil and Benicar.  Bipolar disorder ([place])- (present on admission) Continue home meds - Wellbutrin, Lexapro, Ativan  Obesity: Estimated body mass index is 36.9 kg/m as calculated from the following:   Height as of this encounter: 5' 2.5" (1.588 m).   Weight as of this encounter: 93 kg.   Discharge Diagnoses:  Principal Problem:   COVID-19 infection with colitis Active Problems:   AKI (acute kidney injury) ([place])   Hypokalemia   CAD (coronary artery disease)   OSA (obstructive sleep apnea)   Dyslipidemia   Hypothyroidism (acquired)   Hypertension   Bipolar disorder ([place])   Colitis with rectal bleeding   Discharge Instructions:  Activity:  As tolerated with Full fall precautions use walker/cane & assistance as needed   Discharge Instructions     Call MD for:  persistant nausea and vomiting   Complete by: As directed    Call MD for:  severe uncontrolled pain   Complete by:  As directed    Diet - low sodium heart healthy   Complete by: As directed    Discharge instructions   Complete by: As directed    Follow with Primary MD  Wenda Low, MD in 1-2 weeks  Please get a complete blood count and chemistry panel checked by your Primary MD at your next visit, and again as instructed by your Primary MD.  Get Medicines reviewed and adjusted: Please take all your medications with you for your next visit with your Primary MD  Laboratory/radiological data: Please request your Primary MD to go over all hospital tests and procedure/radiological results at the follow up, please ask your Primary MD to get all Hospital records sent to his/her office.  In some cases, they will be blood work, cultures and biopsy results pending at the time of your discharge. Please request that your primary care M.D. follows up on these results.  Also Note the following: If you experience worsening of your admission symptoms, develop shortness of breath, life threatening emergency, suicidal or homicidal thoughts you must seek medical attention immediately by calling 911 or calling your MD immediately  if symptoms less severe.  You must read complete instructions/literature along with all the possible adverse reactions/side effects for all the Medicines you take and that have been prescribed to you. Take any new Medicines after you have completely understood and accpet all the possible adverse reactions/side effects.   Do not drive when taking Pain medications or sleeping medications (Benzodaizepines)  Do not take more than prescribed Pain, Sleep and Anxiety Medications. It is not advisable to combine anxiety,sleep and pain medications without talking with your primary care practitioner  Special Instructions: If you have smoked or chewed Tobacco  in the last 2 yrs please stop smoking, stop any regular Alcohol  and or any Recreational drug use.  Wear Seat belts while driving.  Please note: You were cared for by a hospitalist during your hospital stay. Once you are discharged, your primary care physician will handle any further medical issues. Please  note that NO REFILLS for any discharge medications will be authorized once you are discharged, as it is imperative that you return to your primary care physician (or establish a relationship with a primary care physician if you do not have one) for your post hospital discharge needs so that they can reassess your need for medications and monitor your lab values.   Incidental finding-coronary artery disease seen on CT chest/abdomen-please ask your primary care practitioner for a cardiology referral   Increase activity slowly   Complete by: As directed       Allergies as of 11/24/2021       Reactions   Tetanus-diphtheria Toxoids Td Anaphylaxis   Meloxicam Nausea And Vomiting   Phentermine Nausea And Vomiting   Sulfa Antibiotics Diarrhea, Nausea And Vomiting   Zithromax [azithromycin] Diarrhea, Nausea And Vomiting        Medication List     TAKE these medications    acetaminophen 325 MG tablet Commonly known as: TYLENOL Take 650 mg by mouth every 6 (six) hours as needed for moderate pain.   albuterol 108 (90 Base) MCG/ACT inhaler Commonly known as: VENTOLIN HFA Inhale 1 puff into the lungs every 6 (six) hours as needed for wheezing or shortness of breath.   amoxicillin-clavulanate 875-125 MG tablet Commonly known as: Augmentin Take 1 tablet by mouth 2 (two) times daily for 3 days.   aspirin 325 MG tablet Take 325 mg by  mouth daily.   atorvastatin 80 MG tablet Commonly known as: LIPITOR Take 80 mg by mouth daily.   Biotin 5000 MCG Caps Take 5,000 mcg by mouth daily.   buPROPion 150 MG 24 hr tablet Commonly known as: WELLBUTRIN XL Take 150 mg by mouth daily.   cyanocobalamin 1000 MCG/ML injection Commonly known as: (VITAMIN B-12) Inject 1,000 mcg into the muscle every 30 (thirty) days.   cyclobenzaprine 10 MG tablet Commonly known as: FLEXERIL Take 1 tablet (10 mg total) by mouth 3 (three) times daily as needed for muscle spasms.   diclofenac Sodium 1 %  Gel Commonly known as: VOLTAREN Apply 2 g topically daily.   docusate sodium 100 MG capsule Commonly known as: COLACE Take 200 mg by mouth daily.   escitalopram 10 MG tablet Commonly known as: LEXAPRO Take 10 mg by mouth daily.   Flovent HFA 44 MCG/ACT inhaler Generic drug: fluticasone Inhale 2 puffs into the lungs in the morning and at bedtime.   fluticasone 50 MCG/ACT nasal spray Commonly known as: FLONASE Place 2 sprays into both nostrils in the morning and at bedtime.   gabapentin 100 MG capsule Commonly known as: NEURONTIN Take 100 mg by mouth in the morning, at noon, and at bedtime.   Gemtesa 75 MG Tabs Generic drug: Vibegron Take 75 mg by mouth at bedtime.   levothyroxine 50 MCG tablet Commonly known as: SYNTHROID Take 50 mcg by mouth daily before breakfast.   LORazepam 1 MG tablet Commonly known as: ATIVAN Take 1 mg by mouth daily.   magnesium oxide 400 MG tablet Commonly known as: MAG-OX Take 400 mg by mouth daily.   meclizine 25 MG tablet Commonly known as: ANTIVERT Take 25 mg by mouth 3 (three) times daily as needed for dizziness.   melatonin 5 MG Tabs Take 5 mg by mouth at bedtime.   montelukast 10 MG tablet Commonly known as: SINGULAIR Take 10 mg by mouth daily.   multivitamin-lutein Caps capsule Take 1 capsule by mouth daily.   olmesartan 40 MG tablet Commonly known as: BENICAR Take 40 mg by mouth daily.   ondansetron 4 MG tablet Commonly known as: ZOFRAN Take 4 mg by mouth every 8 (eight) hours as needed for nausea or vomiting.   Ozempic (0.25 or 0.5 MG/DOSE) 2 MG/1.5ML Sopn Generic drug: Semaglutide(0.25 or 0.5MG /DOS) Inject 0.25 mg into the skin every Tuesday.   pantoprazole 40 MG tablet Commonly known as: PROTONIX Take 40 mg by mouth daily.   psyllium 0.52 g capsule Commonly known as: REGULOID Take 1.04 g by mouth daily.   verapamil 120 MG CR tablet Commonly known as: CALAN-SR Take 360 mg by mouth daily.   Vitamin D3 25  MCG (1000 UT) Caps Take 1,000 Units by mouth daily.        Follow-up Information     Wenda Low, MD. Schedule an appointment as soon as possible for a visit in 1 week(s).   Specialty: Internal Medicine Contact information: [address] [place] [address] [phone]                Allergies  Allergen Reactions   Tetanus-Diphtheria Toxoids Td Anaphylaxis   Meloxicam Nausea And Vomiting   Phentermine Nausea And Vomiting   Sulfa Antibiotics Diarrhea and Nausea And Vomiting   Zithromax [Azithromycin] Diarrhea and Nausea And Vomiting     Other Procedures/Studies: DG Chest Portable 1 View  Result Date: 11/18/2021 CLINICAL DATA:  DVA. EXAM: PORTABLE CHEST 1 VIEW COMPARISON:  Two days ago FINDINGS: Cardiomegaly and aortic tortuosity. Elevated right diaphragm. Interstitial crowding at the bases. There is no edema, air bronchogram, effusion, or pneumothorax. IMPRESSION: Stable low volume chest with right diaphragm elevation. Electronically Signed   By: Jorje Guild M.D.   On: 11/18/2021 06:17   DG Chest Portable 1 View  Result Date: 11/16/2021 CLINICAL DATA:  Cough. EXAM: PORTABLE CHEST 1 VIEW COMPARISON:  04/01/2021 FINDINGS: Chronic elevation of the right hemidiaphragm. Both lungs are clear. Surgical plate in the lower cervical spine. There appears to be surgical hardware in the lower lumbar spine but incompletely evaluated. Heart size is normal. IMPRESSION: 1. No acute cardiopulmonary disease. 2. Chronic elevation of the right hemidiaphragm. Electronically Signed   By: Markus Daft M.D.   On: 11/16/2021 08:45   VAS [place] LOWER EXTREMITY VENOUS (DVT)  Result Date: 11/22/2021  Lower Venous DVT Study Patient Name:  NADA MEKEEL  Date of Exam:   11/21/2021 Medical Rec #: [id number]        Accession #:    [id number] Date of Birth: 1946/10/21        Patient Gender: F Patient Age:   62 years Exam Location:  [facility] Procedure:      VAS [place] LOWER EXTREMITY  VENOUS (DVT) Referring Phys: Phillips Climes --------------------------------------------------------------------------------  Indications: Elevated d-dimer, Covid-19, remote history of PE.  Comparison Study: No prior studies. Performing Technologist: Darlin Coco RDMS, RVT  Examination Guidelines: A complete evaluation includes B-mode imaging, spectral Doppler, color Doppler, and power Doppler as needed of all accessible portions of each vessel. Bilateral testing is considered an integral part of a complete examination. Limited examinations for reoccurring indications may be performed as noted. The reflux portion of the exam is performed with the patient in reverse Trendelenburg.  +---------+---------------+---------+-----------+----------+--------------+  RIGHT     Compressibility Phasicity Spontaneity Properties Thrombus Aging  +---------+---------------+---------+-----------+----------+--------------+  CFV       Full            Yes       Yes                                    +---------+---------------+---------+-----------+----------+--------------+  SFJ       Full                                                             +---------+---------------+---------+-----------+----------+--------------+  FV Prox   Full                                                             +---------+---------------+---------+-----------+----------+--------------+  FV Mid    Full                                                             +---------+---------------+---------+-----------+----------+--------------+  FV Distal Full                                                             +---------+---------------+---------+-----------+----------+--------------+  PFV       Full                                                             +---------+---------------+---------+-----------+----------+--------------+  POP       Full            Yes       Yes                                     +---------+---------------+---------+-----------+----------+--------------+  PTV       Full                                                             +---------+---------------+---------+-----------+----------+--------------+  PERO      Full                                                             +---------+---------------+---------+-----------+----------+--------------+   +---------+---------------+---------+-----------+----------+--------------+  LEFT      Compressibility Phasicity Spontaneity Properties Thrombus Aging  +---------+---------------+---------+-----------+----------+--------------+  CFV       Full            Yes       Yes                                    +---------+---------------+---------+-----------+----------+--------------+  SFJ       Full                                                             +---------+---------------+---------+-----------+----------+--------------+  FV Prox   Full                                                             +---------+---------------+---------+-----------+----------+--------------+  FV Mid    Full                                                             +---------+---------------+---------+-----------+----------+--------------+  FV Distal Full                                                             +---------+---------------+---------+-----------+----------+--------------+  PFV       Full                                                             +---------+---------------+---------+-----------+----------+--------------+  POP       Full            Yes       Yes                                    +---------+---------------+---------+-----------+----------+--------------+  PTV       Full                                                             +---------+---------------+---------+-----------+----------+--------------+  PERO      Full                                                              +---------+---------------+---------+-----------+----------+--------------+     Summary: RIGHT: - There is no evidence of deep vein thrombosis in the lower extremity.  - No cystic structure found in the popliteal fossa.  LEFT: - There is no evidence of deep vein thrombosis in the lower extremity.  - No cystic structure found in the popliteal fossa.  *See table(s) above for measurements and observations. Electronically signed by Orlie Pollen on 11/22/2021 at 5:47:28 PM.    Final    CT Angio Abd/Pel W and/or Wo Contrast  Result Date: 11/18/2021 CLINICAL DATA:  76 year old female with history of lower GI bleed. Nausea, vomiting, diarrhea and abdominal pain. Diagnosed with COVID yesterday morning. Weakness. EXAM: CTA ABDOMEN AND PELVIS WITHOUT AND WITH CONTRAST TECHNIQUE: Multidetector CT imaging of the abdomen and pelvis was performed using the standard protocol during bolus administration of intravenous contrast. Multiplanar reconstructed images and MIPs were obtained and reviewed to evaluate the vascular anatomy. RADIATION DOSE REDUCTION: This exam was performed according to the departmental dose-optimization program which includes automated exposure control, adjustment of the mA and/or kV according to patient size and/or use of iterative reconstruction technique. CONTRAST:  53mL OMNIPAQUE IOHEXOL 350 MG/ML SOLN COMPARISON:  No priors. FINDINGS: VASCULAR Aorta: Aortic atherosclerosis. Normal caliber aorta without aneurysm, dissection, vasculitis or significant stenosis. Celiac: Patent without evidence of aneurysm, dissection, vasculitis or significant stenosis. SMA: Patent without evidence of aneurysm, dissection, vasculitis or significant stenosis. Renals: Both renal arteries are patent without evidence of aneurysm, dissection, vasculitis, fibromuscular dysplasia or significant stenosis. IMA: Patent without evidence of aneurysm, dissection, vasculitis or significant stenosis. Inflow: Patent without evidence of  aneurysm, dissection, vasculitis or significant stenosis. Proximal Outflow: Bilateral common femoral and visualized portions of the superficial and profunda femoral arteries are patent without evidence of aneurysm, dissection, vasculitis or significant stenosis. Veins: No obvious venous abnormality within the limitations of this arterial phase study. Review of the MIP images confirms the above  findings. NON-VASCULAR Lower chest: Elevation of the right hemidiaphragm. Atherosclerotic calcifications in the descending thoracic aorta as well as the left main, left anterior descending, left circumflex and right coronary arteries. Dilatation of the pulmonic trunk (3.7 cm in diameter), concerning for pulmonary arterial hypertension. Subsegmental atelectasis throughout the right lung base. Hepatobiliary: No suspicious cystic or solid hepatic lesions. No intra or extrahepatic biliary ductal dilatation. Status post cholecystectomy. Pancreas: No pancreatic mass. No pancreatic ductal dilatation. No pancreatic or peripancreatic fluid collections or inflammatory changes. Spleen: Unremarkable. Adrenals/Urinary Tract: Subcentimeter low-attenuation lesion in the upper pole of the left kidney, too small to characterize, but statistically likely to represent a tiny cyst. Right kidney and bilateral adrenal glands are normal in appearance. No hydroureteronephrosis. Urinary bladder is normal in appearance. Stomach/Bowel: The appearance of the stomach is normal. There is no pathologic dilatation of small bowel or colon. Numerous colonic diverticulae are noted. There are inflammatory changes adjacent to the descending colon, which could suggest an acute diverticulitis in this region. However, there is also relatively diffuse colorectal mural thickening which extends to the level of the proximal transverse colon where there is also associated mucosal hyperenhancement and mild hypervascularity in the mesocolon, indicative of colitis. The  appendix is not confidently identified and may be surgically absent. Regardless, there are no inflammatory changes noted adjacent to the cecum to suggest the presence of an acute appendicitis at this time. Lymphatic: No lymphadenopathy noted in the abdomen or pelvis. Reproductive: Status post hysterectomy. Ovaries are not confidently identified may be surgically absent or atrophic. Other: No significant volume of ascites.  No pneumoperitoneum. Musculoskeletal: Orthopedic fixation hardware in place posterior to L3-L5. There are no aggressive appearing lytic or blastic lesions noted in the visualized portions of the skeleton. IMPRESSION: VASCULAR 1. No acute vascular abnormality in the abdomen or pelvis. NON-VASCULAR 1. Diffuse mural thickening, mucosal hyperenhancement and hypervascularity of the associated mesorectum/mesocolon indicative of proctocolitis. This extends to the level of the proximal transverse colon. Clinical correlation for signs and symptoms of ulcerative colitis is recommended. 2. There is also colonic diverticulosis. Focal inflammatory changes are noted adjacent to the descending colon such that acute diverticulitis can not be entirely excluded, however, these findings are relatively mild and could simply be attributable to underlying colitis. 3. Dilatation of the pulmonic trunk (3.7 cm in diameter), concerning for pulmonary arterial hypertension. 4. Aortic atherosclerosis, in addition to left main and three-vessel coronary artery disease. Assessment for potential risk factor modification, dietary therapy or pharmacologic therapy may be warranted, if clinically indicated. 5. Additional incidental findings, as above. Electronically Signed   By: Vinnie Langton M.D.   On: 11/18/2021 08:24     TODAY-DAY OF DISCHARGE:  Subjective:   [name] today has no headache,no chest abdominal pain,no new weakness tingling or numbness, feels much better wants to go home today.   Objective:   Blood  pressure 128/65, pulse 64, temperature (!) 97.1 F (36.2 C), temperature source Oral, resp. rate 12, height 5' 2.5" (1.588 m), weight 93 kg, SpO2 100 %.  Intake/Output Summary (Last 24 hours) at 11/24/2021 1336 Last data filed at 11/24/2021 0545 Gross per 24 hour  Intake --  Output 1175 ml  Net -1175 ml   Filed Weights   11/18/21 0545  Weight: 93 kg    Exam: Awake Alert, Oriented *3, No new F.N deficits, Normal affect [place].AT,PERRAL Supple Neck,No JVD, No cervical lymphadenopathy appriciated.  Symmetrical Chest wall movement, Good air movement bilaterally, CTAB RRR,No Gallops,Rubs or new Murmurs,  No Parasternal Heave +ve B.Sounds, Abd Soft, Non tender, No organomegaly appriciated, No rebound -guarding or rigidity. No Cyanosis, Clubbing or edema, No new Rash or bruise   PERTINENT RADIOLOGIC STUDIES: No results found.   PERTINENT LAB RESULTS: CBC: Recent Labs    11/22/21 0118 11/23/21 0133  WBC 8.1 8.4  HGB 11.4* 12.0  HCT 34.9* 37.2  PLT 145* 156   CMET CMP     Component Value Date/Time   NA 138 11/24/2021 0114   K 3.2 (L) 11/24/2021 0114   CL 97 (L) 11/24/2021 0114   CO2 32 11/24/2021 0114   GLUCOSE 104 (H) 11/24/2021 0114   BUN <5 (L) 11/24/2021 0114   CREATININE 0.73 11/24/2021 0114   CALCIUM 8.3 (L) 11/24/2021 0114   PROT 5.2 (L) 11/23/2021 0133   ALBUMIN 2.6 (L) 11/23/2021 0133   AST 52 (H) 11/23/2021 0133   ALT 36 11/23/2021 0133   ALKPHOS 56 11/23/2021 0133   BILITOT 0.6 11/23/2021 0133   GFRNONAA >60 11/24/2021 0114    GFR Estimated Creatinine Clearance: 67.2 mL/min (by C-G formula based on SCr of 0.73 mg/dL). No results for input(s): LIPASE, AMYLASE in the last 72 hours. No results for input(s): CKTOTAL, CKMB, CKMBINDEX, TROPONINI in the last 72 hours. Invalid input(s): POCBNP No results for input(s): DDIMER in the last 72 hours. No results for input(s): HGBA1C in the last 72 hours. No results for input(s): CHOL, HDL, LDLCALC, TRIG, CHOLHDL,  LDLDIRECT in the last 72 hours. No results for input(s): TSH, T4TOTAL, T3FREE, THYROIDAB in the last 72 hours.  Invalid input(s): FREET3 No results for input(s): VITAMINB12, FOLATE, FERRITIN, TIBC, IRON, RETICCTPCT in the last 72 hours. Coags: No results for input(s): INR in the last 72 hours.  Invalid input(s): PT Microbiology: Recent Results (from the past 240 hour(s))  C Difficile Quick Screen w PCR reflex     Status: None   Collection Time: 11/20/21 12:05 AM   Specimen: STOOL  Result Value Ref Range Status   C Diff antigen NEGATIVE NEGATIVE Final   C Diff toxin NEGATIVE NEGATIVE Final   C Diff interpretation No C. difficile detected.  Final    Comment: Performed at [facility], [place] [address] [place], [place] [address]  Resp Panel by RT-PCR (Flu A&B, Covid) Nasopharyngeal Swab     Status: Abnormal   Collection Time: 11/20/21  7:15 AM   Specimen: Nasopharyngeal Swab; Nasopharyngeal(NP) swabs in vial transport medium  Result Value Ref Range Status   SARS Coronavirus 2 by RT PCR POSITIVE (A) NEGATIVE Final    Comment: (NOTE) SARS-CoV-2 target nucleic acids are DETECTED.  The SARS-CoV-2 RNA is generally detectable in upper respiratory specimens during the acute phase of infection. Positive results are indicative of the presence of the identified virus, but do not rule out bacterial infection or co-infection with other pathogens not detected by the test. Clinical correlation with patient history and other diagnostic information is necessary to determine patient infection status. The expected result is Negative.  Fact Sheet for Patients: EntrepreneurPulse.com.au  Fact Sheet for Healthcare Providers: IncredibleEmployment.be  This test is not yet approved or cleared by the [place] FDA and  has been authorized for detection and/or diagnosis of SARS-CoV-2 by FDA under an Emergency Use Authorization (EUA).  This EUA will remain in  effect (meaning this test can be used) for the duration of  the COVID-19 declaration under Section 564(b)(1) of the A ct, 21 U.S.C. section 360bbb-3(b)(1), unless the authorization is terminated  or revoked sooner.     Influenza A by PCR NEGATIVE NEGATIVE Final   Influenza B by PCR NEGATIVE NEGATIVE Final    Comment: (NOTE) The Xpert Xpress SARS-CoV-2/FLU/RSV plus assay is intended as an aid in the diagnosis of influenza from Nasopharyngeal swab specimens and should not be used as a sole basis for treatment. Nasal washings and aspirates are unacceptable for Xpert Xpress SARS-CoV-2/FLU/RSV testing.  Fact Sheet for Patients: EntrepreneurPulse.com.au  Fact Sheet for Healthcare Providers: IncredibleEmployment.be  This test is not yet approved or cleared by the [place] FDA and has been authorized for detection and/or diagnosis of SARS-CoV-2 by FDA under an Emergency Use Authorization (EUA). This EUA will remain in effect (meaning this test can be used) for the duration of the COVID-19 declaration under Section 564(b)(1) of the Act, 21 U.S.C. section 360bbb-3(b)(1), unless the authorization is terminated or revoked.  Performed at [facility], [place] [address] [place], [place] [address]   Gastrointestinal Panel by PCR , Stool     Status: None   Collection Time: 11/22/21  1:38 PM   Specimen: Stool  Result Value Ref Range Status   Campylobacter species NOT DETECTED NOT DETECTED Final   Plesimonas shigelloides NOT DETECTED NOT DETECTED Final   Salmonella species NOT DETECTED NOT DETECTED Final   Yersinia enterocolitica NOT DETECTED NOT DETECTED Final   Vibrio species NOT DETECTED NOT DETECTED Final   Vibrio cholerae NOT DETECTED NOT DETECTED Final   Enteroaggregative E coli (EAEC) NOT DETECTED NOT DETECTED Final   Enteropathogenic E coli (EPEC) NOT DETECTED NOT DETECTED Final   Enterotoxigenic E coli (ETEC) NOT DETECTED NOT DETECTED  Final   Shiga like toxin producing E coli (STEC) NOT DETECTED NOT DETECTED Final   Shigella/Enteroinvasive E coli (EIEC) NOT DETECTED NOT DETECTED Final   Cryptosporidium NOT DETECTED NOT DETECTED Final   Cyclospora cayetanensis NOT DETECTED NOT DETECTED Final   Entamoeba histolytica NOT DETECTED NOT DETECTED Final   Giardia lamblia NOT DETECTED NOT DETECTED Final   Adenovirus F40/41 NOT DETECTED NOT DETECTED Final   Astrovirus NOT DETECTED NOT DETECTED Final   Norovirus GI/GII NOT DETECTED NOT DETECTED Final   Rotavirus A NOT DETECTED NOT DETECTED Final   Sapovirus (I, II, IV, and V) NOT DETECTED NOT DETECTED Final    Comment: Performed at [facility], [address] [place],  [address]    FURTHER DISCHARGE INSTRUCTIONS:  Get Medicines reviewed and adjusted: Please take all your medications with you for your next visit with your Primary MD  Laboratory/radiological data: Please request your Primary MD to go over all hospital tests and procedure/radiological results at the follow up, please ask your Primary MD to get all Hospital records sent to his/her office.  In some cases, they will be blood work, cultures and biopsy results pending at the time of your discharge. Please request that your primary care M.D. goes through all the records of your hospital data and follows up on these results.  Also Note the following: If you experience worsening of your admission symptoms, develop shortness of breath, life threatening emergency, suicidal or homicidal thoughts you must seek medical attention immediately by calling 911 or calling your MD immediately  if symptoms less severe.  You must read complete instructions/literature along with all the possible adverse reactions/side effects for all the Medicines you take and that have been prescribed to you. Take any new Medicines after you have completely understood and accpet all the possible adverse reactions/side effects.  Do  not drive when taking Pain medications or sleeping medications (Benzodaizepines)  Do not take more than prescribed Pain, Sleep and Anxiety Medications. It is not advisable to combine anxiety,sleep and pain medications without talking with your primary care practitioner  Special Instructions: If you have smoked or chewed Tobacco  in the last 2 yrs please stop smoking, stop any regular Alcohol  and or any Recreational drug use.  Wear Seat belts while driving.  Please note: You were cared for by a hospitalist during your hospital stay. Once you are discharged, your primary care physician will handle any further medical issues. Please note that NO REFILLS for any discharge medications will be authorized once you are discharged, as it is imperative that you return to your primary care physician (or establish a relationship with a primary care physician if you do not have one) for your post hospital discharge needs so that they can reassess your need for medications and monitor your lab values.  Total Time spent coordinating discharge including counseling, education and face to face time equals greater than 30 minutes.  SignedOren Binet 11/24/2021 1:36 PM

## 2021-11-24 NOTE — TOC Transition Note (Signed)
Transition of Care [facility]) - CM/SW Discharge Note   Patient Details  Name: Courtney [name] MRN: [id number] Date of Birth: 18-Dec-1945  Transition of Care [facility]) CM/SW Contact:  Lawerance Sabal, RN Phone Number: 11/24/2021, 2:03 PM   Clinical Narrative:    Donita Brooks referral to [facility] at [phone], number provided by bedside nurse. LVM with Carla at [phone] to notify of transition to [facility] services at facility. CSW made aware of DC as well.       Barriers to Discharge: Continued Medical Work up   Patient Goals and CMS Choice        Discharge Placement                       Discharge Plan and Services   Discharge Planning Services: CM Consult Post Acute Care Choice: Home Health                    HH Arranged: PT, OT HH Agency: Other - See comment ([place] [facility] at [facility]) Date [facility] Contacted: 11/21/21 Time HH Agency Contacted: 1600 Representative spoke with at [facility] Agency: Clydie Braun  Social Determinants of Health (SDOH) Interventions     Readmission Risk Interventions No flowsheet data found.

## 2021-11-27 DIAGNOSIS — R2689 Other abnormalities of gait and mobility: Secondary | ICD-10-CM | POA: Diagnosis not present

## 2021-11-27 DIAGNOSIS — U099 Post covid-19 condition, unspecified: Secondary | ICD-10-CM | POA: Diagnosis not present

## 2021-11-28 DIAGNOSIS — N179 Acute kidney failure, unspecified: Secondary | ICD-10-CM | POA: Diagnosis not present

## 2021-11-28 DIAGNOSIS — K529 Noninfective gastroenteritis and colitis, unspecified: Secondary | ICD-10-CM | POA: Diagnosis not present

## 2021-11-28 DIAGNOSIS — I2510 Atherosclerotic heart disease of native coronary artery without angina pectoris: Secondary | ICD-10-CM | POA: Diagnosis not present

## 2021-11-28 DIAGNOSIS — E876 Hypokalemia: Secondary | ICD-10-CM | POA: Diagnosis not present

## 2021-11-28 DIAGNOSIS — U071 COVID-19: Secondary | ICD-10-CM | POA: Diagnosis not present

## 2021-11-29 DIAGNOSIS — R2689 Other abnormalities of gait and mobility: Secondary | ICD-10-CM | POA: Diagnosis not present

## 2021-11-29 DIAGNOSIS — U099 Post covid-19 condition, unspecified: Secondary | ICD-10-CM | POA: Diagnosis not present

## 2021-12-06 ENCOUNTER — Inpatient Hospital Stay (HOSPITAL_COMMUNITY)
Admission: EM | Admit: 2021-12-06 | Discharge: 2021-12-18 | DRG: 372 | Disposition: A | Discharge status: Skilled Nursing Facility | Payer: Medicare HMO | Attending: Internal Medicine | Admitting: Internal Medicine

## 2021-12-06 ENCOUNTER — Other Ambulatory Visit: Payer: Self-pay

## 2021-12-06 ENCOUNTER — Observation Stay (HOSPITAL_COMMUNITY): Payer: Medicare HMO

## 2021-12-06 DIAGNOSIS — F319 Bipolar disorder, unspecified: Secondary | ICD-10-CM | POA: Diagnosis not present

## 2021-12-06 DIAGNOSIS — K6389 Other specified diseases of intestine: Secondary | ICD-10-CM | POA: Diagnosis not present

## 2021-12-06 DIAGNOSIS — K5792 Diverticulitis of intestine, part unspecified, without perforation or abscess without bleeding: Secondary | ICD-10-CM | POA: Diagnosis present

## 2021-12-06 DIAGNOSIS — A0472 Enterocolitis due to Clostridium difficile, not specified as recurrent: Principal | ICD-10-CM | POA: Diagnosis present

## 2021-12-06 DIAGNOSIS — R197 Diarrhea, unspecified: Secondary | ICD-10-CM | POA: Diagnosis not present

## 2021-12-06 DIAGNOSIS — E871 Hypo-osmolality and hyponatremia: Secondary | ICD-10-CM | POA: Diagnosis not present

## 2021-12-06 DIAGNOSIS — R600 Localized edema: Secondary | ICD-10-CM | POA: Diagnosis present

## 2021-12-06 DIAGNOSIS — E860 Dehydration: Secondary | ICD-10-CM

## 2021-12-06 DIAGNOSIS — R109 Unspecified abdominal pain: Secondary | ICD-10-CM | POA: Diagnosis not present

## 2021-12-06 DIAGNOSIS — N179 Acute kidney failure, unspecified: Secondary | ICD-10-CM | POA: Diagnosis present

## 2021-12-06 DIAGNOSIS — J309 Allergic rhinitis, unspecified: Secondary | ICD-10-CM | POA: Diagnosis not present

## 2021-12-06 DIAGNOSIS — M6281 Muscle weakness (generalized): Secondary | ICD-10-CM | POA: Diagnosis not present

## 2021-12-06 DIAGNOSIS — J9811 Atelectasis: Secondary | ICD-10-CM | POA: Diagnosis present

## 2021-12-06 DIAGNOSIS — R339 Retention of urine, unspecified: Secondary | ICD-10-CM | POA: Diagnosis present

## 2021-12-06 DIAGNOSIS — Z6838 Body mass index (BMI) 38.0-38.9, adult: Secondary | ICD-10-CM | POA: Diagnosis not present

## 2021-12-06 DIAGNOSIS — R2689 Other abnormalities of gait and mobility: Secondary | ICD-10-CM | POA: Diagnosis not present

## 2021-12-06 DIAGNOSIS — G4733 Obstructive sleep apnea (adult) (pediatric): Secondary | ICD-10-CM | POA: Diagnosis present

## 2021-12-06 DIAGNOSIS — R0602 Shortness of breath: Secondary | ICD-10-CM

## 2021-12-06 DIAGNOSIS — J45909 Unspecified asthma, uncomplicated: Secondary | ICD-10-CM | POA: Diagnosis present

## 2021-12-06 DIAGNOSIS — Z887 Allergy status to serum and vaccine status: Secondary | ICD-10-CM

## 2021-12-06 DIAGNOSIS — I959 Hypotension, unspecified: Secondary | ICD-10-CM | POA: Diagnosis present

## 2021-12-06 DIAGNOSIS — K5732 Diverticulitis of large intestine without perforation or abscess without bleeding: Secondary | ICD-10-CM | POA: Diagnosis not present

## 2021-12-06 DIAGNOSIS — D649 Anemia, unspecified: Secondary | ICD-10-CM | POA: Diagnosis present

## 2021-12-06 DIAGNOSIS — R0902 Hypoxemia: Secondary | ICD-10-CM | POA: Diagnosis not present

## 2021-12-06 DIAGNOSIS — Z86711 Personal history of pulmonary embolism: Secondary | ICD-10-CM

## 2021-12-06 DIAGNOSIS — R531 Weakness: Secondary | ICD-10-CM | POA: Diagnosis not present

## 2021-12-06 DIAGNOSIS — Z8673 Personal history of transient ischemic attack (TIA), and cerebral infarction without residual deficits: Secondary | ICD-10-CM

## 2021-12-06 DIAGNOSIS — N390 Urinary tract infection, site not specified: Secondary | ICD-10-CM | POA: Diagnosis not present

## 2021-12-06 DIAGNOSIS — Z8616 Personal history of COVID-19: Secondary | ICD-10-CM | POA: Diagnosis not present

## 2021-12-06 DIAGNOSIS — M19031 Primary osteoarthritis, right wrist: Secondary | ICD-10-CM | POA: Diagnosis present

## 2021-12-06 DIAGNOSIS — Z888 Allergy status to other drugs, medicaments and biological substances status: Secondary | ICD-10-CM

## 2021-12-06 DIAGNOSIS — Z743 Need for continuous supervision: Secondary | ICD-10-CM | POA: Diagnosis not present

## 2021-12-06 DIAGNOSIS — Z882 Allergy status to sulfonamides status: Secondary | ICD-10-CM

## 2021-12-06 DIAGNOSIS — E785 Hyperlipidemia, unspecified: Secondary | ICD-10-CM | POA: Diagnosis present

## 2021-12-06 DIAGNOSIS — Z79890 Hormone replacement therapy (postmenopausal): Secondary | ICD-10-CM

## 2021-12-06 DIAGNOSIS — Z7982 Long term (current) use of aspirin: Secondary | ICD-10-CM

## 2021-12-06 DIAGNOSIS — E039 Hypothyroidism, unspecified: Secondary | ICD-10-CM | POA: Diagnosis not present

## 2021-12-06 DIAGNOSIS — R635 Abnormal weight gain: Secondary | ICD-10-CM | POA: Diagnosis not present

## 2021-12-06 DIAGNOSIS — N200 Calculus of kidney: Secondary | ICD-10-CM | POA: Diagnosis not present

## 2021-12-06 DIAGNOSIS — U099 Post covid-19 condition, unspecified: Secondary | ICD-10-CM | POA: Diagnosis present

## 2021-12-06 DIAGNOSIS — E669 Obesity, unspecified: Secondary | ICD-10-CM | POA: Diagnosis present

## 2021-12-06 DIAGNOSIS — E876 Hypokalemia: Secondary | ICD-10-CM | POA: Diagnosis present

## 2021-12-06 DIAGNOSIS — R278 Other lack of coordination: Secondary | ICD-10-CM | POA: Diagnosis not present

## 2021-12-06 DIAGNOSIS — R55 Syncope and collapse: Secondary | ICD-10-CM | POA: Diagnosis not present

## 2021-12-06 DIAGNOSIS — I10 Essential (primary) hypertension: Secondary | ICD-10-CM | POA: Diagnosis present

## 2021-12-06 DIAGNOSIS — I700 Atherosclerosis of aorta: Secondary | ICD-10-CM | POA: Diagnosis not present

## 2021-12-06 DIAGNOSIS — Z79899 Other long term (current) drug therapy: Secondary | ICD-10-CM

## 2021-12-06 DIAGNOSIS — M19032 Primary osteoarthritis, left wrist: Secondary | ICD-10-CM | POA: Diagnosis present

## 2021-12-06 DIAGNOSIS — F410 Panic disorder [episodic paroxysmal anxiety] without agoraphobia: Secondary | ICD-10-CM | POA: Diagnosis present

## 2021-12-06 LAB — CBC WITH DIFFERENTIAL/PLATELET
Abs Immature Granulocytes: 0.06 10*3/uL (ref 0.00–0.07)
Basophils Absolute: 0.1 10*3/uL (ref 0.0–0.1)
Basophils Relative: 1 %
Eosinophils Absolute: 0.2 10*3/uL (ref 0.0–0.5)
Eosinophils Relative: 1 %
HCT: 37.2 % (ref 36.0–46.0)
Hemoglobin: 11.8 g/dL — ABNORMAL LOW (ref 12.0–15.0)
Immature Granulocytes: 0 %
Lymphocytes Relative: 9 %
Lymphs Abs: 1.3 10*3/uL (ref 0.7–4.0)
MCH: 28.8 pg (ref 26.0–34.0)
MCHC: 31.7 g/dL (ref 30.0–36.0)
MCV: 90.7 fL (ref 80.0–100.0)
Monocytes Absolute: 2 10*3/uL — ABNORMAL HIGH (ref 0.1–1.0)
Monocytes Relative: 13 %
Neutro Abs: 12 10*3/uL — ABNORMAL HIGH (ref 1.7–7.7)
Neutrophils Relative %: 76 %
Platelets: 293 10*3/uL (ref 150–400)
RBC: 4.1 MIL/uL (ref 3.87–5.11)
RDW: 13.6 % (ref 11.5–15.5)
WBC: 15.7 10*3/uL — ABNORMAL HIGH (ref 4.0–10.5)
nRBC: 0 % (ref 0.0–0.2)

## 2021-12-06 LAB — COMPREHENSIVE METABOLIC PANEL
ALT: 11 U/L (ref 0–44)
AST: 15 U/L (ref 15–41)
Albumin: 2.4 g/dL — ABNORMAL LOW (ref 3.5–5.0)
Alkaline Phosphatase: 87 U/L (ref 38–126)
Anion gap: 9 (ref 5–15)
BUN: 15 mg/dL (ref 8–23)
CO2: 25 mmol/L (ref 22–32)
Calcium: 8.3 mg/dL — ABNORMAL LOW (ref 8.9–10.3)
Chloride: 100 mmol/L (ref 98–111)
Creatinine, Ser: 1.26 mg/dL — ABNORMAL HIGH (ref 0.44–1.00)
GFR, Estimated: 45 mL/min — ABNORMAL LOW (ref >60)
Glucose, Bld: 121 mg/dL — ABNORMAL HIGH (ref 70–99)
Potassium: 3.7 mmol/L (ref 3.5–5.1)
Sodium: 134 mmol/L — ABNORMAL LOW (ref 135–145)
Total Bilirubin: 0.4 mg/dL (ref 0.3–1.2)
Total Protein: 5.3 g/dL — ABNORMAL LOW (ref 6.5–8.1)

## 2021-12-06 LAB — MAGNESIUM: Magnesium: 1.6 mg/dL — ABNORMAL LOW (ref 1.7–2.4)

## 2021-12-06 LAB — LIPASE, BLOOD: Lipase: 21 U/L (ref 11–51)

## 2021-12-06 MED ORDER — MORPHINE SULFATE (PF) 2 MG/ML IV SOLN
2.0000 mg | Freq: Once | INTRAVENOUS | Status: AC
  Administered 2021-12-06: 2 mg via INTRAVENOUS
  Filled 2021-12-06: qty 1

## 2021-12-06 MED ORDER — SODIUM CHLORIDE 0.9 % IV BOLUS
1000.0000 mL | Freq: Once | INTRAVENOUS | Status: AC
  Administered 2021-12-06: 1000 mL via INTRAVENOUS

## 2021-12-06 MED ORDER — IOHEXOL 300 MG/ML  SOLN
80.0000 mL | Freq: Once | INTRAMUSCULAR | Status: AC | PRN
  Administered 2021-12-06: 80 mL via INTRAVENOUS

## 2021-12-06 MED ORDER — MAGNESIUM OXIDE -MG SUPPLEMENT 400 (240 MG) MG PO TABS
800.0000 mg | ORAL_TABLET | Freq: Once | ORAL | Status: AC
  Administered 2021-12-07: 800 mg via ORAL
  Filled 2021-12-06: qty 2

## 2021-12-06 MED ORDER — ONDANSETRON HCL 4 MG/2ML IJ SOLN
4.0000 mg | Freq: Once | INTRAMUSCULAR | Status: AC
  Administered 2021-12-06: 4 mg via INTRAVENOUS
  Filled 2021-12-06: qty 2

## 2021-12-06 MED ORDER — SODIUM CHLORIDE 0.9 % IV BOLUS
1000.0000 mL | Freq: Once | INTRAVENOUS | Status: AC
  Administered 2021-12-07: 1000 mL via INTRAVENOUS

## 2021-12-06 NOTE — ED Notes (Signed)
Patient transported to CT 

## 2021-12-06 NOTE — ED Provider Notes (Signed)
[facility] EMERGENCY DEPARTMENT Provider Note   CSN: [id number] Arrival date & time: 12/06/21  2125     History  Chief Complaint  Patient presents with   Diarrhea   Hypotension    Courtney [name] is a 76 y.o. female.  76 yo F with a chief complaints of diarrhea.  The patient was in the hospital recently and found to have colitis.  She had improvement and was discharged to a rehab facility.  Unfortunately she said when she got there she continued to have diarrhea and progressive weakness.  She felt she was get a pass out earlier today.  Was found to be hypotensive with EMS and sent here for evaluation.   Diarrhea     Home Medications Prior to Admission medications   Medication Sig Start Date End Date Taking? Authorizing Provider  acetaminophen (TYLENOL) 325 MG tablet Take 650 mg by mouth every 6 (six) hours as needed for moderate pain.    [provider]  albuterol (VENTOLIN HFA) 108 (90 Base) MCG/ACT inhaler Inhale 1 puff into the lungs every 6 (six) hours as needed for wheezing or shortness of breath. 02/22/21   [provider]  aspirin 325 MG tablet Take 325 mg by mouth daily.    [provider]  atorvastatin (LIPITOR) 80 MG tablet Take 80 mg by mouth daily. 02/21/21   [provider]  Biotin 5000 MCG CAPS Take 5,000 mcg by mouth daily.    [provider]  buPROPion (WELLBUTRIN XL) 150 MG 24 hr tablet Take 150 mg by mouth daily. 05/27/16   [provider]  Cholecalciferol (VITAMIN D3) 25 MCG (1000 UT) CAPS Take 1,000 Units by mouth daily.    [provider]  cyanocobalamin (,VITAMIN B-12,) 1000 MCG/ML injection Inject 1,000 mcg into the muscle every 30 (thirty) days. 02/23/21   [provider]  cyclobenzaprine (FLEXERIL) 10 MG tablet Take 1 tablet (10 mg total) by mouth 3 (three) times daily as needed for muscle spasms. 04/04/21   Viona Gilmore D, NP  diclofenac Sodium (VOLTAREN) 1 % GEL Apply 2  g topically daily. 12/02/20   [provider]  docusate sodium (COLACE) 100 MG capsule Take 200 mg by mouth daily.    [provider]  escitalopram (LEXAPRO) 10 MG tablet Take 10 mg by mouth daily. 11/24/20   [provider]  FLOVENT HFA 44 MCG/ACT inhaler Inhale 2 puffs into the lungs in the morning and at bedtime. 02/22/21   [provider]  fluticasone (FLONASE) 50 MCG/ACT nasal spray Place 2 sprays into both nostrils in the morning and at bedtime. 10/29/21   [provider]  gabapentin (NEURONTIN) 100 MG capsule Take 100 mg by mouth in the morning, at noon, and at bedtime. 02/13/21   [provider]  levothyroxine (SYNTHROID) 50 MCG tablet Take 50 mcg by mouth daily before breakfast. 02/09/21   [provider]  LORazepam (ATIVAN) 1 MG tablet Take 1 mg by mouth daily. 11/06/21   [provider]  magnesium oxide (MAG-OX) 400 MG tablet Take 400 mg by mouth daily.    [provider]  meclizine (ANTIVERT) 25 MG tablet Take 25 mg by mouth 3 (three) times daily as needed for dizziness.    [provider]  melatonin 5 MG TABS Take 5 mg by mouth at bedtime.    [provider]  montelukast (SINGULAIR) 10 MG tablet Take 10 mg by mouth daily. 02/13/21   [provider]  multivitamin-lutein (OCUVITE-LUTEIN) CAPS capsule Take 1 capsule by mouth daily.    [provider]  olmesartan (BENICAR) 40 MG tablet Take 40 mg by mouth daily. 02/21/21   [provider]  ondansetron (ZOFRAN) 4 MG tablet Take 4 mg by mouth every 8 (eight) hours as needed for nausea or vomiting.    [provider]  OZEMPIC, 0.25 OR 0.5 MG/DOSE, 2 MG/1.5ML SOPN Inject 0.25 mg into the skin every Tuesday. 02/27/21   [provider]  pantoprazole (PROTONIX) 40 MG tablet Take 40 mg by mouth daily. 02/13/21   [provider]  psyllium (REGULOID) 0.52 g capsule Take 1.04 g by mouth daily.    [provider]  verapamil (CALAN-SR) 120 MG CR tablet Take 360 mg by mouth daily. 02/13/21   [provider]  Vibegron (GEMTESA) 75 MG TABS Take 75 mg by mouth at bedtime.    [provider]      Allergies    Tetanus-diphtheria toxoids td, Meloxicam, Phentermine, Sulfa antibiotics, and Zithromax [azithromycin]    Review of Systems   Review of Systems  Gastrointestinal:  Positive for diarrhea.   Physical Exam Updated Vital Signs BP (!) 99/52    Pulse (!) 58    Temp 97.6 F (36.4 C) (Oral)    Resp 17    Ht 5\' 2"  (0000000 m)    Wt 89.8 kg    SpO2 92%    BMI 36.21 kg/m  Physical Exam Vitals and nursing note reviewed.  Constitutional:      General: She is not in acute distress.    Appearance: She is well-developed. She is not diaphoretic.  HENT:     Head: Normocephalic and atraumatic.  Eyes:     Pupils: Pupils are equal, round, and reactive to light.  Cardiovascular:     Rate and Rhythm: Normal rate and regular rhythm.     Heart sounds: No murmur heard.   No friction rub. No gallop.  Pulmonary:     Effort: Pulmonary effort is normal.     Breath sounds: No wheezing or rales.  Abdominal:     General: There is no distension.     Palpations: Abdomen is soft.     Tenderness: There is abdominal tenderness.     Comments: Mild diffuse abdominal discomfort.   Musculoskeletal:        General: No tenderness.     Cervical back: Normal range of motion and neck supple.  Skin:    General: Skin is warm and dry.  Neurological:     Mental Status: She is alert and oriented to person, place, and time.  Psychiatric:        Behavior: Behavior normal.    ED Results / Procedures / Treatments   Labs (all labs ordered are listed, but only abnormal results are displayed) Labs Reviewed  CBC WITH DIFFERENTIAL/PLATELET - Abnormal; Notable for the following components:      Result Value   WBC 15.7 (*)    Hemoglobin 11.8 (*)    Neutro Abs 12.0 (*)    Monocytes Absolute 2.0 (*)     All other components within normal limits  COMPREHENSIVE METABOLIC PANEL - Abnormal; Notable for the following components:   Sodium 134 (*)    Glucose, Bld 121 (*)    Creatinine, Ser 1.26 (*)    Calcium 8.3 (*)    Total Protein 5.3 (*)    Albumin 2.4 (*)    GFR, Estimated 45 (*)  All other components within normal limits  MAGNESIUM - Abnormal; Notable for the following components:   Magnesium 1.6 (*)    All other components within normal limits  LIPASE, BLOOD  URINALYSIS, ROUTINE W REFLEX MICROSCOPIC    EKG EKG Interpretation  Date/Time:  Thursday December 06 2021 21:29:33 EST Ventricular Rate:  64 PR Interval:  138 QRS Duration: 95 QT Interval:  469 QTC Calculation: 484 R Axis:   46 Text Interpretation: Sinus rhythm Abnormal R-wave progression, early transition No significant change since last tracing Confirmed by Melene Plan [phone]) on 12/06/2021 10:20:58 PM  Radiology No results found.  Procedures Procedures    Medications Ordered in ED Medications  sodium chloride 0.9 % bolus 1,000 mL (has no administration in time range)  magnesium oxide (MAG-OX) tablet 800 mg (has no administration in time range)  sodium chloride 0.9 % bolus 1,000 mL (1,000 mLs Intravenous New Bag/Given 12/06/21 2216)  morphine (PF) 2 MG/ML injection 2 mg (2 mg Intravenous Given 12/06/21 2237)  ondansetron (ZOFRAN) injection 4 mg (4 mg Intravenous Given 12/06/21 2237)    ED Course/ Medical Decision Making/ A&P                           Medical Decision Making Amount and/or Complexity of Data Reviewed Labs: ordered. Radiology: ordered.  Risk Prescription drug management.   76 yo F with a chief complaint of diffuse abdominal discomfort and diarrhea and fatigue.  She was recently hospitalized for colitis that was thought to be con commitment with COVID.  She was discharged to a rehab have facility and has had persistent diarrhea.  Today felt she was get a pass out and EMS was called.  Found to  be hypotensive and then sent to the Emergency Department for evaluation.  We will obtain a laboratory evaluation and repeat CT scan of the abdomen pelvis if things have not improved bolus of IV fluids reassess.  Patient's lab work with an acute kidney injury baseline creatinine about 0.6 or 7 up to 1.4.  Patient's blood pressure improved with IV fluids we will give a second bolus.  No significant anemia.  Mild decreased mag level.  CRITICAL CARE Performed by: Rae Roam   Total critical care time: 35 minutes  Critical care time was exclusive of separately billable procedures and treating other patients.  Critical care was necessary to treat or prevent imminent or life-threatening deterioration.  Critical care was time spent personally by me on the following activities: development of treatment plan with patient and/or surrogate as well as nursing, discussions with consultants, evaluation of patient's response to treatment, examination of patient, obtaining history from patient or surrogate, ordering and performing treatments and interventions, ordering and review of laboratory studies, ordering and review of radiographic studies, pulse oximetry and re-evaluation of patient's condition.   The patients results and plan were reviewed and discussed.   Any x-rays performed were independently reviewed by myself.   Differential diagnosis were considered with the presenting HPI.  Medications  sodium chloride 0.9 % bolus 1,000 mL (has no administration in time range)  magnesium oxide (MAG-OX) tablet 800 mg (has no administration in time range)  sodium chloride 0.9 % bolus 1,000 mL (1,000 mLs Intravenous New Bag/Given 12/06/21 2216)  morphine (PF) 2 MG/ML injection 2 mg (2 mg Intravenous Given 12/06/21 2237)  ondansetron (ZOFRAN) injection 4 mg (4 mg Intravenous Given 12/06/21 2237)    Vitals:   12/06/21 2134 12/06/21 2215  12/06/21 2230 12/06/21 2245  BP:  92/60 114/62 (!) 99/52  Pulse:   70 61 (!) 58  Resp:  18 18 17   Temp: 97.6 F (36.4 C)     TempSrc: Oral     SpO2:  94% 93% 92%  Weight:      Height:        Final diagnoses:  AKI (acute kidney injury) ([place])  Dehydration  Diarrhea, unspecified type    Admission/ observation were discussed with the admitting physician, patient and/or family and they are comfortable with the plan.          Final Clinical Impression(s) / ED Diagnoses Final diagnoses:  AKI (acute kidney injury) ([place])  Dehydration  Diarrhea, unspecified type    Rx / DC Orders ED Discharge Orders     None         Deno Etienne, DO 12/06/21 2323

## 2021-12-06 NOTE — ED Triage Notes (Signed)
Pt bib [facility] from [facility] facility after witnessed syncopal episode on toilet. Pt discharge from hospital Sat where she was admitted for covid. Pt reported having diarrhea since discharge. Pt reported 40z water intake daily. BP 70/40 initially upright and 114 palpated supine per EMS. Pt 90% RA and 96% 2L [place]

## 2021-12-07 ENCOUNTER — Encounter (HOSPITAL_COMMUNITY): Payer: Self-pay | Admitting: Internal Medicine

## 2021-12-07 DIAGNOSIS — E039 Hypothyroidism, unspecified: Secondary | ICD-10-CM

## 2021-12-07 DIAGNOSIS — Z887 Allergy status to serum and vaccine status: Secondary | ICD-10-CM | POA: Diagnosis not present

## 2021-12-07 DIAGNOSIS — E669 Obesity, unspecified: Secondary | ICD-10-CM | POA: Diagnosis present

## 2021-12-07 DIAGNOSIS — N179 Acute kidney failure, unspecified: Secondary | ICD-10-CM

## 2021-12-07 DIAGNOSIS — Z882 Allergy status to sulfonamides status: Secondary | ICD-10-CM | POA: Diagnosis not present

## 2021-12-07 DIAGNOSIS — I959 Hypotension, unspecified: Secondary | ICD-10-CM | POA: Diagnosis present

## 2021-12-07 DIAGNOSIS — U099 Post covid-19 condition, unspecified: Secondary | ICD-10-CM | POA: Diagnosis present

## 2021-12-07 DIAGNOSIS — A0472 Enterocolitis due to Clostridium difficile, not specified as recurrent: Secondary | ICD-10-CM | POA: Diagnosis present

## 2021-12-07 DIAGNOSIS — K5792 Diverticulitis of intestine, part unspecified, without perforation or abscess without bleeding: Secondary | ICD-10-CM | POA: Diagnosis not present

## 2021-12-07 DIAGNOSIS — E876 Hypokalemia: Secondary | ICD-10-CM | POA: Diagnosis present

## 2021-12-07 DIAGNOSIS — Z6838 Body mass index (BMI) 38.0-38.9, adult: Secondary | ICD-10-CM | POA: Diagnosis not present

## 2021-12-07 DIAGNOSIS — Z79890 Hormone replacement therapy (postmenopausal): Secondary | ICD-10-CM | POA: Diagnosis not present

## 2021-12-07 DIAGNOSIS — D649 Anemia, unspecified: Secondary | ICD-10-CM | POA: Diagnosis present

## 2021-12-07 DIAGNOSIS — E860 Dehydration: Secondary | ICD-10-CM | POA: Diagnosis not present

## 2021-12-07 DIAGNOSIS — J9811 Atelectasis: Secondary | ICD-10-CM | POA: Diagnosis present

## 2021-12-07 DIAGNOSIS — G4733 Obstructive sleep apnea (adult) (pediatric): Secondary | ICD-10-CM | POA: Diagnosis present

## 2021-12-07 DIAGNOSIS — E785 Hyperlipidemia, unspecified: Secondary | ICD-10-CM

## 2021-12-07 DIAGNOSIS — R197 Diarrhea, unspecified: Secondary | ICD-10-CM | POA: Insufficient documentation

## 2021-12-07 DIAGNOSIS — Z888 Allergy status to other drugs, medicaments and biological substances status: Secondary | ICD-10-CM | POA: Diagnosis not present

## 2021-12-07 DIAGNOSIS — K5732 Diverticulitis of large intestine without perforation or abscess without bleeding: Secondary | ICD-10-CM | POA: Diagnosis present

## 2021-12-07 DIAGNOSIS — F319 Bipolar disorder, unspecified: Secondary | ICD-10-CM | POA: Diagnosis present

## 2021-12-07 DIAGNOSIS — Z8616 Personal history of COVID-19: Secondary | ICD-10-CM | POA: Diagnosis not present

## 2021-12-07 DIAGNOSIS — Z79899 Other long term (current) drug therapy: Secondary | ICD-10-CM | POA: Diagnosis not present

## 2021-12-07 DIAGNOSIS — E871 Hypo-osmolality and hyponatremia: Secondary | ICD-10-CM | POA: Diagnosis not present

## 2021-12-07 DIAGNOSIS — I10 Essential (primary) hypertension: Secondary | ICD-10-CM | POA: Diagnosis present

## 2021-12-07 DIAGNOSIS — J45909 Unspecified asthma, uncomplicated: Secondary | ICD-10-CM | POA: Diagnosis present

## 2021-12-07 LAB — GASTROINTESTINAL PANEL BY PCR, STOOL (REPLACES STOOL CULTURE)

## 2021-12-07 LAB — CBC
HCT: 41 % (ref 36.0–46.0)
Hemoglobin: 13.1 g/dL (ref 12.0–15.0)
MCH: 29.1 pg (ref 26.0–34.0)
MCHC: 32 g/dL (ref 30.0–36.0)
MCV: 91.1 fL (ref 80.0–100.0)
Platelets: 300 10*3/uL (ref 150–400)
RBC: 4.5 MIL/uL (ref 3.87–5.11)
RDW: 13.9 % (ref 11.5–15.5)
WBC: 20.1 10*3/uL — ABNORMAL HIGH (ref 4.0–10.5)
nRBC: 0 % (ref 0.0–0.2)

## 2021-12-07 LAB — COMPREHENSIVE METABOLIC PANEL
ALT: 9 U/L (ref 0–44)
AST: 15 U/L (ref 15–41)
Albumin: 2.4 g/dL — ABNORMAL LOW (ref 3.5–5.0)
Alkaline Phosphatase: 91 U/L (ref 38–126)
Anion gap: 12 (ref 5–15)
BUN: 14 mg/dL (ref 8–23)
CO2: 19 mmol/L — ABNORMAL LOW (ref 22–32)
Calcium: 8.1 mg/dL — ABNORMAL LOW (ref 8.9–10.3)
Chloride: 105 mmol/L (ref 98–111)
Creatinine, Ser: 0.93 mg/dL (ref 0.44–1.00)
GFR, Estimated: >60 mL/min (ref >60)
Glucose, Bld: 102 mg/dL — ABNORMAL HIGH (ref 70–99)
Potassium: 4.2 mmol/L (ref 3.5–5.1)
Sodium: 136 mmol/L (ref 135–145)
Total Bilirubin: 0.3 mg/dL (ref 0.3–1.2)
Total Protein: 5.5 g/dL — ABNORMAL LOW (ref 6.5–8.1)

## 2021-12-07 LAB — URINALYSIS, ROUTINE W REFLEX MICROSCOPIC
Bilirubin Urine: NEGATIVE
Glucose, UA: NEGATIVE mg/dL
Hgb urine dipstick: NEGATIVE
Ketones, ur: NEGATIVE mg/dL
Leukocytes,Ua: NEGATIVE
Nitrite: NEGATIVE
Protein, ur: NEGATIVE mg/dL
Specific Gravity, Urine: 1.02 (ref 1.005–1.030)
pH: 5.5 (ref 5.0–8.0)

## 2021-12-07 LAB — LACTIC ACID, PLASMA
Lactic Acid, Venous: 1.2 mmol/L (ref 0.5–1.9)
Lactic Acid, Venous: 1.7 mmol/L (ref 0.5–1.9)

## 2021-12-07 MED ORDER — LEVOTHYROXINE SODIUM 50 MCG PO TABS
50.0000 ug | ORAL_TABLET | Freq: Every day | ORAL | Status: DC
  Administered 2021-12-07 – 2021-12-18 (×12): 50 ug via ORAL
  Filled 2021-12-07 (×3): qty 1
  Filled 2021-12-07: qty 2
  Filled 2021-12-07 (×8): qty 1

## 2021-12-07 MED ORDER — MELATONIN 5 MG PO TABS
5.0000 mg | ORAL_TABLET | Freq: Every day | ORAL | Status: DC
  Administered 2021-12-07 – 2021-12-17 (×11): 5 mg via ORAL
  Filled 2021-12-07 (×11): qty 1

## 2021-12-07 MED ORDER — ATORVASTATIN CALCIUM 80 MG PO TABS
80.0000 mg | ORAL_TABLET | Freq: Every day | ORAL | Status: DC
  Administered 2021-12-07: 80 mg via ORAL
  Filled 2021-12-07: qty 1

## 2021-12-07 MED ORDER — LACTATED RINGERS IV SOLN: INTRAVENOUS | Status: AC

## 2021-12-07 MED ORDER — TRAMADOL HCL 50 MG PO TABS
50.0000 mg | ORAL_TABLET | Freq: Four times a day (QID) | ORAL | Status: DC | PRN
  Administered 2021-12-08 – 2021-12-15 (×4): 50 mg via ORAL
  Filled 2021-12-07 (×4): qty 1

## 2021-12-07 MED ORDER — ENOXAPARIN SODIUM 40 MG/0.4ML IJ SOSY
40.0000 mg | PREFILLED_SYRINGE | INTRAMUSCULAR | Status: DC
  Administered 2021-12-07 – 2021-12-18 (×12): 40 mg via SUBCUTANEOUS
  Filled 2021-12-07 (×12): qty 0.4

## 2021-12-07 MED ORDER — ACETAMINOPHEN 325 MG PO TABS
650.0000 mg | ORAL_TABLET | Freq: Four times a day (QID) | ORAL | Status: DC | PRN
  Administered 2021-12-07 – 2021-12-08 (×2): 650 mg via ORAL
  Filled 2021-12-07 (×2): qty 2

## 2021-12-07 MED ORDER — ALBUTEROL SULFATE (2.5 MG/3ML) 0.083% IN NEBU
3.0000 mL | INHALATION_SOLUTION | Freq: Four times a day (QID) | RESPIRATORY_TRACT | Status: DC | PRN
  Administered 2021-12-08 – 2021-12-09 (×3): 3 mL via RESPIRATORY_TRACT
  Filled 2021-12-07 (×3): qty 3

## 2021-12-07 MED ORDER — ESCITALOPRAM OXALATE 10 MG PO TABS
10.0000 mg | ORAL_TABLET | Freq: Every day | ORAL | Status: DC
  Administered 2021-12-07 – 2021-12-18 (×12): 10 mg via ORAL
  Filled 2021-12-07 (×12): qty 1

## 2021-12-07 MED ORDER — OXYCODONE HCL 5 MG PO TABS
5.0000 mg | ORAL_TABLET | ORAL | Status: DC | PRN
  Administered 2021-12-07 – 2021-12-10 (×3): 10 mg via ORAL
  Administered 2021-12-10: 5 mg via ORAL
  Filled 2021-12-07 (×2): qty 2
  Filled 2021-12-07: qty 1
  Filled 2021-12-07: qty 2

## 2021-12-07 MED ORDER — BUPROPION HCL ER (XL) 150 MG PO TB24
150.0000 mg | ORAL_TABLET | Freq: Every day | ORAL | Status: DC
  Administered 2021-12-07 – 2021-12-18 (×12): 150 mg via ORAL
  Filled 2021-12-07 (×12): qty 1

## 2021-12-07 MED ORDER — PIPERACILLIN-TAZOBACTAM 3.375 G IVPB
3.3750 g | Freq: Three times a day (TID) | INTRAVENOUS | Status: DC
  Administered 2021-12-07 – 2021-12-09 (×7): 3.375 g via INTRAVENOUS
  Filled 2021-12-07 (×7): qty 50

## 2021-12-07 MED ORDER — FLUTICASONE PROPIONATE 50 MCG/ACT NA SUSP
2.0000 | Freq: Every day | NASAL | Status: DC
  Administered 2021-12-09 – 2021-12-18 (×10): 2 via NASAL
  Filled 2021-12-07 (×2): qty 16

## 2021-12-07 MED ORDER — HYDRALAZINE HCL 20 MG/ML IJ SOLN
10.0000 mg | INTRAMUSCULAR | Status: DC | PRN
Start: 2021-12-07 — End: 2021-12-10

## 2021-12-07 MED ORDER — MONTELUKAST SODIUM 10 MG PO TABS
10.0000 mg | ORAL_TABLET | Freq: Every day | ORAL | Status: DC
  Administered 2021-12-07 – 2021-12-18 (×12): 10 mg via ORAL
  Filled 2021-12-07 (×12): qty 1

## 2021-12-07 MED ORDER — ASPIRIN 325 MG PO TABS
325.0000 mg | ORAL_TABLET | Freq: Every day | ORAL | Status: DC
  Administered 2021-12-07 – 2021-12-08 (×2): 325 mg via ORAL
  Filled 2021-12-07 (×2): qty 1

## 2021-12-07 MED ORDER — BUDESONIDE 0.25 MG/2ML IN SUSP
0.2500 mg | Freq: Two times a day (BID) | RESPIRATORY_TRACT | Status: DC
  Administered 2021-12-08 – 2021-12-18 (×21): 0.25 mg via RESPIRATORY_TRACT
  Filled 2021-12-07 (×23): qty 2

## 2021-12-07 MED ORDER — LACTATED RINGERS IV BOLUS
500.0000 mL | Freq: Once | INTRAVENOUS | Status: AC
  Administered 2021-12-07: 500 mL via INTRAVENOUS

## 2021-12-07 MED ORDER — MAGNESIUM SULFATE 2 GM/50ML IV SOLN
2.0000 g | Freq: Once | INTRAVENOUS | Status: AC
  Administered 2021-12-07: 2 g via INTRAVENOUS
  Filled 2021-12-07: qty 50

## 2021-12-07 MED ORDER — PIPERACILLIN-TAZOBACTAM 3.375 G IVPB 30 MIN
3.3750 g | Freq: Once | INTRAVENOUS | Status: AC
Start: 2021-12-07 — End: 2021-12-07
  Administered 2021-12-07: 3.375 g via INTRAVENOUS
  Filled 2021-12-07: qty 50

## 2021-12-07 MED ORDER — GABAPENTIN 100 MG PO CAPS
100.0000 mg | ORAL_CAPSULE | Freq: Three times a day (TID) | ORAL | Status: DC
  Administered 2021-12-07 – 2021-12-18 (×33): 100 mg via ORAL
  Filled 2021-12-07 (×34): qty 1

## 2021-12-07 MED ORDER — LORAZEPAM 1 MG PO TABS
1.0000 mg | ORAL_TABLET | Freq: Every day | ORAL | Status: DC
  Administered 2021-12-07 – 2021-12-17 (×11): 1 mg via ORAL
  Filled 2021-12-07 (×11): qty 1

## 2021-12-07 MED ORDER — ACETAMINOPHEN 650 MG RE SUPP: 650.0000 mg | Freq: Four times a day (QID) | RECTAL | Status: DC | PRN

## 2021-12-07 MED ORDER — VIBEGRON 75 MG PO TABS: 75.0000 mg | ORAL_TABLET | Freq: Every day | ORAL | Status: DC

## 2021-12-07 MED ORDER — FLUTICASONE PROPIONATE HFA 44 MCG/ACT IN AERO: 2.0000 | INHALATION_SPRAY | Freq: Every day | RESPIRATORY_TRACT | Status: DC

## 2021-12-07 MED ORDER — ONDANSETRON HCL 4 MG/2ML IJ SOLN
4.0000 mg | Freq: Four times a day (QID) | INTRAMUSCULAR | Status: DC | PRN
  Administered 2021-12-07 – 2021-12-17 (×15): 4 mg via INTRAVENOUS
  Filled 2021-12-07 (×16): qty 2

## 2021-12-07 NOTE — H&P (Addendum)
History and Physical    [name] [id number] DOB: 1945-11-24 DOA: 12/06/2021  PCP: Wenda Low, MD  Patient coming from: Independent living facility.  Chief Complaint: Diarrhea and almost passed out.  HPI: Courtney [name] is a 76 y.o. female with history of hypothyroidism, sleep apnea, hyperlipidemia, hypertension who was recently admitted and discharged after being treated for diarrhea related to COVID infection on November 24, 2021 started developing diarrhea again last 2 days which was watery multiple episodes with lower abdominal quadrant pain.  Denies any fever chills had some nausea.  Patient also was feeling weak and stated that she almost passed out.  But did not lose consciousness.  Denies chest pain or shortness of breath.    ED Course: In the ER patient is found to be hypotensive with a systolic in the [id number] which improved with fluid bolus.  Labs show acute renal failure with creatinine of 1.2 was normal at around 0.7 on November 24, 2021.  Patient also had low magnesium 1.6.  WBC count is 15.7 CT scan of the abdomen pelvis done shows features concerning for mild sigmoid diverticulitis and also colitis involving the cecum up to the right hepatic flexure.  Patient was started on empiric antibiotics fluids admitted for further observation.  Review of Systems: As per HPI, rest all negative.   Past Medical History:  Diagnosis Date   Anemia    Arthritis    Bilateral Wrists   Asthma    Bipolar disorder ([place])    Diverticulitis    Hiatal hernia    History of kidney stones 2006   Hypertension    Hypothyroidism (acquired) 04/02/2021   Kidney stone    Mitral valve prolapse    OSA (obstructive sleep apnea) 04/02/2021   Panic attack    PONV (postoperative nausea and vomiting)    Pulmonary embolus (HCC)    Stroke [facility])    Vertigo     Past Surgical History:  Procedure Laterality Date   ABDOMINAL HYSTERECTOMY     ANTERIOR CERVICAL DECOMP/DISCECTOMY FUSION N/A 03/28/2021    Procedure: Cervical three-four Cervical four-five Cervical five-six Anterior cervical decompression/discectomy/fusion/interbody prosthesis/plate/screws;  Surgeon: Newman Pies, MD;  Location: [facility];  Service: Neurosurgery;  Laterality: N/A;   BACK SURGERY     BREAST REDUCTION SURGERY     CHOLECYSTECTOMY     EYE SURGERY Bilateral 2000   Cataract   JOINT REPLACEMENT Bilateral    Knee replacement   KNEE SURGERY     KNEE SURGERY     TOE SURGERY     TONSILLECTOMY       reports that she has never smoked. She has never used smokeless tobacco. She reports that she does not currently use alcohol. She reports that she does not currently use drugs.  Allergies  Allergen Reactions   Tetanus-Diphtheria Toxoids Td Anaphylaxis   Meloxicam Nausea And Vomiting   Phentermine Nausea And Vomiting   Sulfa Antibiotics Diarrhea and Nausea And Vomiting   Zithromax [Azithromycin] Diarrhea and Nausea And Vomiting    History reviewed. No pertinent family history.  Prior to Admission medications   Medication Sig Start Date End Date Taking? Authorizing Provider  acetaminophen (TYLENOL) 325 MG tablet Take 650 mg by mouth every 6 (six) hours as needed for moderate pain.   Yes [provider]  albuterol (VENTOLIN HFA) 108 (90 Base) MCG/ACT inhaler Inhale 1 puff into the lungs every 6 (six) hours as needed for wheezing or shortness of breath. 02/22/21  Yes [provider]  aspirin 325 MG tablet Take 325 mg by mouth daily.   Yes [provider]  atorvastatin (LIPITOR) 80 MG tablet Take 80 mg by mouth daily. 02/21/21  Yes [provider]  benzonatate (TESSALON) 200 MG capsule Take 200 mg by mouth 3 (three) times daily as needed for cough. 12/06/21  Yes [provider]  Biotin 5000 MCG CAPS Take 5,000 mcg by mouth daily.   Yes [provider]  buPROPion (WELLBUTRIN XL) 150 MG 24 hr tablet Take 150 mg by mouth daily. 05/27/16  Yes [provider]   Cholecalciferol (VITAMIN D3) 25 MCG (1000 UT) CAPS Take 1,000 Units by mouth daily.   Yes [provider]  cyanocobalamin (,VITAMIN B-12,) 1000 MCG/ML injection Inject 1,000 mcg into the muscle every 30 (thirty) days. 02/23/21  Yes [provider]  cyclobenzaprine (FLEXERIL) 10 MG tablet Take 1 tablet (10 mg total) by mouth 3 (three) times daily as needed for muscle spasms. 04/04/21  Yes Viona Gilmore D, NP  diclofenac Sodium (VOLTAREN) 1 % GEL Apply 2 g topically at bedtime. Legs,knees,feet,wrist and shoulder 12/02/20  Yes [provider]  docusate sodium (COLACE) 100 MG capsule Take 200 mg by mouth daily.   Yes [provider]  escitalopram (LEXAPRO) 10 MG tablet Take 10 mg by mouth daily. 11/24/20  Yes [provider]  FLOVENT HFA 44 MCG/ACT inhaler Inhale 2 puffs into the lungs in the morning and at bedtime. 02/22/21  Yes [provider]  fluticasone (FLONASE) 50 MCG/ACT nasal spray Place 2 sprays into both nostrils in the morning and at bedtime. 10/29/21  Yes [provider]  gabapentin (NEURONTIN) 100 MG capsule Take 100 mg by mouth in the morning, at noon, and at bedtime. 02/13/21  Yes [provider]  levothyroxine (SYNTHROID) 50 MCG tablet Take 50 mcg by mouth daily before breakfast. 02/09/21  Yes [provider]  LORazepam (ATIVAN) 1 MG tablet Take 1 mg by mouth at bedtime. 11/06/21  Yes [provider]  magnesium oxide (MAG-OX) 400 MG tablet Take 400 mg by mouth daily.   Yes [provider]  meclizine (ANTIVERT) 25 MG tablet Take 25 mg by mouth 3 (three) times daily as needed for dizziness.   Yes [provider]  melatonin 5 MG TABS Take 5 mg by mouth at bedtime.   Yes [provider]  montelukast (SINGULAIR) 10 MG tablet Take 10 mg by mouth daily. 02/13/21  Yes [provider]  multivitamin-lutein (OCUVITE-LUTEIN) CAPS capsule Take 1 capsule by mouth at bedtime.   Yes  [provider]  NON FORMULARY CPAP at bedtime   Yes [provider]  olmesartan (BENICAR) 40 MG tablet Take 40 mg by mouth daily. 02/21/21  Yes [provider]  ondansetron (ZOFRAN) 4 MG tablet Take 4 mg by mouth every 8 (eight) hours as needed for nausea or vomiting.   Yes [provider]  pantoprazole (PROTONIX) 40 MG tablet Take 40 mg by mouth daily. 02/13/21  Yes [provider]  psyllium (REGULOID) 0.52 g capsule Take 1.04 g by mouth daily.   Yes [provider]  verapamil (CALAN-SR) 120 MG CR tablet Take 360 mg by mouth daily. 02/13/21  Yes [provider]  Vibegron (GEMTESA) 75 MG TABS Take 75 mg by mouth at bedtime.   Yes [provider]  OZEMPIC, 0.25 OR 0.5 MG/DOSE, 2 MG/1.5ML SOPN Inject 0.25 mg into the skin every Tuesday. 02/27/21   [provider]  Physical Exam: Constitutional: Moderately built and nourished. Vitals:   12/06/21 2330 12/07/21 0035 12/07/21 0045 12/07/21 0100  BP: 109/76 118/63 119/71 115/71  Pulse: 62 66 63 66  Resp: (!) 22 20 (!) 22 14  Temp:      TempSrc:      SpO2: 95% 94% 91% 91%  Weight:      Height:       Eyes: Anicteric no pallor. ENMT: No discharge from the ears eyes nose and mouth. Neck: No mass felt.  No neck rigidity. Respiratory: No rhonchi or crepitations. Cardiovascular: S1-S2 heard. Abdomen: Soft nontender bowel sound present. Musculoskeletal: No edema. Skin: No rash. Neurologic: Alert awake oriented time place and person.  Moves all extremities. Psychiatric: Appears normal.  Normal affect.   Labs on Admission: I have personally reviewed following labs and imaging studies  CBC: Recent Labs  Lab 12/06/21 2213  WBC 15.7*  NEUTROABS 12.0*  HGB 11.8*  HCT 37.2  MCV 90.7  PLT 0000000   Basic Metabolic Panel: Recent Labs  Lab 12/06/21 2213  NA 134*  K 3.7  CL 100  CO2 25  GLUCOSE 121*  BUN 15  CREATININE 1.26*  CALCIUM 8.3*  MG 1.6*    GFR: Estimated Creatinine Clearance: 41.4 mL/min (A) (by C-G formula based on SCr of 1.26 mg/dL (H)). Liver Function Tests: Recent Labs  Lab 12/06/21 2213  AST 15  ALT 11  ALKPHOS 87  BILITOT 0.4  PROT 5.3*  ALBUMIN 2.4*   Recent Labs  Lab 12/06/21 2213  LIPASE 21   No results for input(s): AMMONIA in the last 168 hours. Coagulation Profile: No results for input(s): INR, PROTIME in the last 168 hours. Cardiac Enzymes: No results for input(s): CKTOTAL, CKMB, CKMBINDEX, TROPONINI in the last 168 hours. BNP (last 3 results) No results for input(s): PROBNP in the last 8760 hours. HbA1C: No results for input(s): HGBA1C in the last 72 hours. CBG: No results for input(s): GLUCAP in the last 168 hours. Lipid Profile: No results for input(s): CHOL, HDL, LDLCALC, TRIG, CHOLHDL, LDLDIRECT in the last 72 hours. Thyroid Function Tests: No results for input(s): TSH, T4TOTAL, FREET4, T3FREE, THYROIDAB in the last 72 hours. Anemia Panel: No results for input(s): VITAMINB12, FOLATE, FERRITIN, TIBC, IRON, RETICCTPCT in the last 72 hours. Urine analysis:    Component Value Date/Time   COLORURINE YELLOW 12/07/2021 0110   APPEARANCEUR CLEAR 12/07/2021 0110   LABSPEC 1.020 12/07/2021 0110   PHURINE 5.5 12/07/2021 0110   GLUCOSEU NEGATIVE 12/07/2021 0110   HGBUR NEGATIVE 12/07/2021 0110   BILIRUBINUR NEGATIVE 12/07/2021 0110   KETONESUR NEGATIVE 12/07/2021 0110   PROTEINUR NEGATIVE 12/07/2021 0110   NITRITE NEGATIVE 12/07/2021 0110   LEUKOCYTESUR NEGATIVE 12/07/2021 0110   Sepsis Labs: @LABRCNTIP (procalcitonin:4,lacticidven:4) )No results found for this or any previous visit (from the past 240 hour(s)).   Radiological Exams on Admission: CT ABDOMEN PELVIS W CONTRAST  Result Date: 12/07/2021 CLINICAL DATA:  Abdominal pain, diarrhea EXAM: CT ABDOMEN AND PELVIS WITH CONTRAST TECHNIQUE: Multidetector CT imaging of the abdomen and pelvis was performed using the standard protocol  following bolus administration of intravenous contrast. RADIATION DOSE REDUCTION: This exam was performed according to the departmental dose-optimization program which includes automated exposure control, adjustment of the mA and/or kV according to patient size and/or use of iterative reconstruction technique. CONTRAST:  14mL OMNIPAQUE IOHEXOL 300 MG/ML  SOLN COMPARISON:  CT abdomen/pelvis dated 11/18/2021 FINDINGS: Lower chest: Eventration of the right hemidiaphragm. Lung bases are clear. Hepatobiliary: Liver is  within normal limits. Status post cholecystectomy. No intrahepatic or extrahepatic ductal dilatation. Pancreas: Within normal limits. Spleen: Within normal limits Adrenals/Urinary Tract: Adrenal glands are within normal limits. 8 mm left upper pole renal cyst (series 3/image 28). Right kidney is within normal limits. No hydronephrosis. Bladder is within normal limits. Stomach/Bowel: Stomach is within normal limits. No evidence of bowel obstruction. Appendix is not discretely visualized. Right colonic wall thickening extending from the cecum to the hepatic flexure (series 3/image 50), suggesting infectious/inflammatory colitis. This is new from the prior. Left colonic diverticulosis with inflammatory changes along the sigmoid colon in the left pelvis (series 3/image 67), suggesting mild sigmoid diverticulitis. This was present on the prior. No drainable fluid collection/abscess. No free air to suggest macroscopic perforation. Vascular/Lymphatic: No evidence of abdominal aortic aneurysm. Atherosclerotic calcifications of the abdominal aorta and branch vessels. No suspicious abdominopelvic lymphadenopathy. Reproductive: Status post hysterectomy. No adnexal masses. Other: Trace pelvic ascites. Musculoskeletal: Status post PLIF at L4-5. Degenerative changes of the visualized thoracolumbar spine. IMPRESSION: Mild sigmoid diverticulitis with trace pelvic ascites. No drainable fluid collection/abscess. No free air  to suggest macroscopic perforation. This was present on the prior. Additional right colonic wall thickening extending from the cecum to the hepatic flexure, suggesting infectious/inflammatory colitis. This is new from the prior. Electronically Signed   By: Julian Hy M.D.   On: 12/07/2021 00:09    EKG: Independently reviewed.  Normal sinus rhythm.  QTc of 454 ms.  Assessment/Plan Principal Problem:   ARF (acute renal failure) (HCC) Active Problems:   Bipolar disorder (HCC)   Hypertension   Dyslipidemia   Hypothyroidism (acquired)   OSA (obstructive sleep apnea)   Diverticulitis    Diarrhea and abdominal discomfort with CT scan showing mild sigmoid diverticulitis and colitis involving the cecum to the right hepatic flexure for which patient has been empirically started on fluids we will check stool studies and also since it is involving segments we will check lactic acid to rule out any ischemic component.  Continue with hydration. Acute renal failure likely from diarrhea and hypotension for which patient is receiving fluids and is responding to the fluids.  Hold antihypertensives including ARB. Near syncopal episode likely from hypotension.  Patient responding to fluid. Hypothyroidism on Synthroid. Sleep apnea on CPAP at bedtime. Hyperlipidemia on statins. Depression on Wellbutrin Lexapro and Ativan. Anemia follow CBC. Recent COVID infection so COVID test was not repeated. History of hypertension presently hypotensive at presentation so holding antihypertensives. Hypomagnesemia likely from diarrhea replace and recheck.   DVT prophylaxis: Lovenox. Code Status: Full code. Family Communication: Family at the bedside. Disposition Plan: Back to facility when stable. Consults called: None. Admission status: Observation.   Rise Patience MD [facility] Pager [phone].  If 7PM-7AM, please contact night-coverage [email or web address] Password [facility]  12/07/2021, 2:39  AM

## 2021-12-07 NOTE — Progress Notes (Signed)
NEW ADMISSION NOTE New Admission Note:   Arrival Method: stretcher Mental Orientation: A&OX4 Telemetry: 5M10 Assessment: Completed Skin: intact bruising on bilateral arms, abdomen IV: LFA Pain:7/10 Tubes:  rectal tube Safety Measures: Safety Fall Prevention Plan has been given, discussed and signed Admission: Completed [facility] Orientation: Patient has been orientated to the room, unit and staff.  Family: husband at bedside  Orders have been reviewed and implemented. Will continue to monitor the patient. Call light has been placed within reach and bed alarm has been activated.   Timesha Cervantez S Alydia Gosser, RN

## 2021-12-07 NOTE — Progress Notes (Signed)
Courtney [name]  [id number] DOB: [birth year] DOA: 12/06/2021 PCP: Wenda Low, MD    Brief Narrative:  76 yo w/ a hx of hypothyroidism, OSA, HLD, and HTN who as recently admitted w/ CoViD diarrhea (discharged 11/24/21) who suffered the recurrence of diarrhea 2 days prior to tthis admit, prompting her return to the ED. In the ED she was hypotensive w/ SBP in the 70s. She was also in AKI, w/ hypomagnesemia. CT abdom/pelvis noted diverticulitis and colitis.   Consultants:  None  Code Status: FULL CODE  DVT prophylaxis: Lovenox   Interim Hx: Patient was examined and interviewed by one of my partners earlier today.  Assessment & Plan:  Sigmoid diverticulitis - colitis of the cecum > hepatic flexure  Continue empiric antibiotic therapy  Acute kidney injury Quickly resolved with volume resuscitation  Hypomagnesemia  Supplement and follow  Hypothyroidism Continue usual hormone replacement  HLD Hold medical therapy until oral intake more consistent  Depression Continue usual medical care  Anemia  Follow hemoglobin  Recent CoviD   HTN    Family Communication:  Disposition: From ALF - dispo pending PT/OT and clinical improvement   Objective: Blood pressure 128/62, pulse 90, temperature 97.6 F (36.4 C), temperature source Oral, resp. rate (!) 24, height 5\' 2"  (1.575 m), weight 89.8 kg, SpO2 91 %.  Intake/Output Summary (Last 24 hours) at 12/07/2021 0908 Last data filed at 12/07/2021 0456 Gross per 24 hour  Intake 2095.18 ml  Output 250 ml  Net 1845.18 ml   Filed Weights   12/06/21 2131  Weight: 89.8 kg    Examination: Patient examined by one of my partners earlier today -she is seen for follow-up exam and is not in extremis with soft abdomen  CBC: Recent Labs  Lab 12/06/21 2213  WBC 15.7*  NEUTROABS 12.0*  HGB 11.8*  HCT 37.2  MCV 90.7  PLT 0000000   Basic Metabolic Panel: Recent Labs  Lab 12/06/21 2213 12/07/21 0431  NA 134* 136  K 3.7 4.2  CL  100 105  CO2 25 19*  GLUCOSE 121* 102*  BUN 15 14  CREATININE 1.26* 0.93  CALCIUM 8.3* 8.1*  MG 1.6*  --    GFR: Estimated Creatinine Clearance: 56.1 mL/min (by C-G formula based on SCr of 0.93 mg/dL).  Liver Function Tests: Recent Labs  Lab 12/06/21 2213 12/07/21 0431  AST 15 15  ALT 11 9  ALKPHOS 87 91  BILITOT 0.4 0.3  PROT 5.3* 5.5*  ALBUMIN 2.4* 2.4*   Recent Labs  Lab 12/06/21 2213  LIPASE 21   Scheduled Meds:  aspirin  325 mg Oral Daily   atorvastatin  80 mg Oral Daily   budesonide (PULMICORT) nebulizer solution  0.25 mg Nebulization BID   buPROPion  150 mg Oral Daily   enoxaparin (LOVENOX) injection  40 mg Subcutaneous Q24H   escitalopram  10 mg Oral Daily   fluticasone  2 spray Each Nare Daily   gabapentin  100 mg Oral TID   levothyroxine  50 mcg Oral Q0600   LORazepam  1 mg Oral QHS   melatonin  5 mg Oral QHS   montelukast  10 mg Oral Daily   Continuous Infusions:  lactated ringers 125 mL/hr at 12/07/21 0457   piperacillin-tazobactam (ZOSYN)  IV       LOS: 0 days   Cherene Altes, MD [facility] Office  [phone] Pager - Text Page per Amion  If 7PM-7AM, please contact night-coverage per Amion 12/07/2021, 9:08 AM

## 2021-12-07 NOTE — ED Notes (Signed)
Message sent to MD requesting order for Flexiseal. Pt having liquid stools every 20 minutes, unable to tell staff prior to going.

## 2021-12-07 NOTE — Progress Notes (Signed)
Pharmacy Antibiotic Note  Courtney [name] is a 76 y.o. female admitted on 12/06/2021 with  infectious/inflammatory colitis .  Pharmacy has been consulted for Zosyn dosing.  Plan: Zosyn 3.375g IV q8h (4 hour infusion).  Height: 5\' 2"  (157.5 cm) Weight: 89.8 kg (198 lb) IBW/kg (Calculated) : 50.1  Temp (24hrs), Avg:97.6 F (36.4 C), Min:97.6 F (36.4 C), Max:97.6 F (36.4 C)  Recent Labs  Lab 12/06/21 2213  WBC 15.7*  CREATININE 1.26*    Estimated Creatinine Clearance: 41.4 mL/min (A) (by C-G formula based on SCr of 1.26 mg/dL (H)).    Allergies  Allergen Reactions   Tetanus-Diphtheria Toxoids Td Anaphylaxis   Meloxicam Nausea And Vomiting   Phentermine Nausea And Vomiting   Sulfa Antibiotics Diarrhea and Nausea And Vomiting   Zithromax [Azithromycin] Diarrhea and Nausea And Vomiting    Thank you for allowing pharmacy to be a part of this patients care.  Wynona Neat, PharmD, BCPS  12/07/2021 2:43 AM

## 2021-12-07 NOTE — Progress Notes (Signed)
Called to place pt on CPAP. Pt has Home unit at bedside but needed o2 bled in. Placed pt on 4L bled in per previous requirement of 4L [place].

## 2021-12-08 DIAGNOSIS — R197 Diarrhea, unspecified: Secondary | ICD-10-CM | POA: Diagnosis not present

## 2021-12-08 DIAGNOSIS — E860 Dehydration: Secondary | ICD-10-CM | POA: Diagnosis not present

## 2021-12-08 DIAGNOSIS — N179 Acute kidney failure, unspecified: Secondary | ICD-10-CM | POA: Diagnosis not present

## 2021-12-08 DIAGNOSIS — K5792 Diverticulitis of intestine, part unspecified, without perforation or abscess without bleeding: Secondary | ICD-10-CM | POA: Diagnosis not present

## 2021-12-08 LAB — COMPREHENSIVE METABOLIC PANEL
ALT: 9 U/L (ref 0–44)
AST: 13 U/L — ABNORMAL LOW (ref 15–41)
Albumin: 2 g/dL — ABNORMAL LOW (ref 3.5–5.0)
Alkaline Phosphatase: 87 U/L (ref 38–126)
Anion gap: 9 (ref 5–15)
BUN: 15 mg/dL (ref 8–23)
CO2: 25 mmol/L (ref 22–32)
Calcium: 8.4 mg/dL — ABNORMAL LOW (ref 8.9–10.3)
Chloride: 98 mmol/L (ref 98–111)
Creatinine, Ser: 1.3 mg/dL — ABNORMAL HIGH (ref 0.44–1.00)
GFR, Estimated: 43 mL/min — ABNORMAL LOW (ref >60)
Glucose, Bld: 123 mg/dL — ABNORMAL HIGH (ref 70–99)
Potassium: 4.2 mmol/L (ref 3.5–5.1)
Sodium: 132 mmol/L — ABNORMAL LOW (ref 135–145)
Total Bilirubin: 0.5 mg/dL (ref 0.3–1.2)
Total Protein: 5 g/dL — ABNORMAL LOW (ref 6.5–8.1)

## 2021-12-08 LAB — CBC
HCT: 40.8 % (ref 36.0–46.0)
Hemoglobin: 13.1 g/dL (ref 12.0–15.0)
MCH: 29.3 pg (ref 26.0–34.0)
MCHC: 32.1 g/dL (ref 30.0–36.0)
MCV: 91.3 fL (ref 80.0–100.0)
Platelets: 300 10*3/uL (ref 150–400)
RBC: 4.47 MIL/uL (ref 3.87–5.11)
RDW: 14.1 % (ref 11.5–15.5)
WBC: 22.6 10*3/uL — ABNORMAL HIGH (ref 4.0–10.5)
nRBC: 0 % (ref 0.0–0.2)

## 2021-12-08 LAB — MAGNESIUM: Magnesium: 1.9 mg/dL (ref 1.7–2.4)

## 2021-12-08 LAB — LACTATE DEHYDROGENASE: LDH: 113 U/L (ref 98–192)

## 2021-12-08 MED ORDER — BUDESONIDE 0.25 MG/2ML IN SUSP: 0.2500 mg | Freq: Two times a day (BID) | RESPIRATORY_TRACT | Status: DC

## 2021-12-08 MED ORDER — LACTATED RINGERS IV SOLN: INTRAVENOUS | Status: DC

## 2021-12-08 MED ORDER — MORPHINE SULFATE (PF) 2 MG/ML IV SOLN
2.0000 mg | INTRAVENOUS | Status: DC | PRN
  Administered 2021-12-10: 2 mg via INTRAVENOUS
  Filled 2021-12-08: qty 1

## 2021-12-08 MED ORDER — LACTATED RINGERS IV BOLUS
500.0000 mL | Freq: Once | INTRAVENOUS | Status: AC
  Administered 2021-12-08: 500 mL via INTRAVENOUS

## 2021-12-08 NOTE — Progress Notes (Signed)
Courtney [name]  [id number] DOB: [birth year] DOA: 12/06/2021 PCP: Wenda Low, MD    Brief Narrative:  76 yo w/ a hx of hypothyroidism, OSA, HLD, and HTN who as recently admitted w/ CoViD diarrhea (discharged 11/24/21) who suffered the recurrence of diarrhea 2 days prior to this admit, prompting her return to the ED. In the ED she was hypotensive w/ SBP in the 70s. She was also in AKI, w/ hypomagnesemia. CT abdom/pelvis noted diverticulitis and colitis.   Consultants:  None  Code Status: FULL CODE  DVT prophylaxis: Lovenox   Interim Hx: Afebrile.  Blood pressure marginal and at times dipping as low as 77 systolic.  Some tachycardia with heart rate 108-127.  Continues to complain of significant diffuse abdominal pain which is not worse but not yet eating better.  Denies chest pain or shortness of breath.  Some nausea with an episode of vomiting this morning.  Assessment & Plan:  Sigmoid diverticulitis - colitis of the cecum > hepatic flexure  Continue empiric antibiotic therapy -if pain not improved tomorrow consider repeat abdominal imaging -no rebound or worrisome findings on exam other than persisting pain -abdomen is soft  Hypotension Increase volume resuscitation -likely consequence of dehydration plus ongoing infection  Acute kidney injury Recurring with episodes of hypotension -increase volume resuscitation and follow trend  Hypomagnesemia  Corrected with supplementation  Hypothyroidism Continue usual hormone replacement  HLD Hold medical therapy until oral intake more consistent  Depression Continue usual medical care  Anemia  Follow hemoglobin  Recent CoviD   HTN Not an active issue at this time   Family Communication: Discussed care with husband of 71 years at bedside Disposition: From ALF - dispo pending PT/OT and clinical improvement   Objective: Blood pressure (!) 77/58, pulse (!) 108, temperature 97.7 F (36.5 C), resp. rate 18, height 5\' 2"   (1.575 m), weight 95 kg, SpO2 98 %.  Intake/Output Summary (Last 24 hours) at 12/08/2021 1043 Last data filed at 12/08/2021 0600 Gross per 24 hour  Intake 3089.7 ml  Output 0 ml  Net 3089.7 ml    Filed Weights   12/06/21 2131 12/07/21 1323  Weight: 89.8 kg 95 kg    Examination: General: No acute respiratory distress Lungs: Clear to auscultation bilaterally Cardiovascular: Regular rate and rhythm without murmur gallop or rub normal S1 and S2 Abdomen: Overweight, soft, diffusely tender more prominently in bilateral lower quadrants, no mass, bowel sounds hypoactive but present Extremities: No significant edema bilateral lower extremities  CBC: Recent Labs  Lab 12/06/21 2213 12/07/21 1556 12/08/21 0108  WBC 15.7* 20.1* 22.6*  NEUTROABS 12.0*  --   --   HGB 11.8* 13.1 13.1  HCT 37.2 41.0 40.8  MCV 90.7 91.1 91.3  PLT 293 300 [id number]    Basic Metabolic Panel: Recent Labs  Lab 12/06/21 2213 12/07/21 0431 12/08/21 0108  NA 134* 136 132*  K 3.7 4.2 4.2  CL 100 105 98  CO2 25 19* 25  GLUCOSE 121* 102* 123*  BUN 15 14 15   CREATININE 1.26* 0.93 1.30*  CALCIUM 8.3* 8.1* 8.4*  MG 1.6*  --  1.9    GFR: Estimated Creatinine Clearance: 41.4 mL/min (A) (by C-G formula based on SCr of 1.3 mg/dL (H)).  Liver Function Tests: Recent Labs  Lab 12/06/21 2213 12/07/21 0431 12/08/21 0108  AST 15 15 13*  ALT 11 9 9   ALKPHOS 87 91 87  BILITOT 0.4 0.3 0.5  PROT 5.3* 5.5* 5.0*  ALBUMIN 2.4* 2.4* 2.0*  Recent Labs  Lab 12/06/21 2213  LIPASE 21    Scheduled Meds:  aspirin  325 mg Oral Daily   budesonide (PULMICORT) nebulizer solution  0.25 mg Nebulization BID   buPROPion  150 mg Oral Daily   enoxaparin (LOVENOX) injection  40 mg Subcutaneous Q24H   escitalopram  10 mg Oral Daily   fluticasone  2 spray Each Nare Daily   gabapentin  100 mg Oral TID   levothyroxine  50 mcg Oral Q0600   LORazepam  1 mg Oral QHS   melatonin  5 mg Oral QHS   montelukast  10 mg Oral Daily    Continuous Infusions:  piperacillin-tazobactam (ZOSYN)  IV 3.375 g (12/08/21 0210)     LOS: 1 day   Cherene Altes, MD [facility] Office  [phone] Pager - Text Page per Shea Evans  If 7PM-7AM, please contact night-coverage per Amion 12/08/2021, 10:43 AM

## 2021-12-08 NOTE — Plan of Care (Signed)
  Problem: Education: Goal: Knowledge of General Education information will improve Description Including pain rating scale, medication(s)/side effects and non-pharmacologic comfort measures Outcome: Progressing   

## 2021-12-09 ENCOUNTER — Inpatient Hospital Stay (HOSPITAL_COMMUNITY): Payer: Medicare HMO

## 2021-12-09 DIAGNOSIS — R197 Diarrhea, unspecified: Secondary | ICD-10-CM | POA: Diagnosis not present

## 2021-12-09 DIAGNOSIS — N179 Acute kidney failure, unspecified: Secondary | ICD-10-CM | POA: Diagnosis not present

## 2021-12-09 DIAGNOSIS — K5792 Diverticulitis of intestine, part unspecified, without perforation or abscess without bleeding: Secondary | ICD-10-CM | POA: Diagnosis not present

## 2021-12-09 LAB — LACTIC ACID, PLASMA: Lactic Acid, Venous: 1.4 mmol/L (ref 0.5–1.9)

## 2021-12-09 LAB — CBC
HCT: 41.1 % (ref 36.0–46.0)
Hemoglobin: 12.5 g/dL (ref 12.0–15.0)
MCH: 28.2 pg (ref 26.0–34.0)
MCHC: 30.4 g/dL (ref 30.0–36.0)
MCV: 92.8 fL (ref 80.0–100.0)
Platelets: 201 10*3/uL (ref 150–400)
RBC: 4.43 MIL/uL (ref 3.87–5.11)
RDW: 14.1 % (ref 11.5–15.5)
WBC: 28.9 10*3/uL — ABNORMAL HIGH (ref 4.0–10.5)
nRBC: 0 % (ref 0.0–0.2)

## 2021-12-09 LAB — LACTATE DEHYDROGENASE: LDH: 98 U/L (ref 98–192)

## 2021-12-09 LAB — COMPREHENSIVE METABOLIC PANEL
ALT: 7 U/L (ref 0–44)
AST: 13 U/L — ABNORMAL LOW (ref 15–41)
Albumin: 1.6 g/dL — ABNORMAL LOW (ref 3.5–5.0)
Alkaline Phosphatase: 85 U/L (ref 38–126)
Anion gap: 9 (ref 5–15)
BUN: 25 mg/dL — ABNORMAL HIGH (ref 8–23)
CO2: 20 mmol/L — ABNORMAL LOW (ref 22–32)
Calcium: 7.9 mg/dL — ABNORMAL LOW (ref 8.9–10.3)
Chloride: 98 mmol/L (ref 98–111)
Creatinine, Ser: 1.48 mg/dL — ABNORMAL HIGH (ref 0.44–1.00)
GFR, Estimated: 37 mL/min — ABNORMAL LOW (ref >60)
Glucose, Bld: 108 mg/dL — ABNORMAL HIGH (ref 70–99)
Potassium: 3.8 mmol/L (ref 3.5–5.1)
Sodium: 127 mmol/L — ABNORMAL LOW (ref 135–145)
Total Bilirubin: 0.2 mg/dL — ABNORMAL LOW (ref 0.3–1.2)
Total Protein: 4.1 g/dL — ABNORMAL LOW (ref 6.5–8.1)

## 2021-12-09 LAB — PROTIME-INR
INR: 1.2 (ref 0.8–1.2)
Prothrombin Time: 14.9 seconds (ref 11.4–15.2)

## 2021-12-09 LAB — C DIFFICILE (CDIFF) QUICK SCRN (NO PCR REFLEX)
C Diff antigen: POSITIVE — AB
C Diff interpretation: DETECTED
C Diff toxin: POSITIVE — AB

## 2021-12-09 MED ORDER — SODIUM CHLORIDE 0.9 % IV SOLN
INTRAVENOUS | Status: DC
Start: 2021-12-09 — End: 2021-12-18

## 2021-12-09 MED ORDER — ALBUTEROL SULFATE (2.5 MG/3ML) 0.083% IN NEBU
3.0000 mL | INHALATION_SOLUTION | Freq: Four times a day (QID) | RESPIRATORY_TRACT | Status: DC
  Administered 2021-12-09 – 2021-12-12 (×11): 3 mL via RESPIRATORY_TRACT
  Filled 2021-12-09 (×11): qty 3

## 2021-12-09 MED ORDER — BARIUM SULFATE 2 % PO SUSP
ORAL | Status: AC
  Administered 2021-12-09: 1 mL
  Filled 2021-12-09: qty 2

## 2021-12-09 MED ORDER — CEFUROXIME SODIUM 1.5 G IV SOLR: 1.5000 g | Freq: Three times a day (TID) | INTRAVENOUS | Status: DC

## 2021-12-09 MED ORDER — ORAL CARE MOUTH RINSE
15.0000 mL | Freq: Two times a day (BID) | OROMUCOSAL | Status: DC
  Administered 2021-12-09 – 2021-12-12 (×7): 15 mL via OROMUCOSAL

## 2021-12-09 MED ORDER — CEFUROXIME SODIUM 1.5 G IV SOLR
1.5000 g | Freq: Three times a day (TID) | INTRAVENOUS | Status: DC
  Administered 2021-12-09 – 2021-12-10 (×3): 1.5 g via INTRAVENOUS
  Filled 2021-12-09 (×5): qty 1.5

## 2021-12-09 MED ORDER — METRONIDAZOLE 500 MG/100ML IV SOLN
500.0000 mg | Freq: Two times a day (BID) | INTRAVENOUS | Status: DC
  Administered 2021-12-09 (×2): 500 mg via INTRAVENOUS
  Filled 2021-12-09 (×2): qty 100

## 2021-12-09 MED ORDER — VANCOMYCIN HCL 125 MG PO CAPS
125.0000 mg | ORAL_CAPSULE | Freq: Four times a day (QID) | ORAL | Status: DC
  Administered 2021-12-09 – 2021-12-18 (×34): 125 mg via ORAL
  Filled 2021-12-09 (×35): qty 1

## 2021-12-09 NOTE — Progress Notes (Signed)
°  Transition of Care [facility]) Screening Note   Patient Details  Name: Courtney [name] Date of Birth: 09-Feb-1946   Transition of Care [facility]) CM/SW Contact:    Baldemar Lenis, LCSW Phone Number: 12/09/2021, 9:01 AM    Transition of Care Department [facility]) has reviewed patient. Patient from [facility], recently discharged to home from hospital with home health services. We will continue to monitor patient advancement through interdisciplinary progression rounds. If new patient transition needs arise, please place a TOC consult.

## 2021-12-09 NOTE — Progress Notes (Signed)
Courtney [name]  [id number] DOB: 07/09/1946 DOA: 12/06/2021 PCP: Wenda Low, MD    Brief Narrative:  76 yo w/ a hx of hypothyroidism, OSA, HLD, and HTN who as recently admitted w/ CoViD diarrhea (discharged 11/24/21) who suffered the recurrence of diarrhea 2 days prior to this admit, prompting her return to the ED. In the ED she was hypotensive w/ SBP in the 70s. She was also in AKI w/ hypomagnesemia. CT abdom/pelvis noted diverticulitis and colitis.   Consultants:  None  Code Status: FULL CODE  DVT prophylaxis: Lovenox   Interim Hx: Low-grade temp elevation to 100.9 Tmax.  Remains tachycardic at approximately 105.  Blood pressure improved but remains marginal at 99991111 systolic.  Saturations 96% room air.  Now hyponatremic.  Creatinine worsening.  WBC climbing.  Alert and conversant.  Tells me she feels the same that she did yesterday and the day before-no worse but no better.  Denies chest pain or shortness of breath.  Assessment & Plan:  Sigmoid diverticulitis - colitis of the cecum > hepatic flexure  Not improving clinically as would be expected -repeat CT abdomen/pelvis - change antibiotic coverage to include flagyl   Hypotension Felt to be a consequence of dehydration plus ongoing infection -worrisome -improving with volume resuscitation -recheck lactic acid and LDH  Acute kidney injury Recurring with episodes of hypotension -continue increased volume support and follow trend  Hypomagnesemia  Corrected with supplementation  Hypothyroidism Continue usual hormone replacement  HLD Hold medical therapy until oral intake more consistent  Depression Continue usual medical care  Anemia  Hemoglobin holding steady  Recent CoviD   HTN Not an active issue at this time   Family Communication: No family present at time of exam Disposition: From ALF - dispo pending clinical improvement   Objective: Blood pressure 99/60, pulse (!) 106, temperature (!) 100.9 F (38.3  C), temperature source Oral, resp. rate 18, height 5\' 2"  (1.575 m), weight 95 kg, SpO2 96 %.  Intake/Output Summary (Last 24 hours) at 12/09/2021 1025 Last data filed at 12/09/2021 0900 Gross per 24 hour  Intake 4850.38 ml  Output 1400 ml  Net 3450.38 ml    Filed Weights   12/06/21 2131 12/07/21 1323  Weight: 89.8 kg 95 kg    Examination: General: No acute respiratory distress Lungs: Clear to auscultation bilaterally Cardiovascular: Tachycardic but regular without murmur Abdomen: Overweight, soft, diffusely tender more prominently in bilateral lower quadrants, no mass, bowel sounds hypoactive but present -exam unchanged today Extremities: No significant edema bilateral LE  CBC: Recent Labs  Lab 12/06/21 2213 12/07/21 1556 12/08/21 0108 12/09/21 0354  WBC 15.7* 20.1* 22.6* 28.9*  NEUTROABS 12.0*  --   --   --   HGB 11.8* 13.1 13.1 12.5  HCT 37.2 41.0 40.8 41.1  MCV 90.7 91.1 91.3 92.8  PLT 293 300 300 [id number]    Basic Metabolic Panel: Recent Labs  Lab 12/06/21 2213 12/07/21 0431 12/08/21 0108 12/09/21 0606  NA 134* 136 132* 127*  K 3.7 4.2 4.2 3.8  CL 100 105 98 98  CO2 25 19* 25 20*  GLUCOSE 121* 102* 123* 108*  BUN 15 14 15  25*  CREATININE 1.26* 0.93 1.30* 1.48*  CALCIUM 8.3* 8.1* 8.4* 7.9*  MG 1.6*  --  1.9  --     GFR: Estimated Creatinine Clearance: 36.4 mL/min (A) (by C-G formula based on SCr of 1.48 mg/dL (H)).  Liver Function Tests: Recent Labs  Lab 12/06/21 2213 12/07/21 0431 12/08/21 0108 12/09/21  0606  AST 15 15 13* 13*  ALT 11 9 9 7   ALKPHOS 87 91 87 85  BILITOT 0.4 0.3 0.5 0.2*  PROT 5.3* 5.5* 5.0* 4.1*  ALBUMIN 2.4* 2.4* 2.0* 1.6*    Recent Labs  Lab 12/06/21 2213  LIPASE 21    Scheduled Meds:  budesonide (PULMICORT) nebulizer solution  0.25 mg Nebulization BID   buPROPion  150 mg Oral Daily   enoxaparin (LOVENOX) injection  40 mg Subcutaneous Q24H   escitalopram  10 mg Oral Daily   fluticasone  2 spray Each Nare Daily    gabapentin  100 mg Oral TID   levothyroxine  50 mcg Oral Q0600   LORazepam  1 mg Oral QHS   melatonin  5 mg Oral QHS   montelukast  10 mg Oral Daily   Continuous Infusions:  sodium chloride 125 mL/hr at 12/09/21 0951   piperacillin-tazobactam (ZOSYN)  IV 3.375 g (12/09/21 0953)     LOS: 2 days   Cherene Altes, MD [facility] Office  [phone] Pager - Text Page per Shea Evans  If 7PM-7AM, please contact night-coverage per Amion 12/09/2021, 10:25 AM

## 2021-12-09 NOTE — Progress Notes (Signed)
°   12/09/21 0219  Urine Characteristics  Urinary Incontinence Yes  Urine Color Amber  Urine Appearance Clear  Urinary Interventions Bladder scan  Intermittent/Straight Cath (mL) 800 mL  Hygiene Peri care

## 2021-12-10 ENCOUNTER — Other Ambulatory Visit (HOSPITAL_COMMUNITY): Payer: Self-pay

## 2021-12-10 DIAGNOSIS — R197 Diarrhea, unspecified: Secondary | ICD-10-CM | POA: Diagnosis not present

## 2021-12-10 DIAGNOSIS — N179 Acute kidney failure, unspecified: Secondary | ICD-10-CM | POA: Diagnosis not present

## 2021-12-10 LAB — COMPREHENSIVE METABOLIC PANEL
ALT: 8 U/L (ref 0–44)
AST: 11 U/L — ABNORMAL LOW (ref 15–41)
Albumin: <1.5 g/dL — ABNORMAL LOW (ref 3.5–5.0)
Alkaline Phosphatase: 93 U/L (ref 38–126)
Anion gap: 6 (ref 5–15)
BUN: 24 mg/dL — ABNORMAL HIGH (ref 8–23)
CO2: 23 mmol/L (ref 22–32)
Calcium: 8 mg/dL — ABNORMAL LOW (ref 8.9–10.3)
Chloride: 100 mmol/L (ref 98–111)
Creatinine, Ser: 1.03 mg/dL — ABNORMAL HIGH (ref 0.44–1.00)
GFR, Estimated: 57 mL/min — ABNORMAL LOW (ref >60)
Glucose, Bld: 97 mg/dL (ref 70–99)
Potassium: 3.8 mmol/L (ref 3.5–5.1)
Sodium: 129 mmol/L — ABNORMAL LOW (ref 135–145)
Total Bilirubin: 0.2 mg/dL — ABNORMAL LOW (ref 0.3–1.2)
Total Protein: 4.3 g/dL — ABNORMAL LOW (ref 6.5–8.1)

## 2021-12-10 LAB — PHOSPHORUS: Phosphorus: 2.8 mg/dL (ref 2.5–4.6)

## 2021-12-10 LAB — CBC
HCT: 34.8 % — ABNORMAL LOW (ref 36.0–46.0)
Hemoglobin: 11 g/dL — ABNORMAL LOW (ref 12.0–15.0)
MCH: 28.3 pg (ref 26.0–34.0)
MCHC: 31.6 g/dL (ref 30.0–36.0)
MCV: 89.5 fL (ref 80.0–100.0)
Platelets: 284 10*3/uL (ref 150–400)
RBC: 3.89 MIL/uL (ref 3.87–5.11)
RDW: 14.3 % (ref 11.5–15.5)
WBC: 31.2 10*3/uL — ABNORMAL HIGH (ref 4.0–10.5)
nRBC: 0 % (ref 0.0–0.2)

## 2021-12-10 LAB — LIPASE, BLOOD: Lipase: 17 U/L (ref 11–51)

## 2021-12-10 LAB — MAGNESIUM: Magnesium: 1.6 mg/dL — ABNORMAL LOW (ref 1.7–2.4)

## 2021-12-10 MED ORDER — METRONIDAZOLE 500 MG/100ML IV SOLN
500.0000 mg | Freq: Three times a day (TID) | INTRAVENOUS | Status: DC
  Administered 2021-12-10 – 2021-12-18 (×23): 500 mg via INTRAVENOUS
  Filled 2021-12-10 (×24): qty 100

## 2021-12-10 MED ORDER — MAGNESIUM SULFATE 4 GM/100ML IV SOLN
4.0000 g | Freq: Once | INTRAVENOUS | Status: AC
  Administered 2021-12-10: 4 g via INTRAVENOUS
  Filled 2021-12-10: qty 100

## 2021-12-10 NOTE — Progress Notes (Signed)
Courtney [name]  [id number] DOB: [birth year] DOA: 12/06/2021 PCP: Wenda Low, MD    Brief Narrative:  76 yo w/ a hx of hypothyroidism, OSA, HLD, and HTN who as recently admitted w/ CoViD diarrhea (discharged 11/24/21) who suffered the recurrence of diarrhea 2 days prior to this admit, prompting her return to the ED. In the ED she was hypotensive w/ SBP in the 70s. She was also in AKI w/ hypomagnesemia. CT abdom/pelvis noted diverticulitis and colitis.   Consultants:  None  Code Status: FULL CODE  DVT prophylaxis: Lovenox   Interim Hx: Transferred to progressive care yesterday to allow closer monitoring in setting of ongoing tachycardia and moderate hypotension.  Some sinus tachycardia persists but blood pressure appears to be improving with systolics currently 99991111.  Saturation 97-100% on 4 L.  Creatinine has improved from 1.48-1.03.  WBC continues to climb.  CT abdomen/pelvis yesterday noted worsening pancolitis.  C. difficile testing has returned positive in pattern c/w acute infection, which is c/w clinical impression.  Patient tells me she feels about the same or may be slightly better today.  She is tolerating some clear liquid intake without difficulty at the time of my visit.  Assessment & Plan:  Acute C difficile pan-colitis  Did not improve with empiric treatment for suspected diverticulitis/colitis - repeat CT abdomen/pelvis noted progressive pancolitis -C. difficile testing positive -continue oral vancomycin plus IV Flagyl given severity of illness  Hypotension Felt to be a consequence of dehydration plus ongoing infection -worrisome -improving with volume resuscitation -recheck lactic acid and LDH encouraging  Acute kidney injury Recurring with episodes of hypotension -continue increased volume support and follow trend -creatinine improved today  Hypomagnesemia  Due to GI losses -supplement further and follow  Hypothyroidism Continue usual hormone  replacement  HLD Hold medical therapy until oral intake more consistent  Depression Continue usual medical care  Anemia  Hemoglobin has decreased slightly with ongoing aggressive hydration -no evidence of significant blood loss  Recent CoviD   HTN Not an active issue at this time   Family Communication: Spoke with husband at bedside Disposition: From ALF - dispo pending clinical improvement   Objective: Blood pressure 114/63, pulse (!) 105, temperature 98.5 F (36.9 C), temperature source Oral, resp. rate 16, height 5\' 2"  (1.575 m), weight 95 kg, SpO2 97 %.  Intake/Output Summary (Last 24 hours) at 12/10/2021 1047 Last data filed at 12/10/2021 0641 Gross per 24 hour  Intake 1663.93 ml  Output 1125 ml  Net 538.93 ml    Filed Weights   12/06/21 2131 12/07/21 1323  Weight: 89.8 kg 95 kg    Examination: General: No acute respiratory distress Lungs: Clear to auscultation bilaterally Cardiovascular: Tachycardic but regular without murmur Abdomen: Seems slightly less tender today, bowel sounds positive, no rebound, soft Extremities: No significant edema bilateral LE  CBC: Recent Labs  Lab 12/06/21 2213 12/07/21 1556 12/08/21 0108 12/09/21 0354 12/10/21 0727  WBC 15.7*   < > 22.6* 28.9* 31.2*  NEUTROABS 12.0*  --   --   --   --   HGB 11.8*   < > 13.1 12.5 11.0*  HCT 37.2   < > 40.8 41.1 34.8*  MCV 90.7   < > 91.3 92.8 89.5  PLT 293   < > 300 201 284   < > = values in this interval not displayed.    Basic Metabolic Panel: Recent Labs  Lab 12/06/21 2213 12/07/21 0431 12/08/21 0108 12/09/21 0606 12/10/21 0727  NA 134*   < >  132* 127* 129*  K 3.7   < > 4.2 3.8 3.8  CL 100   < > 98 98 100  CO2 25   < > 25 20* 23  GLUCOSE 121*   < > 123* 108* 97  BUN 15   < > 15 25* 24*  CREATININE 1.26*   < > 1.30* 1.48* 1.03*  CALCIUM 8.3*   < > 8.4* 7.9* 8.0*  MG 1.6*  --  1.9  --  1.6*  PHOS  --   --   --   --  2.8   < > = values in this interval not displayed.     GFR: Estimated Creatinine Clearance: 52.3 mL/min (A) (by C-G formula based on SCr of 1.03 mg/dL (H)).  Liver Function Tests: Recent Labs  Lab 12/07/21 0431 12/08/21 0108 12/09/21 0606 12/10/21 0727  AST 15 13* 13* 11*  ALT 9 9 7 8   ALKPHOS 91 87 85 93  BILITOT 0.3 0.5 0.2* 0.2*  PROT 5.5* 5.0* 4.1* 4.3*  ALBUMIN 2.4* 2.0* 1.6* <1.5*    Recent Labs  Lab 12/06/21 2213 12/10/21 0727  LIPASE 21 17    Scheduled Meds:  albuterol  3 mL Inhalation Q6H   budesonide (PULMICORT) nebulizer solution  0.25 mg Nebulization BID   buPROPion  150 mg Oral Daily   enoxaparin (LOVENOX) injection  40 mg Subcutaneous Q24H   escitalopram  10 mg Oral Daily   fluticasone  2 spray Each Nare Daily   gabapentin  100 mg Oral TID   levothyroxine  50 mcg Oral Q0600   LORazepam  1 mg Oral QHS   mouth rinse  15 mL Mouth Rinse BID   melatonin  5 mg Oral QHS   montelukast  10 mg Oral Daily   vancomycin  125 mg Oral QID   Continuous Infusions:  sodium chloride 125 mL/hr at 12/10/21 0341   cefUROXime (ZINACEF)  IV 1.5 g (12/10/21 0610)   metronidazole 500 mg (12/09/21 2316)     LOS: 3 days   Cherene Altes, MD [facility] Office  [phone] Pager - Text Page per Shea Evans  If 7PM-7AM, please contact night-coverage per Amion 12/10/2021, 10:47 AM

## 2021-12-10 NOTE — Plan of Care (Signed)
°  Problem: Respiratory: Goal: Respiratory symptoms related to disease process will be avoided Outcome: Progressing   Problem: Clinical Measurements: Goal: Respiratory complications will improve Outcome: Progressing   Problem: Coping: Goal: Level of anxiety will decrease Outcome: Progressing   Problem: Elimination: Goal: Will not experience complications related to bowel motility Outcome: Progressing   Problem: Pain Managment: Goal: General experience of comfort will improve Outcome: Progressing   Problem: Safety: Goal: Ability to remain free from injury will improve Outcome: Progressing   Problem: Skin Integrity: Goal: Risk for impaired skin integrity will decrease Outcome: Progressing   Problem: Education: Goal: Knowledge of disease and its progression will improve Outcome: Not Progressing   Problem: Health Behavior/Discharge Planning: Goal: Ability to manage health-related needs will improve Outcome: Not Progressing   Problem: Clinical Measurements: Goal: Complications related to the disease process or treatment will be avoided or minimized Outcome: Not Progressing   Problem: Activity: Goal: Activity intolerance will improve Outcome: Not Progressing   Problem: Fluid Volume: Goal: Fluid volume balance will be maintained or improved Outcome: Not Progressing   Problem: Nutritional: Goal: Ability to make appropriate dietary choices will improve Outcome: Not Progressing   Problem: Urinary Elimination: Goal: Progression of disease will be identified and treated Outcome: Not Progressing   Problem: Clinical Measurements: Goal: Ability to maintain clinical measurements within normal limits will improve Outcome: Not Progressing Goal: Will remain free from infection Outcome: Not Progressing Goal: Diagnostic test results will improve Outcome: Not Progressing Goal: Cardiovascular complication will be avoided Outcome: Not Progressing   Problem: Activity: Goal:  Risk for activity intolerance will decrease Outcome: Not Progressing   Problem: Nutrition: Goal: Adequate nutrition will be maintained Outcome: Not Progressing   Problem: Elimination: Goal: Will not experience complications related to urinary retention Outcome: Not Progressing

## 2021-12-10 NOTE — TOC Benefit Eligibility Note (Signed)
Patient Advocate Encounter  Prior Authorization for Vancomycin 125 mg capsules has been approved.    PA# [id number] Effective dates: 12/10/2021 through 10/20/2022  Patients co-pay is $95.00.     Roland Earl, CPhT Pharmacy Patient Advocate Specialist [facility] Patient Advocate Team Direct Number: [phone]  Fax: [phone]

## 2021-12-10 NOTE — Care Management Important Message (Signed)
Important Message  Patient Details  Name: Courtney [name] MRN: [id number] Date of Birth: [birth year]   Medicare Important Message Given:  Yes     Sherilyn Banker 12/10/2021, 1:08 PM

## 2021-12-10 NOTE — Progress Notes (Signed)
Mobility Specialist Progress Note    12/10/21 1702  Mobility  Activity Transferred to/from [facility]  Level of Assistance +2 (takes two people)  Assistive Device Stedy  Activity Response Tolerated poorly  $Mobility charge 1 Mobility   Pt received in bed and agreeable to get to [facility]. MaxA for bed mobility. Pt struggled sitting up without assist so called RN to help. Retrieved stedy and pt was MaxA +2 for use. Had no void on BSC. Returned to bed with call bell in reach and RN present.   [facility] Mobility Specialist  M.S. 5N: [id number]

## 2021-12-10 NOTE — TOC Benefit Eligibility Note (Signed)
Patient Product/process development scientist completed.    The patient is currently admitted and upon discharge could be taking Vancomycin 125 mg capsules.  Prior Authorization Required  The patient is insured through [facility] Medicare Part D     Roland Earl, CPhT Pharmacy Patient Advocate Specialist [facility] Patient Advocate Team Direct Number: [phone]  Fax: [phone]

## 2021-12-11 DIAGNOSIS — A0472 Enterocolitis due to Clostridium difficile, not specified as recurrent: Principal | ICD-10-CM

## 2021-12-11 DIAGNOSIS — N179 Acute kidney failure, unspecified: Secondary | ICD-10-CM | POA: Diagnosis not present

## 2021-12-11 LAB — COMPREHENSIVE METABOLIC PANEL
ALT: 6 U/L (ref 0–44)
AST: 15 U/L (ref 15–41)
Albumin: 1.6 g/dL — ABNORMAL LOW (ref 3.5–5.0)
Alkaline Phosphatase: 103 U/L (ref 38–126)
Anion gap: 7 (ref 5–15)
BUN: 16 mg/dL (ref 8–23)
CO2: 22 mmol/L (ref 22–32)
Calcium: 8.1 mg/dL — ABNORMAL LOW (ref 8.9–10.3)
Chloride: 98 mmol/L (ref 98–111)
Creatinine, Ser: 0.74 mg/dL (ref 0.44–1.00)
GFR, Estimated: >60 mL/min (ref >60)
Glucose, Bld: 91 mg/dL (ref 70–99)
Potassium: 4 mmol/L (ref 3.5–5.1)
Sodium: 127 mmol/L — ABNORMAL LOW (ref 135–145)
Total Bilirubin: 0.7 mg/dL (ref 0.3–1.2)
Total Protein: 4.5 g/dL — ABNORMAL LOW (ref 6.5–8.1)

## 2021-12-11 LAB — CBC
HCT: 36 % (ref 36.0–46.0)
Hemoglobin: 11.4 g/dL — ABNORMAL LOW (ref 12.0–15.0)
MCH: 28.6 pg (ref 26.0–34.0)
MCHC: 31.7 g/dL (ref 30.0–36.0)
MCV: 90.5 fL (ref 80.0–100.0)
Platelets: 343 10*3/uL (ref 150–400)
RBC: 3.98 MIL/uL (ref 3.87–5.11)
RDW: 14.4 % (ref 11.5–15.5)
WBC: 31.5 10*3/uL — ABNORMAL HIGH (ref 4.0–10.5)
nRBC: 0 % (ref 0.0–0.2)

## 2021-12-11 LAB — MAGNESIUM: Magnesium: 2.1 mg/dL (ref 1.7–2.4)

## 2021-12-11 MED ORDER — FUROSEMIDE 10 MG/ML IJ SOLN
20.0000 mg | Freq: Once | INTRAMUSCULAR | Status: AC
  Administered 2021-12-11: 20 mg via INTRAVENOUS
  Filled 2021-12-11: qty 2

## 2021-12-11 MED ORDER — CHLORHEXIDINE GLUCONATE CLOTH 2 % EX PADS
6.0000 | MEDICATED_PAD | Freq: Every day | CUTANEOUS | Status: DC
  Administered 2021-12-11: 6 via TOPICAL

## 2021-12-11 NOTE — Progress Notes (Signed)
Mobility Specialist Progress Note    12/11/21 1148  Mobility  Bed Position Chair  Activity Transferred from bed to chair  Level of Assistance +2 (takes two people)  Assistive Device Stedy  Activity Response Tolerated fair  $Mobility charge 1 Mobility   Pt received and agreeable. No complaints. Left with call bell in reach and husband present.   [facility] Mobility Specialist  M.S. 5N: [phone]

## 2021-12-11 NOTE — Plan of Care (Signed)
  Problem: Education: Goal: Knowledge of disease and its progression will improve Outcome: Progressing   Problem: Health Behavior/Discharge Planning: Goal: Ability to manage health-related needs will improve Outcome: Progressing   Problem: Clinical Measurements: Goal: Complications related to the disease process or treatment will be avoided or minimized Outcome: Progressing   

## 2021-12-11 NOTE — Evaluation (Signed)
Physical Therapy Evaluation Patient Details Name: Courtney [name] MRN: [id number] DOB: May 06, 1946 Today's Date: 12/11/2021  History of Present Illness  Patient is 76 y.o. female recently admitted w/ COVID and symptoms of SOB and diarrhea on 11/16/21 and discharged 11/24/21. She has returned home with HHPT services. Pt returning to [facility] on 2/16 for recurrence of diarrhea 2 days prior. In the ED she was hypotensive w/ SBP in the 70s. She was also in AKI w/ hypomagnesemia. CT abdom/pelvis noted diverticulitis and colitis.  PMH significant for bipolar disorder, HTN, mitral valve prolapse, claustrophobia, CVA, vertigo, PE, prior back surgery, B knee replacements.   Clinical Impression  Courtney [name] is 76 y.o. female admitted with above HPI and diagnosis. Patient is currently limited by functional impairments below (see PT problem list). Patient lives with her husband and after recent discharge on 11/24/21 pt was ambulating household distances with RW and min assist. Pt has had significant decline in mobility and now requires Max+2 assist for bed mobility and Min-Mod+2 to complete sit<>stand transfers. Pt only able to stand for <10 seconds due to fatigue and demonstrate poor trunk control with posterior/Rt lean in sitting. Patient will benefit from continued skilled PT interventions to address impairments and progress independence with mobility, recommending ST rehab at SNF, pt would like to go to SNF at [facility] where she lives in [place] typically. Acute PT will follow and progress as able.        Recommendations for follow up therapy are one component of a multi-disciplinary discharge planning process, led by the attending physician.  Recommendations may be updated based on patient status, additional functional criteria and insurance authorization.  Follow Up Recommendations Skilled nursing-short term rehab (<3 hours/day)    Assistance Recommended at Discharge Frequent or constant Supervision/Assistance   Patient can return home with the following  Two people to help with walking and/or transfers;Two people to help with bathing/dressing/bathroom;Assistance with cooking/housework;Assistance with feeding;Help with stairs or ramp for entrance;Assist for transportation;Direct supervision/assist for medications management    Equipment Recommendations None recommended by PT  Recommendations for Other Services  OT consult    Functional Status Assessment Patient has had a recent decline in their functional status and demonstrates the ability to make significant improvements in function in a reasonable and predictable amount of time.     Precautions / Restrictions Precautions Precautions: Fall Precaution Comments: flexiseal Restrictions Weight Bearing Restrictions: No      Mobility  Bed Mobility Overal bed mobility: Needs Assistance Bed Mobility: Supine to Sit, Sit to Supine     Supine to sit: HOB elevated, +2 for safety/equipment, Mod assist, Max assist Sit to supine: Max assist, +2 for physical assistance, +2 for safety/equipment   General bed mobility comments: Cues for use of bed rail and to walk bil LE's off EOB. Pt required assist for LE's and to reach Lt UE to rail and press up trunk to sit. Pt with Rt and posteiror lean in sitting EOB. Max+2 asisst to return to supine due to significant weakness and posterior lean at EOB with hips sliding anteriorly.    Transfers Overall transfer level: Needs assistance Equipment used: Rolling walker (2 wheels) Transfers: Sit to/from Stand Sit to Stand: Mod assist, Min assist, +2 physical assistance, +2 safety/equipment, From elevated surface           General transfer comment: EOB slightly elevated. Min-Mod assist for power up, pt keeping bil UE's on RW to rise. completed 2x from EOB, pt sitting after ~10 seconds  due to fatigue. Attempted small Rt lateral side step to move up towards HOB, Rt LE buckling requiring manual blocking by therapist  with attempt to step Lt LE.    Ambulation/Gait                  Stairs            Wheelchair Mobility    Modified Rankin (Stroke Patients Only)       Balance Overall balance assessment: Needs assistance Sitting-balance support: Feet supported, Bilateral upper extremity supported Sitting balance-Leahy Scale: Poor Sitting balance - Comments: pt reliant on UE and close CGA to Mod assist to maintain seated balance. Postural control: Posterior lean, Right lateral lean Standing balance support: Bilateral upper extremity supported, Reliant on assistive device for balance Standing balance-Leahy Scale: Poor                               Pertinent Vitals/Pain Pain Assessment Pain Assessment: Faces Faces Pain Scale: Hurts a little bit Pain Location: generalized Pain Descriptors / Indicators: Aching, Discomfort Pain Intervention(s): Limited activity within patient's tolerance, Monitored during session, Repositioned    Home Living Family/patient expects to be discharged to:: Private residence (ILF at [place]) Living Arrangements: Spouse/significant other Available Help at Discharge: Family;Available 24 hours/day Type of Home: Independent living facility Home Access: Elevator       Home Layout: One level Home Equipment: Rollator (4 wheels);Grab bars - toilet;Grab bars - tub/shower;Shower seat - built in;Cane - quad;Cane - single point Additional Comments: pt had recieved 1 visit of HHPT since return home from last admission    Prior Function Prior Level of Function : Needs assist       Physical Assist : ADLs (physical);Mobility (physical) Mobility (physical): Bed mobility ADLs (physical): Dressing;Feeding Mobility Comments: At discharge on 11/24/21 pt was using rollator for short walks in home with min assist from spouse. ADLs Comments: husband helps puts socks and pants on sometimes; helps cut food sometimes     Hand Dominance   Dominant Hand:  Right    Extremity/Trunk Assessment   Upper Extremity Assessment Upper Extremity Assessment: Defer to OT evaluation;Generalized weakness    Lower Extremity Assessment Lower Extremity Assessment: Generalized weakness    Cervical / Trunk Assessment Cervical / Trunk Assessment: Other exceptions Cervical / Trunk Exceptions: habitus  Communication   Communication: No difficulties  Cognition Arousal/Alertness: Awake/alert Behavior During Therapy: WFL for tasks assessed/performed Overall Cognitive Status: Within Functional Limits for tasks assessed                                 General Comments: pt pleasant and agreeable to therapy despite timeup in recliner today. pt and spouse provide unclear timeline on therapy services at home and time home since recent hospitalization.        General Comments      Exercises     Assessment/Plan    PT Assessment Patient needs continued PT services  PT Problem List Decreased strength;Decreased activity tolerance;Decreased balance;Decreased mobility;Obesity       PT Treatment Interventions DME instruction;Gait training;Functional mobility training;Therapeutic activities;Therapeutic exercise;Balance training;Patient/family education    PT Goals (Current goals can be found in the Care Plan section)  Acute Rehab PT Goals Patient Stated Goal: get stronger to get walking again PT Goal Formulation: With patient/family Time For Goal Achievement: 12/25/21 Potential to Achieve Goals: Good  Frequency Min 3X/week     Co-evaluation               AM-PAC PT "6 Clicks" Mobility  Outcome Measure Help needed turning from your back to your side while in a flat bed without using bedrails?: A Lot Help needed moving from lying on your back to sitting on the side of a flat bed without using bedrails?: Total Help needed moving to and from a bed to a chair (including a wheelchair)?: Total Help needed standing up from a chair using  your arms (e.g., wheelchair or bedside chair)?: A Lot Help needed to walk in hospital room?: Total Help needed climbing 3-5 steps with a railing? : Total 6 Click Score: 8    End of Session Equipment Utilized During Treatment: Gait belt;Oxygen Activity Tolerance: Patient tolerated treatment well;Patient limited by fatigue Patient left: with call bell/phone within reach;in bed;with bed alarm set;with family/visitor present Nurse Communication: Mobility status PT Visit Diagnosis: Unsteadiness on feet (R26.81);Muscle weakness (generalized) (M62.81)    Time: [id number] PT Time Calculation (min) (ACUTE ONLY): 32 min   Charges:   PT Evaluation $PT Eval Moderate Complexity: 1 Mod PT Treatments $Therapeutic Activity: 8-22 mins         Verner Mould, DPT Acute Rehabilitation Services Office [phone] Pager [phone]   Jacques Navy 12/11/2021, 4:44 PM

## 2021-12-11 NOTE — Progress Notes (Signed)
Mobility Specialist: Progress Note   12/11/21 1438  Mobility  Activity Transferred from chair to bed  Level of Assistance +2 (takes two people)  Assistive Device Stedy  Activity Response Tolerated fair  $Mobility charge 1 Mobility   Pt assisted back to bed per request. +2 physical assistance needed to help pt to stand, no c/o throughout. Pt back to bed with call bell at her side. Bed alarm is on.   [facility] Charde Macfarlane Mobility Specialist Mobility Specialist 5 North: [phone] Mobility Specialist 6 North: [phone]

## 2021-12-11 NOTE — Progress Notes (Signed)
Courtney [name]  [id number] DOB: [birth year] DOA: 12/06/2021 PCP: Wenda Low, MD    Brief Narrative:  76 yo w/ a hx of hypothyroidism, OSA, HLD, and HTN who as recently admitted w/ CoViD diarrhea (discharged 11/24/21) who suffered the recurrence of diarrhea 2 days prior to this admit, prompting her return to the ED. In the ED she was hypotensive w/ SBP in the 70s. She was also in AKI w/ hypomagnesemia. CT abdom/pelvis noted diverticulitis and colitis.   Consultants:  None  Code Status: FULL CODE  DVT prophylaxis: Lovenox   Interim Hx: Afebrile.  Blood pressure has normalized.  Heart rate overall appears to be slowing down and ranging presently between 92 and 102.  Renal function has normalized.  WBC remains quite elevated.  Is alert and conversant.  Reports ongoing abdominal discomfort.  Is extremely weak in general with very poor appetite.  Assessment & Plan:  Acute C difficile pan-colitis  Did not improve with empiric treatment for suspected diverticulitis/colitis - repeat CT abdomen/pelvis noted progressive pancolitis -C. difficile testing positive -continue oral vancomycin plus IV Flagyl given severity of illness  Hypotension Felt to be a consequence of dehydration plus ongoing infection -now resolved with large-scale volume resuscitation -slow IV fluid greatly and follow trend  Acute kidney injury Recurred with episodes of hypotension but ultimately resolved with ongoing volume resuscitation  Hypomagnesemia  Due to GI losses -supplement further and follow  Hypothyroidism Continue usual hormone replacement  HLD Hold medical therapy until oral intake more consistent  Depression Continue usual medical care  Anemia  Hemoglobin has decreased slightly with ongoing aggressive hydration -no evidence of significant blood loss  Recent CoviD   HTN Not an active issue at this time   Family Communication: Spoke with husband at bedside Disposition: From ALF - dispo  pending clinical improvement   Objective: Blood pressure 132/77, pulse (!) 101, temperature 98.1 F (36.7 C), temperature source Oral, resp. rate 16, height 5\' 2"  (1.575 m), weight 95 kg, SpO2 96 %.  Intake/Output Summary (Last 24 hours) at 12/11/2021 1007 Last data filed at 12/11/2021 0014 Gross per 24 hour  Intake 3195.6 ml  Output --  Net 3195.6 ml    Filed Weights   12/06/21 2131 12/07/21 1323  Weight: 89.8 kg 95 kg    Examination: General: No acute respiratory distress Lungs: Clear to auscultation bilaterally without wheezing Cardiovascular: Heart rate has slowed significantly without murmur or rub Abdomen: Mildly tender diffusely, no rebound, no appreciable mass, bowel sounds positive Extremities: 1+ edema bilateral lower extremities  CBC: Recent Labs  Lab 12/06/21 2213 12/07/21 1556 12/09/21 0354 12/10/21 0727 12/11/21 0333  WBC 15.7*   < > 28.9* 31.2* 31.5*  NEUTROABS 12.0*  --   --   --   --   HGB 11.8*   < > 12.5 11.0* 11.4*  HCT 37.2   < > 41.1 34.8* 36.0  MCV 90.7   < > 92.8 89.5 90.5  PLT 293   < > 201 284 343   < > = values in this interval not displayed.    Basic Metabolic Panel: Recent Labs  Lab 12/08/21 0108 12/09/21 0606 12/10/21 0727 12/11/21 0557  NA 132* 127* 129* 127*  K 4.2 3.8 3.8 4.0  CL 98 98 100 98  CO2 25 20* 23 22  GLUCOSE 123* 108* 97 91  BUN 15 25* 24* 16  CREATININE 1.30* 1.48* 1.03* 0.74  CALCIUM 8.4* 7.9* 8.0* 8.1*  MG 1.9  --  1.6* 2.1  PHOS  --   --  2.8  --     GFR: Estimated Creatinine Clearance: 67.3 mL/min (by C-G formula based on SCr of 0.74 mg/dL).  Liver Function Tests: Recent Labs  Lab 12/08/21 0108 12/09/21 0606 12/10/21 0727 12/11/21 0557  AST 13* 13* 11* 15  ALT 9 7 8 6   ALKPHOS 87 85 93 103  BILITOT 0.5 0.2* 0.2* 0.7  PROT 5.0* 4.1* 4.3* 4.5*  ALBUMIN 2.0* 1.6* <1.5* 1.6*    Recent Labs  Lab 12/06/21 2213 12/10/21 0727  LIPASE 21 17    Scheduled Meds:  albuterol  3 mL Inhalation Q6H    budesonide (PULMICORT) nebulizer solution  0.25 mg Nebulization BID   buPROPion  150 mg Oral Daily   Chlorhexidine Gluconate Cloth  6 each Topical Daily   enoxaparin (LOVENOX) injection  40 mg Subcutaneous Q24H   escitalopram  10 mg Oral Daily   fluticasone  2 spray Each Nare Daily   gabapentin  100 mg Oral TID   levothyroxine  50 mcg Oral Q0600   LORazepam  1 mg Oral QHS   mouth rinse  15 mL Mouth Rinse BID   melatonin  5 mg Oral QHS   montelukast  10 mg Oral Daily   vancomycin  125 mg Oral QID   Continuous Infusions:  sodium chloride 125 mL/hr at 12/11/21 0102   metronidazole 500 mg (12/11/21 0308)     LOS: 4 days   Cherene Altes, MD [facility] Office  [phone] Pager - Text Page per Shea Evans  If 7PM-7AM, please contact night-coverage per Amion 12/11/2021, 10:07 AM

## 2021-12-11 NOTE — Progress Notes (Signed)
Patients O2 sat dropping between 84-85% on room air. Applied 1LPM of oxygen via nasal cannula to patient o2 sats now staying steady around 94%.

## 2021-12-12 ENCOUNTER — Inpatient Hospital Stay (HOSPITAL_COMMUNITY): Payer: Medicare HMO

## 2021-12-12 DIAGNOSIS — N179 Acute kidney failure, unspecified: Secondary | ICD-10-CM | POA: Diagnosis not present

## 2021-12-12 LAB — BASIC METABOLIC PANEL
Anion gap: 9 (ref 5–15)
BUN: 5 mg/dL — ABNORMAL LOW (ref 8–23)
CO2: 25 mmol/L (ref 22–32)
Calcium: 8 mg/dL — ABNORMAL LOW (ref 8.9–10.3)
Chloride: 98 mmol/L (ref 98–111)
Creatinine, Ser: 0.57 mg/dL (ref 0.44–1.00)
GFR, Estimated: >60 mL/min (ref >60)
Glucose, Bld: 77 mg/dL (ref 70–99)
Potassium: 3.2 mmol/L — ABNORMAL LOW (ref 3.5–5.1)
Sodium: 132 mmol/L — ABNORMAL LOW (ref 135–145)

## 2021-12-12 LAB — CBC
HCT: 31.5 % — ABNORMAL LOW (ref 36.0–46.0)
Hemoglobin: 10.3 g/dL — ABNORMAL LOW (ref 12.0–15.0)
MCH: 29.1 pg (ref 26.0–34.0)
MCHC: 32.7 g/dL (ref 30.0–36.0)
MCV: 89 fL (ref 80.0–100.0)
Platelets: 320 10*3/uL (ref 150–400)
RBC: 3.54 MIL/uL — ABNORMAL LOW (ref 3.87–5.11)
RDW: 14.5 % (ref 11.5–15.5)
WBC: 18.3 10*3/uL — ABNORMAL HIGH (ref 4.0–10.5)
nRBC: 0 % (ref 0.0–0.2)

## 2021-12-12 LAB — MAGNESIUM: Magnesium: 1.6 mg/dL — ABNORMAL LOW (ref 1.7–2.4)

## 2021-12-12 MED ORDER — ALBUTEROL SULFATE (2.5 MG/3ML) 0.083% IN NEBU
3.0000 mL | INHALATION_SOLUTION | Freq: Three times a day (TID) | RESPIRATORY_TRACT | Status: DC
  Administered 2021-12-13: 3 mL via RESPIRATORY_TRACT
  Filled 2021-12-12: qty 3

## 2021-12-12 MED ORDER — MAGNESIUM SULFATE 4 GM/100ML IV SOLN
4.0000 g | Freq: Once | INTRAVENOUS | Status: AC
  Administered 2021-12-12: 4 g via INTRAVENOUS
  Filled 2021-12-12: qty 100

## 2021-12-12 MED ORDER — POTASSIUM CHLORIDE CRYS ER 20 MEQ PO TBCR
40.0000 meq | EXTENDED_RELEASE_TABLET | Freq: Once | ORAL | Status: AC
  Administered 2021-12-12: 40 meq via ORAL
  Filled 2021-12-12: qty 2

## 2021-12-12 NOTE — Progress Notes (Signed)
Mobility Specialist Progress Note    12/12/21 1627  Mobility  Bed Position Chair  Activity Transferred from bed to chair  Level of Assistance +2 (takes two people)  Assistive Device Stedy  Activity Response Tolerated fair  $Mobility charge 1 Mobility   Pt received and agreeable. MaxA +2 for bed mobility. Pt minA +2 STS. Left with call bell in reach and husband present.   [facility] Mobility Specialist  M.S. 5N: [id number]

## 2021-12-12 NOTE — NC FL2 (Signed)
[place] LEVEL OF CARE SCREENING TOOL     IDENTIFICATION  Patient Name: Courtney [name] Birthdate: 1946/05/09 Sex: female Admission Date (Current Location): 12/06/2021  [facility] and [place] Number:  Herbalist and Address:  [facility] [place] [place] [address], [place], [place] [address]      Provider Number: [id number]  Attending Physician Name and Address:  Georgette Shell, MD  Relative Name and Phone Number:  Jakesha, Dalzell   [phone]    Current Level of Care: Hospital Recommended Level of Care: [place] Prior Approval Number:    Date Approved/Denied:   PASRR Number: [id number]  Discharge Plan: SNF    Current Diagnoses: Patient Active Problem List   Diagnosis Date Noted   Diverticulitis 12/07/2021   Acute diverticulitis of intestine 12/07/2021   Diarrhea    ARF (acute renal failure) ([place]) 12/06/2021   Hypokalemia 11/20/2021   COVID-19 infection with colitis 11/18/2021   CAD (coronary artery disease) 11/18/2021   Colitis with rectal bleeding 11/18/2021   AKI (acute kidney injury) ([place]) 11/18/2021   Aspiration pneumonia of left lower lobe due to regurgitated food ([place]) 04/02/2021   Bipolar disorder ([place]) 04/02/2021   Hypertension 04/02/2021   Hyponatremia 04/02/2021   Dyslipidemia 04/02/2021   Hypothyroidism (acquired) 04/02/2021   OSA (obstructive sleep apnea) 04/02/2021   Cervical spondylosis with myelopathy and radiculopathy 03/28/2021    Orientation RESPIRATION BLADDER Height & Weight     Self, Time, Situation, Place  Normal Incontinent, Indwelling catheter Weight: 209 lb 7 oz (95 kg) Height:  5\' 2"  (157.5 cm)  BEHAVIORAL SYMPTOMS/MOOD NEUROLOGICAL BOWEL NUTRITION STATUS      Incontinent Diet  AMBULATORY STATUS COMMUNICATION OF NEEDS Skin   Total Care Verbally Other (Comment) (ecchymosis)                       Personal Care Assistance Level of Assistance  Bathing, Feeding,  Dressing Bathing Assistance: Maximum assistance Feeding assistance: Limited assistance Dressing Assistance: Maximum assistance     Functional Limitations Info  Sight, Hearing, Speech Sight Info: Adequate Hearing Info: Adequate Speech Info: Adequate    SPECIAL CARE FACTORS FREQUENCY  PT (By licensed PT), OT (By licensed OT)     PT Frequency: 5x week OT Frequency: 5x week            Contractures Contractures Info: Not present    Additional Factors Info  Code Status, Allergies Code Status Info: full Allergies Info: Tetanus-diphtheria Toxoids Td, Meloxicam, Phentermine, Sulfa Antibiotics, Zithromax (Azithromycin)           Current Medications (12/12/2021):  This is the current hospital active medication list Current Facility-Administered Medications  Medication Dose Route Frequency Provider Last Rate Last Admin   0.9 %  sodium chloride infusion   Intravenous Continuous Cherene Altes, MD 10 mL/hr at 12/11/21 1814 Rate Change at 12/11/21 1814   acetaminophen (TYLENOL) tablet 650 mg  650 mg Oral Q6H PRN Rise Patience, MD   650 mg at 12/08/21 1906   albuterol (PROVENTIL) (2.5 MG/3ML) 0.083% nebulizer solution 3 mL  3 mL Inhalation Q6H Cherene Altes, MD   3 mL at 12/12/21 0906   budesonide (PULMICORT) nebulizer solution 0.25 mg  0.25 mg Nebulization BID Rise Patience, MD   0.25 mg at 12/12/21 [id number]   buPROPion (WELLBUTRIN XL) 24 hr tablet 150 mg  150 mg Oral Daily Rise Patience, MD   150 mg at  12/12/21 0928   Chlorhexidine Gluconate Cloth 2 % PADS 6 each  6 each Topical Daily Cherene Altes, MD   6 each at 12/11/21 1013   enoxaparin (LOVENOX) injection 40 mg  40 mg Subcutaneous Q24H Rise Patience, MD   40 mg at 12/12/21 [id number]   escitalopram (LEXAPRO) tablet 10 mg  10 mg Oral Daily Rise Patience, MD   10 mg at 12/12/21 0927   fluticasone (FLONASE) 50 MCG/ACT nasal spray 2 spray  2 spray Each Nare Daily Rise Patience, MD   2 spray  at 12/12/21 [id number]   gabapentin (NEURONTIN) capsule 100 mg  100 mg Oral TID Rise Patience, MD   100 mg at 12/12/21 [id number]   levothyroxine (SYNTHROID) tablet 50 mcg  50 mcg Oral Q0600 Rise Patience, MD   50 mcg at 12/12/21 0511   LORazepam (ATIVAN) tablet 1 mg  1 mg Oral QHS Rise Patience, MD   1 mg at 12/11/21 2101   magnesium sulfate IVPB 4 g 100 mL  4 g Intravenous Once Georgette Shell, MD       MEDLINE mouth rinse  15 mL Mouth Rinse BID Joette Catching T, MD   15 mL at 12/12/21 0928   melatonin tablet 5 mg  5 mg Oral QHS Rise Patience, MD   5 mg at 12/11/21 2101   metroNIDAZOLE (FLAGYL) IVPB 500 mg  500 mg Intravenous Q8H Joette Catching T, MD 100 mL/hr at 12/12/21 1031 500 mg at 12/12/21 1031   montelukast (SINGULAIR) tablet 10 mg  10 mg Oral Daily Rise Patience, MD   10 mg at 12/12/21 0928   ondansetron (ZOFRAN) injection 4 mg  4 mg Intravenous Q6H PRN Rise Patience, MD   4 mg at 12/10/21 1330   oxyCODONE (Oxy IR/ROXICODONE) immediate release tablet 5-10 mg  5-10 mg Oral Q4H PRN Cherene Altes, MD   10 mg at 12/10/21 1330   traMADol (ULTRAM) tablet 50 mg  50 mg Oral Q6H PRN Cherene Altes, MD   50 mg at 12/09/21 [id number]   vancomycin (VANCOCIN) capsule 125 mg  125 mg Oral QID Cherene Altes, MD   125 mg at 12/12/21 [id number]     Discharge Medications: Please see discharge summary for a list of discharge medications.  Relevant Imaging Results:  Relevant Lab Results:   Additional Information ss# [id number]  covid vaccine pfizer 12/18/19, 11/27/19, pt has had 3 booster shots.  Joanne Chars, LCSW

## 2021-12-12 NOTE — Plan of Care (Signed)
  Problem: Education: Goal: Knowledge of disease and its progression will improve Outcome: Progressing   Problem: Health Behavior/Discharge Planning: Goal: Ability to manage health-related needs will improve Outcome: Progressing   Problem: Clinical Measurements: Goal: Complications related to the disease process or treatment will be avoided or minimized Outcome: Progressing   

## 2021-12-12 NOTE — Progress Notes (Signed)
RT setup patients home CPAP within reach at bedside.  RT assistance available if needed.

## 2021-12-12 NOTE — TOC Initial Note (Addendum)
Transition of Care [facility]) - Initial/Assessment Note    Patient Details  Name: Courtney [name] MRN: [id number] Date of Birth: 1946/06/11  [facility]) CM/SW Contact:    Joanne Chars, LCSW Phone Number: 12/12/2021, 11:06 AM  Clinical Narrative:  Pt from [facility] independent living with husband, discussed SNF recommendation and pt is agreeable and would like to do this at [facility] also.  Permission given to speak with husband and with [employer].  Pt has been receiving PT in her current apartment.  Pt is vaccinated for covid with all 3 boosters.   CSW spoke with [facility] and they can accept pt for STR.  CSW called [employer] and they do not manage this policy for SNF auth.    Claiborne Billings informed and will initiate auth request.                Expected Discharge Plan: Skilled Nursing Facility Barriers to Discharge: Continued Medical Work up   Patient Goals and CMS Choice Patient states their goals for this hospitalization and ongoing recovery are:: being independent again CMS Medicare.gov Compare Post Acute Care list provided to::  (NA-pt is resident at [facility], wants to go there)    Expected Discharge Plan and Services Expected Discharge Plan: [place] In-house Referral: Clinical Social Work   Post Acute Care Choice: [place] Living arrangements for the past 2 months: Materials engineer (at [employer])                                      Prior Living Arrangements/Services Living arrangements for the past 2 months: Materials engineer (at [employer]) Lives with:: Spouse Patient language and need for interpreter reviewed:: Yes Do you feel safe going back to the place where you live?: Yes      Need for Family Participation in Patient Care: Yes (Comment) Care giver support system in place?: Yes (comment) Current home services: Home PT Criminal Activity/Legal Involvement Pertinent to Current  Situation/Hospitalization: No - Comment as needed  Activities of Daily Living Home Assistive Devices/Equipment: Cane (specify quad or straight), Built-in shower seat ADL Screening (condition at time of admission) Patient's cognitive ability adequate to safely complete daily activities?: Yes Is the patient deaf or have difficulty hearing?: No Does the patient have difficulty seeing, even when wearing glasses/contacts?: No Does the patient have difficulty concentrating, remembering, or making decisions?: No Patient able to express need for assistance with ADLs?: Yes Does the patient have difficulty dressing or bathing?: No Independently performs ADLs?: Yes (appropriate for developmental age) Does the patient have difficulty walking or climbing stairs?: Yes Weakness of Legs: Both Weakness of Arms/Hands: None  Permission Sought/Granted Permission sought to share information with : Family Supports Permission granted to share information with : Yes, Verbal Permission Granted  Share Information with NAME: husband Laverna Peace  Permission granted to share info w AGENCY: [employer]        Emotional Assessment Appearance:: Appears stated age Attitude/Demeanor/Rapport: Engaged Affect (typically observed): Appropriate, Pleasant Orientation: : Oriented to Self, Oriented to Place, Oriented to  Time, Oriented to Situation Alcohol / Substance Use: Not Applicable Psych Involvement: No (comment)  Admission diagnosis:  Dehydration [E86.0] ARF (acute renal failure) (HCC) [N17.9] AKI (acute kidney injury) ([place]) [N17.9] Diarrhea, unspecified type [R19.7] Acute diverticulitis of intestine [K57.92] Patient Active Problem List   Diagnosis Date Noted   Diverticulitis 12/07/2021   Acute diverticulitis of intestine 12/07/2021  Diarrhea    ARF (acute renal failure) (HCC) 12/06/2021   Hypokalemia 11/20/2021   COVID-19 infection with colitis 11/18/2021   CAD (coronary artery disease) 11/18/2021   Colitis  with rectal bleeding 11/18/2021   AKI (acute kidney injury) ([place]) 11/18/2021   Aspiration pneumonia of left lower lobe due to regurgitated food ([place]) 04/02/2021   Bipolar disorder ([place]) 04/02/2021   Hypertension 04/02/2021   Hyponatremia 04/02/2021   Dyslipidemia 04/02/2021   Hypothyroidism (acquired) 04/02/2021   OSA (obstructive sleep apnea) 04/02/2021   Cervical spondylosis with myelopathy and radiculopathy 03/28/2021   PCP:  Wenda Low, MD Pharmacy:   [facility] [id number], [place] [address] Phone: [phone] Fax: [phone]  [facility] [place], [place] [address] Phone: [phone] Fax: [phone]     Social Determinants of Health (SDOH) Interventions    Readmission Risk Interventions No flowsheet data found.

## 2021-12-12 NOTE — Progress Notes (Signed)
°  Progress Note   Patient: Courtney [name] [id number] DOB: [birth year] DOA: 12/06/2021     5 DOS: the patient was seen and examined on 12/12/2021   Brief hospital course: 76 year old female with recent COVID diarrhea 11/24/2021 readmitted with persistent recurrent diarrhea for 2 days prior to this admission.  She was found to have C. difficile colitis with hypotension AKI and hypomagnesemia.  Assessment and Plan:  #1 acute C. difficile pan colitis-patient did not improve with empiric treatment for suspected diverticulitis/colitis.  Repeat CT of the abdomen and pelvis showed progressive pancolitis.  C. difficile testing was positive and she was started on oral vancomycin and IV Flagyl. Continue the same.  #2 AKI resolved with volume resuscitation.  #3 cough with dyspnea on exertion check chest x-ray.  May need low-dose diuretics.  Continue Proventil and Pulmicort.  Continue Singulair and Flonase.  #4 hypotension resolved with massive fluid resuscitation.  Blood pressure 101/64.  #5 hypomagnesemia/hypokalemia -mag still low at 1.6 Potassium 3.2 replete   #6 leukocytosis White count down to 18.3 from 31  #6 hypothyroidism continue Synthroid  #7 hypertension was on verapamil 360 mg daily which has been on hold due to hypotension.  Her blood pressure is improving but still on the low side soft side.  116/66.  We will continue to hold verapamil.  #8 depression continue Wellbutrin and Lexapro  #9 hyperlipidemia was on Lipitor 80 mg prior to admission will restart.  #10 obesity with BMI of 38.31  #11 disposition seen by PT recommending SNF.  She comes from [place].  Will discharge to [facility] once medically ready.   Subjective: Reports having a cough for the last few days and short of breath with activity.  She reports she has been short of breath with activity even prior to admission.  Rectal tube was dislodged earlier this morning and she had no diarrhea ever since which has been  only couple of hours.  Physical Exam: Vitals:   12/11/21 2357 12/12/21 0458 12/12/21 0708 12/12/21 0800  BP: 101/64 116/76 116/66   Pulse: 89 88 87 87  Resp: 17 14 16  (!) 23  Temp: 98 F (36.7 C) 98.4 F (36.9 C) (!) 97.5 F (36.4 C)   TempSrc: Axillary Oral Oral   SpO2: 96% 92% 94% 93%  Weight:      Height:       General frail elderly female in no acute distress Chest few scattered rhonchi CVS regular rate and rhythm Abdomen distended soft mild diffuse tenderness noted Extremities 1+ bilateral pitting edema  Data Reviewed: WBC 18.3 hemoglobin 10.3 BUN 5 creatinine 0.57 magnesium 1.6 potassium 3.2. Chest x-ray pending Family Communication: None at bedside  Disposition: Status is: Inpatient  Remains inpatient appropriate because: C. difficile colitis with hypotension leukocytosis with ongoing diarrhea and electrolyte abnormalities    Planned Discharge Destination: Skilled nursing facility DVT prophylaxis Lovenox Time spent: 39 minutes Author: Georgette Shell, MD 12/12/2021 9:32 AM  For on call review [email or web address].

## 2021-12-12 NOTE — Progress Notes (Signed)
RT note. Patient placed on home cpap with 1 l bled in line. Upon arrival patient sat 90% on rm air, patient asked to go on cpap . Told patient if she wants to just wear [place] let RN know. RT will continue to monitor.

## 2021-12-13 DIAGNOSIS — N179 Acute kidney failure, unspecified: Secondary | ICD-10-CM | POA: Diagnosis not present

## 2021-12-13 LAB — MAGNESIUM: Magnesium: 1.9 mg/dL (ref 1.7–2.4)

## 2021-12-13 LAB — MRSA NEXT GEN BY PCR, NASAL: MRSA by PCR Next Gen: DETECTED — AB

## 2021-12-13 LAB — BASIC METABOLIC PANEL
Anion gap: 7 (ref 5–15)
BUN: <5 mg/dL — ABNORMAL LOW (ref 8–23)
CO2: 29 mmol/L (ref 22–32)
Calcium: 8.1 mg/dL — ABNORMAL LOW (ref 8.9–10.3)
Chloride: 98 mmol/L (ref 98–111)
Creatinine, Ser: 0.58 mg/dL (ref 0.44–1.00)
GFR, Estimated: >60 mL/min (ref >60)
Glucose, Bld: 83 mg/dL (ref 70–99)
Potassium: 3.1 mmol/L — ABNORMAL LOW (ref 3.5–5.1)
Sodium: 134 mmol/L — ABNORMAL LOW (ref 135–145)

## 2021-12-13 LAB — CBC
HCT: 34.2 % — ABNORMAL LOW (ref 36.0–46.0)
Hemoglobin: 11.1 g/dL — ABNORMAL LOW (ref 12.0–15.0)
MCH: 28.4 pg (ref 26.0–34.0)
MCHC: 32.5 g/dL (ref 30.0–36.0)
MCV: 87.5 fL (ref 80.0–100.0)
Platelets: 351 10*3/uL (ref 150–400)
RBC: 3.91 MIL/uL (ref 3.87–5.11)
RDW: 14.6 % (ref 11.5–15.5)
WBC: 15.4 10*3/uL — ABNORMAL HIGH (ref 4.0–10.5)
nRBC: 0 % (ref 0.0–0.2)

## 2021-12-13 MED ORDER — ALBUTEROL SULFATE (2.5 MG/3ML) 0.083% IN NEBU
2.5000 mg | INHALATION_SOLUTION | Freq: Four times a day (QID) | RESPIRATORY_TRACT | Status: DC
  Administered 2021-12-13 – 2021-12-16 (×14): 2.5 mg via RESPIRATORY_TRACT
  Filled 2021-12-13 (×14): qty 3

## 2021-12-13 MED ORDER — ONDANSETRON 4 MG PO TBDP
4.0000 mg | ORAL_TABLET | Freq: Three times a day (TID) | ORAL | Status: DC | PRN
  Administered 2021-12-13: 4 mg via ORAL
  Filled 2021-12-13: qty 1

## 2021-12-13 MED ORDER — CHLORHEXIDINE GLUCONATE CLOTH 2 % EX PADS
6.0000 | MEDICATED_PAD | Freq: Every day | CUTANEOUS | Status: AC
  Administered 2021-12-13 – 2021-12-17 (×5): 6 via TOPICAL

## 2021-12-13 MED ORDER — VERAPAMIL HCL 120 MG PO TABS
120.0000 mg | ORAL_TABLET | Freq: Every day | ORAL | Status: DC
  Administered 2021-12-13 – 2021-12-18 (×6): 120 mg via ORAL
  Filled 2021-12-13 (×6): qty 1

## 2021-12-13 MED ORDER — POTASSIUM CHLORIDE CRYS ER 20 MEQ PO TBCR
40.0000 meq | EXTENDED_RELEASE_TABLET | ORAL | Status: AC
  Administered 2021-12-13 (×2): 40 meq via ORAL
  Filled 2021-12-13 (×2): qty 2

## 2021-12-13 MED ORDER — FUROSEMIDE 10 MG/ML IJ SOLN
20.0000 mg | Freq: Every day | INTRAMUSCULAR | Status: AC
  Administered 2021-12-13 – 2021-12-15 (×3): 20 mg via INTRAVENOUS
  Filled 2021-12-13 (×3): qty 2

## 2021-12-13 MED ORDER — CHLORHEXIDINE GLUCONATE CLOTH 2 % EX PADS: 6.0000 | MEDICATED_PAD | Freq: Every day | CUTANEOUS | Status: DC

## 2021-12-13 MED ORDER — SIMETHICONE 80 MG PO CHEW
80.0000 mg | CHEWABLE_TABLET | Freq: Four times a day (QID) | ORAL | Status: DC
  Administered 2021-12-13 – 2021-12-18 (×21): 80 mg via ORAL
  Filled 2021-12-13 (×21): qty 1

## 2021-12-13 MED ORDER — MUPIROCIN 2 % EX OINT
1.0000 "application " | TOPICAL_OINTMENT | Freq: Two times a day (BID) | CUTANEOUS | Status: AC
  Administered 2021-12-13 – 2021-12-17 (×10): 1 via NASAL
  Filled 2021-12-13 (×5): qty 22

## 2021-12-13 NOTE — Plan of Care (Signed)
  Problem: Education: Goal: Knowledge of disease and its progression will improve Outcome: Progressing   Problem: Health Behavior/Discharge Planning: Goal: Ability to manage health-related needs will improve Outcome: Progressing   Problem: Clinical Measurements: Goal: Complications related to the disease process or treatment will be avoided or minimized Outcome: Progressing   

## 2021-12-13 NOTE — Progress Notes (Signed)
Mobility Specialist: Progress Note   12/13/21 1733  Mobility  Bed Position Chair  Activity Transferred from bed to chair  Level of Assistance Moderate assist, patient does 50-74%  Assistive Device Stedy  Activity Response Tolerated well  $Mobility charge 1 Mobility   Pt assisted to recliner to sit up for her dinner, no c/o throughout. Pt has call bell at her side and pt's husband present in the room.   [facility] Twylla Arceneaux Mobility Specialist Mobility Specialist 5 North: [phone] Mobility Specialist 6 North: [phone]

## 2021-12-13 NOTE — Progress Notes (Signed)
IVT consulted for difficult PIV placement.  Pt doesn't currently have access.  Upon arrival, pt in chair.  RN to have placed in bed and will reconsult IV Team.

## 2021-12-13 NOTE — Progress Notes (Signed)
°  Progress Note   Patient: Courtney [name] [id number] DOB: December 15, 1945 DOA: 12/06/2021     6 DOS: the patient was seen and examined on 12/13/2021   Brief hospital course: 76 year old female with recent COVID diarrhea 11/24/2021 readmitted with persistent recurrent diarrhea for 2 days prior to this admission.  She was found to have C. difficile colitis with hypotension AKI and hypomagnesemia.  Assessment and Plan:  #1 acute C. difficile pan colitis-patient did not improve with empiric treatment for suspected diverticulitis/colitis.  Repeat CT of the abdomen and pelvis showed progressive pancolitis.  C. difficile testing was positive and she was started on oral vancomycin and IV Flagyl. Continue the same. Leukocytosis improving.  #2 AKI resolved with volume resuscitation.  #3 cough with dyspnea on exertion-chest x-ray with atelectasis.  Encourage patient to use incentive spirometer.  Lasix 20 mg daily for 3 days.  Continue Proventil and Pulmicort Singulair and Flonase and albuterol nebulizer.  Encourage out of bed ambulate and DC Foley today.  #4 hypotension resolved with massive fluid resuscitation.  Blood pressure 144/78.  #5 hypomagnesemia/hypokalemia -replete and recheck.  #6 leukocytosis improving.  #6 hypothyroidism continue Synthroid  #7 hypertension restart verapamil.    #8 depression continue Wellbutrin and Lexapro  #9 hyperlipidemia was on Lipitor 80 mg prior to admission will restart.  #10 obesity with BMI of 38.31  #11 disposition seen by PT recommending SNF.  She comes from [place].  Will discharge to [facility] snf  once medically ready.  #12 acute urinary retention will attempt to DC Foley today.   Subjective: Patient complaining of shortness of breath with activity.   Her diarrhea is decreasing she had 1 episode of diarrhea overnight.   She still have Foley catheter in place which was placed for acute urinary retention.   She complains of a lot of gas and  nausea.   Physical Exam: Vitals:   12/12/21 2342 12/13/21 0335 12/13/21 0715 12/13/21 0729  BP: 127/67 134/80 (!) 144/78   Pulse: 89 85 84 87  Resp: 13 15 16 18   Temp: 98.2 F (36.8 C) 98 F (36.7 C) (!) 97.5 F (36.4 C)   TempSrc: Oral Oral Oral   SpO2: 93% 93% 96% 94%  Weight:      Height:       General frail elderly female in no acute distress Chest few scattered rhonchi CVS regular rate and rhythm Abdomen distended soft mild diffuse tenderness noted Extremities 1+ bilateral pitting edema  Data Reviewed: WBC 15.4 hemoglobin 11.1  BUN 5 creatinine 0.57 magnesium 1.9 potassium 3.1 Chest x-ray atelectasis  Family Communication: None at bedside  Disposition: Status is: Inpatient  Remains inpatient appropriate because: C. difficile colitis with hypotension leukocytosis with ongoing diarrhea and electrolyte abnormalities    Planned Discharge Destination: Skilled nursing facility DVT prophylaxis Lovenox Time spent: 39 minutes Author: Georgette Shell, MD 12/13/2021 11:46 AM  For on call review [email or web address].

## 2021-12-14 DIAGNOSIS — N179 Acute kidney failure, unspecified: Secondary | ICD-10-CM | POA: Diagnosis not present

## 2021-12-14 LAB — CBC
HCT: 32.7 % — ABNORMAL LOW (ref 36.0–46.0)
Hemoglobin: 10.7 g/dL — ABNORMAL LOW (ref 12.0–15.0)
MCH: 28.8 pg (ref 26.0–34.0)
MCHC: 32.7 g/dL (ref 30.0–36.0)
MCV: 87.9 fL (ref 80.0–100.0)
Platelets: 380 10*3/uL (ref 150–400)
RBC: 3.72 MIL/uL — ABNORMAL LOW (ref 3.87–5.11)
RDW: 14.6 % (ref 11.5–15.5)
WBC: 11.9 10*3/uL — ABNORMAL HIGH (ref 4.0–10.5)
nRBC: 0 % (ref 0.0–0.2)

## 2021-12-14 LAB — COMPREHENSIVE METABOLIC PANEL
ALT: 8 U/L (ref 0–44)
AST: 18 U/L (ref 15–41)
Albumin: 1.6 g/dL — ABNORMAL LOW (ref 3.5–5.0)
Alkaline Phosphatase: 62 U/L (ref 38–126)
Anion gap: 9 (ref 5–15)
BUN: <5 mg/dL — ABNORMAL LOW (ref 8–23)
CO2: 29 mmol/L (ref 22–32)
Calcium: 8 mg/dL — ABNORMAL LOW (ref 8.9–10.3)
Chloride: 98 mmol/L (ref 98–111)
Creatinine, Ser: 0.59 mg/dL (ref 0.44–1.00)
GFR, Estimated: >60 mL/min (ref >60)
Glucose, Bld: 85 mg/dL (ref 70–99)
Potassium: 3.3 mmol/L — ABNORMAL LOW (ref 3.5–5.1)
Sodium: 136 mmol/L (ref 135–145)
Total Bilirubin: 0.1 mg/dL — ABNORMAL LOW (ref 0.3–1.2)
Total Protein: 4.3 g/dL — ABNORMAL LOW (ref 6.5–8.1)

## 2021-12-14 LAB — MAGNESIUM: Magnesium: 1.6 mg/dL — ABNORMAL LOW (ref 1.7–2.4)

## 2021-12-14 MED ORDER — MAGNESIUM SULFATE 4 GM/100ML IV SOLN
4.0000 g | Freq: Once | INTRAVENOUS | Status: AC
  Administered 2021-12-14: 4 g via INTRAVENOUS
  Filled 2021-12-14: qty 100

## 2021-12-14 MED ORDER — SODIUM CHLORIDE 0.9 % IV SOLN
12.5000 mg | Freq: Four times a day (QID) | INTRAVENOUS | Status: DC | PRN
  Administered 2021-12-14: 12.5 mg via INTRAVENOUS
  Filled 2021-12-14 (×2): qty 0.5

## 2021-12-14 MED ORDER — POTASSIUM CHLORIDE CRYS ER 20 MEQ PO TBCR
40.0000 meq | EXTENDED_RELEASE_TABLET | Freq: Every day | ORAL | Status: AC
  Administered 2021-12-14 – 2021-12-16 (×3): 40 meq via ORAL
  Filled 2021-12-14 (×3): qty 2

## 2021-12-14 MED ORDER — SACCHAROMYCES BOULARDII 250 MG PO CAPS
250.0000 mg | ORAL_CAPSULE | Freq: Two times a day (BID) | ORAL | Status: DC
  Administered 2021-12-14 – 2021-12-18 (×9): 250 mg via ORAL
  Filled 2021-12-14 (×9): qty 1

## 2021-12-14 NOTE — Progress Notes (Signed)
Physical Therapy Treatment Patient Details Name: Courtney [name] MRN: [id number] DOB: 1945/11/01 Today's Date: 12/14/2021   History of Present Illness Patient is 76 y.o. female recently admitted w/ COVID and symptoms of SOB and diarrhea on 11/16/21 and discharged 11/24/21. She has returned home with HHPT services. Pt returning to [facility] on 2/16 for recurrence of diarrhea 2 days prior. In the ED she was hypotensive w/ SBP in the 70s. She was also in AKI w/ hypomagnesemia. CT abdom/pelvis noted diverticulitis and colitis.  PMH significant for bipolar disorder, HTN, mitral valve prolapse, claustrophobia, CVA, vertigo, PE, prior back surgery, B knee replacements.    PT Comments    Pt received in supine and agreeable to session focused on transfer training with emphasis on sitting balance with good tolerance and good progress. Pt able to come to sitting EOB with mod-max assist for LE management and to elevate trunk. Once EOB pt continued to laterally lean R and posteriorly. PTA seated on pt R with pt RUE extended to self prop and overpressure applied to facilitate BUE propped sitting. Pt able to maintain BUE propped upright posture throughout duration of session for LE exercise to increase ROM. Pt able to complete activities outside seated BOS as described below to facilitate trunk engagement and progress seated balance pre transfer. Pt continues to benefit from skilled PT services to progress toward functional mobility goals.    Recommendations for follow up therapy are one component of a multi-disciplinary discharge planning process, led by the attending physician.  Recommendations may be updated based on patient status, additional functional criteria and insurance authorization.  Follow Up Recommendations  Skilled nursing-short term rehab (<3 hours/day)     Assistance Recommended at Discharge Frequent or constant Supervision/Assistance  Patient can return home with the following Two people to help with  walking and/or transfers;Two people to help with bathing/dressing/bathroom;Assistance with cooking/housework;Assistance with feeding;Help with stairs or ramp for entrance;Assist for transportation;Direct supervision/assist for medications management   Equipment Recommendations  None recommended by PT    Recommendations for Other Services OT consult     Precautions / Restrictions Precautions Precautions: Fall Precaution Comments: flexiseal Restrictions Weight Bearing Restrictions: No     Mobility  Bed Mobility Overal bed mobility: Needs Assistance Bed Mobility: Supine to Sit, Sit to Supine     Supine to sit: HOB elevated, Mod assist, Max assist Sit to supine: Mod assist, Max assist   General bed mobility comments: Cues for use of bed rail and to walk bil LE's off EOB. mod-max assist to bring trunk upright from sidelying    Transfers                        Ambulation/Gait                   Stairs             Wheelchair Mobility    Modified Rankin (Stroke Patients Only)       Balance Overall balance assessment: Needs assistance Sitting-balance support: Feet supported, Bilateral upper extremity supported Sitting balance-Leahy Scale: Poor Sitting balance - Comments: pt reliant on UE and close CGA to Mod assist to maintain seated balance. Postural control: Posterior lean, Right lateral lean Standing balance support: Bilateral upper extremity supported, Reliant on assistive device for balance Standing balance-Leahy Scale: Poor  Cognition Arousal/Alertness: Awake/alert Behavior During Therapy: WFL for tasks assessed/performed Overall Cognitive Status: Within Functional Limits for tasks assessed                                 General Comments: pt pleasant and agreeable to therapy despite timeup in recliner today and overall pt fatigue        Exercises General Exercises - Lower  Extremity Ankle Circles/Pumps: AROM, Both, 10 reps Long Arc Quad: AROM, Both, 10 reps, Seated Hip Flexion/Marching: AROM, Both, 20 reps, Seated Toe Raises: AROM, Both, 10 reps, Seated Heel Raises: AROM, 10 reps, Seated, Both Other Exercises Other Exercises: staic sitting with over pressure on RLE hand and elbow to maintain center Other Exercises: BUE on PTA shoulders rocking and pushing PTA away (with PTA giving graded restistance with body back) anteriorly and laterally to L as pt has tendency to lean and push R. ~5 mins for pre sit to stand trunk engagement and to facilitate improved sitting balance    General Comments General comments (skin integrity, edema, etc.): VSS on RA      Pertinent Vitals/Pain Pain Assessment Pain Assessment: Faces Faces Pain Scale: Hurts a little bit Pain Location: generalized, abdomen, feet Pain Descriptors / Indicators: Aching, Discomfort Pain Intervention(s): Limited activity within patient's tolerance, Monitored during session, Repositioned    Home Living                          Prior Function            PT Goals (current goals can now be found in the care plan section) Acute Rehab PT Goals Patient Stated Goal: get stronger to get walking again PT Goal Formulation: With patient/family Time For Goal Achievement: 12/25/21 Potential to Achieve Goals: Good    Frequency    Min 3X/week      PT Plan      Co-evaluation              AM-PAC PT "6 Clicks" Mobility   Outcome Measure  Help needed turning from your back to your side while in a flat bed without using bedrails?: A Lot Help needed moving from lying on your back to sitting on the side of a flat bed without using bedrails?: Total Help needed moving to and from a bed to a chair (including a wheelchair)?: Total Help needed standing up from a chair using your arms (e.g., wheelchair or bedside chair)?: A Lot Help needed to walk in hospital room?: Total Help needed  climbing 3-5 steps with a railing? : Total 6 Click Score: 8    End of Session Equipment Utilized During Treatment: Gait belt;Oxygen Activity Tolerance: Patient tolerated treatment well;Patient limited by fatigue Patient left: with call bell/phone within reach;in bed;with bed alarm set;with family/visitor present Nurse Communication: Mobility status PT Visit Diagnosis: Unsteadiness on feet (R26.81);Muscle weakness (generalized) (M62.81)     Time: [id number] PT Time Calculation (min) (ACUTE ONLY): 25 min  Charges:  $Therapeutic Activity: 23-37 mins                     Ahlaya Ende R. PTA Acute Rehabilitation Services Office: [place] 12/14/2021, 4:06 PM

## 2021-12-14 NOTE — Progress Notes (Signed)
Mobility Specialist Progress Note    12/14/21 1328  Mobility  Bed Position Chair  Activity Transferred from bed to chair  Level of Assistance +2 (takes two people)  Assistive Device Stedy  Activity Response Tolerated well  $Mobility charge 1 Mobility   Pt received and agreeable. No complaints. Left with call bell in reach and husband present.   [facility] Mobility Specialist  M.S. 5N: [phone]

## 2021-12-14 NOTE — TOC Progression Note (Signed)
Transition of Care [facility]) - Progression Note    Patient Details  Name: TALEEYA BLONDIN MRN: [id number] Date of Birth: 06/16/1946  Transition of [facility]) CM/SW Contact  Lorri Frederick, LCSW Phone Number: 12/14/2021, 11:50 AM  Clinical Narrative:   Tresa Endo at [facility] has auth through 2/28.  Pt cannot admit over the weekend.    Expected Discharge Plan: Skilled Nursing Facility Barriers to Discharge: Continued Medical Work up  Expected Discharge Plan and Services Expected Discharge Plan: Skilled Nursing Facility In-house Referral: Clinical Social Work   Post Acute Care Choice: Skilled Nursing Facility Living arrangements for the past 2 months: Marketing executive (at [employer])                                       Social Determinants of Health (SDOH) Interventions    Readmission Risk Interventions No flowsheet data found.

## 2021-12-14 NOTE — Plan of Care (Signed)
°  Problem: Education: Goal: Knowledge of disease and its progression will improve Outcome: Adequate for Discharge   Problem: Health Behavior/Discharge Planning: Goal: Ability to manage health-related needs will improve Outcome: Adequate for Discharge   Problem: Clinical Measurements: Goal: Complications related to the disease process or treatment will be avoided or minimized Outcome: Adequate for Discharge

## 2021-12-14 NOTE — Progress Notes (Signed)
°  Progress Note   Patient: Courtney [name] [id number] DOB: Sep 27, 1946 DOA: 12/06/2021     7 DOS: the patient was seen and examined on 12/14/2021   Brief hospital course: 76 year old female with recent COVID diarrhea 11/24/2021 readmitted with persistent recurrent diarrhea for 2 days prior to this admission.  She was found to have C. difficile colitis with hypotension AKI and hypomagnesemia.  Assessment and Plan:  #1 acute C. difficile pan colitis-patient did not improve with empiric treatment for suspected diverticulitis/colitis.  Repeat CT of the abdomen and pelvis showed progressive pancolitis.  C. difficile testing was positive and she was started on oral vancomycin and IV Flagyl. Continue the same. Leukocytosis improving. She continues to have diarrhea.  She reports 5 loose BMs last night.  #2 AKI resolved with volume resuscitation.  #3 cough with dyspnea on exertion-chest x-ray with atelectasis.  Encourage patient to use incentive spirometer.  Lasix 20 mg daily for 3 days.  Continue Proventil and Pulmicort Singulair and Flonase and albuterol nebulizer.   #4 hypotension resolved with massive fluid resuscitation.  Blood pressure 144/78.  #5 hypomagnesemia/hypokalemia -potassium 3.3 and magnesium 1.6 replete.  #6 leukocytosis improving.  #6 hypothyroidism continue Synthroid  #7 hypertension restart verapamil.    #8 depression continue Wellbutrin and Lexapro  #9 hyperlipidemia was on Lipitor 80 mg prior to admission will restart.  #10 obesity with BMI of 38.31  #11 disposition seen by PT recommending SNF.  She comes from [place].  Will discharge to [facility] snf  once medically ready.  #12 acute urinary retention Foley was taken out on 12/13/2021 and she is having urine output.  Subjective: She is resting in bed she reports 5 large loose BMs yesterday.  And overnight her breathing seems to be better with Lasix and breathing treatments.  I have ordered incentive spirometer  again. Physical Exam: Vitals:   12/14/21 0510 12/14/21 0700 12/14/21 0814 12/14/21 0816  BP: 117/73 129/77    Pulse: 87     Resp: 19 18    Temp:  97.7 F (36.5 C)    TempSrc:  Oral    SpO2: 93%  92% 94%  Weight:      Height:       General frail elderly female in no acute distress Chest few scattered rhonchi CVS regular rate and rhythm Abdomen distended soft mild diffuse tenderness noted Extremities 1+ bilateral pitting edema  Data Reviewed: WBC 15.4 hemoglobin 11.1  BUN 5 creatinine 0.57 magnesium 1.9 potassium 3.1 Chest x-ray atelectasis  Family Communication: dw husband  Disposition: Status is: Inpatient  Remains inpatient appropriate because: C. difficile colitis with hypotension leukocytosis with ongoing diarrhea and electrolyte abnormalities    Planned Discharge Destination: Skilled nursing facility DVT prophylaxis Lovenox Time spent: 39 minutes Author: Georgette Shell, MD 12/14/2021 10:22 AM  For on call review [email or web address].

## 2021-12-15 DIAGNOSIS — N179 Acute kidney failure, unspecified: Secondary | ICD-10-CM | POA: Diagnosis not present

## 2021-12-15 LAB — CBC
HCT: 32.2 % — ABNORMAL LOW (ref 36.0–46.0)
Hemoglobin: 10.5 g/dL — ABNORMAL LOW (ref 12.0–15.0)
MCH: 28.8 pg (ref 26.0–34.0)
MCHC: 32.6 g/dL (ref 30.0–36.0)
MCV: 88.2 fL (ref 80.0–100.0)
Platelets: 396 10*3/uL (ref 150–400)
RBC: 3.65 MIL/uL — ABNORMAL LOW (ref 3.87–5.11)
RDW: 14.6 % (ref 11.5–15.5)
WBC: 12.7 10*3/uL — ABNORMAL HIGH (ref 4.0–10.5)
nRBC: 0.2 % (ref 0.0–0.2)

## 2021-12-15 LAB — BASIC METABOLIC PANEL
Anion gap: 8 (ref 5–15)
BUN: <5 mg/dL — ABNORMAL LOW (ref 8–23)
CO2: 31 mmol/L (ref 22–32)
Calcium: 7.8 mg/dL — ABNORMAL LOW (ref 8.9–10.3)
Chloride: 97 mmol/L — ABNORMAL LOW (ref 98–111)
Creatinine, Ser: 0.7 mg/dL (ref 0.44–1.00)
GFR, Estimated: >60 mL/min (ref >60)
Glucose, Bld: 94 mg/dL (ref 70–99)
Potassium: 3.5 mmol/L (ref 3.5–5.1)
Sodium: 136 mmol/L (ref 135–145)

## 2021-12-15 LAB — MAGNESIUM: Magnesium: 2 mg/dL (ref 1.7–2.4)

## 2021-12-15 NOTE — Progress Notes (Signed)
°  Progress Note   Patient: Courtney [name] [id number] DOB: [birth year] DOA: 12/06/2021     8 DOS: the patient was seen and examined on 12/15/2021   Brief hospital course: 76 year old female with recent COVID diarrhea 11/24/2021 readmitted with persistent recurrent diarrhea for 2 days prior to this admission.  She was found to have C. difficile colitis with hypotension AKI and hypomagnesemia.  Assessment and Plan:  #1 acute C. difficile pan colitis-patient did not improve with empiric treatment for suspected diverticulitis/colitis.  Repeat CT of the abdomen and pelvis showed progressive pancolitis.  C. difficile testing was positive and she was started on oral vancomycin and IV Flagyl. Continue the same. Leukocytosis improving. She continues to have diarrhea.  She reported 7 loose BMs yesterday so a rectal tube was placed.  #2 AKI resolved with volume resuscitation.  #3 cough with dyspnea on exertion-chest x-ray with atelectasis.  Encourage patient to use incentive spirometer.  Lasix 20 mg daily for 3 days.  Continue Proventil and Pulmicort Singulair and Flonase and albuterol nebulizer.   #4 hypotension resolved with massive fluid resuscitation.  Blood pressure 144/78.  #5 hypomagnesemia/hypokalemia -potassium 3.3 and magnesium 1.6 replete.  #6 leukocytosis improving.  #6 hypothyroidism continue Synthroid  #7 hypertension restart verapamil.    #8 depression continue Wellbutrin and Lexapro  #9 hyperlipidemia was on Lipitor 80 mg prior to admission will restart.  #10 obesity with BMI of 38.31  #11 disposition seen by PT recommending SNF.  She comes from [place].  Will discharge to [facility] snf  once medically ready.  #12 acute urinary retention Foley was taken out on 12/13/2021 and she is having urine output.  Subjective:   Patient is resting in bed she reports having 7 loose BMs yesterday rectal tube was placed towards the end of the day yesterday.  She continues to have  nausea but no vomiting.  Her breathing is better.  Her p.o. intake is very poor. Physical Exam: Vitals:   12/15/21 0517 12/15/21 0727 12/15/21 0925 12/15/21 0951  BP: 120/68 127/67  132/84  Pulse: 79 83    Resp: 20 13    Temp:  98.1 F (36.7 C)    TempSrc:  Oral    SpO2: 92% 95% 92%   Weight:      Height:       General frail elderly female in no acute distress Chest few scattered rhonchi CVS regular rate and rhythm Abdomen distended soft mild diffuse tenderness noted Extremities 1+ bilateral pitting edema  Data Reviewed: WBC 15.4 hemoglobin 11.1  BUN 5 creatinine 0.57 magnesium 1.9 potassium 3.1 Chest x-ray atelectasis  Family Communication: dw husband  Disposition: Status is: Inpatient  Remains inpatient appropriate because: C. difficile colitis with hypotension leukocytosis with ongoing diarrhea and electrolyte abnormalities    Planned Discharge Destination: Skilled nursing facility DVT prophylaxis Lovenox Time spent: 39 minutes Author: Georgette Shell, MD 12/15/2021 12:52 PM  For on call review [email or web address].

## 2021-12-16 ENCOUNTER — Inpatient Hospital Stay (HOSPITAL_COMMUNITY): Payer: Medicare HMO

## 2021-12-16 DIAGNOSIS — N179 Acute kidney failure, unspecified: Secondary | ICD-10-CM | POA: Diagnosis not present

## 2021-12-16 LAB — CBC
HCT: 32.3 % — ABNORMAL LOW (ref 36.0–46.0)
Hemoglobin: 10.5 g/dL — ABNORMAL LOW (ref 12.0–15.0)
MCH: 28.8 pg (ref 26.0–34.0)
MCHC: 32.5 g/dL (ref 30.0–36.0)
MCV: 88.7 fL (ref 80.0–100.0)
Platelets: 374 10*3/uL (ref 150–400)
RBC: 3.64 MIL/uL — ABNORMAL LOW (ref 3.87–5.11)
RDW: 14.8 % (ref 11.5–15.5)
WBC: 12 10*3/uL — ABNORMAL HIGH (ref 4.0–10.5)
nRBC: 0.2 % (ref 0.0–0.2)

## 2021-12-16 LAB — BASIC METABOLIC PANEL
Anion gap: 6 (ref 5–15)
BUN: <5 mg/dL — ABNORMAL LOW (ref 8–23)
CO2: 35 mmol/L — ABNORMAL HIGH (ref 22–32)
Calcium: 8 mg/dL — ABNORMAL LOW (ref 8.9–10.3)
Chloride: 95 mmol/L — ABNORMAL LOW (ref 98–111)
Creatinine, Ser: 0.66 mg/dL (ref 0.44–1.00)
GFR, Estimated: >60 mL/min (ref >60)
Glucose, Bld: 110 mg/dL — ABNORMAL HIGH (ref 70–99)
Potassium: 3.2 mmol/L — ABNORMAL LOW (ref 3.5–5.1)
Sodium: 136 mmol/L (ref 135–145)

## 2021-12-16 LAB — MAGNESIUM: Magnesium: 1.6 mg/dL — ABNORMAL LOW (ref 1.7–2.4)

## 2021-12-16 MED ORDER — ALBUTEROL SULFATE (2.5 MG/3ML) 0.083% IN NEBU
2.5000 mg | INHALATION_SOLUTION | Freq: Three times a day (TID) | RESPIRATORY_TRACT | Status: DC
  Administered 2021-12-17 – 2021-12-18 (×4): 2.5 mg via RESPIRATORY_TRACT
  Filled 2021-12-16 (×4): qty 3

## 2021-12-16 MED ORDER — MAGNESIUM SULFATE 4 GM/100ML IV SOLN
4.0000 g | Freq: Once | INTRAVENOUS | Status: AC
  Administered 2021-12-16: 4 g via INTRAVENOUS
  Filled 2021-12-16: qty 100

## 2021-12-16 MED ORDER — POTASSIUM CHLORIDE CRYS ER 20 MEQ PO TBCR
40.0000 meq | EXTENDED_RELEASE_TABLET | Freq: Once | ORAL | Status: AC
  Administered 2021-12-16: 40 meq via ORAL
  Filled 2021-12-16: qty 2

## 2021-12-16 MED ORDER — ALBUTEROL SULFATE (2.5 MG/3ML) 0.083% IN NEBU: 2.5000 mg | INHALATION_SOLUTION | Freq: Four times a day (QID) | RESPIRATORY_TRACT | Status: DC | PRN

## 2021-12-16 NOTE — Progress Notes (Signed)
°  Progress Note   Patient: Courtney [name] [id number] DOB: May 13, 1946 DOA: 12/06/2021     9 DOS: the patient was seen and examined on 12/16/2021   Brief hospital course: 76 year old female with recent COVID diarrhea 11/24/2021 readmitted with persistent recurrent diarrhea for 2 days prior to this admission.  She was found to have C. difficile colitis with hypotension AKI and hypomagnesemia.  Assessment and Plan:  #1 acute C. difficile pan colitis-patient did not improve with empiric treatment for suspected diverticulitis/colitis.  Repeat CT of the abdomen and pelvis showed progressive pancolitis.  C. difficile testing was positive and she was started on oral vancomycin and IV Flagyl. Continue the same. Leukocytosis improving. She continues to have diarrhea.  She reported 7 loose BMs yesterday so a rectal tube was placed.  #2 AKI resolved with volume resuscitation.  #3 cough with dyspnea on exertion-chest x-ray with atelectasis.  Encourage patient to use incentive spirometer.  Lasix 20 mg daily for 3 days.  Continue Proventil and Pulmicort Singulair and Flonase and albuterol nebulizer.   #4 hypotension resolved with massive fluid resuscitation.  Blood pressure 144/78.  #5 hypomagnesemia/hypokalemia -potassium 3.3 and magnesium 1.6 replete.  #6 leukocytosis improving.  #6 hypothyroidism continue Synthroid  #7 hypertension restart verapamil.    #8 depression continue Wellbutrin and Lexapro  #9 hyperlipidemia was on Lipitor 80 mg prior to admission will restart.  #10 obesity with BMI of 38.31  #11 disposition seen by PT recommending SNF.  She comes from [place].  Will discharge to [facility] snf  once medically ready.  #12 acute urinary retention Foley was taken out on 12/13/2021 and she is having urine output.  Subjective:  She was getting a breathing treatment when I saw her.  Her rectal tube was empty.  Overnight staff also did not report if she had any diarrhea.  She  continues to be nauseous and very weak. Physical Exam: Vitals:   12/16/21 0217 12/16/21 0500 12/16/21 0802 12/16/21 0900  BP:  (!) 132/92    Pulse: 78 77  84  Resp: 13   15  Temp:  98.2 F (36.8 C)    TempSrc:  Oral    SpO2: 95%  94% 93%  Weight:      Height:       General frail elderly female in no acute distress Chest few scattered rhonchi CVS regular rate and rhythm Abdomen distended soft mild diffuse tenderness noted Extremities 1+ bilateral pitting edema  Data Reviewed: WBC 15.4 hemoglobin 11.1  BUN 5 creatinine 0.57 magnesium 1.9 potassium 3.1 Chest x-ray atelectasis  Family Communication: dw husband  Disposition: Status is: Inpatient  Remains inpatient appropriate because: C. difficile colitis with hypotension leukocytosis with ongoing diarrhea and electrolyte abnormalities    Planned Discharge Destination: Skilled nursing facility DVT prophylaxis Lovenox Time spent: 39 minutes Author: Georgette Shell, MD 12/16/2021 1:00 PM  For on call review [email or web address].

## 2021-12-16 NOTE — Progress Notes (Signed)
Patient has home CPAP and places herself on when ready. 2L O2 bled into circuit. RT will monitor as needed.

## 2021-12-16 NOTE — Progress Notes (Signed)
Patient moved from chair to bathroom with 2-person assist and steady.  Patient requesting external catheter due to limited notice with urination.  MD notified.

## 2021-12-17 DIAGNOSIS — N179 Acute kidney failure, unspecified: Secondary | ICD-10-CM | POA: Diagnosis not present

## 2021-12-17 LAB — CBC
HCT: 38.5 % (ref 36.0–46.0)
Hemoglobin: 12.3 g/dL (ref 12.0–15.0)
MCH: 28.3 pg (ref 26.0–34.0)
MCHC: 31.9 g/dL (ref 30.0–36.0)
MCV: 88.7 fL (ref 80.0–100.0)
Platelets: 315 10*3/uL (ref 150–400)
RBC: 4.34 MIL/uL (ref 3.87–5.11)
RDW: 15.4 % (ref 11.5–15.5)
WBC: 12.5 10*3/uL — ABNORMAL HIGH (ref 4.0–10.5)
nRBC: 0.2 % (ref 0.0–0.2)

## 2021-12-17 LAB — BASIC METABOLIC PANEL
Anion gap: 7 (ref 5–15)
BUN: <5 mg/dL — ABNORMAL LOW (ref 8–23)
CO2: 32 mmol/L (ref 22–32)
Calcium: 8.4 mg/dL — ABNORMAL LOW (ref 8.9–10.3)
Chloride: 101 mmol/L (ref 98–111)
Creatinine, Ser: 0.82 mg/dL (ref 0.44–1.00)
GFR, Estimated: >60 mL/min (ref >60)
Glucose, Bld: 117 mg/dL — ABNORMAL HIGH (ref 70–99)
Potassium: 3.7 mmol/L (ref 3.5–5.1)
Sodium: 140 mmol/L (ref 135–145)

## 2021-12-17 LAB — MAGNESIUM: Magnesium: 1.8 mg/dL (ref 1.7–2.4)

## 2021-12-17 NOTE — Progress Notes (Signed)
°  Progress Note   Patient: Courtney [name] [id number] DOB: 12/07/1945 DOA: 12/06/2021     10 DOS: the patient was seen and examined on 12/17/2021   Brief hospital course: 76 year old female with recent COVID diarrhea 11/24/2021 readmitted with persistent recurrent diarrhea for 2 days prior to this admission.  She was found to have C. difficile colitis with hypotension AKI and hypomagnesemia.  Assessment and Plan:  #1 acute C. difficile pan colitis-patient did not improve with empiric treatment for suspected diverticulitis/colitis.   Repeat CT of the abdomen and pelvis showed progressive pancolitis.   C. difficile testing was positive and she was started on oral vancomycin and IV Flagyl. Labs are pending for today.  Her diarrhea is slowing down.  If this continues we will consider discharge tomorrow.   #2 AKI resolved with volume resuscitation.  #3 cough with dyspnea on exertion-chest x-ray with atelectasis.  Encourage patient to use incentive spirometer.  Lasix 20 mg daily for 3 days.  Continue Proventil and Pulmicort Singulair and Flonase and albuterol nebulizer.   #4 hypotension resolved with massive fluid resuscitation.  Blood pressure 150/85.  #5 hypomagnesemia/hypokalemia -Labs pending.  #6 leukocytosis improving.  #6 hypothyroidism continue Synthroid  #7 hypertension restart verapamil.    #8 depression continue Wellbutrin and Lexapro  #9 hyperlipidemia was on Lipitor 80 mg prior to admission will restart.  #10 obesity with BMI of 38.31  #11 disposition seen by PT recommending SNF.  She comes from [place].  Will discharge to [facility] snf  once medically ready.  #12 acute urinary retention Foley was taken out on 12/13/2021 and she is having urine output.  Pure wick placed yesterday 12/16/2021.  Subjective:  She is resting in bed she had a better night she slept better.   Diarrhea seems to be slowing down.  Physical Exam: Vitals:   12/17/21 0204 12/17/21 0729  12/17/21 0754 12/17/21 0800  BP: 126/67 (!) 150/85    Pulse: 86 79    Resp: 13 18    Temp:  97.6 F (36.4 C)    TempSrc:  Oral    SpO2: 92% 98% 100% 100%  Weight:      Height:       General frail elderly female in no acute distress Chest few scattered rhonchi CVS regular rate and rhythm Abdomen distended soft mild diffuse tenderness noted Extremities 1+ bilateral pitting edema  Data Reviewed: WBC 15.4 hemoglobin 11.1  BUN 5 creatinine 0.57 magnesium 1.9 potassium 3.1 Chest x-ray atelectasis  Family Communication: dw husband  Disposition: Status is: Inpatient  Remains inpatient appropriate because: C. difficile colitis with hypotension leukocytosis with ongoing diarrhea and electrolyte abnormalities    Planned Discharge Destination: Skilled nursing facility DVT prophylaxis Lovenox Time spent: 39 minutes Author: Georgette Shell, MD 12/17/2021 10:25 AM  For on call review [email or web address].

## 2021-12-17 NOTE — Progress Notes (Signed)
Mobility Specialist Progress Note    12/17/21 1235  Mobility  Activity Transferred from chair to bed  Level of Assistance Minimal assist, patient does 75% or more  Assistive Device Stedy  Activity Response Tolerated well  $Mobility charge 1 Mobility   Pt received and agreeable. No complaints. Left with call bell in reach.   [facility] Mobility Specialist  M.S. 5N: [phone]

## 2021-12-17 NOTE — Plan of Care (Addendum)
Patient has redness scattered in back. No broken skin, protective foam applied to prevent break down.  Problem: Education: Goal: Knowledge of General Education information will improve Description: Including pain rating scale, medication(s)/side effects and non-pharmacologic comfort measures Outcome: Progressing   Problem: Activity: Goal: Risk for activity intolerance will decrease Outcome: Progressing   Problem: Pain Managment: Goal: General experience of comfort will improve Outcome: Progressing   Problem: Safety: Goal: Ability to remain free from injury will improve Outcome: Progressing   Problem: Skin Integrity: Goal: Risk for impaired skin integrity will decrease Outcome: Progressing

## 2021-12-17 NOTE — Progress Notes (Signed)
Physical Therapy Treatment Patient Details Name: Courtney [name] MRN: [id number] DOB: [birth year] Today's Date: 12/17/2021   History of Present Illness Patient is 76 y.o. female recently admitted w/ COVID and symptoms of SOB and diarrhea on 11/16/21 and discharged 11/24/21. She has returned home with HHPT services. Pt returning to [facility] on 2/16 for recurrence of diarrhea 2 days prior. In the ED she was hypotensive w/ SBP in the 70s. She was also in AKI w/ hypomagnesemia. CT abdom/pelvis noted diverticulitis and colitis.  PMH significant for bipolar disorder, HTN, mitral valve prolapse, claustrophobia, CVA, vertigo, PE, prior back surgery, B knee replacements.    PT Comments    Pt received supine and agreeable to session with fair tolerance for functional mobility. Pt able to roll R/L with use of bedrails with mod I for peri-care. Mod assist to bring trunk upright from sidelying to sit with pt continuing tendency toward posterior/R lateral lean. Anterior and L lateral weight shifting performed ~3 mins for trunk engagement and facilitation of correction of posterior lean prior to standing with pt able to correct and maintain seated posture. Pt requiring very light min assist to rise with stedy from elevated EOB and no physical assist with to rise with stedy from toliet and recliner. Mod assis to rise from recliner with RW. Pt making slow steady progress towards goals and continues to be motivated. Pt continues to benefit from skilled PT services to progress toward functional mobility goals.     Recommendations for follow up therapy are one component of a multi-disciplinary discharge planning process, led by the attending physician.  Recommendations may be updated based on patient status, additional functional criteria and insurance authorization.  Follow Up Recommendations  Skilled nursing-short term rehab (<3 hours/day)     Assistance Recommended at Discharge Frequent or constant Supervision/Assistance   Patient can return home with the following Two people to help with walking and/or transfers;Two people to help with bathing/dressing/bathroom;Assistance with cooking/housework;Assistance with feeding;Help with stairs or ramp for entrance;Assist for transportation;Direct supervision/assist for medications management   Equipment Recommendations  None recommended by PT    Recommendations for Other Services OT consult     Precautions / Restrictions Precautions Precautions: Fall Precaution Comments: flexiseal Restrictions Weight Bearing Restrictions: No     Mobility  Bed Mobility Overal bed mobility: Needs Assistance Bed Mobility: Supine to Sit, Rolling Rolling: Modified independent (Device/Increase time) (use of bedrails)   Supine to sit: HOB elevated, Mod assist     General bed mobility comments: Cues for use of bed rail and to walk bil LE's off EOB. mod assist to bring trunk upright from sidelying    Transfers Overall transfer level: Needs assistance Equipment used: Ambulation equipment used, Rolling walker (2 wheels) Transfers: Sit to/from Stand Sit to Stand: Min assist, From elevated surface, Min guard, Mod assist       General transfer comment: EOB elevated, min assist to rise using stedy rail, min guard to rise from recliner with stedy, mod assist to rise from recliner with RW    Ambulation/Gait           Stairs             Wheelchair Mobility    Modified Rankin (Stroke Patients Only)       Balance Overall balance assessment: Needs assistance Sitting-balance support: Feet supported, Bilateral upper extremity supported Sitting balance-Leahy Scale: Poor Sitting balance - Comments: pt reliant on UE and close CGA to Mod assist to maintain seated balance. Postural control: Posterior  lean, Right lateral lean Standing balance support: Bilateral upper extremity supported, Reliant on assistive device for balance Standing balance-Leahy Scale: Poor                               Cognition Arousal/Alertness: Awake/alert Behavior During Therapy: WFL for tasks assessed/performed Overall Cognitive Status: Within Functional Limits for tasks assessed                                 General Comments: pt motivated to mobilize this session        Exercises General Exercises - Lower Extremity Ankle Circles/Pumps: AROM, Both, 10 reps Long Arc Quad: AROM, Both, 10 reps, Seated Other Exercises Other Exercises: staic sitting with over pressure on RLE hand and elbow to maintain center Other Exercises: BUE on PTA rocking and pushing PTA away (with PTA giving graded restistance with body back) anteriorly and laterally to L as pt has tendency to lean and push R. ~3 mins for pre sit to stand trunk engagement and to facilitate anterior weight shift for STS Other Exercises: STS x 3 from recliner (x2 with stedy, x1 with RW)    General Comments        Pertinent Vitals/Pain Pain Assessment Pain Assessment: No/denies pain    Home Living                          Prior Function            PT Goals (current goals can now be found in the care plan section) Acute Rehab PT Goals Patient Stated Goal: get stronger to get walking again PT Goal Formulation: With patient/family Time For Goal Achievement: 12/25/21 Potential to Achieve Goals: Good    Frequency    Min 3X/week      PT Plan      Co-evaluation              AM-PAC PT "6 Clicks" Mobility   Outcome Measure  Help needed turning from your back to your side while in a flat bed without using bedrails?: A Lot Help needed moving from lying on your back to sitting on the side of a flat bed without using bedrails?: Total Help needed moving to and from a bed to a chair (including a wheelchair)?: Total Help needed standing up from a chair using your arms (e.g., wheelchair or bedside chair)?: A Lot Help needed to walk in hospital room?: Total Help needed  climbing 3-5 steps with a railing? : Total 6 Click Score: 8    End of Session Equipment Utilized During Treatment: Gait belt Activity Tolerance: Patient tolerated treatment well;Patient limited by fatigue Patient left: in chair;with chair alarm set;with family/visitor present Nurse Communication: Mobility status PT Visit Diagnosis: Unsteadiness on feet (R26.81);Muscle weakness (generalized) (M62.81)     Time: [id number] PT Time Calculation (min) (ACUTE ONLY): 51 min  Charges:  $Gait Training: 23-37 mins $Therapeutic Activity: 8-22 mins                     Iverna Hammac R. PTA Acute Rehabilitation Services Office: [place] 12/17/2021, 11:01 AM

## 2021-12-17 NOTE — Progress Notes (Signed)
Pt home unit filled with water, placed at bedside and o2 connected to wall for pt to place herself on/off as needed.  RT will continue to monitor as needed.

## 2021-12-17 NOTE — Progress Notes (Signed)
Pt has had multiple PIVs placed during this stay.  IVT spoke with RN, Leotis Shames.  Plan is to d/c pt tomorrow.  PIV was placed in large vein u/s guided.

## 2021-12-18 DIAGNOSIS — Z87440 Personal history of urinary (tract) infections: Secondary | ICD-10-CM | POA: Diagnosis not present

## 2021-12-18 DIAGNOSIS — R319 Hematuria, unspecified: Secondary | ICD-10-CM | POA: Diagnosis not present

## 2021-12-18 DIAGNOSIS — M545 Low back pain, unspecified: Secondary | ICD-10-CM | POA: Diagnosis not present

## 2021-12-18 DIAGNOSIS — I2510 Atherosclerotic heart disease of native coronary artery without angina pectoris: Secondary | ICD-10-CM | POA: Diagnosis not present

## 2021-12-18 DIAGNOSIS — N179 Acute kidney failure, unspecified: Secondary | ICD-10-CM | POA: Diagnosis not present

## 2021-12-18 DIAGNOSIS — J302 Other seasonal allergic rhinitis: Secondary | ICD-10-CM | POA: Diagnosis not present

## 2021-12-18 DIAGNOSIS — Z79899 Other long term (current) drug therapy: Secondary | ICD-10-CM | POA: Diagnosis not present

## 2021-12-18 DIAGNOSIS — R635 Abnormal weight gain: Secondary | ICD-10-CM | POA: Diagnosis not present

## 2021-12-18 DIAGNOSIS — W19XXXA Unspecified fall, initial encounter: Secondary | ICD-10-CM | POA: Diagnosis not present

## 2021-12-18 DIAGNOSIS — R2689 Other abnormalities of gait and mobility: Secondary | ICD-10-CM | POA: Diagnosis not present

## 2021-12-18 DIAGNOSIS — Z20822 Contact with and (suspected) exposure to covid-19: Secondary | ICD-10-CM | POA: Diagnosis not present

## 2021-12-18 DIAGNOSIS — Z7689 Persons encountering health services in other specified circumstances: Secondary | ICD-10-CM | POA: Diagnosis not present

## 2021-12-18 DIAGNOSIS — I10 Essential (primary) hypertension: Secondary | ICD-10-CM | POA: Diagnosis not present

## 2021-12-18 DIAGNOSIS — J9811 Atelectasis: Secondary | ICD-10-CM | POA: Diagnosis not present

## 2021-12-18 DIAGNOSIS — D649 Anemia, unspecified: Secondary | ICD-10-CM | POA: Diagnosis not present

## 2021-12-18 DIAGNOSIS — R079 Chest pain, unspecified: Secondary | ICD-10-CM | POA: Diagnosis not present

## 2021-12-18 DIAGNOSIS — R102 Pelvic and perineal pain: Secondary | ICD-10-CM | POA: Diagnosis not present

## 2021-12-18 DIAGNOSIS — E785 Hyperlipidemia, unspecified: Secondary | ICD-10-CM | POA: Diagnosis not present

## 2021-12-18 DIAGNOSIS — Z743 Need for continuous supervision: Secondary | ICD-10-CM | POA: Diagnosis not present

## 2021-12-18 DIAGNOSIS — N390 Urinary tract infection, site not specified: Secondary | ICD-10-CM | POA: Diagnosis not present

## 2021-12-18 DIAGNOSIS — G8911 Acute pain due to trauma: Secondary | ICD-10-CM | POA: Diagnosis not present

## 2021-12-18 DIAGNOSIS — J309 Allergic rhinitis, unspecified: Secondary | ICD-10-CM | POA: Diagnosis not present

## 2021-12-18 DIAGNOSIS — Z7901 Long term (current) use of anticoagulants: Secondary | ICD-10-CM | POA: Diagnosis not present

## 2021-12-18 DIAGNOSIS — R109 Unspecified abdominal pain: Secondary | ICD-10-CM | POA: Diagnosis not present

## 2021-12-18 DIAGNOSIS — K5792 Diverticulitis of intestine, part unspecified, without perforation or abscess without bleeding: Secondary | ICD-10-CM | POA: Diagnosis not present

## 2021-12-18 DIAGNOSIS — Z7982 Long term (current) use of aspirin: Secondary | ICD-10-CM | POA: Diagnosis not present

## 2021-12-18 DIAGNOSIS — J45909 Unspecified asthma, uncomplicated: Secondary | ICD-10-CM | POA: Diagnosis not present

## 2021-12-18 DIAGNOSIS — R300 Dysuria: Secondary | ICD-10-CM | POA: Diagnosis not present

## 2021-12-18 DIAGNOSIS — M6281 Muscle weakness (generalized): Secondary | ICD-10-CM | POA: Diagnosis not present

## 2021-12-18 DIAGNOSIS — R0789 Other chest pain: Secondary | ICD-10-CM | POA: Diagnosis not present

## 2021-12-18 DIAGNOSIS — Z8673 Personal history of transient ischemic attack (TIA), and cerebral infarction without residual deficits: Secondary | ICD-10-CM | POA: Diagnosis not present

## 2021-12-18 DIAGNOSIS — R278 Other lack of coordination: Secondary | ICD-10-CM | POA: Diagnosis not present

## 2021-12-18 DIAGNOSIS — R531 Weakness: Secondary | ICD-10-CM | POA: Diagnosis not present

## 2021-12-18 DIAGNOSIS — E039 Hypothyroidism, unspecified: Secondary | ICD-10-CM | POA: Diagnosis not present

## 2021-12-18 DIAGNOSIS — G4733 Obstructive sleep apnea (adult) (pediatric): Secondary | ICD-10-CM | POA: Diagnosis not present

## 2021-12-18 DIAGNOSIS — F319 Bipolar disorder, unspecified: Secondary | ICD-10-CM | POA: Diagnosis not present

## 2021-12-18 LAB — CBC
HCT: 34.1 % — ABNORMAL LOW (ref 36.0–46.0)
Hemoglobin: 10.6 g/dL — ABNORMAL LOW (ref 12.0–15.0)
MCH: 28.3 pg (ref 26.0–34.0)
MCHC: 31.1 g/dL (ref 30.0–36.0)
MCV: 90.9 fL (ref 80.0–100.0)
Platelets: 342 10*3/uL (ref 150–400)
RBC: 3.75 MIL/uL — ABNORMAL LOW (ref 3.87–5.11)
RDW: 15.8 % — ABNORMAL HIGH (ref 11.5–15.5)
WBC: 8.1 10*3/uL (ref 4.0–10.5)
nRBC: 0.2 % (ref 0.0–0.2)

## 2021-12-18 LAB — BASIC METABOLIC PANEL
Anion gap: 5 (ref 5–15)
BUN: <5 mg/dL — ABNORMAL LOW (ref 8–23)
CO2: 35 mmol/L — ABNORMAL HIGH (ref 22–32)
Calcium: 8.7 mg/dL — ABNORMAL LOW (ref 8.9–10.3)
Chloride: 101 mmol/L (ref 98–111)
Creatinine, Ser: 0.77 mg/dL (ref 0.44–1.00)
GFR, Estimated: >60 mL/min (ref >60)
Glucose, Bld: 88 mg/dL (ref 70–99)
Potassium: 4 mmol/L (ref 3.5–5.1)
Sodium: 141 mmol/L (ref 135–145)

## 2021-12-18 LAB — MAGNESIUM: Magnesium: 1.8 mg/dL (ref 1.7–2.4)

## 2021-12-18 MED ORDER — METRONIDAZOLE 500 MG PO TABS: 500.0000 mg | ORAL_TABLET | Freq: Three times a day (TID) | ORAL | 0 refills | Status: AC

## 2021-12-18 MED ORDER — SIMETHICONE 80 MG PO CHEW: 80.0000 mg | CHEWABLE_TABLET | Freq: Four times a day (QID) | ORAL | 0 refills | Status: DC

## 2021-12-18 MED ORDER — SACCHAROMYCES BOULARDII 250 MG PO CAPS: 250.0000 mg | ORAL_CAPSULE | Freq: Two times a day (BID) | ORAL | Status: DC

## 2021-12-18 MED ORDER — ALBUTEROL SULFATE (2.5 MG/3ML) 0.083% IN NEBU: 2.5000 mg | INHALATION_SOLUTION | Freq: Two times a day (BID) | RESPIRATORY_TRACT | Status: DC

## 2021-12-18 MED ORDER — VANCOMYCIN HCL 125 MG PO CAPS: 125.0000 mg | ORAL_CAPSULE | Freq: Three times a day (TID) | ORAL | 0 refills | Status: AC

## 2021-12-18 MED ORDER — LORAZEPAM 1 MG PO TABS: 1.0000 mg | ORAL_TABLET | Freq: Every day | ORAL | 0 refills | Status: DC

## 2021-12-18 NOTE — TOC Transition Note (Signed)
Transition of Care [facility]) - CM/SW Discharge Note   Patient Details  Name: Courtney [name] MRN: [id number] Date of Birth: 03/02/1946  [facility] CM/SW Contact:  Lorri Frederick, LCSW Phone Number: 12/18/2021, 10:19 AM   Clinical Narrative:   Pt discharging to [facility].  RN call [phone] for report.  PTAR called [phone].     Final next level of care: Skilled Nursing Facility Barriers to Discharge: Barriers Resolved   Patient Goals and CMS Choice Patient states their goals for this hospitalization and ongoing recovery are:: being independent again CMS Medicare.gov Compare Post Acute Care list provided to::  (NA-pt is resident at [facility], wants to go there)    Discharge Placement              Patient chooses bed at:  ([facility]) Patient to be transferred to facility by: PTAR Name of family member notified: husband Fayrene Fearing Patient and family notified of of transfer: 12/18/21  Discharge Plan and Services In-house Referral: Clinical Social Work   Post Acute Care Choice: Skilled Nursing Facility                               Social Determinants of Health (SDOH) Interventions     Readmission Risk Interventions No flowsheet data found.

## 2021-12-18 NOTE — Plan of Care (Signed)
°  Problem: Education: Goal: Knowledge of disease and its progression will improve Outcome: Progressing   Problem: Activity: Goal: Activity intolerance will improve Outcome: Progressing   Problem: Pain Managment: Goal: General experience of comfort will improve Outcome: Progressing   Problem: Safety: Goal: Ability to remain free from injury will improve Outcome: Progressing   Problem: Skin Integrity: Goal: Risk for impaired skin integrity will decrease Outcome: Progressing

## 2021-12-18 NOTE — Discharge Summary (Addendum)
Physician Discharge Summary  Courtney [name] [id number] DOB: [birth year] DOA: 12/06/2021  PCP: Wenda Low, MD  Admit date: 12/06/2021 Discharge date: 12/18/2021  Admitted From: [facility] Disposition:  SNF   Recommendations for Outpatient Follow-up:  Follow up with PCP in 1-2 weeks Please obtain BMP/CBC in one week  Home Health: None Equipment/Devices none  Discharge Condition: Stable CODE STATUS: Full code Diet recommendation: Cardiac/carb modified  Brief/Interim Summary:76 year old female with recent COVID diarrhea 11/24/2021 readmitted with persistent recurrent diarrhea for 2 days prior to this admission.  She was found to have C. difficile colitis with hypotension AKI and hypomagnesemia  Discharge Diagnoses:  Principal Problem:   ARF (acute renal failure) ([place]) Active Problems:   Bipolar disorder ([place])   Hypertension   Dyslipidemia   Hypothyroidism (acquired)   OSA (obstructive sleep apnea)   Diverticulitis   Acute diverticulitis of intestine    #1 acute C. difficile pan colitis-patient did not improve with empiric treatment for suspected diverticulitis/colitis.   Repeat CT of the abdomen and pelvis showed progressive pancolitis.   C. difficile testing was positive and she was started on oral vancomycin and IV Flagyl. She was discharged on vancomycin 125 mg 4 times a day and Flagyl 500 mg 3 times a day.  Flagyl should be continued and either 2 days.  Continue vancomycin for another 7 days. She she had a very small amount of loose BM last night.  She is anxious to go to rehab and get better.  She is able to tolerate p.o. intake and her appetite is improved and she is eating well.    #2 AKI resolved with volume resuscitation.   #3 cough with dyspnea on exertion-chest x-ray with atelectasis.  Encourage patient to use incentive spirometer. Continue Proventil and Pulmicort Singulair and Flonase and albuterol nebulizer.    #4 hypotension resolved with massive fluid  resuscitation.  Blood pressure 150/85.   #5 hypomagnesemia/hypokalemia -resolved on discharge potassium 4.0 magnesium 1.8.   #6 leukocytosis resolved white count 8.1 on discharge.    #6 hypothyroidism continue Synthroid   #7 hypertension continue ACE inhibitor and verapamil.     #8 depression continue Wellbutrin and Lexapro   #9 hyperlipidemia continue Lipitor.     #10 obesity with BMI of 38.31   #11 disposition seen by PT recommending SNF.     Estimated body mass index is 38.31 kg/m as calculated from the following:   Height as of this encounter: 5\' 2"  (1.575 m).   Weight as of this encounter: 95 kg.  Discharge Instructions  Discharge Instructions     Diet - low sodium heart healthy   Complete by: As directed    Increase activity slowly   Complete by: As directed       Allergies as of 12/18/2021       Reactions   Tetanus-diphtheria Toxoids Td Anaphylaxis   Meloxicam Nausea And Vomiting   Phentermine Nausea And Vomiting   Sulfa Antibiotics Diarrhea, Nausea And Vomiting   Zithromax [azithromycin] Diarrhea, Nausea And Vomiting        Medication List     STOP taking these medications    cyclobenzaprine 10 MG tablet Commonly known as: FLEXERIL   docusate sodium 100 MG capsule Commonly known as: COLACE   magnesium oxide 400 MG tablet Commonly known as: MAG-OX   pantoprazole 40 MG tablet Commonly known as: PROTONIX   psyllium 0.52 g capsule Commonly known as: REGULOID       TAKE these medications    acetaminophen  325 MG tablet Commonly known as: TYLENOL Take 650 mg by mouth every 6 (six) hours as needed for moderate pain.   albuterol 108 (90 Base) MCG/ACT inhaler Commonly known as: VENTOLIN HFA Inhale 1 puff into the lungs every 6 (six) hours as needed for wheezing or shortness of breath.   aspirin 325 MG tablet Take 325 mg by mouth daily.   atorvastatin 80 MG tablet Commonly known as: LIPITOR Take 80 mg by mouth daily.   benzonatate 200  MG capsule Commonly known as: TESSALON Take 200 mg by mouth 3 (three) times daily as needed for cough.   Biotin 5000 MCG Caps Take 5,000 mcg by mouth daily.   buPROPion 150 MG 24 hr tablet Commonly known as: WELLBUTRIN XL Take 150 mg by mouth daily.   cyanocobalamin 1000 MCG/ML injection Commonly known as: (VITAMIN B-12) Inject 1,000 mcg into the muscle every 30 (thirty) days.   diclofenac Sodium 1 % Gel Commonly known as: VOLTAREN Apply 2 g topically at bedtime. Legs,knees,feet,wrist and shoulder   escitalopram 10 MG tablet Commonly known as: LEXAPRO Take 10 mg by mouth daily.   Flovent HFA 44 MCG/ACT inhaler Generic drug: fluticasone Inhale 2 puffs into the lungs in the morning and at bedtime.   fluticasone 50 MCG/ACT nasal spray Commonly known as: FLONASE Place 2 sprays into both nostrils in the morning and at bedtime.   gabapentin 100 MG capsule Commonly known as: NEURONTIN Take 100 mg by mouth in the morning, at noon, and at bedtime.   Gemtesa 75 MG Tabs Generic drug: Vibegron Take 75 mg by mouth at bedtime.   levothyroxine 50 MCG tablet Commonly known as: SYNTHROID Take 50 mcg by mouth daily before breakfast.   LORazepam 1 MG tablet Commonly known as: ATIVAN Take 1 tablet (1 mg total) by mouth at bedtime.   meclizine 25 MG tablet Commonly known as: ANTIVERT Take 25 mg by mouth 3 (three) times daily as needed for dizziness.   melatonin 5 MG Tabs Take 5 mg by mouth at bedtime.   metroNIDAZOLE 500 MG tablet Commonly known as: Flagyl Take 1 tablet (500 mg total) by mouth 3 (three) times daily for 2 days.   montelukast 10 MG tablet Commonly known as: SINGULAIR Take 10 mg by mouth daily.   multivitamin-lutein Caps capsule Take 1 capsule by mouth at bedtime.   NON FORMULARY CPAP at bedtime   olmesartan 40 MG tablet Commonly known as: BENICAR Take 40 mg by mouth daily.   ondansetron 4 MG tablet Commonly known as: ZOFRAN Take 4 mg by mouth every  8 (eight) hours as needed for nausea or vomiting.   Ozempic (0.25 or 0.5 MG/DOSE) 2 MG/1.5ML Sopn Generic drug: Semaglutide(0.25 or 0.5MG /DOS) Inject 0.25 mg into the skin every Tuesday.   saccharomyces boulardii 250 MG capsule Commonly known as: FLORASTOR Take 1 capsule (250 mg total) by mouth 2 (two) times daily.   simethicone 80 MG chewable tablet Commonly known as: MYLICON Chew 1 tablet (80 mg total) by mouth 4 (four) times daily.   vancomycin 125 MG capsule Commonly known as: VANCOCIN Take 1 capsule (125 mg total) by mouth 3 (three) times daily for 10 days.   verapamil 120 MG CR tablet Commonly known as: CALAN-SR Take 360 mg by mouth daily.   Vitamin D3 25 MCG (1000 UT) Caps Take 1,000 Units by mouth daily.        Follow-up Information     Wenda Low, MD Follow up.   Specialty: Internal  Medicine Contact information: [address] [place] [address] [phone]                Allergies  Allergen Reactions   Tetanus-Diphtheria Toxoids Td Anaphylaxis   Meloxicam Nausea And Vomiting   Phentermine Nausea And Vomiting   Sulfa Antibiotics Diarrhea and Nausea And Vomiting   Zithromax [Azithromycin] Diarrhea and Nausea And Vomiting    Consultations: None   Procedures/Studies: CT ABDOMEN PELVIS WO CONTRAST  Result Date: 12/09/2021 CLINICAL DATA:  76 year old female for follow-up of diverticulitis and colitis. EXAM: CT ABDOMEN AND PELVIS WITHOUT CONTRAST TECHNIQUE: Multidetector CT imaging of the abdomen and pelvis was performed following the standard protocol without IV contrast. RADIATION DOSE REDUCTION: This exam was performed according to the departmental dose-optimization program which includes automated exposure control, adjustment of the mA and/or kV according to patient size and/or use of iterative reconstruction technique. COMPARISON:  12/06/2021 and 11/18/2021 CTs FINDINGS: Please note that parenchymal and vascular abnormalities  may be missed as intravenous contrast was not administered. Lower chest: Trace pleural effusions are noted as well as mild bibasilar atelectasis. Hepatobiliary: No hepatic abnormalities are identified. The patient is status post cholecystectomy. No biliary dilatation identified. Pancreas: Atrophic without other significant abnormality Spleen: Unremarkable Adrenals/Urinary Tract: Kidneys, adrenal glands and bladder are unremarkable except for punctate nonobstructing bilateral renal calculi and a probable LEFT renal cyst. Stomach/Bowel: There is increasing diffuse circumferential wall thickening of the entire colon compatible with progressive pancolitis. Pericolonic inflammatory changes are again noted. There are mildly distended loops of proximal and mid small bowel without a transition point and there is gas throughout distal small bowel loops, likely representing an ileus. There is no evidence of pneumoperitoneum. A rectal drain is noted. Vascular/Lymphatic: Aortic atherosclerosis. No enlarged abdominal or pelvic lymph nodes. Reproductive: Status post hysterectomy. No adnexal masses. Other: A trace amount of perihepatic ascites is noted. No focal collection/definite abscess or pneumoperitoneum identified. Musculoskeletal: No acute or suspicious bony abnormalities are identified. Surgical changes in the lumbar spine again noted. IMPRESSION: 1. Increasing pancolitis. Mildly distended loops of proximal and mid small bowel without transition point, likely representing an ileus. No evidence of pneumoperitoneum or focal collection/definite abscess. 2. Trace amount of perihepatic ascites and trace pleural effusions with mild bibasilar atelectasis. 3. Aortic Atherosclerosis (ICD10-I70.0). Electronically Signed   By: Margarette Canada M.D.   On: 12/09/2021 18:54   DG Chest 1 View  Result Date: 12/12/2021 CLINICAL DATA:  Shortness of breath.  History of COVID. EXAM: CHEST  1 VIEW COMPARISON:  Chest radiograph from 11/18/2021.  FINDINGS: Monitoring leads overlie the patient. Stable cardiac and mediastinal contours. Elevation of the right hemidiaphragm. Bibasilar heterogeneous opacities favored to represent atelectasis. Thoracic spine degenerative changes. IMPRESSION: Low lung volumes with bibasilar atelectasis and elevated right hemidiaphragm. Electronically Signed   By: Lovey Newcomer M.D.   On: 12/12/2021 11:13   CT ABDOMEN PELVIS W CONTRAST  Result Date: 12/07/2021 CLINICAL DATA:  Abdominal pain, diarrhea EXAM: CT ABDOMEN AND PELVIS WITH CONTRAST TECHNIQUE: Multidetector CT imaging of the abdomen and pelvis was performed using the standard protocol following bolus administration of intravenous contrast. RADIATION DOSE REDUCTION: This exam was performed according to the departmental dose-optimization program which includes automated exposure control, adjustment of the mA and/or kV according to patient size and/or use of iterative reconstruction technique. CONTRAST:  65mL OMNIPAQUE IOHEXOL 300 MG/ML  SOLN COMPARISON:  CT abdomen/pelvis dated 11/18/2021 FINDINGS: Lower chest: Eventration of the right hemidiaphragm. Lung bases are clear. Hepatobiliary:  Liver is within normal limits. Status post cholecystectomy. No intrahepatic or extrahepatic ductal dilatation. Pancreas: Within normal limits. Spleen: Within normal limits Adrenals/Urinary Tract: Adrenal glands are within normal limits. 8 mm left upper pole renal cyst (series 3/image 28). Right kidney is within normal limits. No hydronephrosis. Bladder is within normal limits. Stomach/Bowel: Stomach is within normal limits. No evidence of bowel obstruction. Appendix is not discretely visualized. Right colonic wall thickening extending from the cecum to the hepatic flexure (series 3/image 50), suggesting infectious/inflammatory colitis. This is new from the prior. Left colonic diverticulosis with inflammatory changes along the sigmoid colon in the left pelvis (series 3/image 67), suggesting  mild sigmoid diverticulitis. This was present on the prior. No drainable fluid collection/abscess. No free air to suggest macroscopic perforation. Vascular/Lymphatic: No evidence of abdominal aortic aneurysm. Atherosclerotic calcifications of the abdominal aorta and branch vessels. No suspicious abdominopelvic lymphadenopathy. Reproductive: Status post hysterectomy. No adnexal masses. Other: Trace pelvic ascites. Musculoskeletal: Status post PLIF at L4-5. Degenerative changes of the visualized thoracolumbar spine. IMPRESSION: Mild sigmoid diverticulitis with trace pelvic ascites. No drainable fluid collection/abscess. No free air to suggest macroscopic perforation. This was present on the prior. Additional right colonic wall thickening extending from the cecum to the hepatic flexure, suggesting infectious/inflammatory colitis. This is new from the prior. Electronically Signed   By: Julian Hy M.D.   On: 12/07/2021 00:09   DG Chest Port 1 View  Result Date: 12/16/2021 CLINICAL DATA:  Shortness of breath EXAM: PORTABLE CHEST 1 VIEW COMPARISON:  Previous studies including the examination of 12/12/2021 FINDINGS: Transverse diameter of heart is increased. There is poor inspiration. Increased markings in the lower lung fields have not changed. There are no signs of pulmonary edema or new focal infiltrates. There is minimal blunting of lateral CP angles. There is surgical fusion in the cervical spine. Degenerative changes are noted in both shoulders. IMPRESSION: No significant interval changes are noted in crowding of markings in the lower lung fields since 12/12/2021. Possible minimal left pleural effusion. Electronically Signed   By: Elmer Picker M.D.   On: 12/16/2021 15:14   VAS [place] LOWER EXTREMITY VENOUS (DVT)  Result Date: 11/22/2021  Lower Venous DVT Study Patient Name:  LAWONDA LUBERTO  Date of Exam:   11/21/2021 Medical Rec #: [id number]        Accession #:    [id number] Date of Birth: 05-17-1946         Patient Gender: F Patient Age:   63 years Exam Location:  [facility] Procedure:      VAS [place] LOWER EXTREMITY VENOUS (DVT) Referring Phys: Phillips Climes --------------------------------------------------------------------------------  Indications: Elevated d-dimer, Covid-19, remote history of PE.  Comparison Study: No prior studies. Performing Technologist: Darlin Coco RDMS, RVT  Examination Guidelines: A complete evaluation includes B-mode imaging, spectral Doppler, color Doppler, and power Doppler as needed of all accessible portions of each vessel. Bilateral testing is considered an integral part of a complete examination. Limited examinations for reoccurring indications may be performed as noted. The reflux portion of the exam is performed with the patient in reverse Trendelenburg.  +---------+---------------+---------+-----------+----------+--------------+  RIGHT     Compressibility Phasicity Spontaneity Properties Thrombus Aging  +---------+---------------+---------+-----------+----------+--------------+  CFV       Full            Yes       Yes                                    +---------+---------------+---------+-----------+----------+--------------+  SFJ       Full                                                             +---------+---------------+---------+-----------+----------+--------------+  FV Prox   Full                                                             +---------+---------------+---------+-----------+----------+--------------+  FV Mid    Full                                                             +---------+---------------+---------+-----------+----------+--------------+  FV Distal Full                                                             +---------+---------------+---------+-----------+----------+--------------+  PFV       Full                                                              +---------+---------------+---------+-----------+----------+--------------+  POP       Full            Yes       Yes                                    +---------+---------------+---------+-----------+----------+--------------+  PTV       Full                                                             +---------+---------------+---------+-----------+----------+--------------+  PERO      Full                                                             +---------+---------------+---------+-----------+----------+--------------+   +---------+---------------+---------+-----------+----------+--------------+  LEFT      Compressibility Phasicity Spontaneity Properties Thrombus Aging  +---------+---------------+---------+-----------+----------+--------------+  CFV       Full            Yes       Yes                                    +---------+---------------+---------+-----------+----------+--------------+  SFJ       Full                                                             +---------+---------------+---------+-----------+----------+--------------+  FV Prox   Full                                                             +---------+---------------+---------+-----------+----------+--------------+  FV Mid    Full                                                             +---------+---------------+---------+-----------+----------+--------------+  FV Distal Full                                                             +---------+---------------+---------+-----------+----------+--------------+  PFV       Full                                                             +---------+---------------+---------+-----------+----------+--------------+  POP       Full            Yes       Yes                                    +---------+---------------+---------+-----------+----------+--------------+  PTV       Full                                                              +---------+---------------+---------+-----------+----------+--------------+  PERO      Full                                                             +---------+---------------+---------+-----------+----------+--------------+     Summary: RIGHT: - There is no evidence of deep vein thrombosis in the lower extremity.  - No cystic structure found in the popliteal fossa.  LEFT: - There is no evidence of deep vein thrombosis in the lower extremity.  - No cystic structure found in the popliteal fossa.  *See table(s)  above for measurements and observations. Electronically signed by Orlie Pollen on 11/22/2021 at 5:47:28 PM.    Final    (Echo, Carotid, EGD, Colonoscopy, ERCP)    Subjective:  She is resting in bed she feels well she is anxious to be discharged she denies any shortness of breath She had a very small 1 loose BM overnight denies any vomiting her appetite is improved and she is eating and drinking well Discharge Exam: Vitals:   12/18/21 0848 12/18/21 0935  BP: (!) 150/98   Pulse:    Resp:    Temp:  98 F (36.7 C)  SpO2:     Vitals:   12/17/21 2031 12/18/21 0513 12/18/21 0848 12/18/21 0935  BP: 136/81 (!) 155/83 (!) 150/98   Pulse: 79 71    Resp: 18 17    Temp:  98.9 F (37.2 C)  98 F (36.7 C)  TempSrc:  Oral  Oral  SpO2: 100% 100%    Weight:      Height:        General: Pt is alert, awake, not in acute distress Cardiovascular: RRR, S1/S2 +, no rubs, no gallops Respiratory: CTA bilaterally, no wheezing, no rhonchi Abdominal: Soft, NT, ND, bowel sounds + Extremities: no edema, no cyanosis    The results of significant diagnostics from this hospitalization (including imaging, microbiology, ancillary and laboratory) are listed below for reference.     Microbiology: Recent Results (from the past 240 hour(s))  C Difficile Quick Screen (NO PCR Reflex)     Status: Abnormal   Collection Time: 12/09/21  9:03 PM   Specimen: STOOL  Result Value Ref Range Status   C Diff  antigen POSITIVE (A) NEGATIVE Final   C Diff toxin POSITIVE (A) NEGATIVE Final   C Diff interpretation Toxin producing C. difficile detected.  Final    Comment: CRITICAL RESULT CALLED TO, READ BACK BY AND VERIFIED WITH: E FIERKE,RN@2211  12/09/21 [place] Performed at [facility], [address], [place], [place] [address]   MRSA Next Gen by PCR, Nasal     Status: Abnormal   Collection Time: 12/13/21 11:49 AM   Specimen: Nasal Mucosa; Nasal Swab  Result Value Ref Range Status   MRSA by PCR Next Gen DETECTED (A) NOT DETECTED Final    Comment: RESULT CALLED TO, READ BACK BY AND VERIFIED WITH: L,COLLIE RN @1335  12/13/21 EB (NOTE) The GeneXpert MRSA Assay (FDA approved for NASAL specimens only), is one component of a comprehensive MRSA colonization surveillance program. It is not intended to diagnose MRSA infection nor to guide or monitor treatment for MRSA infections. Test performance is not FDA approved in patients less than 65 years old. Performed at [facility], [place] [address], [place], [place] [address]      Labs: BNP (last 3 results) Recent Labs    11/18/21 0555  BNP 0000000   Basic Metabolic Panel: Recent Labs  Lab 12/14/21 0811 12/15/21 0041 12/16/21 0050 12/17/21 1330 12/18/21 0333  NA 136 136 136 140 141  K 3.3* 3.5 3.2* 3.7 4.0  CL 98 97* 95* 101 101  CO2 29 31 35* 32 35*  GLUCOSE 85 94 110* 117* 88  BUN <5* <5* <5* <5* <5*  CREATININE 0.59 0.70 0.66 0.82 0.77  CALCIUM 8.0* 7.8* 8.0* 8.4* 8.7*  MG 1.6* 2.0 1.6* 1.8 1.8   Liver Function Tests: Recent Labs  Lab 12/14/21 0811  AST 18  ALT 8  ALKPHOS 62  BILITOT 0.1*  PROT 4.3*  ALBUMIN 1.6*   No results for input(s): LIPASE, AMYLASE in the last 168 hours. No results for input(s): AMMONIA in the last 168 hours. CBC: Recent Labs  Lab 12/14/21 0811 12/15/21 0041 12/16/21 0050 12/17/21 1330 12/18/21 0333  WBC 11.9* 12.7* 12.0* 12.5* 8.1  HGB 10.7* 10.5* 10.5* 12.3 10.6*  HCT 32.7* 32.2*  32.3* 38.5 34.1*  MCV 87.9 88.2 88.7 88.7 90.9  PLT 380 396 374 315 342   Cardiac Enzymes: No results for input(s): CKTOTAL, CKMB, CKMBINDEX, TROPONINI in the last 168 hours. BNP: Invalid input(s): POCBNP CBG: No results for input(s): GLUCAP in the last 168 hours. D-Dimer No results for input(s): DDIMER in the last 72 hours. Hgb A1c No results for input(s): HGBA1C in the last 72 hours. Lipid Profile No results for input(s): CHOL, HDL, LDLCALC, TRIG, CHOLHDL, LDLDIRECT in the last 72 hours. Thyroid function studies No results for input(s): TSH, T4TOTAL, T3FREE, THYROIDAB in the last 72 hours.  Invalid input(s): FREET3 Anemia work up No results for input(s): VITAMINB12, FOLATE, FERRITIN, TIBC, IRON, RETICCTPCT in the last 72 hours. Urinalysis    Component Value Date/Time   COLORURINE YELLOW 12/07/2021 0110   APPEARANCEUR CLEAR 12/07/2021 0110   LABSPEC 1.020 12/07/2021 0110   PHURINE 5.5 12/07/2021 0110   GLUCOSEU NEGATIVE 12/07/2021 0110   HGBUR NEGATIVE 12/07/2021 0110   BILIRUBINUR NEGATIVE 12/07/2021 0110   KETONESUR NEGATIVE 12/07/2021 0110   PROTEINUR NEGATIVE 12/07/2021 0110   NITRITE NEGATIVE 12/07/2021 0110   LEUKOCYTESUR NEGATIVE 12/07/2021 0110   Sepsis Labs Invalid input(s): PROCALCITONIN,  WBC,  LACTICIDVEN Microbiology Recent Results (from the past 240 hour(s))  C Difficile Quick Screen (NO PCR Reflex)     Status: Abnormal   Collection Time: 12/09/21  9:03 PM   Specimen: STOOL  Result Value Ref Range Status   C Diff antigen POSITIVE (A) NEGATIVE Final   C Diff toxin POSITIVE (A) NEGATIVE Final   C Diff interpretation Toxin producing C. difficile detected.  Final    Comment: CRITICAL RESULT CALLED TO, READ BACK BY AND VERIFIED WITH: E FIERKE,RN@2211  12/09/21 [place] Performed at [facility], [address] [place], [place] [address]   MRSA Next Gen by PCR, Nasal     Status: Abnormal   Collection Time: 12/13/21 11:49 AM   Specimen: Nasal Mucosa;  Nasal Swab  Result Value Ref Range Status   MRSA by PCR Next Gen DETECTED (A) NOT DETECTED Final    Comment: RESULT CALLED TO, READ BACK BY AND VERIFIED WITH: L,COLLIE RN @1335  12/13/21 EB (NOTE) The GeneXpert MRSA Assay (FDA approved for NASAL specimens only), is one component of a comprehensive MRSA colonization surveillance program. It is not intended to diagnose MRSA infection nor to guide or monitor treatment for MRSA infections. Test performance is not FDA approved in patients less than 3 years old. Performed at [facility], [place] [address], [place], [place] [address]      Time coordinating discharge: 39 minutes  SIGNED:   Georgette Shell, MD  [facility] 12/18/2021, 9:55 AM

## 2021-12-21 ENCOUNTER — Ambulatory Visit: Payer: Medicare HMO | Admitting: Internal Medicine

## 2021-12-24 ENCOUNTER — Ambulatory Visit: Payer: Medicare HMO | Admitting: Interventional Cardiology

## 2021-12-25 DIAGNOSIS — W19XXXA Unspecified fall, initial encounter: Secondary | ICD-10-CM | POA: Diagnosis not present

## 2021-12-25 DIAGNOSIS — R319 Hematuria, unspecified: Secondary | ICD-10-CM | POA: Diagnosis not present

## 2021-12-25 DIAGNOSIS — R300 Dysuria: Secondary | ICD-10-CM | POA: Diagnosis not present

## 2021-12-25 DIAGNOSIS — Z87440 Personal history of urinary (tract) infections: Secondary | ICD-10-CM | POA: Diagnosis not present

## 2021-12-25 DIAGNOSIS — M545 Low back pain, unspecified: Secondary | ICD-10-CM | POA: Diagnosis not present

## 2021-12-26 DIAGNOSIS — Z7689 Persons encountering health services in other specified circumstances: Secondary | ICD-10-CM | POA: Diagnosis not present

## 2021-12-26 DIAGNOSIS — M545 Low back pain, unspecified: Secondary | ICD-10-CM | POA: Diagnosis not present

## 2021-12-27 DIAGNOSIS — D649 Anemia, unspecified: Secondary | ICD-10-CM | POA: Diagnosis not present

## 2021-12-27 DIAGNOSIS — Z7689 Persons encountering health services in other specified circumstances: Secondary | ICD-10-CM | POA: Diagnosis not present

## 2021-12-27 DIAGNOSIS — N390 Urinary tract infection, site not specified: Secondary | ICD-10-CM | POA: Diagnosis not present

## 2021-12-28 DIAGNOSIS — N390 Urinary tract infection, site not specified: Secondary | ICD-10-CM | POA: Diagnosis not present

## 2021-12-28 DIAGNOSIS — J302 Other seasonal allergic rhinitis: Secondary | ICD-10-CM | POA: Diagnosis not present

## 2021-12-28 DIAGNOSIS — Z7689 Persons encountering health services in other specified circumstances: Secondary | ICD-10-CM | POA: Diagnosis not present

## 2022-01-02 ENCOUNTER — Observation Stay (HOSPITAL_COMMUNITY)
Admission: EM | Admit: 2022-01-02 | Discharge: 2022-01-04 | Disposition: A | Discharge status: Skilled Nursing Facility | Payer: Medicare HMO | Attending: Emergency Medicine | Admitting: Emergency Medicine

## 2022-01-02 ENCOUNTER — Encounter (HOSPITAL_COMMUNITY): Payer: Self-pay

## 2022-01-02 ENCOUNTER — Emergency Department (HOSPITAL_BASED_OUTPATIENT_CLINIC_OR_DEPARTMENT_OTHER): Payer: Medicare HMO

## 2022-01-02 ENCOUNTER — Emergency Department (HOSPITAL_COMMUNITY): Payer: Medicare HMO

## 2022-01-02 ENCOUNTER — Other Ambulatory Visit: Payer: Self-pay

## 2022-01-02 DIAGNOSIS — F319 Bipolar disorder, unspecified: Secondary | ICD-10-CM | POA: Diagnosis present

## 2022-01-02 DIAGNOSIS — J45909 Unspecified asthma, uncomplicated: Secondary | ICD-10-CM | POA: Insufficient documentation

## 2022-01-02 DIAGNOSIS — F331 Major depressive disorder, recurrent, moderate: Secondary | ICD-10-CM | POA: Diagnosis present

## 2022-01-02 DIAGNOSIS — R079 Chest pain, unspecified: Secondary | ICD-10-CM | POA: Diagnosis not present

## 2022-01-02 DIAGNOSIS — Z8673 Personal history of transient ischemic attack (TIA), and cerebral infarction without residual deficits: Secondary | ICD-10-CM | POA: Diagnosis not present

## 2022-01-02 DIAGNOSIS — R399 Unspecified symptoms and signs involving the genitourinary system: Secondary | ICD-10-CM

## 2022-01-02 DIAGNOSIS — I10 Essential (primary) hypertension: Secondary | ICD-10-CM | POA: Diagnosis present

## 2022-01-02 DIAGNOSIS — R0789 Other chest pain: Secondary | ICD-10-CM | POA: Diagnosis not present

## 2022-01-02 DIAGNOSIS — I2510 Atherosclerotic heart disease of native coronary artery without angina pectoris: Secondary | ICD-10-CM | POA: Diagnosis not present

## 2022-01-02 DIAGNOSIS — E039 Hypothyroidism, unspecified: Secondary | ICD-10-CM | POA: Diagnosis not present

## 2022-01-02 DIAGNOSIS — E785 Hyperlipidemia, unspecified: Secondary | ICD-10-CM | POA: Diagnosis present

## 2022-01-02 DIAGNOSIS — Z20822 Contact with and (suspected) exposure to covid-19: Secondary | ICD-10-CM | POA: Insufficient documentation

## 2022-01-02 DIAGNOSIS — G4733 Obstructive sleep apnea (adult) (pediatric): Secondary | ICD-10-CM | POA: Diagnosis not present

## 2022-01-02 DIAGNOSIS — F419 Anxiety disorder, unspecified: Secondary | ICD-10-CM | POA: Insufficient documentation

## 2022-01-02 DIAGNOSIS — Z7982 Long term (current) use of aspirin: Secondary | ICD-10-CM | POA: Diagnosis not present

## 2022-01-02 DIAGNOSIS — Z79899 Other long term (current) drug therapy: Secondary | ICD-10-CM | POA: Insufficient documentation

## 2022-01-02 DIAGNOSIS — J9811 Atelectasis: Secondary | ICD-10-CM | POA: Diagnosis not present

## 2022-01-02 DIAGNOSIS — Z7901 Long term (current) use of anticoagulants: Secondary | ICD-10-CM | POA: Diagnosis not present

## 2022-01-02 HISTORY — DX: Atherosclerosis of aorta: I70.0

## 2022-01-02 HISTORY — DX: Phlebitis and thrombophlebitis of unspecified site: I80.9

## 2022-01-02 HISTORY — DX: Atherosclerotic heart disease of native coronary artery without angina pectoris: I25.10

## 2022-01-02 LAB — BASIC METABOLIC PANEL
Anion gap: 11 (ref 5–15)
BUN: 5 mg/dL — ABNORMAL LOW (ref 8–23)
CO2: 24 mmol/L (ref 22–32)
Calcium: 9 mg/dL (ref 8.9–10.3)
Chloride: 103 mmol/L (ref 98–111)
Creatinine, Ser: 0.59 mg/dL (ref 0.44–1.00)
GFR, Estimated: >60 mL/min (ref >60)
Glucose, Bld: 91 mg/dL (ref 70–99)
Potassium: 3.7 mmol/L (ref 3.5–5.1)
Sodium: 138 mmol/L (ref 135–145)

## 2022-01-02 LAB — URINALYSIS, ROUTINE W REFLEX MICROSCOPIC
Bilirubin Urine: NEGATIVE
Glucose, UA: NEGATIVE mg/dL
Ketones, ur: NEGATIVE mg/dL
Nitrite: NEGATIVE
Protein, ur: 30 mg/dL — AB
Specific Gravity, Urine: 1.011 (ref 1.005–1.030)
WBC, UA: >50 WBC/hpf — ABNORMAL HIGH (ref 0–5)
pH: 6 (ref 5.0–8.0)

## 2022-01-02 LAB — ECHOCARDIOGRAM COMPLETE
Area-P 1/2: 3.72 cm2
Height: 62 in
S' Lateral: 3.4 cm
Weight: 3040 oz

## 2022-01-02 LAB — CBC
HCT: 35.8 % — ABNORMAL LOW (ref 36.0–46.0)
Hemoglobin: 11 g/dL — ABNORMAL LOW (ref 12.0–15.0)
MCH: 28.4 pg (ref 26.0–34.0)
MCHC: 30.7 g/dL (ref 30.0–36.0)
MCV: 92.5 fL (ref 80.0–100.0)
Platelets: 339 10*3/uL (ref 150–400)
RBC: 3.87 MIL/uL (ref 3.87–5.11)
RDW: 15.1 % (ref 11.5–15.5)
WBC: 9.2 10*3/uL (ref 4.0–10.5)
nRBC: 0 % (ref 0.0–0.2)

## 2022-01-02 LAB — TROPONIN I (HIGH SENSITIVITY)
Troponin I (High Sensitivity): 7 ng/L (ref <18)
Troponin I (High Sensitivity): 7 ng/L (ref <18)

## 2022-01-02 MED ORDER — BUPROPION HCL ER (XL) 150 MG PO TB24
150.0000 mg | ORAL_TABLET | Freq: Every day | ORAL | Status: DC
  Administered 2022-01-03 – 2022-01-04 (×2): 150 mg via ORAL
  Filled 2022-01-02 (×2): qty 1

## 2022-01-02 MED ORDER — SODIUM CHLORIDE 0.9% FLUSH: 3.0000 mL | INTRAVENOUS | Status: DC | PRN

## 2022-01-02 MED ORDER — ONDANSETRON HCL 4 MG/2ML IJ SOLN
4.0000 mg | Freq: Once | INTRAMUSCULAR | Status: AC
  Administered 2022-01-02: 4 mg via INTRAVENOUS
  Filled 2022-01-02: qty 2

## 2022-01-02 MED ORDER — BUDESONIDE 0.25 MG/2ML IN SUSP
0.2500 mg | Freq: Two times a day (BID) | RESPIRATORY_TRACT | Status: DC
  Administered 2022-01-03 (×2): 0.25 mg via RESPIRATORY_TRACT
  Filled 2022-01-02 (×4): qty 2

## 2022-01-02 MED ORDER — SODIUM CHLORIDE 0.9 % IV SOLN: 250.0000 mL | INTRAVENOUS | Status: DC | PRN

## 2022-01-02 MED ORDER — LEVOTHYROXINE SODIUM 25 MCG PO TABS
50.0000 ug | ORAL_TABLET | Freq: Every day | ORAL | Status: DC
  Administered 2022-01-03 – 2022-01-04 (×2): 50 ug via ORAL
  Filled 2022-01-02 (×2): qty 2

## 2022-01-02 MED ORDER — MORPHINE SULFATE (PF) 4 MG/ML IV SOLN
4.0000 mg | Freq: Once | INTRAVENOUS | Status: AC
  Administered 2022-01-02: 4 mg via INTRAVENOUS
  Filled 2022-01-02: qty 1

## 2022-01-02 MED ORDER — LORAZEPAM 1 MG PO TABS
1.0000 mg | ORAL_TABLET | Freq: Every day | ORAL | Status: DC
  Administered 2022-01-02 – 2022-01-03 (×2): 1 mg via ORAL
  Filled 2022-01-02 (×2): qty 1

## 2022-01-02 MED ORDER — ASPIRIN 325 MG PO TABS
325.0000 mg | ORAL_TABLET | Freq: Every day | ORAL | Status: DC
  Administered 2022-01-03 – 2022-01-04 (×2): 325 mg via ORAL
  Filled 2022-01-02 (×2): qty 1

## 2022-01-02 MED ORDER — ATORVASTATIN CALCIUM 40 MG PO TABS
80.0000 mg | ORAL_TABLET | Freq: Every day | ORAL | Status: DC
  Administered 2022-01-03: 80 mg via ORAL
  Filled 2022-01-02: qty 2

## 2022-01-02 MED ORDER — VIBEGRON 75 MG PO TABS: 75.0000 mg | ORAL_TABLET | Freq: Every day | ORAL | Status: DC

## 2022-01-02 MED ORDER — ACETAMINOPHEN 325 MG PO TABS
650.0000 mg | ORAL_TABLET | Freq: Four times a day (QID) | ORAL | Status: DC | PRN
  Administered 2022-01-03 – 2022-01-04 (×2): 650 mg via ORAL
  Filled 2022-01-02 (×2): qty 2

## 2022-01-02 MED ORDER — MELATONIN 5 MG PO TABS
5.0000 mg | ORAL_TABLET | Freq: Every day | ORAL | Status: DC
  Administered 2022-01-02 – 2022-01-03 (×2): 5 mg via ORAL
  Filled 2022-01-02 (×2): qty 1

## 2022-01-02 MED ORDER — ACETAMINOPHEN 650 MG RE SUPP: 650.0000 mg | Freq: Four times a day (QID) | RECTAL | Status: DC | PRN

## 2022-01-02 MED ORDER — LORATADINE 10 MG PO TABS
10.0000 mg | ORAL_TABLET | Freq: Every day | ORAL | Status: DC
  Administered 2022-01-03 – 2022-01-04 (×2): 10 mg via ORAL
  Filled 2022-01-02 (×2): qty 1

## 2022-01-02 MED ORDER — ONDANSETRON HCL 4 MG/2ML IJ SOLN: 4.0000 mg | Freq: Three times a day (TID) | INTRAMUSCULAR | Status: DC | PRN

## 2022-01-02 MED ORDER — SODIUM CHLORIDE 0.9% FLUSH
3.0000 mL | Freq: Two times a day (BID) | INTRAVENOUS | Status: DC
  Administered 2022-01-02 – 2022-01-03 (×2): 3 mL via INTRAVENOUS

## 2022-01-02 MED ORDER — MORPHINE SULFATE (PF) 2 MG/ML IV SOLN: 1.0000 mg | INTRAVENOUS | Status: DC | PRN

## 2022-01-02 MED ORDER — ENOXAPARIN SODIUM 40 MG/0.4ML IJ SOSY
40.0000 mg | PREFILLED_SYRINGE | INTRAMUSCULAR | Status: DC
  Administered 2022-01-02 – 2022-01-03 (×2): 40 mg via SUBCUTANEOUS
  Filled 2022-01-02 (×2): qty 0.4

## 2022-01-02 MED ORDER — MONTELUKAST SODIUM 10 MG PO TABS
10.0000 mg | ORAL_TABLET | Freq: Every day | ORAL | Status: DC
  Administered 2022-01-03 – 2022-01-04 (×2): 10 mg via ORAL
  Filled 2022-01-02 (×2): qty 1

## 2022-01-02 MED ORDER — MECLIZINE HCL 25 MG PO TABS: 25.0000 mg | ORAL_TABLET | Freq: Three times a day (TID) | ORAL | Status: DC | PRN

## 2022-01-02 MED ORDER — ESCITALOPRAM OXALATE 10 MG PO TABS
10.0000 mg | ORAL_TABLET | Freq: Every day | ORAL | Status: DC
  Administered 2022-01-03 – 2022-01-04 (×2): 10 mg via ORAL
  Filled 2022-01-02 (×2): qty 1

## 2022-01-02 MED ORDER — ALBUTEROL SULFATE HFA 108 (90 BASE) MCG/ACT IN AERS
1.0000 | INHALATION_SPRAY | Freq: Four times a day (QID) | RESPIRATORY_TRACT | Status: DC | PRN
  Filled 2022-01-02: qty 6.7

## 2022-01-02 MED ORDER — FLUTICASONE PROPIONATE HFA 44 MCG/ACT IN AERO: 2.0000 | INHALATION_SPRAY | Freq: Two times a day (BID) | RESPIRATORY_TRACT | Status: DC

## 2022-01-02 MED ORDER — GABAPENTIN 100 MG PO CAPS
100.0000 mg | ORAL_CAPSULE | Freq: Three times a day (TID) | ORAL | Status: DC
  Administered 2022-01-02 – 2022-01-04 (×5): 100 mg via ORAL
  Filled 2022-01-02 (×5): qty 1

## 2022-01-02 NOTE — Assessment & Plan Note (Addendum)
Persistently positive UA, culture pending. To abbreviate antibiotics as much as possible (recent hx C. diff colitis), gave fosfomycin x1.  ?

## 2022-01-02 NOTE — Assessment & Plan Note (Signed)
Continue wellbutrin, lexapro and ativan  ?

## 2022-01-02 NOTE — ED Notes (Signed)
The pt will be npo after midnight for a procedure arounf 0900 in the am ?

## 2022-01-02 NOTE — ED Provider Notes (Signed)
?[facility] EM[facility]ENC[facility] ?Provider Note ? ? ?CSN: [id number] ?Arrival date & time: 01/02/22  1015 ? ?  ? ?History ? ?Chief Complaint  ?Patient presents with  ? Chest Pain  ? ? ?Courtney [name] is a 76 y.o. female. ? ?Courtney [name] is a 76 year old female with a pertinent history of HTN, CAD, HLD, OSA on CPAP, and recent hospitalization for C. diff discharged on 12/18/2021 who presents to the ED from SNF for complaints of chest pain. Pain started suddenly this morning around 8AM as a constant and tight left chest pressure that radiates to her left upper extremity, causing muscle spasms which have now resolved. Patient initially thought she was experiencing a panic attack and notified her facility nurse. No known factors that aggravate or alleviate chest pressure. Patient has not tried any medication for her symptoms besides ASA given by EMS. She denies history of MI, precipitating trauma or fall, or experiencing chest pain like this before. She endorses headache, nausea, dysuria, and urinary incontinence but denies shortness of breath, diaphoresis, palpitations, fever, chills, cough, hematemesis, abdominal pain, vomiting, diarrhea, or constipation.  ? ? ?  ? ?Home Medications ?Prior to Admission medications   ?Medication Sig Start Date End Date Taking? Authorizing Provider  ?acetaminophen (TYLENOL) 325 MG tablet Take 650 mg by mouth every 6 (six) hours as needed for moderate pain.    [provider]  ?albuterol (VENTOLIN HFA) 108 (90 Base) MCG/ACT inhaler Inhale 1 puff into the lungs every 6 (six) hours as needed for wheezing or shortness of breath. 02/22/21   [provider]  ?aspirin 325 MG tablet Take 325 mg by mouth daily.    [provider]  ?atorvastatin (LIPITOR) 80 MG tablet Take 80 mg by mouth daily. 02/21/21   [provider]  ?benzonatate (TESSALON) 200 MG capsule Take 200 mg by mouth 3 (three) times daily as needed for cough. 12/06/21   [provider]  ?Biotin 5000 MCG CAPS Take 5,000 mcg by mouth daily.    [provider]  ?buPROPion (WELLBUTRIN XL) 150 MG 24 hr tablet Take 150 mg by mouth daily. 05/27/16   [provider]  ?Cholecalciferol (VITAMIN D3) 25 MCG (1000 UT) CAPS Take 1,000 Units by mouth daily.    [provider]  ?cyanocobalamin (,VITAMIN B-12,) 1000 MCG/ML injection Inject 1,000 mcg into the muscle every 30 (thirty) days. 02/23/21   [provider]  ?diclofenac Sodium (VOLTAREN) 1 % GEL Apply 2 g topically at bedtime. Legs,knees,feet,wrist and shoulder 12/02/20   [provider]  ?escitalopram (LEXAPRO) 10 MG tablet Take 10 mg by mouth daily. 11/24/20   [provider]  ?FLOVENT HFA 44 MCG/ACT inhaler Inhale 2 puffs into the lungs in the morning and at bedtime. 02/22/21   [provider]  ?fluticasone (FLONASE) 50 MCG/ACT nasal spray Place 2 sprays into both nostrils in the morning and at bedtime. 10/29/21   [provider]  ?gabapentin (NEURONTIN) 100 MG capsule Take 100 mg by mouth in the morning, at noon, and at bedtime. 02/13/21   [provider]  ?levothyroxine (SYNTHROID) 50 MCG tablet Take 50 mcg by mouth daily before breakfast. 02/09/21   [provider]  ?LORazepam (ATIVAN) 1 MG tablet Take 1 tablet (1 mg total) by mouth at bedtime. 12/18/21   Alwyn RenMathews, Elizabeth G, MD  ?meclizine (ANTIVERT) 25 MG tablet Take 25 mg by mouth 3 (three) times daily as needed for dizziness.    [provider]  ?  melatonin 5 MG TABS Take 5 mg by mouth at bedtime.    [provider]  ?montelukast (SINGULAIR) 10 MG tablet Take 10 mg by mouth daily. 02/13/21   [provider]  ?multivitamin-lutein (OCUVITE-LUTEIN) CAPS capsule Take 1 capsule by mouth at bedtime.    [provider]  ?NON FORMULARY CPAP at bedtime    [provider]  ?olmesartan (BENICAR) 40 MG tablet Take 40 mg by mouth daily. 02/21/21   [provider]   ?ondansetron (ZOFRAN) 4 MG tablet Take 4 mg by mouth every 8 (eight) hours as needed for nausea or vomiting.    [provider]  ?OZEMPIC, 0.25 OR 0.5 MG/DOSE, 2 MG/1.5ML SOPN Inject 0.25 mg into the skin every Tuesday. 02/27/21   [provider]  ?saccharomyces boulardii (FLORASTOR) 250 MG capsule Take 1 capsule (250 mg total) by mouth 2 (two) times daily. 12/18/21   Alwyn Ren, MD  ?simethicone (MYLICON) 80 MG chewable tablet Chew 1 tablet (80 mg total) by mouth 4 (four) times daily. 12/18/21   Alwyn Ren, MD  ?verapamil (CALAN-SR) 120 MG CR tablet Take 360 mg by mouth daily. 02/13/21   [provider]  ?Vibegron (GEMTESA) 75 MG TABS Take 75 mg by mouth at bedtime.    [provider]  ?   ? ?Allergies    ?Tetanus-diphtheria toxoids td, Meloxicam, Phentermine, Sulfa antibiotics, and Zithromax [azithromycin]   ? ?Review of Systems   ?Review of Systems ?Negative except as per HPI ?Physical Exam ?Updated Vital Signs ?BP 121/65   Pulse 77   Temp 98.5 ?F (36.9 ?C) (Oral)   Resp 15   Ht 5\' 2"  (1.575 m)   Wt 86.2 kg   SpO2 91%   BMI 34.75 kg/m?  ?Physical Exam ?Vitals and nursing note reviewed.  ?Constitutional:   ?   Appearance: She is well-developed. She is obese.  ?HENT:  ?   Head: Normocephalic and atraumatic.  ?Cardiovascular:  ?   Rate and Rhythm: Normal rate and regular rhythm.  ?   Heart sounds: Normal heart sounds. No murmur heard. ?Pulmonary:  ?   Effort: Pulmonary effort is normal.  ?   Breath sounds: Normal breath sounds.  ?Chest:  ?   Chest wall: Tenderness present.  ?Abdominal:  ?   Palpations: Abdomen is soft.  ?   Tenderness: There is no abdominal tenderness.  ?Musculoskeletal:  ?   Cervical back: Neck supple.  ?   Right lower leg: No tenderness. No edema.  ?   Left lower leg: No tenderness. No edema.  ?Skin: ?   General: Skin is warm and dry.  ?   Findings: No erythema or rash.  ?Neurological:  ?   Mental Status: She is alert.  ? ? ?ED Results /  Procedures / Treatments   ?Labs ?(all labs ordered are listed, but only abnormal results are displayed) ?Labs Reviewed  ?BASIC METABOLIC PANEL - Abnormal; Notable for the following components:  ?    Result Value  ? BUN 5 (*)   ? All other components within normal limits  ?CBC - Abnormal; Notable for the following components:  ? Hemoglobin 11.0 (*)   ? HCT 35.8 (*)   ? All other components within normal limits  ?TROPONIN I (HIGH SENSITIVITY)  ?TROPONIN I (HIGH SENSITIVITY)  ? ? ?EKG ?EKG Interpretation ? ?Date/Time:  Wednesday January 02 2022 10:23:54 EDT ?Ventricular Rate:  72 ?PR Interval:  166 ?QRS Duration: 88 ?QT Interval:  403 ?  QTC Calculation: 441 ?R Axis:   55 ?Text Interpretation: Sinus rhythm Confirmed by Ernie Avena ([id number]) on 01/02/2022 12:17:25 PM ? ?Radiology ?DG Chest Port 1 View ? ?Result Date: 01/02/2022 ?CLINICAL DATA:  Chest pain, LEFT chest pain under breast EXAM: PORTABLE CHEST 1 VIEW COMPARISON:  Portable exam 1045 hours compared to 12/16/2021 FINDINGS: Normal heart size, mediastinal contours, and pulmonary vascularity. Chronic elevation of RIGHT diaphragm with RIGHT basilar atelectasis. Chronic bronchitic changes. No pulmonary infiltrate, pleural effusion, or pneumothorax. Bones demineralized with BILATERAL glenohumeral degenerative changes and prior cervical fusion. IMPRESSION: Bronchitic changes with chronic RIGHT basilar atelectasis. No acute abnormalities. Electronically Signed   By: Ulyses Southward M.D.   On: 01/02/2022 10:51   ? ?Procedures ?Procedures  ? ? ?Medications Ordered in ED ?Medications  ?ondansetron (ZOFRAN) injection 4 mg (4 mg Intravenous Given 01/02/22 1206)  ?morphine (PF) 4 MG/ML injection 4 mg (4 mg Intravenous Given 01/02/22 1205)  ? ? ?ED Course/ Medical Decision Making/ A&P ?Clinical Course as of 01/02/22 1500  ?Wed Jan 02, 2022  ?1314 Troponin I (High Sensitivity) [JN]  ?1314 Troponin I (High Sensitivity): 7 [JN]  ?1314 Troponin I (High Sensitivity) [JN]  ?  ?Clinical Course  User Index ?[JN] Marcelline Deist, Student-PA  ? ?                        ?Medical Decision Making ?Amount and/or Complexity of Data Reviewed ?Labs: ordered. ?Radiology: ordered. ? ?Risk ?Prescription drug management. ? ?
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
## 2022-01-02 NOTE — Assessment & Plan Note (Signed)
TSH wnl in 02/2021 ?Continue home synthroid  ?

## 2022-01-02 NOTE — ED Notes (Signed)
Pt in a procedure ?

## 2022-01-02 NOTE — ED Triage Notes (Signed)
[facility] reports pt coming from [facility] for rehab for left side chest pain. Also left  arm numbness. Describes chest pain as pressure. 324mg   ASA given by EMS. ?

## 2022-01-02 NOTE — Assessment & Plan Note (Addendum)
76 year old female presenting with atypical chest pain, suggestive of MSK etiology.  ?- Tylenol prn. Consider alternative work up/Tx if persistent. ?

## 2022-01-02 NOTE — Assessment & Plan Note (Signed)
Continue ASA/statin. LDL to goal 02/2021 ?

## 2022-01-02 NOTE — H&P (Signed)
?History and Physical  ? ? ?Patient: Courtney [name] KXF:[id number] DOB: 1946/08/23 ?DOA: 01/02/2022 ?DOS: the patient was seen and examined on 01/02/2022 ?PCP: Georgann Housekeeper, MD  ?Patient coming from: SNF -[facility]  ? ? ?Chief Complaint: chest pain  ? ?HPI: Courtney [name] is a 76 y.o. female with medical history significant of   hypothyroidism, sleep apnea, hyperlipidemia, hypertension, 3V coronary calcification by Ct in 10/2021, depression/anxiety/bipolar, hx of CVA who presented to ED with complaints of chest pain. Also recently admitted on 2/16 for c.diff colitis with hypotension/AKI. She complains of chest pain that started this AM after eating breakfast. While walking to go to the bathroom she got pain in her left breast area. She states the pain felt like pressure and rated as a 7-8/10. She had associated spasms down her left arm, no radiation up jaw.she had associated nausea, no diaphoresis.  Pain  has been constant, she still has this, but it's better with the morphine and zofran.  ? ?She has no family hx of heart disease. She does not smoke or drink. No hx of diabetes. She does take cholesterol medication.  ? ?No fever/chills, no headaches or vision changes, no palpitations, no shortness of breath, mild cough. No stomach pain, no recent N/V/D, no dysuria, but has pressure still. No leg swelling.  ? ?Currently on medication for UTI. Diagnosed 5 days ago. Cant recall name  ? ? ?ER Course:  vitals: afebrile, bp: 154/81, HR: 76, RR: 18, oxygen: 98%RA ?Pertinent labs: hgb 11, troponin wnl x 2,  ?CXR: no acute findings ?In ED: cardiology consulted.  ? ? ? ?Review of Systems: As mentioned in the history of present illness. All other systems reviewed and are negative. ?Past Medical History:  ?Diagnosis Date  ? Anemia   ? Aortic atherosclerosis (HCC)   ? Arthritis   ? Bilateral Wrists  ? Asthma   ? Bipolar disorder (HCC)   ? Coronary artery calcification   ? Diverticulitis   ? Hiatal hernia   ? History of  kidney stones 2006  ? Hypertension   ? Hypothyroidism (acquired) 04/02/2021  ? Kidney stone   ? Mitral valve prolapse   ? OSA (obstructive sleep apnea) 04/02/2021  ? Panic attack   ? PONV (postoperative nausea and vomiting)   ? Pulmonary embolus (HCC)   ? Stroke [facility])   ? Thrombophlebitis   ? Vertigo   ? ?Past Surgical History:  ?Procedure Laterality Date  ? ABDOMINAL HYSTERECTOMY    ? ANTERIOR CERVICAL DECOMP/DISCECTOMY FUSION N/A 03/28/2021  ? Procedure: Cervical three-four Cervical four-five Cervical five-six Anterior cervical decompression/discectomy/fusion/interbody prosthesis/plate/screws;  Surgeon: Tressie Stalker, MD;  Location: [facility] OR;  Service: Neurosurgery;  Laterality: N/A;  ? BACK SURGERY    ? BREAST REDUCTION SURGERY    ? CHOLECYSTECTOMY    ? EYE SURGERY Bilateral 2000  ? Cataract  ? JOINT REPLACEMENT Bilateral   ? Knee replacement  ? KNEE SURGERY    ? KNEE SURGERY    ? TOE SURGERY    ? TONSILLECTOMY    ? ?Social History:  reports that she has never smoked. She has never used smokeless tobacco. She reports current alcohol use. She reports that she does not currently use drugs. ? ?Allergies  ?Allergen Reactions  ? Tetanus-Diphtheria Toxoids Td Anaphylaxis  ? Meloxicam Nausea And Vomiting  ? Phentermine Nausea And Vomiting  ? Sulfa Antibiotics Diarrhea and Nausea And Vomiting  ? Zithromax [Azithromycin] Diarrhea and Nausea And Vomiting  ? ? ?Family History  ?  Problem Relation Age of Onset  ? Stroke Father   ? Hyperlipidemia Father   ? CAD Neg Hx   ? ? ?Prior to Admission medications   ?Medication Sig Start Date End Date Taking? Authorizing Provider  ?acetaminophen (TYLENOL) 325 MG tablet Take 650 mg by mouth every 6 (six) hours as needed for moderate pain.    [provider]  ?albuterol (VENTOLIN HFA) 108 (90 Base) MCG/ACT inhaler Inhale 1 puff into the lungs every 6 (six) hours as needed for wheezing or shortness of breath. 02/22/21   [provider]  ?aspirin 325 MG tablet Take 325 mg by  mouth daily.    [provider]  ?atorvastatin (LIPITOR) 80 MG tablet Take 80 mg by mouth daily. 02/21/21   [provider]  ?benzonatate (TESSALON) 200 MG capsule Take 200 mg by mouth 3 (three) times daily as needed for cough. 12/06/21   [provider]  ?Biotin 5000 MCG CAPS Take 5,000 mcg by mouth daily.    [provider]  ?buPROPion (WELLBUTRIN XL) 150 MG 24 hr tablet Take 150 mg by mouth daily. 05/27/16   [provider]  ?Cholecalciferol (VITAMIN D3) 25 MCG (1000 UT) CAPS Take 1,000 Units by mouth daily.    [provider]  ?cyanocobalamin (,VITAMIN B-12,) 1000 MCG/ML injection Inject 1,000 mcg into the muscle every 30 (thirty) days. 02/23/21   [provider]  ?diclofenac Sodium (VOLTAREN) 1 % GEL Apply 2 g topically at bedtime. Legs,knees,feet,wrist and shoulder 12/02/20   [provider]  ?escitalopram (LEXAPRO) 10 MG tablet Take 10 mg by mouth daily. 11/24/20   [provider]  ?FLOVENT HFA 44 MCG/ACT inhaler Inhale 2 puffs into the lungs in the morning and at bedtime. 02/22/21   [provider]  ?fluticasone (FLONASE) 50 MCG/ACT nasal spray Place 2 sprays into both nostrils in the morning and at bedtime. 10/29/21   [provider]  ?gabapentin (NEURONTIN) 100 MG capsule Take 100 mg by mouth in the morning, at noon, and at bedtime. 02/13/21   [provider]  ?levothyroxine (SYNTHROID) 50 MCG tablet Take 50 mcg by mouth daily before breakfast. 02/09/21   [provider]  ?LORazepam (ATIVAN) 1 MG tablet Take 1 tablet (1 mg total) by mouth at bedtime. 12/18/21   Alwyn RenMathews, Elizabeth G, MD  ?meclizine (ANTIVERT) 25 MG tablet Take 25 mg by mouth 3 (three) times daily as needed for dizziness.    [provider]  ?melatonin 5 MG TABS Take 5 mg by mouth at bedtime.    [provider]  ?montelukast (SINGULAIR) 10 MG tablet Take 10 mg by mouth daily. 02/13/21   [provider]   ?multivitamin-lutein (OCUVITE-LUTEIN) CAPS capsule Take 1 capsule by mouth at bedtime.    [provider]  ?NON FORMULARY CPAP at bedtime    [provider]  ?olmesartan (BENICAR) 40 MG tablet Take 40 mg by mouth daily. 02/21/21   [provider]  ?ondansetron (ZOFRAN) 4 MG tablet Take 4 mg by mouth every 8 (eight) hours as needed for nausea or vomiting.    [provider]  ?OZEMPIC, 0.25 OR 0.5 MG/DOSE, 2 MG/1.5ML SOPN Inject 0.25 mg into the skin every Tuesday. 02/27/21   [provider]  ?saccharomyces boulardii (FLORASTOR) 250 MG capsule Take 1 capsule (250 mg total) by mouth 2 (two) times daily. 12/18/21   Alwyn RenMathews, Elizabeth G, MD  ?simethicone (MYLICON) 80 MG chewable tablet Chew 1 tablet (80 mg total) by mouth 4 (  four) times daily. 12/18/21   Alwyn Ren, MD  ?verapamil (CALAN-SR) 120 MG CR tablet Take 360 mg by mouth daily. 02/13/21   [provider]  ?Vibegron (GEMTESA) 75 MG TABS Take 75 mg by mouth at bedtime.    [provider]  ? ? ?Physical Exam: ?Vitals:  ? 01/02/22 1500 01/02/22 1530 01/02/22 1637 01/02/22 1734  ?BP: 108/61 124/66  128/79  ?Pulse: 77 75  70  ?Resp: 17 17  16   ?Temp:   98.7 ?F (37.1 ?C) 98.6 ?F (37 ?C)  ?TempSrc:    Oral  ?SpO2: 95% 97%  96%  ?Weight:      ?Height:      ? ?General:  Appears calm and comfortable and is in NAD ?Eyes:  PERRL, EOMI, normal lids, iris ?ENT:  grossly normal hearing, lips & tongue, mmm; appropriate dentition ?Neck:  no LAD, masses or thyromegaly; no carotid bruits ?Cardiovascular:  RRR, no m/r/g. No LE edema. TTP under left breast  ?Respiratory:   CTA bilaterally with no wheezes/rales/rhonchi.  Normal respiratory effort. ?Abdomen:  soft, NT, ND, NABS ?Back:   normal alignment, no CVAT ?Skin:  no rash or induration seen on limited exam ?Musculoskeletal:  grossly normal tone BUE/BLE, good ROM, no bony abnormality ?Lower extremity:  No LE edema.  Limited foot exam with no ulcerations.  2+ distal  pulses. ?Psychiatric:  grossly normal mood and affect, speech fluent and appropriate, AOx3 ?Neurologic:  CN 2-12 grossly intact, moves all extremities in coordinated fashion, sensation intact ? ? ?Radiological Exams on

## 2022-01-02 NOTE — Progress Notes (Signed)
?  Echocardiogram ?2D Echocardiogram has been performed. ? ?Courtney [name] ?01/02/2022, 4:35 PM ?

## 2022-01-02 NOTE — Assessment & Plan Note (Signed)
cpap at night  

## 2022-01-02 NOTE — ED Notes (Signed)
The pt just returned from somewhere outside the dept ?

## 2022-01-02 NOTE — Progress Notes (Signed)
Pt refusing cpap for the night. ?

## 2022-01-02 NOTE — Assessment & Plan Note (Addendum)
Continue statin, repeat lipid panel in AM  ?Lipid panel 02/2021 LDL of 54  ?

## 2022-01-02 NOTE — Assessment & Plan Note (Addendum)
Ok to restart home medications ?

## 2022-01-02 NOTE — ED Provider Notes (Signed)
?  Physical Exam  ?BP 121/65   Pulse 77   Temp 98.5 ?F (36.9 ?C) (Oral)   Resp 15   Ht 5\' 2"  (1.575 m)   Wt 86.2 kg   SpO2 91%   BMI 34.75 kg/m?  ? ?Physical Exam ?Vitals and nursing note reviewed.  ?Constitutional:   ?   General: She is not in acute distress. ?   Appearance: She is not toxic-appearing.  ?HENT:  ?   Head: Normocephalic and atraumatic.  ?Eyes:  ?   Extraocular Movements: Extraocular movements intact.  ?   Pupils: Pupils are equal, round, and reactive to light.  ?Cardiovascular:  ?   Rate and Rhythm: Normal rate and regular rhythm.  ?Pulmonary:  ?   Effort: Pulmonary effort is normal. No respiratory distress.  ?   Breath sounds: No decreased breath sounds, wheezing or rhonchi.  ?Chest:  ?   Chest wall: Tenderness present.  ?Abdominal:  ?   Palpations: Abdomen is soft.  ?Musculoskeletal:  ?   Right lower leg: No tenderness. No edema.  ?   Left lower leg: No tenderness. No edema.  ?Skin: ?   Coloration: Skin is not jaundiced or pale.  ?Neurological:  ?   Mental Status: She is alert and oriented to person, place, and time.  ?Psychiatric:     ?   Behavior: Behavior normal.  ? ? ?Procedures  ?Procedures ? ?ED Course / MDM  ? ?Clinical Course as of 01/02/22 1548  ?Wed Jan 02, 2022  ?1314 Troponin I (High Sensitivity) [JN]  ?1314 Troponin I (High Sensitivity): 7 [JN]  ?1314 Troponin I (High Sensitivity) [JN]  ?  ?Clinical Course User Index ?[JN] [name], Student-PA  ? ?Medical Decision Making ?Amount and/or Complexity of Data Reviewed ?Labs: ordered. ?Radiology: ordered. ? ?Risk ?Prescription drug management. ?Decision regarding hospitalization. ? ? ?Patient signed out at shift change.  Please see previous provider note for further details. ? ?Dr. Rogers Blocker, hospitalist, has agreed to admit the patient.  The plan will be to admit the patient through hospitalist service and have cardiology follow-up tomorrow for nuclear stress test.  Patient stable at time of admission ? ? ? ? ?  ?[name], PA-C ?01/02/22 1550 ? ?  ?Lorelle Gibbs, DO ?01/02/22 1823 ? ?

## 2022-01-03 ENCOUNTER — Observation Stay (HOSPITAL_COMMUNITY): Payer: Medicare HMO

## 2022-01-03 DIAGNOSIS — G4733 Obstructive sleep apnea (adult) (pediatric): Secondary | ICD-10-CM

## 2022-01-03 DIAGNOSIS — I2583 Coronary atherosclerosis due to lipid rich plaque: Secondary | ICD-10-CM | POA: Diagnosis not present

## 2022-01-03 DIAGNOSIS — E785 Hyperlipidemia, unspecified: Secondary | ICD-10-CM | POA: Diagnosis not present

## 2022-01-03 DIAGNOSIS — Z8673 Personal history of transient ischemic attack (TIA), and cerebral infarction without residual deficits: Secondary | ICD-10-CM | POA: Diagnosis not present

## 2022-01-03 DIAGNOSIS — R399 Unspecified symptoms and signs involving the genitourinary system: Secondary | ICD-10-CM | POA: Diagnosis not present

## 2022-01-03 DIAGNOSIS — R079 Chest pain, unspecified: Secondary | ICD-10-CM | POA: Diagnosis not present

## 2022-01-03 DIAGNOSIS — F331 Major depressive disorder, recurrent, moderate: Secondary | ICD-10-CM | POA: Diagnosis not present

## 2022-01-03 DIAGNOSIS — I2510 Atherosclerotic heart disease of native coronary artery without angina pectoris: Secondary | ICD-10-CM | POA: Diagnosis not present

## 2022-01-03 DIAGNOSIS — E039 Hypothyroidism, unspecified: Secondary | ICD-10-CM | POA: Diagnosis not present

## 2022-01-03 LAB — CBC
HCT: 33.8 % — ABNORMAL LOW (ref 36.0–46.0)
Hemoglobin: 10.5 g/dL — ABNORMAL LOW (ref 12.0–15.0)
MCH: 28.8 pg (ref 26.0–34.0)
MCHC: 31.1 g/dL (ref 30.0–36.0)
MCV: 92.9 fL (ref 80.0–100.0)
Platelets: 308 10*3/uL (ref 150–400)
RBC: 3.64 MIL/uL — ABNORMAL LOW (ref 3.87–5.11)
RDW: 15.1 % (ref 11.5–15.5)
WBC: 6.9 10*3/uL (ref 4.0–10.5)
nRBC: 0 % (ref 0.0–0.2)

## 2022-01-03 LAB — COMPREHENSIVE METABOLIC PANEL
ALT: 25 U/L (ref 0–44)
AST: 31 U/L (ref 15–41)
Albumin: 2.5 g/dL — ABNORMAL LOW (ref 3.5–5.0)
Alkaline Phosphatase: 75 U/L (ref 38–126)
Anion gap: 8 (ref 5–15)
BUN: 7 mg/dL — ABNORMAL LOW (ref 8–23)
CO2: 27 mmol/L (ref 22–32)
Calcium: 8.8 mg/dL — ABNORMAL LOW (ref 8.9–10.3)
Chloride: 102 mmol/L (ref 98–111)
Creatinine, Ser: 0.88 mg/dL (ref 0.44–1.00)
GFR, Estimated: >60 mL/min (ref >60)
Glucose, Bld: 96 mg/dL (ref 70–99)
Potassium: 3.6 mmol/L (ref 3.5–5.1)
Sodium: 137 mmol/L (ref 135–145)
Total Bilirubin: 0.1 mg/dL — ABNORMAL LOW (ref 0.3–1.2)
Total Protein: 5.5 g/dL — ABNORMAL LOW (ref 6.5–8.1)

## 2022-01-03 LAB — LIPID PANEL
Cholesterol: 102 mg/dL (ref 0–200)
HDL: 33 mg/dL — ABNORMAL LOW (ref >40)
LDL Cholesterol: 42 mg/dL (ref 0–99)
Total CHOL/HDL Ratio: 3.1 RATIO
Triglycerides: 133 mg/dL (ref <150)
VLDL: 27 mg/dL (ref 0–40)

## 2022-01-03 LAB — RESP PANEL BY RT-PCR (FLU A&B, COVID) ARPGX2
Influenza A by PCR: NEGATIVE
Influenza B by PCR: NEGATIVE
SARS Coronavirus 2 by RT PCR: NEGATIVE

## 2022-01-03 LAB — NM MYOCAR MULTI W/SPECT W/WALL MOTION / EF
Estimated workload: 1
Exercise duration (min): 5 min
Exercise duration (sec): 15 s
Peak HR: 111 {beats}/min
Rest HR: 75 {beats}/min
ST Depression (mm): 0 mm

## 2022-01-03 MED ORDER — REGADENOSON 0.4 MG/5ML IV SOLN
INTRAVENOUS | Status: AC
  Administered 2022-01-03: 0.4 mg via INTRAVENOUS
  Filled 2022-01-03: qty 5

## 2022-01-03 MED ORDER — TECHNETIUM TC 99M TETROFOSMIN IV KIT
30.6000 | PACK | Freq: Once | INTRAVENOUS | Status: AC | PRN
  Administered 2022-01-03: 30.6 via INTRAVENOUS

## 2022-01-03 MED ORDER — TECHNETIUM TC 99M TETROFOSMIN IV KIT
10.5000 | PACK | Freq: Once | INTRAVENOUS | Status: AC | PRN
  Administered 2022-01-03: 10.5 via INTRAVENOUS

## 2022-01-03 MED ORDER — FOSFOMYCIN TROMETHAMINE 3 G PO PACK
3.0000 g | PACK | Freq: Once | ORAL | Status: AC
  Administered 2022-01-03: 3 g via ORAL
  Filled 2022-01-03: qty 3

## 2022-01-03 MED ORDER — REGADENOSON 0.4 MG/5ML IV SOLN: 0.4000 mg | Freq: Once | INTRAVENOUS | Status: AC

## 2022-01-03 MED ORDER — LORAZEPAM 1 MG PO TABS: 1.0000 mg | ORAL_TABLET | Freq: Every day | ORAL | 0 refills | Status: DC

## 2022-01-03 NOTE — TOC Initial Note (Addendum)
Transition of Care (TOC) - Initial/Assessment Note  ? ? ?Patient Details  ?Name: Courtney [name] ?MRN: [id number] ?Date of Birth: 1946/08/26 ? ?Transition of Care (TOC) CM/SW Contact:    ?Delilah Shan, LCSWA ?Phone Number: ?01/03/2022, 3:56 PM ? ?Clinical Narrative:                 ? ?CSW received consult for possible SNF placement at time of discharge. CSW spoke with patient regarding PT recommendation of SNF placement at time of discharge.  Patient is from [facility]. Patient lives in Independent living at [place] with her spouse.Patient expressed understanding of PT recommendation and is agreeable to SNF placement at time of discharge. Patient reports preference to return to [facility] short term rehab. CSW discussed insurance authorization process with patient.Patient reports she has received the COVID vaccines as well as 2 boosters. Patient expressed being hopeful for rehab and to feel better soon. No further questions reported at this time. CSW spoke with Tresa Endo at [facility] who confirmed short term rehab bed for patient. Tresa Endo confirmed she will start insurance authorization for patient. Insurance authorization currently pending. CSW to continue to follow and assist with discharge planning needs.  ? ?Expected Discharge Plan: Skilled Nursing Facility ?Barriers to Discharge: Continued Medical Work up ? ? ?Patient Goals and CMS Choice ?Patient states their goals for this hospitalization and ongoing recovery are:: SNF ?CMS Medicare.gov Compare Post Acute Care list provided to:: Patient ?Choice offered to / list presented to : Patient ? ?Expected Discharge Plan and Services ?Expected Discharge Plan: Skilled Nursing Facility ?In-house Referral: Clinical Social Work ?  ?  ?Living arrangements for the past 2 months: Skilled Nursing Facility (From [facility] short term rehab lives at [place] independ[facility] living) ?Expected Discharge Date: 01/03/22               ?  ?  ?  ?  ?  ?  ?  ?  ?  ?   ? ?Prior Living Arrangements/Services ?Living arrangements for the past 2 months: Skilled Nursing Facility (From [facility] short term rehab lives at [place] independ[facility] living) ?Lives with:: Self, Spouse ?Patient language and need for interpreter reviewed:: Yes ?Do you feel safe going back to the place where you live?: No   SNF  ?Need for Family Participation in Patient Care: Yes (Comment) ?Care giver support system in place?: Yes (comment) ?  ?Criminal Activity/Legal Involvement Pertinent to Current Situation/Hospitalization: No - Comment as needed ? ?Activities of Daily Living ?  ?  ? ?Permission Sought/Granted ?Permission sought to share information with : Case Manager, Family Supports, Magazine features editor ?Permission granted to share information with : Yes, Verbal Permission Granted ? Share Information with NAME: Chanetta Marshall ? Permission granted to share info w AGENCY: SNF ? Permission granted to share info w Relationship: spouse ? Permission granted to share info w Contact Information: Chanetta Marshall [phone] ? ?Emotional Assessment ?  ?Attitude/Demeanor/Rapport: Gracious ?Affect (typically observed): Calm ?Orientation: : Oriented to Self, Oriented to Place, Oriented to  Time, Oriented to Situation (WDL) ?Alcohol / Substance Use: Not Applicable ?Psych Involvement: No (comment) ? ?Admission diagnosis:  Chest pain [R07.9] ?Chest pain, unspecified type [R07.9] ?Patient Active Problem List  ? Diagnosis Date Noted  ? Anxiety 01/02/2022  ? Chest pain 01/02/2022  ? Moderate recurrent major depression and anxiety  01/02/2022  ? History of CVA (cerebrovascular accident) 01/02/2022  ? Urinary tract infection symptoms 01/02/2022  ? Diverticulitis 12/07/2021  ? Acute diverticulitis of intestine 12/07/2021  ? Diarrhea   ?  COVID-19 infection with colitis 11/18/2021  ? CAD (coronary artery disease) 11/18/2021  ? Colitis with rectal bleeding 11/18/2021  ? Hypertension 04/02/2021  ? Dyslipidemia 04/02/2021  ?  Hypothyroidism (acquired) 04/02/2021  ? OSA (obstructive sleep apnea) 04/02/2021  ? Cervical spondylosis with myelopathy and radiculopathy 03/28/2021  ? ?PCP:  Georgann Housekeeper, MD ?Pharmacy:   ?[id number] - [place], [place] [phone] ?[address] ?[place] [address] ?Phone: [phone] Fax: [phone] ? ? ? ? ?Social Determinants of Health (SDOH) Interventions ?  ? ?Readmission Risk Interventions ?No flowsheet data found. ? ? ?

## 2022-01-03 NOTE — Evaluation (Signed)
Occupational Therapy Evaluation ?Patient Details ?Name: Courtney [name] ?MRN: [id number] ?DOB: 03/30/1946 ?Today's Date: 01/03/2022 ? ? ?History of Present Illness 76 y.o. F admitted on 01/02/22 due to chest pain. Recently hospitalized twice in February due to diverticulitis, colitis, and c-diff. PMH significant for bipolar disorder, HTN, mitral valve prolapse, claustrophobia, CVA, vertigo, PE, prior back surgery, B knee replacements.  ? ?Clinical Impression ?  ?Pt admitted for concerns listed above. PTA pt reported that she was in [facility] where she was receiving therapy since her last hospital admission. At this time, pt presenting with decreased activity tolerance and weakness. She is able to complete functional mobility for short periods with min guard and RW, requires min guard to mod A for ADL's, and increased time/rest breaks due to fatigue. Pt will benefit from returning to the [facility] center at [facility] for further skilled therapy. OT will follow acutely.  ?   ? ?Recommendations for follow up therapy are one component of a multi-disciplinary discharge planning process, led by the attending physician.  Recommendations may be updated based on patient status, additional functional criteria and insurance authorization.  ? ?Follow Up Recommendations ? Skilled nursing-short term rehab (<3 hours/day) [facility] [place])  ?  ?Assistance Recommended at Discharge Intermittent Supervision/Assistance  ?Patient can return home with the following A little help with walking and/or transfers;A little help with bathing/dressing/bathroom;Assistance with cooking/housework ? ?  ?Functional Status Assessment ? Patient has had a recent decline in their functional status and demonstrates the ability to make significant improvements in function in a reasonable and predictable amount of time.  ?Equipment Recommendations ? None recommended by OT  ?  ?Recommendations for Other Services   ? ? ?  ?Precautions  / Restrictions Precautions ?Precautions: Fall ?Restrictions ?Weight Bearing Restrictions: No  ? ?  ? ?Mobility Bed Mobility ?Overal bed mobility: Needs Assistance ?Bed Mobility: Supine to Sit, Sit to Supine ?  ?  ?Supine to sit: Min assist ?Sit to supine: Min assist ?  ?General bed mobility comments: Pt needs min A to elevate trunk to sitting, as well as to bring BLE back into bed to return to supine ?  ? ?Transfers ?Overall transfer level: Needs assistance ?Equipment used: Rolling walker (2 wheels) ?Transfers: Sit to/from Stand ?Sit to Stand: Min assist ?  ?  ?  ?  ?  ?General transfer comment: Min A to power up and steady ?  ? ?  ?Balance Overall balance assessment: Needs assistance ?Sitting-balance support: Feet supported ?Sitting balance-Leahy Scale: Good ?  ?  ?Standing balance support: Bilateral upper extremity supported, Single extremity supported, During functional activity ?Standing balance-Leahy Scale: Poor ?Standing balance comment: Reliant on RW or support of UE when completing grooming at sink ?  ?  ?  ?  ?  ?  ?  ?  ?  ?  ?  ?   ? ?ADL either performed or assessed with clinical judgement  ? ?ADL Overall ADL's : Needs assistance/impaired ?Eating/Feeding: Independent;Sitting ?  ?Grooming: Supervision/safety;Standing ?  ?Upper Body Bathing: Supervision/ safety;Sitting ?  ?Lower Body Bathing: Moderate assistance;Sitting/lateral leans;Sit to/from stand ?  ?Upper Body Dressing : Supervision/safety;Sitting ?  ?Lower Body Dressing: Moderate assistance;Sitting/lateral leans;Sit to/from stand ?  ?Toilet Transfer: Minimal assistance;Ambulation ?  ?Toileting- Clothing Manipulation and Hygiene: Min guard;Sitting/lateral lean;Sit to/from stand ?  ?  ?  ?Functional mobility during ADLs: Min guard;Rolling walker (2 wheels) ?General ADL Comments: Pt limited by decreased activity tolerance, weakness  ? ? ? ?  Vision Baseline Vision/History: 0 No visual deficits ?Ability to See in Adequate Light: 0 Adequate ?Patient Visual  Report: No change from baseline ?Vision Assessment?: No apparent visual deficits  ?   ?Perception   ?  ?Praxis   ?  ? ?Pertinent Vitals/Pain Pain Assessment ?Pain Assessment: No/denies pain  ? ? ? ?Hand Dominance Right ?  ?Extremity/Trunk Assessment Upper Extremity Assessment ?Upper Extremity Assessment: Generalized weakness ?  ?Lower Extremity Assessment ?Lower Extremity Assessment: Defer to PT evaluation ?  ?Cervical / Trunk Assessment ?Cervical / Trunk Assessment: Normal ?  ?Communication Communication ?Communication: No difficulties ?  ?Cognition Arousal/Alertness: Awake/alert ?Behavior During [facility]: [facility] for tasks assessed/performed ?Overall Cognitive Status: Within Functional Limits for tasks assessed ?  ?  ?  ?  ?  ?  ?  ?  ?  ?  ?  ?  ?  ?  ?  ?  ?  ?  ?  ?General Comments  VSS on RA ? ?  ?Exercises   ?  ?Shoulder Instructions    ? ? ?Home Living Family/patient expects to be discharged to:: Private residence ?Living Arrangements: Spouse/significant other ?Available Help at Discharge: Family;Available 24 hours/day ?Type of Home: Independent living facility ?Home Access: Elevator ?  ?  ?Home Layout: One level ?  ?  ?Bathroom Shower/Tub: Walk-in shower ?  ?Bathroom Toilet: Handicapped height ?Bathroom Accessibility: Yes ?  ?Home Equipment: Rollator (4 wheels);Grab bars - toilet;Grab bars - tub/shower;Shower seat - built in;Cane - quad;Cane - single point ?  ?Additional Comments: [facility], recieving therapy 5 days a week. ?  ? ?  ?Prior Functioning/Environment Prior Level of Function : Needs assist ?  ?  ?  ?  ?  ?  ?Mobility Comments: using a rollator, could walk long hallway lengths with close supervision ?ADLs Comments: min-mod A dressing and bathing ?  ? ?  ?  ?OT Problem List: Decreased strength;Decreased activity tolerance;Impaired balance (sitting and/or standing) ?  ?   ?OT Treatment/Interventions: Self-care/ADL training;Therapeutic exercise;Energy  conservation;DME and/or AE instruction;Therapeutic activities;Patient/family education;Balance training  ?  ?OT Goals(Current goals can be found in the care plan section) Acute Rehab OT Goals ?Patient Stated Goal: To get stronger ?OT Goal Formulation: With patient ?Time For Goal Achievement: 01/17/22 ?Potential to Achieve Goals: Good ?ADL Goals ?Additional ADL Goal #1: Pt will tolerate standing functional tasks for 5 mins to improve activity tolerance. ?Additional ADL Goal #2: Pt will complete bed mobility with supervision as a precursor to seated ADL tasks.  ?OT Frequency: Min 2X/week ?  ? ?Co-evaluation   ?  ?  ?  ?  ? ?  ?AM-PAC OT "6 Clicks" Daily Activity     ?Outcome Measure Help from another person eating meals?: None ?Help from another person taking care of personal grooming?: A Little ?Help from another person toileting, which includes using toliet, bedpan, or urinal?: A Little ?Help from another person bathing (including washing, rinsing, drying)?: A Lot ?Help from another person to put on and taking off regular upper body clothing?: A Little ?Help from another person to put on and taking off regular lower body clothing?: A Lot ?6 Click Score: 17 ?  ?End of Session Equipment Utilized During Treatment: Rolling walker (2 wheels) ?Nurse Communication: Mobility status ? ?Activity Tolerance: Patient tolerated treatment well ?Patient left: in bed;with call bell/phone within reach;with nursing/sitter in room;with family/visitor present ? ?OT Visit Diagnosis: Unsteadiness on feet (R26.81);Other abnormalities of gait and mobility (R26.89);Muscle  weakness (generalized) (M62.81)  ?              ?Time: 1191-4782 ?OT Time Calculation (min): 21 min ?Charges:  OT General Charges ?$OT Visit: 1 Visit ?OT Evaluation ?$OT Eval Moderate Complexity: 1 Mod ? ?Saburo Luger H., OTR/L ?Acute Rehabilitation ? ?Benedetta Sundstrom Elane Bing Plume ?01/03/2022, 2:22 PM ?

## 2022-01-03 NOTE — [place] FL2 (Signed)
?[place] MEDICAID FL2 LEVEL OF CARE SCREENING TOOL  ?  ? ?IDENTIFICATION  ?Patient Name: ?Courtney [name] Birthdate: 1946/10/18 Sex: female Admission Date (Current Location): ?01/02/2022  ?[place] Number: ? [place] ?  Facility and Address:  ?[facility], [address], [place], [place] [address] ?     Provider Number: ?[id number]  ?Attending Physician Name and Address:  ?Tyrone Nine, MD ? Relative Name and Phone Number:  ?Chanetta Marshall [phone] ?   ?Current Level of Care: ?Hospital Recommended Level of Care: ?Skilled Nursing Facility Prior Approval Number: ?  ? ?Date Approved/Denied: ?  PASRR Number: ?[id number] A ? ?Discharge Plan: ?SNF ?  ? ?Current Diagnoses: ?Patient Active Problem List  ? Diagnosis Date Noted  ? Anxiety 01/02/2022  ? Chest pain 01/02/2022  ? Moderate recurrent major depression and anxiety  01/02/2022  ? History of CVA (cerebrovascular accident) 01/02/2022  ? Urinary tract infection symptoms 01/02/2022  ? Diverticulitis 12/07/2021  ? Acute diverticulitis of intestine 12/07/2021  ? Diarrhea   ? COVID-19 infection with colitis 11/18/2021  ? CAD (coronary artery disease) 11/18/2021  ? Colitis with rectal bleeding 11/18/2021  ? Hypertension 04/02/2021  ? Dyslipidemia 04/02/2021  ? Hypothyroidism (acquired) 04/02/2021  ? OSA (obstructive sleep apnea) 04/02/2021  ? Cervical spondylosis with myelopathy and radiculopathy 03/28/2021  ? ? ?Orientation RESPIRATION BLADDER Height & Weight   ?  ?Self, Time, Situation, Place (WDL) ? Normal Continent Weight: 190 lb (86.2 kg) ?Height:  5\' 2"  (157.5 cm)  ?BEHAVIORAL SYMPTOMS/MOOD NEUROLOGICAL BOWEL NUTRITION STATUS  ?    Continent Diet (Please see discharge summary)  ?AMBULATORY STATUS COMMUNICATION OF NEEDS Skin   ?Limited Assist Verbally Other (Comment) (Appropriate for ethnicity,dry,ecchymosis,abdomen,arm,bilateral,skin-tenting) ?  ?  ?  ?    ?     ?     ? ? ?Personal Care Assistance Level of Assistance  ?Bathing, Feeding,  Dressing Bathing Assistance: Limited assistance ?Feeding assistance: Independent (able to feed self) ?Dressing Assistance: Limited assistance ?   ? ?Functional Limitations Info  ?Sight, Hearing, Speech Sight Info: Impaired ?Hearing Info: Adequate ?Speech Info: Adequate  ? ? ?SPECIAL CARE FACTORS FREQUENCY  ?PT (By licensed PT), OT (By licensed OT)   ?  ?PT Frequency: 5x min weekly ?OT Frequency: 5x min weekly ?  ?  ?  ?   ? ? ?Contractures Contractures Info: Not present  ? ? ?Additional Factors Info  ?Code Status, Psychotropic, Isolation Precautions Code Status Info: FULL ?Allergies Info: Tetanus-diphtheria Toxoids Td,Meloxicam,Phentermine,Sulfa Antibiotics,Zithromax (azithromycin) ?Psychotropic Info: buPROPion (WELLBUTRIN XL) 24 hr tablet 150 mg daily,escitalopram (LEXAPRO) tablet 10 mg daily,LORazepam (ATIVAN) tablet 1 mg daily at bedtime ?  ?Isolation Precautions Info: Enteric precautions (UV disinfection) added 01/03/2022 ?   ? ?Current Medications (01/03/2022):  This is the current hospital active medication list ?Current Facility-Administered Medications  ?Medication Dose Route Frequency Provider Last Rate Last Admin  ? 0.9 %  sodium chloride infusion  250 mL Intravenous PRN 01/05/2022, MD      ? acetaminophen (TYLENOL) tablet 650 mg  650 mg Oral Q6H PRN Orland Mustard, MD   650 mg at 01/03/22 1547  ? Or  ? acetaminophen (TYLENOL) suppository 650 mg  650 mg Rectal Q6H PRN 01/05/22, MD      ? albuterol (VENTOLIN HFA) 108 (90 Base) MCG/ACT inhaler 1 puff  1 puff Inhalation Q6H PRN Orland Mustard, MD      ? aspirin tablet 325 mg  325 mg Oral Daily Orland Mustard, MD  325 mg at 01/03/22 0847  ? atorvastatin (LIPITOR) tablet 80 mg  80 mg Oral q1800 Orland Mustard, MD      ? budesonide (PULMICORT) nebulizer solution 0.25 mg  0.25 mg Nebulization BID Orland Mustard, MD   0.25 mg at 01/03/22 0802  ? buPROPion (WELLBUTRIN XL) 24 hr tablet 150 mg  150 mg Oral Daily Orland Mustard, MD   150 mg at 01/03/22 1332   ? enoxaparin (LOVENOX) injection 40 mg  40 mg Subcutaneous Q24H Orland Mustard, MD   40 mg at 01/02/22 2129  ? escitalopram (LEXAPRO) tablet 10 mg  10 mg Oral Daily Orland Mustard, MD   10 mg at 01/03/22 1332  ? fosfomycin (MONUROL) packet 3 g  3 g Oral Once Tyrone Nine, MD      ? gabapentin (NEURONTIN) capsule 100 mg  100 mg Oral TID Orland Mustard, MD   100 mg at 01/03/22 1547  ? levothyroxine (SYNTHROID) tablet 50 mcg  50 mcg Oral QAC breakfast Orland Mustard, MD   50 mcg at 01/03/22 0751  ? loratadine (CLARITIN) tablet 10 mg  10 mg Oral Daily Orland Mustard, MD   10 mg at 01/03/22 1332  ? LORazepam (ATIVAN) tablet 1 mg  1 mg Oral QHS Orland Mustard, MD   1 mg at 01/02/22 2129  ? meclizine (ANTIVERT) tablet 25 mg  25 mg Oral TID PRN Orland Mustard, MD      ? melatonin tablet 5 mg  5 mg Oral QHS Orland Mustard, MD   5 mg at 01/02/22 2129  ? montelukast (SINGULAIR) tablet 10 mg  10 mg Oral Daily Orland Mustard, MD   10 mg at 01/03/22 1332  ? morphine (PF) 2 MG/ML injection 1 mg  1 mg Intravenous Q3H PRN Orland Mustard, MD      ? ondansetron [facility]) injection 4 mg  4 mg Intravenous Q8H PRN Orland Mustard, MD      ? sodium chloride flush (NS) 0.9 % injection 3 mL  3 mL Intravenous Q12H Orland Mustard, MD   3 mL at 01/02/22 2129  ? sodium chloride flush (NS) 0.9 % injection 3 mL  3 mL Intravenous PRN Orland Mustard, MD      ? Vibegron TABS 75 mg  75 mg Oral QHS Orland Mustard, MD      ? ? ? ?Discharge Medications: ?Please see discharge summary for a list of discharge medications. ? ?Relevant Imaging Results: ? ?Relevant Lab Results: ? ? ?Additional Information ?[id number], Both Covid Vaccines and 2 boosters ? ?Delilah Shan, LCSWA ? ? ? ? ?

## 2022-01-03 NOTE — Progress Notes (Signed)
PT Cancellation Note ? ?Patient Details ?Name: Courtney [name] ?MRN: [id number] ?DOB: 05-20-1946 ? ? ?Cancelled Treatment:    Reason Eval/Treat Not Completed: Patient at procedure or test/unavailable. PT will follow up as schedule allows. ? ?Enis Slipper, SPT ? ?Enis Slipper ?01/03/2022, 11:18 AM ?

## 2022-01-03 NOTE — Evaluation (Signed)
Physical Therapy Evaluation ?Patient Details ?Name: Courtney [name] ?MRN: [id number] ?DOB: [birth year] ?Today's Date: 01/03/2022 ? ?History of Present Illness ? 76 y.o. F admitted on 01/02/22 due to chest pain. Recently hospitalized twice in February due to diverticulitis, colitis, and c-diff. PMH significant for bipolar disorder, HTN, mitral valve prolapse, claustrophobia, CVA, vertigo, PE, prior back surgery, B knee replacements.  ?Clinical Impression ? Patient presented to the ED on 01/02/22 with chest pain. Pt's impairments include decreased strength, power, balance, safety awareness, and activity tolerance. These impairments are limiting her ability to safely and independently transfer and ambulate. Patient requires min guard to mod A with all functional mobility. Pt required several attempts to power up for transfer with min A for lifting. SPT recommending SNF upon D/C due to mobility deficits. PT will continue to follow acutely to maximize pt's safety and independence with functional mobility. ?   ?   ? ?Recommendations for follow up therapy are one component of a multi-disciplinary discharge planning process, led by the attending physician.  Recommendations may be updated based on patient status, additional functional criteria and insurance authorization. ? ?Follow Up Recommendations Skilled nursing-short term rehab (<3 hours/day) ? ?  ?Assistance Recommended at Discharge Frequent or constant Supervision/Assistance  ?Patient can return home with the following ? A little help with bathing/dressing/bathroom;Assistance with cooking/housework;Direct supervision/assist for medications management;Direct supervision/assist for financial management;Assist for transportation;Help with stairs or ramp for entrance;A little help with walking and/or transfers ? ?  ?Equipment Recommendations None recommended by PT  ?Recommendations for Other Services ?    ?  ?Functional Status Assessment Patient has had a recent decline in  their functional status and demonstrates the ability to make significant improvements in function in a reasonable and predictable amount of time.  ? ?  ?Precautions / Restrictions Precautions ?Precautions: Fall ?Restrictions ?Weight Bearing Restrictions: No  ? ?  ? ?Mobility ? Bed Mobility ?Overal bed mobility: Needs Assistance ?Bed Mobility: Supine to Sit ?  ?  ?Supine to sit: Min assist, Mod assist ?  ?  ?  ?  ? ?Transfers ?Overall transfer level: Needs assistance ?Equipment used: Rolling walker (2 wheels) ?Transfers: Sit to/from Stand ?Sit to Stand: Min assist ?Stand pivot transfers: Min guard ?  ?  ?  ?  ?General transfer comment: pt requires multiple attempts to power up at EOB. Required cues for hand placement, education on rocking for momentum, and min A for lifting and steadying upon standing. required increased time to take forward and backward steps to get to recliner. Pt had slow, cautious gait feet shuffling instead of foot clearance with activity. ?  ? ?Ambulation/Gait ?  ?  ?  ?  ?  ?  ?  ?  ? ?Stairs ?  ?  ?  ?  ?  ? ?Wheelchair Mobility ?  ? ?Modified Rankin (Stroke Patients Only) ?  ? ?  ? ?Balance Overall balance assessment: Needs assistance ?Sitting-balance support: Feet supported, Single extremity supported ?Sitting balance-Leahy Scale: Poor ?Sitting balance - Comments: pt reliant on UE and close min guard to min A with sitting balance for trunk stability. ?  ?Standing balance support: Bilateral upper extremity supported, During functional activity, Reliant on assistive device for balance ?Standing balance-Leahy Scale: Poor ?Standing balance comment: reliant on BUE support using RW with standing and completing transfers. ?  ?  ?  ?  ?  ?  ?  ?  ?  ?  ?  ?   ? ? ? ?Pertinent Vitals/Pain  Pain Assessment ?Pain Assessment: Faces ?Faces Pain Scale: No hurt  ? ? ?Home Living Family/patient expects to be discharged to:: Private residence ?Living Arrangements: Spouse/significant other ?Available Help at  Discharge: Family;Available 24 hours/day ?Type of Home: Independent living facility ?Home Access: Elevator ?  ?  ?  ?Home Layout: One level ?Home Equipment: Rollator (4 wheels);Grab bars - toilet;Grab bars - tub/shower;Shower seat - built in;Cane - quad;Cane - single point ?Additional Comments: [facility], recieving therapy 5 days a week.  ?  ?Prior Function Prior Level of Function : Needs assist ?  ?  ?  ?Physical Assist : ADLs (physical);Mobility (physical) ?Mobility (physical): Bed mobility ?ADLs (physical): Bathing;Dressing ?Mobility Comments: using a rollator mostly; walks in hallways but requires sitting break and has husband push her in rollator the rest of the way to dining hall ?ADLs Comments: min-mod A dressing and bathing; uses device to donn socks and shoes ?  ? ? ?Hand Dominance  ? Dominant Hand: Right ? ?  ?Extremity/Trunk Assessment  ? Upper Extremity Assessment ?Upper Extremity Assessment: Defer to OT evaluation ?  ? ?Lower Extremity Assessment ?Lower Extremity Assessment: Generalized weakness ?  ? ?Cervical / Trunk Assessment ?Cervical / Trunk Assessment: Normal  ?Communication  ? Communication: No difficulties (speaks softly)  ?Cognition Arousal/Alertness: Awake/alert ?Behavior During Therapy: [facility] for tasks assessed/performed ?Overall Cognitive Status: Within Functional Limits for tasks assessed ?  ?  ?  ?  ?  ?  ?  ?  ?  ?  ?  ?  ?  ?  ?  ?  ?  ?  ?  ? ?  ?General Comments General comments (skin integrity, edema, etc.): VSS. Pt fatigued from stress test and OT today. ? ?  ?Exercises    ? ?Assessment/Plan  ?  ?PT Assessment Patient needs continued PT services  ?PT Problem List Decreased strength;Decreased activity tolerance;Decreased balance;Decreased mobility;Decreased coordination;Decreased safety awareness ? ?   ?  ?PT Treatment Interventions DME instruction;Gait training;Functional mobility training;Therapeutic activities;Therapeutic  exercise;Balance training;Neuromuscular re-education;Patient/family education   ? ?PT Goals (Current goals can be found in the Care Plan section)  ?Acute Rehab PT Goals ?Patient Stated Goal: to return to her independent living facility ?PT Goal Formulation: With patient/family ?Time For Goal Achievement: 01/17/22 ?Potential to Achieve Goals: Good ? ?  ?Frequency Min 2X/week ?  ? ? ?Co-evaluation   ?  ?  ?  ?  ? ? ?  ?AM-PAC PT "6 Clicks" Mobility  ?Outcome Measure Help needed turning from your back to your side while in a flat bed without using bedrails?: None ?Help needed moving from lying on your back to sitting on the side of a flat bed without using bedrails?: A Little ?Help needed moving to and from a bed to a chair (including a wheelchair)?: A Little ?Help needed standing up from a chair using your arms (e.g., wheelchair or bedside chair)?: A Little ?Help needed to walk in hospital room?: A Lot ?Help needed climbing 3-5 steps with a railing? : Total ?6 Click Score: 16 ? ?  ?End of Session Equipment Utilized During Treatment: Gait belt ?Activity Tolerance: Patient limited by fatigue ?Patient left: in chair;with call bell/phone within reach;with family/visitor present ?Nurse Communication: Mobility status ?PT Visit Diagnosis: Unsteadiness on feet (R26.81);Other abnormalities of gait and mobility (R26.89);Muscle weakness (generalized) (M62.81);Difficulty in walking, not elsewhere classified (R26.2) ?  ? ?Time: [id number] ?PT Time Calculation (min) (ACUTE ONLY): 22 min ? ? ?Charges:  PT Evaluation ?$PT Eval Low Complexity: 1 Low ?  ?  ?   ? ? ?Enis Slipper, SPT ? ?Enis Slipper ?01/03/2022, 3:14 PM ? ?

## 2022-01-03 NOTE — Progress Notes (Signed)
Patient is from [facility]. Patient was in short term rehab at [facility] prior to coming into hospital. CSW awaiting PT/OT recommendations. CSW will continue to follow and assist with patients dc planning needs. ?

## 2022-01-03 NOTE — Progress Notes (Signed)
? ?Primary Cardiology :  Courtney [name] ? ?Subjective:  ?Denies SSCP, palpitations or Dyspnea ? ? ?Objective:  ?Vitals:  ? 01/02/22 2249 01/03/22 0016 01/03/22 0518 01/03/22 0802  ?BP:  130/67 (!) 141/85   ?Pulse:  84 72   ?Resp:  16 15   ?Temp:  98.1 ?F (36.7 ?C) (!) 97.4 ?F (36.3 ?C)   ?TempSrc:  Oral Oral   ?SpO2: (S) 90% 98% 97% 96%  ?Weight:      ?Height:      ? ? ?Intake/Output from previous day: ? ?Intake/Output Summary (Last 24 hours) at 01/03/2022 0815 ?Last data filed at 01/03/2022 0700 ?Gross per 24 hour  ?Intake --  ?Output 1250 ml  ?Net -1250 ml  ? ? ?Physical Exam: ?Affect appropriate ?Healthy:  appears stated age ?HEENT: normal ?Neck supple with no adenopathy ?JVP normal no bruits no thyromegaly ?Lungs clear with no wheezing and good diaphragmatic motion ?Heart:  S1/S2 no murmur, no rub, gallop or click ?PMI normal ?Abdomen: benighn, BS positve, no tenderness, no AAA ?no bruit.  No HSM or HJR ?Distal pulses intact with no bruits ?No edema ?Neuro non-focal ?Skin warm and dry ?No muscular weakness ? ? ?Lab Results: ?Basic Metabolic Panel: ?Recent Labs  ?  01/02/22 ?1027 01/03/22 ?0313  ?NA 138 137  ?K 3.7 3.6  ?CL 103 102  ?CO2 24 27  ?GLUCOSE 91 96  ?BUN 5* 7*  ?CREATININE 0.59 0.88  ?CALCIUM 9.0 8.8*  ? ?Liver Function Tests: ?Recent Labs  ?  01/03/22 ?0313  ?AST 31  ?ALT 25  ?ALKPHOS 75  ?BILITOT 0.1*  ?PROT 5.5*  ?ALBUMIN 2.5*  ? ?No results for input(s): LIPASE, AMYLASE in the last 72 hours. ?CBC: ?Recent Labs  ?  01/02/22 ?1027 01/03/22 ?0313  ?WBC 9.2 6.9  ?HGB 11.0* 10.5*  ?HCT 35.8* 33.8*  ?MCV 92.5 92.9  ?PLT 339 308  ? ?Cardiac Enzymes: ?No results for input(s): CKTOTAL, CKMB, CKMBINDEX, TROPONINI in the last 72 hours. ?BNP: ?Invalid input(s): POCBNP ?D-Dimer: ?No results for input(s): DDIMER in the last 72 hours. ?Hemoglobin A1C: ?No results for input(s): HGBA1C in the last 72 hours. ?Fasting Lipid Panel: ?Recent Labs  ?  01/03/22 ?0313  ?CHOL 102  ?HDL 33*  ?[place] 42  ?TRIG 133  ?CHOLHDL 3.1   ? ?Thyroid Function Tests: ?No results for input(s): TSH, T4TOTAL, T3FREE, THYROIDAB in the last 72 hours. ? ?Invalid input(s): FREET3 ?Anemia Panel: ?No results for input(s): VITAMINB12, FOLATE, FERRITIN, TIBC, IRON, RETICCTPCT in the last 72 hours. ? ?Imaging: ?DG Chest Port 1 View ? ?Result Date: 01/02/2022 ?CLINICAL DATA:  Chest pain, LEFT chest pain under breast EXAM: PORTABLE CHEST 1 VIEW COMPARISON:  Portable exam 1045 hours compared to 12/16/2021 FINDINGS: Normal heart size, mediastinal contours, and pulmonary vascularity. Chronic elevation of RIGHT diaphragm with RIGHT basilar atelectasis. Chronic bronchitic changes. No pulmonary infiltrate, pleural effusion, or pneumothorax. Bones demineralized with BILATERAL glenohumeral degenerative changes and prior cervical fusion. IMPRESSION: Bronchitic changes with chronic RIGHT basilar atelectasis. No acute abnormalities. Electronically Signed   By: Lavonia Dana M.D.   On: 01/02/2022 10:51  ? ?ECHOCARDIOGRAM COMPLETE ? ?Result Date: 01/02/2022 ?   ECHOCARDIOGRAM REPORT   Patient Name:   Courtney [name] Date of Exam: 01/02/2022 Medical Rec #:  [id number]       Height:       62.0 in Accession #:    [id number]      Weight:       190.0 lb Date of Birth:  [birth year]       BSA:          1.870 m? Patient Age:    76 years        BP:           121/65 mmHg Patient Gender: F               HR:           75 bpm. Exam Location:  Inpatient Procedure: 2D Echo Indications:    chest pain. murmur. mitral valve disorder.  History:        Patient has no prior history of Echocardiogram examinations.                 Signs/Symptoms:Chest Pain and Murmur; Risk Factors:Sleep Apnea,                 Hypertension and Dyslipidemia.  Sonographer:    Johny Chess RDCS Referring Phys: [address]  1. Left ventricular ejection fraction, by estimation, is 60 to 65%. The left ventricle has normal function. The left ventricle has no regional wall motion abnormalities. There is  moderate asymmetric left ventricular hypertrophy of the basal-septal segment. Left ventricular diastolic parameters are consistent with Grade I diastolic dysfunction (impaired relaxation).  2. Right ventricular systolic function is normal. The right ventricular size is normal. There is normal pulmonary artery systolic pressure. The estimated right ventricular systolic pressure is Q000111Q mmHg.  3. The mitral valve is normal in structure. Trivial mitral valve regurgitation.  4. The aortic valve is tricuspid. Aortic valve regurgitation is not visualized. Aortic valve sclerosis/calcification is present, without any evidence of aortic stenosis. FINDINGS  Left Ventricle: Left ventricular ejection fraction, by estimation, is 60 to 65%. The left ventricle has normal function. The left ventricle has no regional wall motion abnormalities. The left ventricular internal cavity size was normal in size. There is  moderate asymmetric left ventricular hypertrophy of the basal-septal segment. Left ventricular diastolic parameters are consistent with Grade I diastolic dysfunction (impaired relaxation). Right Ventricle: The right ventricular size is normal. No increase in right ventricular wall thickness. Right ventricular systolic function is normal. There is normal pulmonary artery systolic pressure. The tricuspid regurgitant velocity is 2.31 m/s, and  with an assumed right atrial pressure of 3 mmHg, the estimated right ventricular systolic pressure is Q000111Q mmHg. Left Atrium: Left atrial size was normal in size. Right Atrium: Right atrial size was normal in size. Pericardium: There is no evidence of pericardial effusion. Presence of epicardial fat layer. Mitral Valve: The mitral valve is normal in structure. Trivial mitral valve regurgitation. Tricuspid Valve: The tricuspid valve is normal in structure. Tricuspid valve regurgitation is trivial. Aortic Valve: The aortic valve is tricuspid. Aortic valve regurgitation is not visualized.  Aortic valve sclerosis/calcification is present, without any evidence of aortic stenosis. Pulmonic Valve: The pulmonic valve was not well visualized. Pulmonic valve regurgitation is not visualized. Aorta: The aortic root and ascending aorta are structurally normal, with no evidence of dilitation. IAS/Shunts: The interatrial septum was not well visualized.  LEFT VENTRICLE PLAX 2D LVIDd:         5.00 cm   Diastology LVIDs:         3.40 cm   LV e' medial:    7.05 cm/s LV PW:         1.10 cm   LV E/e' medial:  10.1 LV IVS:        1.00 cm   LV e'  lateral:   8.70 cm/s LVOT diam:     1.90 cm   LV E/e' lateral: 8.2 LV SV:         71 LV SV Index:   38 LVOT Area:     2.84 cm?  RIGHT VENTRICLE RV S prime:     12.10 cm/s TAPSE (M-mode): 2.0 cm LEFT ATRIUM             Index LA diam:        4.50 cm 2.41 cm/m? LA Vol (A2C):   53.2 ml 28.44 ml/m? LA Vol (A4C):   48.3 ml 25.82 ml/m? LA Biplane Vol: 53.3 ml 28.50 ml/m?  AORTIC VALVE LVOT Vmax:   117.00 cm/s LVOT Vmean:  79.100 cm/s LVOT VTI:    0.252 m  AORTA Ao Root diam: 3.00 cm Ao Asc diam:  3.20 cm MITRAL VALVE                TRICUSPID VALVE MV Area (PHT): 3.72 cm?     TR Peak grad:   21.3 mmHg MV Decel Time: 204 msec     TR Vmax:        231.00 cm/s MV E velocity: 71.30 cm/s MV A velocity: 119.00 cm/s  SHUNTS MV E/A ratio:  0.60         Systemic VTI:  0.25 m                             Systemic Diam: 1.90 cm Oswaldo Milian MD Electronically signed by Oswaldo Milian MD Signature Date/Time: 01/02/2022/8:12:35 PM    Final    ? ?Cardiac Studies: ? ECG: NSR normal ECG  ? ? Telemetry:  NSR 01/03/2022  ? Echo: Normal EF 60-65% no RWMAls no valve dx ? ?Medications: ?  ? aspirin  325 mg Oral Daily  ? atorvastatin  80 mg Oral q1800  ? budesonide (PULMICORT) nebulizer solution  0.25 mg Nebulization BID  ? buPROPion  150 mg Oral Daily  ? enoxaparin (LOVENOX) injection  40 mg Subcutaneous Q24H  ? escitalopram  10 mg Oral Daily  ? gabapentin  100 mg Oral TID  ? levothyroxine  50 mcg  Oral QAC breakfast  ? loratadine  10 mg Oral Daily  ? LORazepam  1 mg Oral QHS  ? melatonin  5 mg Oral QHS  ? montelukast  10 mg Oral Daily  ? sodium chloride flush  3 mL Intravenous Q12H  ? Vibegron  75 mg Oral

## 2022-01-03 NOTE — Discharge Summary (Addendum)
?Physician Discharge Summary ?  ?Patient: Courtney [name] MRN: [id number] DOB: Jul 19, 1946  ?Admit date:     01/02/2022  ?Discharge date: 01/04/2022, actually discharged back to [facility] on 3/17, delayed by insurance authorization requirement.  ?Discharge Physician: Patrecia Pour  ? ?PCP: Wenda Low, MD  ? ?Recommendations at discharge:  ?Continue routine care including for HTN, HLD. ?Monitor for evidence of recurrent C. diff colitis.  ?Please monitor her for inadequately treated UTI. Her urine culture has grown 70k Pseudomonas. Without evidence of sepsis, having received fosfomycin 3g on 3/16, this should be adequate treatment, though it may not be. If symptoms persist, may need to repeat dose in 3 days vs. change to cipro (though hoping to avoid quinolone for a multitude of reasons, inclusive of her recent C. diff). I have communicated this rationale and recommendation to her husband. ? ?Discharge Diagnoses: ?Principal Problem: ?  Chest pain ?Active Problems: ?  Hypertension ?  Dyslipidemia ?  History of CVA (cerebrovascular accident) ?  Hypothyroidism (acquired) ?  Moderate recurrent major depression and anxiety  ?  OSA (obstructive sleep apnea) ?  CAD (coronary artery disease) ?  Urinary tract infection symptoms ? ?Hospital Course: ?Courtney [name] is a 76 y.o. female with medical history significant of   hypothyroidism, sleep apnea, hyperlipidemia, hypertension, 3V coronary calcification by Ct in 10/2021, depression/anxiety/bipolar, hx of CVA who presented to ED with complaints of chest pain. Also recently admitted on 2/16 for c.diff colitis with hypotension/AKI. She complains of chest pain that started this AM after eating breakfast. While walking to go to the bathroom she got pain in her left breast area. She states the pain felt like pressure and rated as a 7-8/10. She had associated spasms down her left arm, no radiation up jaw.she had associated nausea, no diaphoresis.  Pain  has been constant, she still  has this, but it's better with the morphine and zofran.  ?  ?She has no family hx of heart disease. She does not smoke or drink. No hx of diabetes. She does take cholesterol medication.  ?  ?No fever/chills, no headaches or vision changes, no palpitations, no shortness of breath, mild cough. No stomach pain, no recent N/V/D, no dysuria, but has pressure still. No leg swelling.  ?  ?Currently on medication for UTI. Diagnosed 5 days ago. Cant recall name  ? ?Cardiology recommended stress testing which was low risk showing no reversible defects (see full report below), and have cleared the patient for discharge to return to SNF. ? ?Assessment and Plan: ?* Chest pain ?76 year old female presenting with atypical chest pain, suggestive of MSK etiology.  ?- Tylenol prn. Consider alternative work up/Tx if persistent. ? ?Hypertension ?Ok to restart home medications ? ?Dyslipidemia ?Continue statin, repeat lipid panel in AM  ?Lipid panel 02/2021 LDL of 54  ? ?History of CVA (cerebrovascular accident) ?Continue ASA/statin. LDL to goal 02/2021 ? ?Hypothyroidism (acquired) ?TSH wnl in 02/2021 ?Continue home synthroid  ? ?Moderate recurrent major depression and anxiety  ?Continue wellbutrin, lexapro and ativan  ? ?OSA (obstructive sleep apnea) ?cpap at night  ? ?Urinary tract infection symptoms ?Persistently positive UA, culture pending. To abbreviate antibiotics as much as possible (recent hx C. diff colitis), gave fosfomycin x1.  ? ?CAD (coronary artery disease) ?Troponin wnl, ECG without acute ischemic changes, and echo without RWMA. Subsequent stress testing is low risk without reversible defects. CAD is not the cause of chest pain (which is reproducible on exam). 3V coronary calcification by  CT in 10/2021 ?- Continue ASA, statin, consider addition of beta blocker. ? ? ? ?Consultants: Cardiology ?Procedures performed: Lexiscan myovue stress test  ?Disposition: Skilled nursing facility ?Diet recommendation:  ?Discharge Diet Orders  (From admission, onward)  ? ?  Start     Ordered  ? 01/03/22 0000  Diet - low sodium heart healthy       ? 01/03/22 1517  ? ?  ?  ? ?  ? ?DISCHARGE MEDICATION: ?Allergies as of 01/04/2022   ? ?   Reactions  ? Tetanus-diphtheria Toxoids Td Anaphylaxis  ? Meloxicam Nausea And Vomiting  ? Phentermine Nausea And Vomiting  ? Sulfa Antibiotics Diarrhea, Nausea And Vomiting  ? Zithromax [azithromycin] Diarrhea, Nausea And Vomiting  ? ?  ? ?  ?Medication List  ?  ? ?TAKE these medications   ? ?acetaminophen 325 MG tablet ?Commonly known as: TYLENOL ?Take 650 mg by mouth every 6 (six) hours as needed for mild pain or fever. ?  ?albuterol 108 (90 Base) MCG/ACT inhaler ?Commonly known as: VENTOLIN HFA ?Inhale 1 puff into the lungs every 6 (six) hours as needed for wheezing or shortness of breath. ?  ?aspirin 325 MG tablet ?Take 325 mg by mouth daily. ?  ?atorvastatin 80 MG tablet ?Commonly known as: LIPITOR ?Take 80 mg by mouth every evening. ?  ?benzonatate 200 MG capsule ?Commonly known as: TESSALON ?Take 200 mg by mouth every 8 (eight) hours as needed for cough. ?  ?Biotin 5000 MCG Caps ?Take 5,000 mcg by mouth daily. ?  ?buPROPion 150 MG 24 hr tablet ?Commonly known as: WELLBUTRIN XL ?Take 150 mg by mouth daily. ?  ?cyanocobalamin 1000 MCG/ML injection ?Commonly known as: (VITAMIN B-12) ?Inject 1,000 mcg into the muscle every 30 (thirty) days. ?  ?diclofenac Sodium 1 % Gel ?Commonly known as: VOLTAREN ?Apply 2 g topically See admin instructions. Apply at bedtime to legs, knees, feet, and wrists ?  ?escitalopram 10 MG tablet ?Commonly known as: LEXAPRO ?Take 10 mg by mouth daily. ?  ?fexofenadine 60 MG tablet ?Commonly known as: ALLEGRA ?Take 60 mg by mouth daily. ?  ?Flovent HFA 44 MCG/ACT inhaler ?Generic drug: fluticasone ?Inhale 2 puffs into the lungs in the morning and at bedtime. ?  ?fluticasone 50 MCG/ACT nasal spray ?Commonly known as: FLONASE ?Place 2 sprays into both nostrils in the morning and at bedtime. ?   ?gabapentin 100 MG capsule ?Commonly known as: NEURONTIN ?Take 100 mg by mouth in the morning, at noon, and at bedtime. ?  ?Gemtesa 75 MG Tabs ?Generic drug: Vibegron ?Take 75 mg by mouth at bedtime. ?  ?levothyroxine 50 MCG tablet ?Commonly known as: SYNTHROID ?Take 50 mcg by mouth at bedtime. ?  ?Lidocaine 4 % Ptch ?Apply 1 patch topically See admin instructions. Apply 1 patch to the lower back once a day- on 12 hours/off 12 hours ?  ?LORazepam 1 MG tablet ?Commonly known as: ATIVAN ?Take 1 tablet (1 mg total) by mouth at bedtime. ?  ?meclizine 25 MG tablet ?Commonly known as: ANTIVERT ?Take 25 mg by mouth every 8 (eight) hours as needed for dizziness. ?  ?melatonin 5 MG Tabs ?Take 5 mg by mouth at bedtime. ?  ?montelukast 10 MG tablet ?Commonly known as: SINGULAIR ?Take 10 mg by mouth at bedtime. ?  ?multivitamin with minerals tablet ?Take 1 tablet by mouth daily. ?  ?olmesartan 40 MG tablet ?Commonly known as: BENICAR ?Take 40 mg by mouth daily. ?  ?ondansetron 4 MG tablet ?Commonly  known as: ZOFRAN ?Take 4 mg by mouth every 8 (eight) hours as needed for nausea or vomiting. ?  ?OXYGEN ?Inhale into the lungs as needed (to keep sats at 90% or greater). ?  ?Ozempic (0.25 or 0.5 MG/DOSE) 2 MG/1.5ML Sopn ?Generic drug: Semaglutide(0.25 or 0.5MG /DOS) ?Inject 0.25 mg into the skin every Tuesday. ?  ?polyethylene glycol 17 g packet ?Commonly known as: MIRALAX / GLYCOLAX ?Take 17 g by mouth daily. ?  ?PRESCRIPTION MEDICATION ?CPAP- At bedtime ?  ?saccharomyces boulardii 250 MG capsule ?Commonly known as: FLORASTOR ?Take 1 capsule (250 mg total) by mouth 2 (two) times daily. ?  ?simethicone 80 MG chewable tablet ?Commonly known as: MYLICON ?Chew 1 tablet (80 mg total) by mouth 4 (four) times daily. ?  ?verapamil 120 MG CR tablet ?Commonly known as: CALAN-SR ?Take 360 mg by mouth daily. ?  ?Vitamin D3 25 MCG (1000 UT) Caps ?Take 1,000 Units by mouth daily. ?  ? ?  ? ?Subjective: Feels well, no chest discomfort or dyspnea.  Has burning and pressure with urination. Wants to leave the hospital as soon as she's able to safely.  ? ?Discharge Exam: ?Filed Weights  ? 01/02/22 1020  ?Weight: 86.2 kg  ?No distress, pleasant ?RRR, no

## 2022-01-03 NOTE — Assessment & Plan Note (Signed)
Troponin wnl, ECG without acute ischemic changes, and echo without RWMA. Subsequent stress testing is low risk without reversible defects. CAD is not the cause of chest pain (which is reproducible on exam). 3V coronary calcification by CT in 10/2021 ?- Continue ASA, statin, consider addition of beta blocker. ? ?

## 2022-01-03 NOTE — Progress Notes (Signed)
Courtney [name] presented for a nuclear stress test today.  No immediate complications.  Stress imaging is pending at this time. ?  ?Preliminary EKG findings may be listed in the chart, but the stress test result will not be finalized until perfusion imaging is complete. ? ? ?Leanor Kail, [place]  ?01/03/2022 12:01 PM  ?

## 2022-01-04 ENCOUNTER — Observation Stay (HOSPITAL_COMMUNITY): Payer: Medicare HMO

## 2022-01-04 DIAGNOSIS — I2583 Coronary atherosclerosis due to lipid rich plaque: Secondary | ICD-10-CM | POA: Diagnosis not present

## 2022-01-04 DIAGNOSIS — Z20822 Contact with and (suspected) exposure to covid-19: Secondary | ICD-10-CM | POA: Diagnosis not present

## 2022-01-04 DIAGNOSIS — I5022 Chronic systolic (congestive) heart failure: Secondary | ICD-10-CM | POA: Diagnosis not present

## 2022-01-04 DIAGNOSIS — F331 Major depressive disorder, recurrent, moderate: Secondary | ICD-10-CM | POA: Diagnosis not present

## 2022-01-04 DIAGNOSIS — N390 Urinary tract infection, site not specified: Secondary | ICD-10-CM | POA: Diagnosis not present

## 2022-01-04 DIAGNOSIS — Z961 Presence of intraocular lens: Secondary | ICD-10-CM | POA: Diagnosis not present

## 2022-01-04 DIAGNOSIS — J45909 Unspecified asthma, uncomplicated: Secondary | ICD-10-CM | POA: Diagnosis not present

## 2022-01-04 DIAGNOSIS — R079 Chest pain, unspecified: Secondary | ICD-10-CM | POA: Diagnosis not present

## 2022-01-04 DIAGNOSIS — Z7982 Long term (current) use of aspirin: Secondary | ICD-10-CM | POA: Diagnosis not present

## 2022-01-04 DIAGNOSIS — R278 Other lack of coordination: Secondary | ICD-10-CM | POA: Diagnosis not present

## 2022-01-04 DIAGNOSIS — M6281 Muscle weakness (generalized): Secondary | ICD-10-CM | POA: Diagnosis not present

## 2022-01-04 DIAGNOSIS — Z8673 Personal history of transient ischemic attack (TIA), and cerebral infarction without residual deficits: Secondary | ICD-10-CM | POA: Diagnosis not present

## 2022-01-04 DIAGNOSIS — R399 Unspecified symptoms and signs involving the genitourinary system: Secondary | ICD-10-CM | POA: Diagnosis not present

## 2022-01-04 DIAGNOSIS — H52203 Unspecified astigmatism, bilateral: Secondary | ICD-10-CM | POA: Diagnosis not present

## 2022-01-04 DIAGNOSIS — N179 Acute kidney failure, unspecified: Secondary | ICD-10-CM | POA: Diagnosis not present

## 2022-01-04 DIAGNOSIS — H353131 Nonexudative age-related macular degeneration, bilateral, early dry stage: Secondary | ICD-10-CM | POA: Diagnosis not present

## 2022-01-04 DIAGNOSIS — R2689 Other abnormalities of gait and mobility: Secondary | ICD-10-CM | POA: Diagnosis not present

## 2022-01-04 DIAGNOSIS — E785 Hyperlipidemia, unspecified: Secondary | ICD-10-CM | POA: Diagnosis not present

## 2022-01-04 DIAGNOSIS — I10 Essential (primary) hypertension: Secondary | ICD-10-CM | POA: Diagnosis not present

## 2022-01-04 DIAGNOSIS — K5792 Diverticulitis of intestine, part unspecified, without perforation or abscess without bleeding: Secondary | ICD-10-CM | POA: Diagnosis not present

## 2022-01-04 DIAGNOSIS — G4733 Obstructive sleep apnea (adult) (pediatric): Secondary | ICD-10-CM | POA: Diagnosis not present

## 2022-01-04 DIAGNOSIS — I2510 Atherosclerotic heart disease of native coronary artery without angina pectoris: Secondary | ICD-10-CM | POA: Diagnosis not present

## 2022-01-04 DIAGNOSIS — F319 Bipolar disorder, unspecified: Secondary | ICD-10-CM | POA: Diagnosis not present

## 2022-01-04 DIAGNOSIS — Z8616 Personal history of COVID-19: Secondary | ICD-10-CM | POA: Diagnosis not present

## 2022-01-04 DIAGNOSIS — E039 Hypothyroidism, unspecified: Secondary | ICD-10-CM | POA: Diagnosis not present

## 2022-01-04 DIAGNOSIS — Z7901 Long term (current) use of anticoagulants: Secondary | ICD-10-CM | POA: Diagnosis not present

## 2022-01-04 NOTE — Progress Notes (Signed)
PROGRESS NOTE ? ?Brief Narrative/Subjective: ?Courtney [name] was admitted from [facility] on 3/15 for atypical chest pain. Cardiac enzymes and stress testing have been reassuring in the work up for ACS. We believe the pain is mostly likely musculoskeletal in etiology. She was discharged after receiving a dose of fosfomycin for symptomatic pyuria with urine culture pending on 3/16. Unfortunately her insurance required reauthorization prior to returning to SNF. I am told this has been obtained and she has remained stable for discharge since that time. She had no complaints this morning except that she wanted to go back yesterday. No chest pain, dyspnea, or GI symptoms. She continues to appear well with stable vital signs. ? ?Objective: ?BP 134/78 (BP Location: Right Arm)   Pulse 86   Temp 97.6 ?F (36.4 ?C) (Oral)   Resp (!) 23   Ht 5\' 2"  (1.575 m)   Wt 86.2 kg   SpO2 99%   BMI 34.75 kg/m?   ?Gen: No distress, well-appearing ?Pulm: Clear and nonlabored on room air  ?CV: RRR, no murmur, no JVD, no edema ? ?Assessment & Plan: ?This patient remains stable for discharge today.  ? ?Her urine culture has grown 70k Pseudomonas. Without evidence of sepsis, having received fosfomycin 3g on 3/16, this should (but not certainly) be adequately treated. If symptoms persist, may need to repeat dose in 3 days vs. change to cipro (though hoping to avoid quinolone for a multitude of reasons, inclusive of her recent C. diff). I have addended the DC summary originally authored on 3/16 to include that specific recommendation, update actual discharge date, and communicated this rationale and recommendation to her husband who will ensure follow up with their PA at [facility]. ? ?[facility], MD ?Pager on amion ?01/04/2022, 9:51 AM   ?

## 2022-01-04 NOTE — TOC Transition Note (Signed)
Transition of Care (TOC) - CM/SW Discharge Note ? ? ?Patient Details  ?Name: Courtney [name] ?MRN: [id number] ?Date of Birth: 07-Jul-1946 ? ?Transition of Care (TOC) CM/SW Contact:  ?Coralee Pesa, LCSWA ?Phone Number: ?01/04/2022, 10:12 AM ? ? ?Clinical Narrative:    ?Pt to be transported to facility by Spouse.  ?Nurse to call report to [phone]. ?No further needs from [facility] noted, CSW will sign off at this time. ? ? ?Final next level of care: [place] ?Barriers to Discharge: Barriers Resolved ? ? ?Patient Goals and CMS Choice ?Patient states their goals for this hospitalization and ongoing recovery are:: SNF ?CMS Medicare.gov Compare Post Acute Care list provided to:: Patient ?Choice offered to / list presented to : Patient ? ?Discharge Placement ?  ?           ?Patient chooses bed at: [facility] ?Patient to be transferred to facility by: Spouse ?Name of family member notified: Myers Hawks ?Patient and family notified of of transfer: 01/04/22 ? ?Discharge Plan and Services ?In-house Referral: Clinical Social Work ?  ?           ?  ?  ?  ?  ?  ?  ?  ?  ?  ?  ? ?Social Determinants of Health (SDOH) Interventions ?  ? ? ?Readmission Risk Interventions ?No flowsheet data found. ? ? ? ? ?

## 2022-01-04 NOTE — Progress Notes (Signed)
Report given to Amme Donaehy LPN, nurse working on unit today at [employer] [employer]. Questions answered and she will call with any further questions if needed.  ?

## 2022-01-04 NOTE — Progress Notes (Signed)
Attempted to call report twice. Left message at nursing supervisors phone to call for report. Leaving my name and number. Will attempt later if no call back received. ?

## 2022-01-05 LAB — URINE CULTURE: Culture: 70000 — AB

## 2022-01-08 DIAGNOSIS — R079 Chest pain, unspecified: Secondary | ICD-10-CM | POA: Diagnosis not present

## 2022-01-08 DIAGNOSIS — I2510 Atherosclerotic heart disease of native coronary artery without angina pectoris: Secondary | ICD-10-CM | POA: Diagnosis not present

## 2022-01-08 DIAGNOSIS — I10 Essential (primary) hypertension: Secondary | ICD-10-CM | POA: Diagnosis not present

## 2022-01-08 DIAGNOSIS — E785 Hyperlipidemia, unspecified: Secondary | ICD-10-CM | POA: Diagnosis not present

## 2022-01-09 DIAGNOSIS — Z8616 Personal history of COVID-19: Secondary | ICD-10-CM | POA: Diagnosis not present

## 2022-01-09 DIAGNOSIS — R2689 Other abnormalities of gait and mobility: Secondary | ICD-10-CM | POA: Diagnosis not present

## 2022-01-09 DIAGNOSIS — M6281 Muscle weakness (generalized): Secondary | ICD-10-CM | POA: Diagnosis not present

## 2022-01-11 DIAGNOSIS — Z961 Presence of intraocular lens: Secondary | ICD-10-CM | POA: Diagnosis not present

## 2022-01-11 DIAGNOSIS — H52203 Unspecified astigmatism, bilateral: Secondary | ICD-10-CM | POA: Diagnosis not present

## 2022-01-11 DIAGNOSIS — H353131 Nonexudative age-related macular degeneration, bilateral, early dry stage: Secondary | ICD-10-CM | POA: Diagnosis not present

## 2022-01-15 DIAGNOSIS — Z981 Arthrodesis status: Secondary | ICD-10-CM | POA: Diagnosis not present

## 2022-01-15 DIAGNOSIS — M4712 Other spondylosis with myelopathy, cervical region: Secondary | ICD-10-CM | POA: Diagnosis not present

## 2022-01-15 DIAGNOSIS — M545 Low back pain, unspecified: Secondary | ICD-10-CM | POA: Diagnosis not present

## 2022-01-15 DIAGNOSIS — Z6834 Body mass index (BMI) 34.0-34.9, adult: Secondary | ICD-10-CM | POA: Diagnosis not present

## 2022-01-15 DIAGNOSIS — G8929 Other chronic pain: Secondary | ICD-10-CM | POA: Diagnosis not present

## 2022-01-17 ENCOUNTER — Ambulatory Visit
Admission: RE | Admit: 2022-01-17 | Discharge: 2022-01-17 | Disposition: A | Discharge status: Home / Self Care | Payer: Medicare HMO | Source: Ambulatory Visit | Attending: Internal Medicine | Admitting: Internal Medicine

## 2022-01-17 DIAGNOSIS — M810 Age-related osteoporosis without current pathological fracture: Secondary | ICD-10-CM | POA: Diagnosis not present

## 2022-01-17 DIAGNOSIS — Z1231 Encounter for screening mammogram for malignant neoplasm of breast: Secondary | ICD-10-CM | POA: Diagnosis not present

## 2022-01-17 DIAGNOSIS — Z780 Asymptomatic menopausal state: Secondary | ICD-10-CM | POA: Diagnosis not present

## 2022-01-17 DIAGNOSIS — M8580 Other specified disorders of bone density and structure, unspecified site: Secondary | ICD-10-CM

## 2022-01-22 DIAGNOSIS — R2689 Other abnormalities of gait and mobility: Secondary | ICD-10-CM | POA: Diagnosis not present

## 2022-01-22 DIAGNOSIS — N179 Acute kidney failure, unspecified: Secondary | ICD-10-CM | POA: Diagnosis not present

## 2022-01-23 DIAGNOSIS — F411 Generalized anxiety disorder: Secondary | ICD-10-CM | POA: Diagnosis not present

## 2022-01-23 DIAGNOSIS — M810 Age-related osteoporosis without current pathological fracture: Secondary | ICD-10-CM | POA: Diagnosis not present

## 2022-01-23 DIAGNOSIS — N179 Acute kidney failure, unspecified: Secondary | ICD-10-CM | POA: Diagnosis not present

## 2022-01-23 DIAGNOSIS — E785 Hyperlipidemia, unspecified: Secondary | ICD-10-CM | POA: Diagnosis not present

## 2022-01-23 DIAGNOSIS — R110 Nausea: Secondary | ICD-10-CM | POA: Diagnosis not present

## 2022-01-23 DIAGNOSIS — I10 Essential (primary) hypertension: Secondary | ICD-10-CM | POA: Diagnosis not present

## 2022-01-23 DIAGNOSIS — K219 Gastro-esophageal reflux disease without esophagitis: Secondary | ICD-10-CM | POA: Diagnosis not present

## 2022-01-23 DIAGNOSIS — A0472 Enterocolitis due to Clostridium difficile, not specified as recurrent: Secondary | ICD-10-CM | POA: Diagnosis not present

## 2022-01-23 DIAGNOSIS — K5100 Ulcerative (chronic) pancolitis without complications: Secondary | ICD-10-CM | POA: Diagnosis not present

## 2022-01-24 DIAGNOSIS — R2689 Other abnormalities of gait and mobility: Secondary | ICD-10-CM | POA: Diagnosis not present

## 2022-01-24 DIAGNOSIS — N179 Acute kidney failure, unspecified: Secondary | ICD-10-CM | POA: Diagnosis not present

## 2022-01-29 DIAGNOSIS — R2689 Other abnormalities of gait and mobility: Secondary | ICD-10-CM | POA: Diagnosis not present

## 2022-01-29 DIAGNOSIS — N179 Acute kidney failure, unspecified: Secondary | ICD-10-CM | POA: Diagnosis not present

## 2022-02-01 DIAGNOSIS — R2689 Other abnormalities of gait and mobility: Secondary | ICD-10-CM | POA: Diagnosis not present

## 2022-02-01 DIAGNOSIS — N179 Acute kidney failure, unspecified: Secondary | ICD-10-CM | POA: Diagnosis not present

## 2022-02-06 DIAGNOSIS — R2689 Other abnormalities of gait and mobility: Secondary | ICD-10-CM | POA: Diagnosis not present

## 2022-02-06 DIAGNOSIS — K5100 Ulcerative (chronic) pancolitis without complications: Secondary | ICD-10-CM | POA: Insufficient documentation

## 2022-02-06 DIAGNOSIS — N179 Acute kidney failure, unspecified: Secondary | ICD-10-CM | POA: Diagnosis not present

## 2022-02-06 DIAGNOSIS — G8191 Hemiplegia, unspecified affecting right dominant side: Secondary | ICD-10-CM | POA: Insufficient documentation

## 2022-02-07 ENCOUNTER — Encounter: Payer: Self-pay | Admitting: Nurse Practitioner

## 2022-02-07 ENCOUNTER — Ambulatory Visit (INDEPENDENT_AMBULATORY_CARE_PROVIDER_SITE_OTHER): Payer: Medicare HMO | Admitting: Nurse Practitioner

## 2022-02-07 ENCOUNTER — Other Ambulatory Visit (INDEPENDENT_AMBULATORY_CARE_PROVIDER_SITE_OTHER): Payer: Medicare HMO

## 2022-02-07 VITALS — BP 122/70 | HR 85 | Ht 62.0 in | Wt 198.0 lb

## 2022-02-07 DIAGNOSIS — D649 Anemia, unspecified: Secondary | ICD-10-CM | POA: Diagnosis not present

## 2022-02-07 DIAGNOSIS — A0472 Enterocolitis due to Clostridium difficile, not specified as recurrent: Secondary | ICD-10-CM

## 2022-02-07 DIAGNOSIS — N179 Acute kidney failure, unspecified: Secondary | ICD-10-CM | POA: Diagnosis not present

## 2022-02-07 DIAGNOSIS — R2689 Other abnormalities of gait and mobility: Secondary | ICD-10-CM | POA: Diagnosis not present

## 2022-02-07 LAB — CBC
HCT: 38.2 % (ref 36.0–46.0)
Hemoglobin: 12.3 g/dL (ref 12.0–15.0)
MCHC: 32.3 g/dL (ref 30.0–36.0)
MCV: 90.2 fl (ref 78.0–100.0)
Platelets: 275 10*3/uL (ref 150.0–400.0)
RBC: 4.23 Mil/uL (ref 3.87–5.11)
RDW: 14.4 % (ref 11.5–15.5)
WBC: 7 10*3/uL (ref 4.0–10.5)

## 2022-02-07 NOTE — Progress Notes (Signed)
? ? ? ?02/07/2022 ?Courtney POESCHEL ?[id number] ?06-07-1946 ? ? ?CHIEF COMPLAINT: Colitis, hospital follow up ? ?HISTORY OF PRESENT ILLNESS: Courtney [name] is a 76 year old female with a past medical history of bipolar disorder, aortic atherosclerosis, hypertension, 3 vessel coronary calcifications per CTA 10/2021, MVP, CVA, PE, OSA, Covid 19, hypothyroidism, kidney stones, anemia, GERD, diverticulitis and C. Diff colitis 11/2021.  ? ?She was admitted to the hospital 11/18/2021 with Covid 19 infection with N/V, diarrhea, bright red blood per the rectum and AKI. An abdominal/pelvic CTA showed diffuse mural thickening, mucosal hyperenhancement and hypervascularity of the associated mesorectum/mesocolon indicative of proctocolitis which extended  to the level of the proximal transverse colon and colonic diverticulosis with focal inflammatory changes noted and were adjacent to the descending colon, acute diverticulitis could not be entirely excluded. Likely Covid associated colitis. C. Diff PCR and GI pathogen panel results were negative. She received Rocephin and Flagul IV. She was seen by our inpatient GI service on 11/22/2021, and it was thought she likely had gastroenteritis or Covid related colitis. IV antibiotics were continued with eventual transition to oral antibiotics for possible diverticulitis. She was discharged home 11/24/2021 with recommendations to to follow up in our office as an outpatient to discuss scheduling a future colonoscopy.  ? ?She was readmitted to the hospital 2/16 - 12/18/2021 with recurrent diarrhea, AKI, hypomagnesemia and C. Diff colitis. Repeat CTAP showed pancolitis and C. Diff antigen and toxin tests were positive. She was started on oral Vancomycin 12/10/2021 and Flagyl IV. Her AKI resolved with IV fluid resuscitation and her diarrhea improved. She as discharged to [facility] 12/18/2021 on Vancomycin 125mg  po qid and Flagyl 500mg  po tid to complete a 14 day course. She was also prescribed Florastor  bid. Her diarrhea abated within 1 or 2 days later without recurrence.  ? ?She was re-admitted to the hospital 01/02/2022 due to having chest pain. Troponin wnl, ECG without acute ischemic changes. Subsequent Lexiscan stress test without evidence of reversible ischemia, a small fixed defect in the apical segment of the inferolateral wall suggesting a small infarction was noted. LV EF 71%. Low risk study.  Her chest pain was thought to be noncardiac and likely musculoskeletal.  ? ?She was also found to have Pseudomonas UTI treated with Fosfomycin 3gm x 1 on 01/03/2022. ? ?She presents to our office today for GI follow as recommended following her hospital admission 11/18/2021 as noted above. She denies having any further diarrhea since she was discharged from rehab. She is passing a normal formed brown stool once daily. No rectal bleeding or black stools. Her energy level and overall strength are slowly improving. Her appetite has improved. Her husband is present.  ? ?She reported undergoing 3 colonoscopies in her life time. The first colonoscopy at age 32 was reported as normal. Her second colonoscopy was around age 74 and was aborted because she stopped breathing during this procedure. Her third and most recent colonoscopy was done by Dr. Chapman Fitch at [facility] in 2017 which she reported was normal and she did not experience any problems with the sedation at that time. She underwent an EGD at the time of her colonoscopy in 2017, no significant findings. She is on Pantoprazole 40mg  QD. No GERD symptoms.  ? ? ?  Latest Ref Rng & Units 02/07/2022  ?  4:14 PM 01/03/2022  ?  3:13 AM 01/02/2022  ? 10:27 AM  ?CBC  ?WBC 4.0 - 10.5 K/uL 7.0   6.9  9.2    ?Hemoglobin 12.0 - 15.0 g/dL 12.3   10.5   11.0    ?Hematocrit 36.0 - 46.0 % 38.2   33.8   35.8    ?Platelets 150.0 - 400.0 K/uL 275.0   308   339    ?  ? ?  Latest Ref Rng & Units 01/03/2022  ?  3:13 AM 01/02/2022  ? 10:27 AM 12/18/2021  ?  3:33 AM  ?CMP  ?Glucose 70  - 99 mg/dL 96   91   88    ?BUN 8 - 23 mg/dL 7   5   <5    ?Creatinine 0.44 - 1.00 mg/dL 0.88   0.59   0.77    ?Sodium 135 - 145 mmol/L 137   138   141    ?Potassium 3.5 - 5.1 mmol/L 3.6   3.7   4.0    ?Chloride 98 - 111 mmol/L 102   103   101    ?CO2 22 - 32 mmol/L 27   24   35    ?Calcium 8.9 - 10.3 mg/dL 8.8   9.0   8.7    ?Total Protein 6.5 - 8.1 g/dL 5.5      ?Total Bilirubin 0.3 - 1.2 mg/dL 0.1      ?Alkaline Phos 38 - 126 U/L 75      ?AST 15 - 41 U/L 31      ?ALT 0 - 44 U/L 25      ?  ?BMP done by [facility] PCP 01/23/2022: Na+ 139. K+ 4.6. BUN 13. Cr. 0.68. ? ?ECHO 01/10/2022: ?Left ventricular ejection fraction, by estimation, is 60 to 65%. The left ventricle has ?normal function. The left ventricle has no regional wall motion abnormalities. There is ?moderate asymmetric left ventricular hypertrophy of the basal-septal segment. Left ?ventricular diastolic parameters are consistent with Grade I diastolic dysfunction ?(impaired relaxation). ?1. ?Right ventricular systolic function is normal. The right ventricular size is normal. ?There is normal pulmonary artery systolic pressure. The estimated right ventricular ?systolic pressure is [id number] mmHg. ?2. ?3. The mitral valve is normal in structure. Trivial mitral valve regurgitation. ?The aortic valve is tricuspid. Aortic valve regurgitation is not visualized. Aortic valve ?sclerosis/calcification is present, without any evidence of aortic stenosis ? ? ?Past Medical History:  ?Diagnosis Date  ? Anemia   ? Aortic atherosclerosis ([place])   ? Arthritis   ? Bilateral Wrists  ? Asthma   ? Bipolar disorder ([place])   ? Coronary artery calcification   ? Diverticulitis   ? Hiatal hernia   ? History of kidney stones 2006  ? Hypertension   ? Hypothyroidism (acquired) 04/02/2021  ? Kidney stone   ? Mitral valve prolapse   ? OSA (obstructive sleep apnea) 04/02/2021  ? Panic attack   ? PONV (postoperative nausea and vomiting)   ? Pulmonary embolus ([place])   ? Stroke [facility])   ? Thrombophlebitis    ? Vertigo   ? ?Past Surgical History:  ?Procedure Laterality Date  ? ABDOMINAL HYSTERECTOMY    ? ANTERIOR CERVICAL DECOMP/DISCECTOMY FUSION N/A 03/28/2021  ? Procedure: Cervical three-four Cervical four-five Cervical five-six Anterior cervical decompression/discectomy/fusion/interbody prosthesis/plate/screws;  Surgeon: Newman Pies, MD;  Location: [facility];  Service: Neurosurgery;  Laterality: N/A;  ? BACK SURGERY    ? BREAST REDUCTION SURGERY    ? CHOLECYSTECTOMY    ? EYE SURGERY Bilateral 2000  ? Cataract  ? JOINT REPLACEMENT Bilateral   ? Knee replacement  ? KNEE SURGERY    ?  KNEE SURGERY    ? REDUCTION MAMMAPLASTY    ? TOE SURGERY    ? TONSILLECTOMY    ? ?Social History: She is married. Nonsmoker. Infrequent alcohol use.  ? ?Family History: family history includes Breast cancer in her paternal aunt; Esophageal cancer in her maternal uncle; Hyperlipidemia in her father; Stroke in her father. No family history of colorectal cancer.  ? ?Allergies  ?Allergen Reactions  ? Tetanus-Diphtheria Toxoids Td Anaphylaxis  ? Meloxicam Nausea And Vomiting  ? Phentermine Nausea And Vomiting  ? Sulfa Antibiotics Diarrhea and Nausea And Vomiting  ? Zithromax [Azithromycin] Diarrhea and Nausea And Vomiting  ? ? ?  ?Outpatient Encounter Medications as of 02/07/2022  ?Medication Sig  ? acetaminophen (TYLENOL) 325 MG tablet Take 650 mg by mouth every 6 (six) hours as needed for mild pain or fever.  ? albuterol (VENTOLIN HFA) 108 (90 Base) MCG/ACT inhaler Inhale 1 puff into the lungs every 6 (six) hours as needed for wheezing or shortness of breath.  ? alendronate (FOSAMAX) 70 MG tablet Take 70 mg by mouth once a week. Take with a full glass of water on an empty stomach.  ? aspirin 325 MG tablet Take 325 mg by mouth daily.  ? atorvastatin (LIPITOR) 80 MG tablet Take 80 mg by mouth every evening.  ? benzonatate (TESSALON) 200 MG capsule Take 200 mg by mouth every 8 (eight) hours as needed for cough.  ? Biotin 5000 MCG CAPS Take 5,000  mcg by mouth daily.  ? buPROPion (WELLBUTRIN XL) 150 MG 24 hr tablet Take 150 mg by mouth daily.  ? Cholecalciferol (VITAMIN D3) 25 MCG (1000 UT) CAPS Take 1,000 Units by mouth daily.  ? cyanocobalami

## 2022-02-07 NOTE — Patient Instructions (Addendum)
Please proceed to the basement level for lab work before leaving today. Press "B" on the elevator. The lab is located at the first door on the left as you exit the elevator. ? ?HEALTHCARE LAWS AND MY CHART RESULTS:  ? ?Due to recent changes in healthcare laws, you may see results of your imaging and/or laboratory studies on MyChart before I have had a chance to review them.  I understand that in some cases there may be results that are confusing or concerning to you. Please understand that not all results are received at the same time and often I may need to interpret multiple results in order to provide you with the best plan of care or course of treatment. Therefore, I ask that you please give me 48 hours to thoroughly review all your results before contacting my office for clarification.  ? ?Follow up with Dr. Leonides Schanz in 3 months. ? ?Contact us if diarrhea recurs. ? ?Thank you for trusting me with your gastrointestinal care!   ? ?Arnaldo Natal, CRNP ? ? ? ?BMI: ? ?If you are age 74 or older, your body mass index should be between 23-30. Your Body mass index is 36.21 kg/m?Marland Kitchen If this is out of the aforementioned range listed, please consider follow up with your Primary Care Provider. ? ?If you are age 24 or younger, your body mass index should be between 19-25. Your Body mass index is 36.21 kg/m?Marland Kitchen If this is out of the aformentioned range listed, please consider follow up with your Primary Care Provider.  ? ?MY CHART: ? ?The [facility] providers would like to encourage you to use [facility] to communicate with providers for non-urgent requests or questions.  Due to long hold times on the telephone, sending your provider a message by [facility] may be a faster and more efficient way to get a response.  Please allow 48 business hours for a response.  Please remember that this is for non-urgent requests.  ? ?

## 2022-02-09 NOTE — Progress Notes (Signed)
I agree with the assessment and plan as outlined by Ms. Kennedy-Smith. 

## 2022-02-20 DIAGNOSIS — R351 Nocturia: Secondary | ICD-10-CM | POA: Diagnosis not present

## 2022-02-20 DIAGNOSIS — N3941 Urge incontinence: Secondary | ICD-10-CM | POA: Diagnosis not present

## 2022-03-14 DIAGNOSIS — I10 Essential (primary) hypertension: Secondary | ICD-10-CM | POA: Diagnosis not present

## 2022-03-14 DIAGNOSIS — I700 Atherosclerosis of aorta: Secondary | ICD-10-CM | POA: Diagnosis not present

## 2022-03-14 DIAGNOSIS — Z8673 Personal history of transient ischemic attack (TIA), and cerebral infarction without residual deficits: Secondary | ICD-10-CM | POA: Diagnosis not present

## 2022-03-14 DIAGNOSIS — E039 Hypothyroidism, unspecified: Secondary | ICD-10-CM | POA: Diagnosis not present

## 2022-03-14 DIAGNOSIS — F331 Major depressive disorder, recurrent, moderate: Secondary | ICD-10-CM | POA: Diagnosis not present

## 2022-03-14 DIAGNOSIS — M5136 Other intervertebral disc degeneration, lumbar region: Secondary | ICD-10-CM | POA: Diagnosis not present

## 2022-03-14 DIAGNOSIS — R7303 Prediabetes: Secondary | ICD-10-CM | POA: Diagnosis not present

## 2022-03-14 DIAGNOSIS — G8190 Hemiplegia, unspecified affecting unspecified side: Secondary | ICD-10-CM | POA: Diagnosis not present

## 2022-03-14 DIAGNOSIS — Z1331 Encounter for screening for depression: Secondary | ICD-10-CM | POA: Diagnosis not present

## 2022-03-14 DIAGNOSIS — Z0000 Encounter for general adult medical examination without abnormal findings: Secondary | ICD-10-CM | POA: Diagnosis not present

## 2022-03-14 DIAGNOSIS — M810 Age-related osteoporosis without current pathological fracture: Secondary | ICD-10-CM | POA: Diagnosis not present

## 2022-03-14 DIAGNOSIS — I639 Cerebral infarction, unspecified: Secondary | ICD-10-CM | POA: Diagnosis not present

## 2022-03-26 DIAGNOSIS — Z6835 Body mass index (BMI) 35.0-35.9, adult: Secondary | ICD-10-CM | POA: Diagnosis not present

## 2022-03-26 DIAGNOSIS — M542 Cervicalgia: Secondary | ICD-10-CM | POA: Diagnosis not present

## 2022-03-26 DIAGNOSIS — M5416 Radiculopathy, lumbar region: Secondary | ICD-10-CM | POA: Diagnosis not present

## 2022-04-02 DIAGNOSIS — L821 Other seborrheic keratosis: Secondary | ICD-10-CM | POA: Diagnosis not present

## 2022-04-02 DIAGNOSIS — L820 Inflamed seborrheic keratosis: Secondary | ICD-10-CM | POA: Diagnosis not present

## 2022-04-04 DIAGNOSIS — M47816 Spondylosis without myelopathy or radiculopathy, lumbar region: Secondary | ICD-10-CM | POA: Diagnosis not present

## 2022-04-04 DIAGNOSIS — M5416 Radiculopathy, lumbar region: Secondary | ICD-10-CM | POA: Diagnosis not present

## 2022-04-05 DIAGNOSIS — N3941 Urge incontinence: Secondary | ICD-10-CM | POA: Diagnosis not present

## 2022-04-16 DIAGNOSIS — M5416 Radiculopathy, lumbar region: Secondary | ICD-10-CM | POA: Diagnosis not present

## 2022-04-16 DIAGNOSIS — M545 Low back pain, unspecified: Secondary | ICD-10-CM | POA: Diagnosis not present

## 2022-05-01 ENCOUNTER — Other Ambulatory Visit: Payer: Self-pay | Admitting: Student

## 2022-05-01 DIAGNOSIS — M5416 Radiculopathy, lumbar region: Secondary | ICD-10-CM

## 2022-05-03 ENCOUNTER — Ambulatory Visit
Admission: RE | Admit: 2022-05-03 | Discharge: 2022-05-03 | Disposition: A | Discharge status: Home / Self Care | Payer: Medicare HMO | Source: Ambulatory Visit | Attending: Student | Admitting: Student

## 2022-05-03 DIAGNOSIS — M4186 Other forms of scoliosis, lumbar region: Secondary | ICD-10-CM | POA: Diagnosis not present

## 2022-05-03 DIAGNOSIS — M5416 Radiculopathy, lumbar region: Secondary | ICD-10-CM

## 2022-05-03 DIAGNOSIS — M4326 Fusion of spine, lumbar region: Secondary | ICD-10-CM | POA: Diagnosis not present

## 2022-05-03 DIAGNOSIS — M48061 Spinal stenosis, lumbar region without neurogenic claudication: Secondary | ICD-10-CM | POA: Diagnosis not present

## 2022-05-03 MED ORDER — IOPAMIDOL (ISOVUE-M 300) INJECTION 61%
15.0000 mL | Freq: Once | INTRAMUSCULAR | Status: AC | PRN
  Administered 2022-05-03: 15 mL via INTRATHECAL

## 2022-05-03 MED ORDER — DIAZEPAM 5 MG PO TABS: 5.0000 mg | ORAL_TABLET | Freq: Once | ORAL | Status: DC

## 2022-05-03 MED ORDER — ONDANSETRON HCL 4 MG/2ML IJ SOLN: 4.0000 mg | Freq: Once | INTRAMUSCULAR | Status: DC | PRN

## 2022-05-03 MED ORDER — MEPERIDINE HCL 50 MG/ML IJ SOLN: 50.0000 mg | Freq: Once | INTRAMUSCULAR | Status: DC | PRN

## 2022-05-03 NOTE — Discharge Instructions (Addendum)

## 2022-05-28 DIAGNOSIS — I639 Cerebral infarction, unspecified: Secondary | ICD-10-CM | POA: Diagnosis not present

## 2022-05-28 DIAGNOSIS — G4733 Obstructive sleep apnea (adult) (pediatric): Secondary | ICD-10-CM | POA: Diagnosis not present

## 2022-05-28 DIAGNOSIS — I10 Essential (primary) hypertension: Secondary | ICD-10-CM | POA: Diagnosis not present

## 2022-05-30 DIAGNOSIS — M5136 Other intervertebral disc degeneration, lumbar region: Secondary | ICD-10-CM | POA: Diagnosis not present

## 2022-05-30 DIAGNOSIS — Z6836 Body mass index (BMI) 36.0-36.9, adult: Secondary | ICD-10-CM | POA: Diagnosis not present

## 2022-08-07 DIAGNOSIS — N3941 Urge incontinence: Secondary | ICD-10-CM | POA: Diagnosis not present

## 2022-08-13 NOTE — Telephone Encounter (Signed)
Remo Lipps, pls have patient contact her PCP regarding her request form Fosamax (Alendronate) refill as this medication is for osteoporosis and not for a GI condition. THX.

## 2022-09-05 DIAGNOSIS — I639 Cerebral infarction, unspecified: Secondary | ICD-10-CM | POA: Diagnosis not present

## 2022-09-05 DIAGNOSIS — I10 Essential (primary) hypertension: Secondary | ICD-10-CM | POA: Diagnosis not present

## 2022-09-05 DIAGNOSIS — G4733 Obstructive sleep apnea (adult) (pediatric): Secondary | ICD-10-CM | POA: Diagnosis not present

## 2022-09-19 DIAGNOSIS — I10 Essential (primary) hypertension: Secondary | ICD-10-CM | POA: Diagnosis not present

## 2022-09-19 DIAGNOSIS — M4802 Spinal stenosis, cervical region: Secondary | ICD-10-CM | POA: Diagnosis not present

## 2022-09-19 DIAGNOSIS — F331 Major depressive disorder, recurrent, moderate: Secondary | ICD-10-CM | POA: Diagnosis not present

## 2022-09-19 DIAGNOSIS — E785 Hyperlipidemia, unspecified: Secondary | ICD-10-CM | POA: Diagnosis not present

## 2022-09-19 DIAGNOSIS — E039 Hypothyroidism, unspecified: Secondary | ICD-10-CM | POA: Diagnosis not present

## 2022-09-19 DIAGNOSIS — J45909 Unspecified asthma, uncomplicated: Secondary | ICD-10-CM | POA: Diagnosis not present

## 2022-09-19 DIAGNOSIS — G629 Polyneuropathy, unspecified: Secondary | ICD-10-CM | POA: Diagnosis not present

## 2022-09-19 DIAGNOSIS — G8190 Hemiplegia, unspecified affecting unspecified side: Secondary | ICD-10-CM | POA: Diagnosis not present

## 2022-09-19 DIAGNOSIS — K219 Gastro-esophageal reflux disease without esophagitis: Secondary | ICD-10-CM | POA: Diagnosis not present

## 2022-09-27 DIAGNOSIS — R200 Anesthesia of skin: Secondary | ICD-10-CM | POA: Diagnosis not present

## 2022-09-27 DIAGNOSIS — G5601 Carpal tunnel syndrome, right upper limb: Secondary | ICD-10-CM | POA: Diagnosis not present

## 2022-09-27 DIAGNOSIS — M5136 Other intervertebral disc degeneration, lumbar region: Secondary | ICD-10-CM | POA: Diagnosis not present

## 2022-09-27 DIAGNOSIS — M48062 Spinal stenosis, lumbar region with neurogenic claudication: Secondary | ICD-10-CM | POA: Diagnosis not present

## 2022-10-05 DIAGNOSIS — J0140 Acute pansinusitis, unspecified: Secondary | ICD-10-CM | POA: Diagnosis not present

## 2022-10-31 DIAGNOSIS — F331 Major depressive disorder, recurrent, moderate: Secondary | ICD-10-CM | POA: Diagnosis not present

## 2022-10-31 DIAGNOSIS — F411 Generalized anxiety disorder: Secondary | ICD-10-CM | POA: Diagnosis not present

## 2022-10-31 DIAGNOSIS — R0981 Nasal congestion: Secondary | ICD-10-CM | POA: Diagnosis not present

## 2022-12-11 DIAGNOSIS — R600 Localized edema: Secondary | ICD-10-CM | POA: Diagnosis not present

## 2022-12-11 DIAGNOSIS — I519 Heart disease, unspecified: Secondary | ICD-10-CM | POA: Diagnosis not present

## 2022-12-11 DIAGNOSIS — J309 Allergic rhinitis, unspecified: Secondary | ICD-10-CM | POA: Diagnosis not present

## 2022-12-11 DIAGNOSIS — R0981 Nasal congestion: Secondary | ICD-10-CM | POA: Diagnosis not present

## 2022-12-11 DIAGNOSIS — J45909 Unspecified asthma, uncomplicated: Secondary | ICD-10-CM | POA: Diagnosis not present

## 2022-12-16 DIAGNOSIS — Z961 Presence of intraocular lens: Secondary | ICD-10-CM | POA: Diagnosis not present

## 2022-12-16 DIAGNOSIS — H353131 Nonexudative age-related macular degeneration, bilateral, early dry stage: Secondary | ICD-10-CM | POA: Diagnosis not present

## 2022-12-17 ENCOUNTER — Other Ambulatory Visit: Payer: Self-pay | Admitting: Internal Medicine

## 2022-12-17 DIAGNOSIS — Z1231 Encounter for screening mammogram for malignant neoplasm of breast: Secondary | ICD-10-CM

## 2022-12-19 DIAGNOSIS — Z6841 Body Mass Index (BMI) 40.0 and over, adult: Secondary | ICD-10-CM | POA: Diagnosis not present

## 2022-12-19 DIAGNOSIS — R609 Edema, unspecified: Secondary | ICD-10-CM | POA: Diagnosis not present

## 2022-12-19 DIAGNOSIS — I700 Atherosclerosis of aorta: Secondary | ICD-10-CM | POA: Diagnosis not present

## 2023-01-10 DIAGNOSIS — I5032 Chronic diastolic (congestive) heart failure: Secondary | ICD-10-CM | POA: Diagnosis not present

## 2023-01-10 DIAGNOSIS — R600 Localized edema: Secondary | ICD-10-CM | POA: Diagnosis not present

## 2023-01-10 DIAGNOSIS — I110 Hypertensive heart disease with heart failure: Secondary | ICD-10-CM | POA: Diagnosis not present

## 2023-01-24 DIAGNOSIS — J300 Vasomotor rhinitis: Secondary | ICD-10-CM | POA: Diagnosis not present

## 2023-01-24 DIAGNOSIS — J3089 Other allergic rhinitis: Secondary | ICD-10-CM | POA: Diagnosis not present

## 2023-01-24 DIAGNOSIS — J329 Chronic sinusitis, unspecified: Secondary | ICD-10-CM | POA: Diagnosis not present

## 2023-01-24 DIAGNOSIS — J4530 Mild persistent asthma, uncomplicated: Secondary | ICD-10-CM | POA: Diagnosis not present

## 2023-01-29 ENCOUNTER — Ambulatory Visit
Admission: RE | Admit: 2023-01-29 | Discharge: 2023-01-29 | Disposition: A | Discharge status: Home / Self Care | Payer: Medicare HMO | Source: Ambulatory Visit | Attending: Internal Medicine | Admitting: Internal Medicine

## 2023-01-29 DIAGNOSIS — Z1231 Encounter for screening mammogram for malignant neoplasm of breast: Secondary | ICD-10-CM

## 2023-02-12 DIAGNOSIS — E039 Hypothyroidism, unspecified: Secondary | ICD-10-CM | POA: Diagnosis not present

## 2023-02-12 DIAGNOSIS — J4540 Moderate persistent asthma, uncomplicated: Secondary | ICD-10-CM | POA: Diagnosis not present

## 2023-02-12 DIAGNOSIS — Z9989 Dependence on other enabling machines and devices: Secondary | ICD-10-CM | POA: Diagnosis not present

## 2023-02-12 DIAGNOSIS — I5030 Unspecified diastolic (congestive) heart failure: Secondary | ICD-10-CM | POA: Diagnosis not present

## 2023-02-12 DIAGNOSIS — G8191 Hemiplegia, unspecified affecting right dominant side: Secondary | ICD-10-CM | POA: Diagnosis not present

## 2023-02-12 DIAGNOSIS — G4733 Obstructive sleep apnea (adult) (pediatric): Secondary | ICD-10-CM | POA: Diagnosis not present

## 2023-02-12 DIAGNOSIS — I110 Hypertensive heart disease with heart failure: Secondary | ICD-10-CM | POA: Diagnosis not present

## 2023-02-28 DIAGNOSIS — R200 Anesthesia of skin: Secondary | ICD-10-CM | POA: Diagnosis not present

## 2023-02-28 DIAGNOSIS — M4712 Other spondylosis with myelopathy, cervical region: Secondary | ICD-10-CM | POA: Diagnosis not present

## 2023-03-06 DIAGNOSIS — G4733 Obstructive sleep apnea (adult) (pediatric): Secondary | ICD-10-CM | POA: Diagnosis not present

## 2023-03-06 DIAGNOSIS — I639 Cerebral infarction, unspecified: Secondary | ICD-10-CM | POA: Diagnosis not present

## 2023-03-18 DIAGNOSIS — E785 Hyperlipidemia, unspecified: Secondary | ICD-10-CM | POA: Diagnosis not present

## 2023-03-18 DIAGNOSIS — I700 Atherosclerosis of aorta: Secondary | ICD-10-CM | POA: Diagnosis not present

## 2023-03-18 DIAGNOSIS — I69351 Hemiplegia and hemiparesis following cerebral infarction affecting right dominant side: Secondary | ICD-10-CM | POA: Diagnosis not present

## 2023-03-18 DIAGNOSIS — Z6841 Body Mass Index (BMI) 40.0 and over, adult: Secondary | ICD-10-CM | POA: Diagnosis not present

## 2023-03-18 DIAGNOSIS — R7303 Prediabetes: Secondary | ICD-10-CM | POA: Diagnosis not present

## 2023-03-18 DIAGNOSIS — I10 Essential (primary) hypertension: Secondary | ICD-10-CM | POA: Diagnosis not present

## 2023-03-18 DIAGNOSIS — Z1331 Encounter for screening for depression: Secondary | ICD-10-CM | POA: Diagnosis not present

## 2023-03-18 DIAGNOSIS — E538 Deficiency of other specified B group vitamins: Secondary | ICD-10-CM | POA: Diagnosis not present

## 2023-03-18 DIAGNOSIS — Z0000 Encounter for general adult medical examination without abnormal findings: Secondary | ICD-10-CM | POA: Diagnosis not present

## 2023-03-18 DIAGNOSIS — F331 Major depressive disorder, recurrent, moderate: Secondary | ICD-10-CM | POA: Diagnosis not present

## 2023-03-24 DIAGNOSIS — G5621 Lesion of ulnar nerve, right upper limb: Secondary | ICD-10-CM | POA: Diagnosis not present

## 2023-03-24 DIAGNOSIS — G5601 Carpal tunnel syndrome, right upper limb: Secondary | ICD-10-CM | POA: Diagnosis not present

## 2023-04-01 DIAGNOSIS — J300 Vasomotor rhinitis: Secondary | ICD-10-CM | POA: Diagnosis not present

## 2023-04-01 DIAGNOSIS — K219 Gastro-esophageal reflux disease without esophagitis: Secondary | ICD-10-CM | POA: Diagnosis not present

## 2023-04-01 DIAGNOSIS — R0989 Other specified symptoms and signs involving the circulatory and respiratory systems: Secondary | ICD-10-CM | POA: Diagnosis not present

## 2023-04-23 ENCOUNTER — Emergency Department (HOSPITAL_COMMUNITY): Payer: Medicare HMO

## 2023-04-23 ENCOUNTER — Emergency Department (HOSPITAL_COMMUNITY)
Admission: EM | Admit: 2023-04-23 | Discharge: 2023-04-23 | Disposition: A | Discharge status: Home / Self Care | Payer: Medicare HMO | Attending: Emergency Medicine | Admitting: Emergency Medicine

## 2023-04-23 ENCOUNTER — Other Ambulatory Visit: Payer: Self-pay

## 2023-04-23 DIAGNOSIS — W182XXA Fall in (into) shower or empty bathtub, initial encounter: Secondary | ICD-10-CM | POA: Diagnosis not present

## 2023-04-23 DIAGNOSIS — Z7982 Long term (current) use of aspirin: Secondary | ICD-10-CM | POA: Insufficient documentation

## 2023-04-23 DIAGNOSIS — W19XXXA Unspecified fall, initial encounter: Secondary | ICD-10-CM | POA: Diagnosis not present

## 2023-04-23 DIAGNOSIS — N390 Urinary tract infection, site not specified: Secondary | ICD-10-CM | POA: Diagnosis not present

## 2023-04-23 DIAGNOSIS — S0990XA Unspecified injury of head, initial encounter: Secondary | ICD-10-CM

## 2023-04-23 DIAGNOSIS — I10 Essential (primary) hypertension: Secondary | ICD-10-CM | POA: Diagnosis not present

## 2023-04-23 DIAGNOSIS — S0003XA Contusion of scalp, initial encounter: Secondary | ICD-10-CM | POA: Insufficient documentation

## 2023-04-23 DIAGNOSIS — Y92002 Bathroom of unspecified non-institutional (private) residence single-family (private) house as the place of occurrence of the external cause: Secondary | ICD-10-CM | POA: Insufficient documentation

## 2023-04-23 DIAGNOSIS — M4187 Other forms of scoliosis, lumbosacral region: Secondary | ICD-10-CM | POA: Diagnosis not present

## 2023-04-23 DIAGNOSIS — M5136 Other intervertebral disc degeneration, lumbar region: Secondary | ICD-10-CM | POA: Diagnosis not present

## 2023-04-23 DIAGNOSIS — M549 Dorsalgia, unspecified: Secondary | ICD-10-CM | POA: Diagnosis not present

## 2023-04-23 DIAGNOSIS — I6782 Cerebral ischemia: Secondary | ICD-10-CM | POA: Diagnosis not present

## 2023-04-23 LAB — CBC WITH DIFFERENTIAL/PLATELET
Abs Immature Granulocytes: 0.03 10*3/uL (ref 0.00–0.07)
Basophils Absolute: 0.1 10*3/uL (ref 0.0–0.1)
Basophils Relative: 1 %
Eosinophils Absolute: 0.4 10*3/uL (ref 0.0–0.5)
Eosinophils Relative: 4 %
HCT: 38.3 % (ref 36.0–46.0)
Hemoglobin: 11.9 g/dL — ABNORMAL LOW (ref 12.0–15.0)
Immature Granulocytes: 0 %
Lymphocytes Relative: 27 %
Lymphs Abs: 2.5 10*3/uL (ref 0.7–4.0)
MCH: 28.3 pg (ref 26.0–34.0)
MCHC: 31.1 g/dL (ref 30.0–36.0)
MCV: 91.2 fL (ref 80.0–100.0)
Monocytes Absolute: 1.2 10*3/uL — ABNORMAL HIGH (ref 0.1–1.0)
Monocytes Relative: 13 %
Neutro Abs: 4.9 10*3/uL (ref 1.7–7.7)
Neutrophils Relative %: 55 %
Platelets: 264 10*3/uL (ref 150–400)
RBC: 4.2 MIL/uL (ref 3.87–5.11)
RDW: 13.2 % (ref 11.5–15.5)
WBC: 9.1 10*3/uL (ref 4.0–10.5)
nRBC: 0 % (ref 0.0–0.2)

## 2023-04-23 LAB — URINALYSIS, W/ REFLEX TO CULTURE (INFECTION SUSPECTED)
Bilirubin Urine: NEGATIVE
Glucose, UA: NEGATIVE mg/dL
Ketones, ur: NEGATIVE mg/dL
Nitrite: NEGATIVE
Protein, ur: NEGATIVE mg/dL
Specific Gravity, Urine: 1.005 (ref 1.005–1.030)
WBC, UA: >50 WBC/hpf (ref 0–5)
pH: 7 (ref 5.0–8.0)

## 2023-04-23 LAB — BASIC METABOLIC PANEL
Anion gap: 7 (ref 5–15)
BUN: 10 mg/dL (ref 8–23)
CO2: 27 mmol/L (ref 22–32)
Calcium: 8.9 mg/dL (ref 8.9–10.3)
Chloride: 102 mmol/L (ref 98–111)
Creatinine, Ser: 0.82 mg/dL (ref 0.44–1.00)
GFR, Estimated: >60 mL/min (ref >60)
Glucose, Bld: 85 mg/dL (ref 70–99)
Potassium: 4 mmol/L (ref 3.5–5.1)
Sodium: 136 mmol/L (ref 135–145)

## 2023-04-23 MED ORDER — SODIUM CHLORIDE 0.9 % IV SOLN
1.0000 g | Freq: Once | INTRAVENOUS | Status: AC
  Administered 2023-04-23: 1 g via INTRAVENOUS
  Filled 2023-04-23: qty 10

## 2023-04-23 MED ORDER — CEPHALEXIN 500 MG PO CAPS: 500.0000 mg | ORAL_CAPSULE | Freq: Four times a day (QID) | ORAL | 0 refills | Status: DC

## 2023-04-23 MED ORDER — VANCOMYCIN HCL 125 MG PO CAPS: 125.0000 mg | ORAL_CAPSULE | Freq: Four times a day (QID) | ORAL | 0 refills | Status: AC

## 2023-04-23 MED ORDER — ACETAMINOPHEN 500 MG PO TABS
1000.0000 mg | ORAL_TABLET | Freq: Once | ORAL | Status: AC
  Administered 2023-04-23: 1000 mg via ORAL
  Filled 2023-04-23: qty 2

## 2023-04-23 NOTE — ED Provider Notes (Signed)
[place] EMERGENCY DEPARTMENT AT [facility] Provider Note   CSN: [id number] Arrival date & time: 04/23/23  0827     History  Chief Complaint  Patient presents with   Courtney [name]    Courtney [name] is a 77 y.o. female.  77 year old female with prior medical history as detailed below presents from her facility for evaluation.  Patient arrives by EMS after fall.  She reports that she got up this morning and was going to the bathroom with her walker.  She had a sweatshirt folded onto her walker.  This fell onto the floor.  She tried to lean forward to pick up the sweatshirt when she lost her balance and fell and struck her head against the closet door frame.  She denies LOC.  She complains of mild pain to the posterior aspect of her scalp.  She also complains of mild pain to her low back.  She reports a history of low back pain.  She denies other injury related to the fall.  She also reports feeling somewhat fatigued x 3 to 4 days.  She reports that she feels like her urine may have an unusual odor.  She denies fever.  She denies chest pain or shortness of breath.  She denies nausea, vomiting, bowel movement change.  She reports normal p.o. intake.  The history is provided by the patient and medical records.       Home Medications Prior to Admission medications   Medication Sig Start Date End Date Taking? Authorizing Provider  acetaminophen (TYLENOL) 325 MG tablet Take 650 mg by mouth every 6 (six) hours as needed for mild pain or fever.    [provider]  albuterol (VENTOLIN HFA) 108 (90 Base) MCG/ACT inhaler Inhale 1 puff into the lungs every 6 (six) hours as needed for wheezing or shortness of breath. 02/22/21   [provider]  alendronate (FOSAMAX) 70 MG tablet Take 70 mg by mouth once a week. Take with a full glass of water on an empty stomach.    [provider]  aspirin 325 MG tablet Take 325 mg by mouth daily.    [provider]   atorvastatin (LIPITOR) 80 MG tablet Take 80 mg by mouth every evening. 02/21/21   [provider]  benzonatate (TESSALON) 200 MG capsule Take 200 mg by mouth every 8 (eight) hours as needed for cough. 12/06/21   [provider]  Biotin 5000 MCG CAPS Take 5,000 mcg by mouth daily.    [provider]  buPROPion (WELLBUTRIN XL) 150 MG 24 hr tablet Take 150 mg by mouth daily. 05/27/16   [provider]  Cholecalciferol (VITAMIN D3) 25 MCG (1000 UT) CAPS Take 1,000 Units by mouth daily.    [provider]  cyanocobalamin (,VITAMIN B-12,) 1000 MCG/ML injection Inject 1,000 mcg into the muscle every 30 (thirty) days. 02/23/21   [provider]  diclofenac Sodium (VOLTAREN) 1 % GEL Apply 2 g topically See admin instructions. Apply at bedtime to legs, knees, feet, and wrists 12/02/20   [provider]  escitalopram (LEXAPRO) 10 MG tablet Take 10 mg by mouth daily. 11/24/20   [provider]  FLOVENT HFA 44 MCG/ACT inhaler Inhale 2 puffs into the lungs in the morning and at bedtime. 02/22/21   [provider]  fluticasone (FLONASE) 50 MCG/ACT nasal spray Place 2 sprays into both nostrils in the morning and at bedtime. 10/29/21   [provider]  furosemide (LASIX) 20 MG  tablet Take 20 mg by mouth. 0.5  tablets po prn    [provider]  gabapentin (NEURONTIN) 100 MG capsule Take 100 mg by mouth in the morning, at noon, and at bedtime. 02/13/21   [provider]  levothyroxine (SYNTHROID) 50 MCG tablet Take 50 mcg by mouth at bedtime. 02/09/21   [provider]  LORazepam (ATIVAN) 1 MG tablet Take 1 tablet (1 mg total) by mouth at bedtime. 01/03/22   Tyrone Nine, MD  meclizine (ANTIVERT) 25 MG tablet Take 25 mg by mouth every 8 (eight) hours as needed for dizziness.    [provider]  melatonin 5 MG TABS Take 5 mg by mouth at bedtime.    [provider]  montelukast (SINGULAIR) 10 MG tablet  Take 10 mg by mouth at bedtime. 02/13/21   [provider]  multivitamin-lutein (OCUVITE-LUTEIN) CAPS capsule Take 1 capsule by mouth daily.    [provider]  olmesartan (BENICAR) 40 MG tablet Take 40 mg by mouth daily. 02/21/21   [provider]  ondansetron (ZOFRAN) 4 MG tablet Take 4 mg by mouth every 8 (eight) hours as needed for nausea or vomiting.    [provider]  pantoprazole (PROTONIX) 40 MG tablet Take 40 mg by mouth daily.    [provider]  PRESCRIPTION MEDICATION CPAP- At bedtime    [provider]  verapamil (CALAN-SR) 120 MG CR tablet Take 360 mg by mouth daily. 02/13/21   [provider]  Vibegron (GEMTESA) 75 MG TABS Take 75 mg by mouth at bedtime.    [provider]      Allergies    Tetanus-diphtheria toxoids td, Meloxicam, Phentermine, Sulfa antibiotics, and Zithromax [azithromycin]    Review of Systems   Review of Systems  All other systems reviewed and are negative.   Physical Exam Updated Vital Signs BP (!) 148/79 (BP Location: Right Arm)   Pulse 75   Temp 97.7 F (36.5 C) (Oral)   Resp (!) 21   SpO2 99%  Physical Exam Vitals and nursing note reviewed.  Constitutional:      General: She is not in acute distress.    Appearance: Normal appearance. She is well-developed.  HENT:     Head: Normocephalic.     Comments: Superficial contusion noted to posterior scalp.  No laceration, abrasion, bleeding. Eyes:     Conjunctiva/sclera: Conjunctivae normal.     Pupils: Pupils are equal, round, and reactive to light.  Neck:     Comments: No cervical spine tenderness.  No neck pain. Cardiovascular:     Rate and Rhythm: Normal rate and regular rhythm.     Heart sounds: Normal heart sounds.  Pulmonary:     Effort: Pulmonary effort is normal. No respiratory distress.     Breath sounds: Normal breath sounds.  Abdominal:     General: There is no distension.     Palpations: Abdomen is soft.      Tenderness: There is no abdominal tenderness.  Musculoskeletal:        General: No deformity. Normal range of motion.     Cervical back: Normal range of motion and neck supple.     Comments: Mild diffuse tenderness with palpation across the paraspinal musculature of the lumbar spine.  No significant midline tenderness.  No bony step-off.  Full active range of motion of the lumbar spine noted.  Skin:    General: Skin is warm and dry.  Neurological:  General: No focal deficit present.     Mental Status: She is alert and oriented to person, place, and time.     ED Results / Procedures / Treatments   Labs (all labs ordered are listed, but only abnormal results are displayed) Labs Reviewed  CBC WITH DIFFERENTIAL/PLATELET - Abnormal; Notable for the following components:      Result Value   Hemoglobin 11.9 (*)    Monocytes Absolute 1.2 (*)    All other components within normal limits  URINALYSIS, W/ REFLEX TO CULTURE (INFECTION SUSPECTED) - Abnormal; Notable for the following components:   APPearance CLOUDY (*)    Hgb urine dipstick SMALL (*)    Leukocytes,Ua LARGE (*)    Bacteria, UA FEW (*)    All other components within normal limits  URINE CULTURE  BASIC METABOLIC PANEL    EKG EKG Interpretation Date/Time:  Wednesday April 23 2023 08:36:28 EDT Ventricular Rate:  75 PR Interval:  180 QRS Duration:  124 QT Interval:  400 QTC Calculation: 447 R Axis:   43  Text Interpretation: Sinus rhythm Multiple ventricular premature complexes Nonspecific intraventricular conduction delay Confirmed by Kristine Royal [phone]) on 04/23/2023 8:52:49 AM  Radiology No results found.  Procedures Procedures    Medications Ordered in ED Medications  acetaminophen (TYLENOL) tablet 1,000 mg (has no administration in time range)    ED Course/ Medical Decision Making/ A&P                             Medical Decision Making Amount and/or Complexity of Data Reviewed Labs:  ordered. Radiology: ordered.  Risk OTC drugs. Prescription drug management.    Medical Screen Complete  This patient presented to the ED with complaint of fall, head injury, weakness.  This complaint involves an extensive number of treatment options. The initial differential diagnosis includes, but is not limited to, trauma related to fall, metabolic abnormality, and infection, etc.  This presentation is: Acute, Chronic, Self-Limited, Previously Undiagnosed, Uncertain Prognosis, Complicated, Systemic Symptoms, and Threat to Life/Bodily Function  Patient reports fall from standing.  She did strike her head.  She also complains of gradually progressive weakness x 2 to 3 days.  She also complains of foul smell to urine.  Workup demonstrates no evidence of significant traumatic injury related to the fall.    Patient does have evidence of likely UTI on workup.  Patient administered IV antibiotics x 1 here in the ED.  Patient offered admission.  She declined same.  She did feels comfortable with plan to discharge home.  Patient understands need to complete entire course of prescribed antibiotics.  Additionally, patient with significant history of C. difficile colitis in the past.  Patient is requesting a course of oral vancomycin to accompany her p.o. antibiotics.  She reports that when she is prescribed any antibiotic she is advised by ID to take oral vancomycin to help prevent recurrent C. difficile infection.  Patient comfortable at time of discharge.  Importance of close follow-up stressed.  Strict return precautions given and understood.  Additional history obtained:  Additional history obtained from Spouse External records from outside sources obtained and reviewed including prior ED visits and prior Inpatient records.    Lab Tests:  I ordered and personally interpreted labs.    Imaging Studies ordered:  I ordered imaging studies including CT head, plain films of lumbar  spine I independently visualized and interpreted obtained imaging which showed NAD I agree with  the radiologist interpretation.   Cardiac Monitoring:  The patient was maintained on a cardiac monitor.  I personally viewed and interpreted the cardiac monitor which showed an underlying rhythm of: NSR   Medicines ordered:  I ordered medication including Rocephin for UTI Reevaluation of the patient after these medicines showed that the patient: improved  Problem List / ED Course:  Fall, head injury, UTI   Reevaluation:  After the interventions noted above, I reevaluated the patient and found that they have: improved  Disposition:  After consideration of the diagnostic results and the patients response to treatment, I feel that the patent would benefit from close outpatient follow-up.          Final Clinical Impression(s) / ED Diagnoses Final diagnoses:  Fall, initial encounter  Injury of head, initial encounter  Urinary tract infection without hematuria, site unspecified    Rx / DC Orders ED Discharge Orders          Ordered    vancomycin (VANCOCIN) 125 MG capsule  4 times daily        04/23/23 1145    cephALEXin (KEFLEX) 500 MG capsule  4 times daily        04/23/23 1145              Wynetta Fines, MD 04/23/23 1701

## 2023-04-23 NOTE — Discharge Instructions (Signed)
Return for any problem.  ?

## 2023-04-23 NOTE — ED Triage Notes (Signed)
Patient BIB EMS from nursing facility. Patient states that she got up this morning and was in the doorway to her closet and she fell when her sweatshirt fell off her walker and she hit her head. She states that she did not have any LOC.   [facility] is the name of the facility

## 2023-04-25 LAB — URINE CULTURE: Culture: >=100000 — AB

## 2023-04-26 ENCOUNTER — Telehealth (HOSPITAL_BASED_OUTPATIENT_CLINIC_OR_DEPARTMENT_OTHER): Payer: Self-pay | Admitting: *Deleted

## 2023-04-26 NOTE — Telephone Encounter (Signed)
Post ED Visit - Positive Culture Follow-up  Culture report reviewed by antimicrobial stewardship pharmacist: [facility] []  Enzo Bi, Pharm.D. []  Celedonio Miyamoto, Pharm.D., BCPS AQ-ID []  Garvin Fila, Pharm.D., BCPS []  Georgina Pillion, Pharm.D., BCPS []  [place], [place].D., BCPS, AAHIVP []  Estella Husk, Pharm.D., BCPS, AAHIVP []  Lysle Pearl, PharmD, BCPS []  Phillips Climes, PharmD, BCPS []  Agapito Games, PharmD, BCPS []  Verlan Friends, PharmD []  Mervyn Gay, PharmD, BCPS [x]  Lutricia Feil, PharmD  [facility] []  Len Childs, PharmD []  Greer Pickerel, PharmD []  Adalberto Cole, PharmD []  Perlie Gold, Rph []  Lonell Face) Jean Rosenthal, PharmD []  Earl Many, PharmD []  Junita Push, PharmD []  Dorna Leitz, PharmD []  Terrilee Files, PharmD []  Lynann Beaver, PharmD []  Keturah Barre, PharmD []  Loralee Pacas, PharmD []  Bernadene Person, PharmD   Positive urine culture Treated with Cephalexin, organism sensitive to the same and no further patient follow-up is required at this time.  Patsey Berthold 04/26/2023, 2:35 PM

## 2023-05-07 DIAGNOSIS — L570 Actinic keratosis: Secondary | ICD-10-CM | POA: Diagnosis not present

## 2023-05-09 DIAGNOSIS — R269 Unspecified abnormalities of gait and mobility: Secondary | ICD-10-CM | POA: Diagnosis not present

## 2023-05-09 DIAGNOSIS — N390 Urinary tract infection, site not specified: Secondary | ICD-10-CM | POA: Diagnosis not present

## 2023-05-09 DIAGNOSIS — R531 Weakness: Secondary | ICD-10-CM | POA: Diagnosis not present

## 2023-05-21 ENCOUNTER — Other Ambulatory Visit: Payer: Self-pay | Admitting: Neurosurgery

## 2023-05-23 ENCOUNTER — Other Ambulatory Visit: Payer: Self-pay | Admitting: Neurosurgery

## 2023-05-26 NOTE — Progress Notes (Signed)
Surgical Instructions   Your procedure is scheduled on Wednesday, June 04, 2023. Report to [facility] Main Entrance "A" at 11:35 A.M., then check in with the Admitting office. Any questions or running late day of surgery: call [phone]  Questions prior to your surgery date: call [phone], Monday-Friday, 8am-4pm. If you experience any cold or flu symptoms such as cough, fever, chills, shortness of breath, etc. between now and your scheduled surgery, please notify us at the above number.     Remember:  Do not eat or drink after midnight the night before your surgery   Take these medicines the morning of surgery with A SIP OF WATER  azelastine (ASTELIN)  buPROPion (WELLBUTRIN SR)  escitalopram (LEXAPRO)  gabapentin (NEURONTIN)  ipratropium (ATROVENT) 0.03 % nasal spray  oxybutynin (DITROPAN-XL)  pantoprazole (PROTONIX)  TRELEGY ELLIPTA 100-62.5-25 MCG/ACT AEPB  verapamil (CALAN-SR)   May take these medicines IF NEEDED: albuterol (VENTOLIN HFA) 108 (90 Base) Please bring inhaler with you to the hospital meclizine (ANTIVERT)  promethazine (PHENERGAN)    One week prior to surgery, STOP taking any Aspirin (unless otherwise instructed by your surgeon) Aleve, Naproxen, Ibuprofen, Motrin, Advil, Goody's, BC's, all herbal medications, fish oil, and non-prescription vitamins.  This includes your diclofenac Sodium (VOLTAREN) .                      Do NOT Smoke (Tobacco/Vaping) for 24 hours prior to your procedure.  If you use a CPAP at night, you may bring your mask/headgear for your overnight stay.   You will be asked to remove any contacts, glasses, piercing's, hearing aid's, dentures/partials prior to surgery. Please bring cases for these items if needed.    Patients discharged the day of surgery will not be allowed to drive home, and someone needs to stay with them for 24 hours.  SURGICAL WAITING ROOM VISITATION Patients may have no more than 2 support people in the  waiting area - these visitors may rotate.   Pre-op nurse will coordinate an appropriate time for 1 ADULT support person, who may not rotate, to accompany patient in pre-op.  Children under the age of 52 must have an adult with them who is not the patient and must remain in the main waiting area with an adult.  If the patient needs to stay at the hospital during part of their recovery, the visitor guidelines for inpatient rooms apply.  Please refer to the [facility] website for the visitor guidelines for any additional information.   If you received a COVID test during your pre-op visit  it is requested that you wear a mask when out in public, stay away from anyone that may not be feeling well and notify your surgeon if you develop symptoms. If you have been in contact with anyone that has tested positive in the last 10 days please notify you surgeon.      Pre-operative CHG Bathing Instructions   You can play a key role in reducing the risk of infection after surgery. Your skin needs to be as free of germs as possible. You can reduce the number of germs on your skin by washing with CHG (chlorhexidine gluconate) soap before surgery. CHG is an antiseptic soap that kills germs and continues to kill germs even after washing.   DO NOT use if you have an allergy to chlorhexidine/CHG or antibacterial soaps. If your skin becomes reddened or irritated, stop using the CHG and notify one of our RNs at [phone].  TAKE A SHOWER THE NIGHT BEFORE SURGERY AND THE DAY OF SURGERY    Please keep in mind the following:  DO NOT shave, including legs and underarms, 48 hours prior to surgery.   You may shave your face before/day of surgery.  Place clean sheets on your bed the night before surgery Use a clean washcloth (not used since being washed) for each shower. DO NOT sleep with pet's night before surgery.  CHG Shower Instructions:  If you choose to wash your hair and private area, wash  first with your normal shampoo/soap.  After you use shampoo/soap, rinse your hair and body thoroughly to remove shampoo/soap residue.  Turn the water OFF and apply half the bottle of CHG soap to a CLEAN washcloth.  Apply CHG soap ONLY FROM YOUR NECK DOWN TO YOUR TOES (washing for 3-5 minutes)  DO NOT use CHG soap on face, private areas, open wounds, or sores.  Pay special attention to the area where your surgery is being performed.  If you are having back surgery, having someone wash your back for you may be helpful. Wait 2 minutes after CHG soap is applied, then you may rinse off the CHG soap.  Pat dry with a clean towel  Put on clean pajamas    Additional instructions for the day of surgery: DO NOT APPLY any lotions, deodorants or perfumes.   Do not wear jewelry or makeup Do not wear nail polish, gel polish, artificial nails, or any other type of covering on natural nails (fingers and toes) Do not bring valuables to the hospital. [facility] is not responsible for valuables/personal belongings. Put on clean/comfortable clothes.  Please brush your teeth.  Ask your nurse before applying any prescription medications to the skin.

## 2023-05-27 ENCOUNTER — Other Ambulatory Visit: Payer: Self-pay

## 2023-05-27 ENCOUNTER — Encounter (HOSPITAL_COMMUNITY)
Admission: RE | Admit: 2023-05-27 | Discharge: 2023-05-27 | Disposition: A | Discharge status: Home / Self Care | Payer: Medicare HMO | Source: Ambulatory Visit | Attending: Neurosurgery | Admitting: Neurosurgery

## 2023-05-27 ENCOUNTER — Encounter (HOSPITAL_COMMUNITY): Payer: Self-pay

## 2023-05-27 VITALS — BP 136/72 | HR 86 | Temp 98.4°F | Resp 18 | Ht 62.0 in | Wt 219.6 lb

## 2023-05-27 DIAGNOSIS — G4733 Obstructive sleep apnea (adult) (pediatric): Secondary | ICD-10-CM | POA: Insufficient documentation

## 2023-05-27 DIAGNOSIS — Z8673 Personal history of transient ischemic attack (TIA), and cerebral infarction without residual deficits: Secondary | ICD-10-CM | POA: Diagnosis not present

## 2023-05-27 DIAGNOSIS — Z01818 Encounter for other preprocedural examination: Secondary | ICD-10-CM | POA: Diagnosis not present

## 2023-05-27 DIAGNOSIS — E039 Hypothyroidism, unspecified: Secondary | ICD-10-CM | POA: Diagnosis not present

## 2023-05-27 DIAGNOSIS — Z86711 Personal history of pulmonary embolism: Secondary | ICD-10-CM | POA: Insufficient documentation

## 2023-05-27 DIAGNOSIS — I10 Essential (primary) hypertension: Secondary | ICD-10-CM | POA: Insufficient documentation

## 2023-05-27 DIAGNOSIS — E785 Hyperlipidemia, unspecified: Secondary | ICD-10-CM | POA: Diagnosis not present

## 2023-05-27 HISTORY — DX: Depression, unspecified: F32.A

## 2023-05-27 LAB — BASIC METABOLIC PANEL
Anion gap: 16 — ABNORMAL HIGH (ref 5–15)
BUN: 12 mg/dL (ref 8–23)
CO2: 25 mmol/L (ref 22–32)
Calcium: 9.8 mg/dL (ref 8.9–10.3)
Chloride: 95 mmol/L — ABNORMAL LOW (ref 98–111)
Creatinine, Ser: 0.86 mg/dL (ref 0.44–1.00)
GFR, Estimated: >60 mL/min (ref >60)
Glucose, Bld: 101 mg/dL — ABNORMAL HIGH (ref 70–99)
Potassium: 4.1 mmol/L (ref 3.5–5.1)
Sodium: 136 mmol/L (ref 135–145)

## 2023-05-27 LAB — CBC
HCT: 41 % (ref 36.0–46.0)
Hemoglobin: 12.9 g/dL (ref 12.0–15.0)
MCH: 29.3 pg (ref 26.0–34.0)
MCHC: 31.5 g/dL (ref 30.0–36.0)
MCV: 93.2 fL (ref 80.0–100.0)
Platelets: 280 10*3/uL (ref 150–400)
RBC: 4.4 MIL/uL (ref 3.87–5.11)
RDW: 13.1 % (ref 11.5–15.5)
WBC: 6.9 10*3/uL (ref 4.0–10.5)
nRBC: 0 % (ref 0.0–0.2)

## 2023-05-27 NOTE — Progress Notes (Signed)
PCP - Dr. Georgann Housekeeper Cardiologist - Pt denies. She did see Dr. Charlton Haws 01/03/22 but stated she was not instructed to f/u with cardiology  PPM/ICD - denies   Chest x-ray - 01/02/22 EKG - 04/23/23 Stress Test - 01/02/22 ECHO - 01/02/22 Cardiac Cath - denies  Sleep Study - OSA+ CPAP - nightly, does not know pressure settings  DM- denies  Blood Thinner Instructions: n/a Aspirin Instructions: f/u with surgeon for instructions  ERAS Protcol - no, NPO   COVID TEST- n/a   Anesthesia review: yes, cardiac hx (CAD, HTN, CVA, OSA)  Patient denies shortness of breath, fever, cough and chest pain at PAT appointment   All instructions explained to the patient, with a verbal understanding of the material. Patient agrees to go over the instructions while at home for a better understanding. Patient also instructed to self quarantine after being tested for COVID-19. The opportunity to ask questions was provided.

## 2023-05-28 DIAGNOSIS — Z03818 Encounter for observation for suspected exposure to other biological agents ruled out: Secondary | ICD-10-CM | POA: Diagnosis not present

## 2023-05-28 DIAGNOSIS — J4540 Moderate persistent asthma, uncomplicated: Secondary | ICD-10-CM | POA: Diagnosis not present

## 2023-05-28 DIAGNOSIS — J029 Acute pharyngitis, unspecified: Secondary | ICD-10-CM | POA: Diagnosis not present

## 2023-05-28 NOTE — Progress Notes (Signed)
Anesthesia Chart Review:  77 year old female with history of hypothyroidism, OSA on CPAP, HLD, HTN, coronary calcification on CT 10/2021, remote hx of PE, PONV, depression/anxiety/bipolar, CVA.  Patient was admitted 3/15 to 01/03/2022 for evaluation of chest pain.  Workup was benign.  Normal ECG, negative troponins, echo with EF 60 to 65% and no significant valvular abnormality, low risk Lexiscan.  Pain felt likely to be musculoskeletal.  She was discharged with recommendation to use Tylenol as needed for musculoskeletal pain.  No cardiology follow-up recommended.  Preop labs reviewed, unremarkable.  EKG 04/23/2023: Sinus rhythm.  Rate 75.  Multiple PVCs.  Nonspecific intraventricular conduction delay.  Nuclear stress 01/03/2022: IMPRESSION: 1. No reversible ischemia. Small fixed defect in the apical segment of the inferolateral wall suggesting small infarction.   2. Normal left ventricular wall motion.   3. Left ventricular ejection fraction 71%   4. Non invasive risk stratification*: Low  TTE 01/02/2022:  1. Left ventricular ejection fraction, by estimation, is 60 to 65%. The  left ventricle has normal function. The left ventricle has no regional  wall motion abnormalities. There is moderate asymmetric left ventricular  hypertrophy of the basal-septal  segment. Left ventricular diastolic parameters are consistent with Grade I  diastolic dysfunction (impaired relaxation).   2. Right ventricular systolic function is normal. The right ventricular  size is normal. There is normal pulmonary artery systolic pressure. The  estimated right ventricular systolic pressure is 24.3 mmHg.   3. The mitral valve is normal in structure. Trivial mitral valve  regurgitation.   4. The aortic valve is tricuspid. Aortic valve regurgitation is not  visualized. Aortic valve sclerosis/calcification is present, without any  evidence of aortic stenosis.    Zannie Cove [facility] Short Stay  Center/Anesthesiology Phone [phone] 05/28/2023 2:40 PM

## 2023-05-28 NOTE — Anesthesia Preprocedure Evaluation (Addendum)
Anesthesia Evaluation  Patient identified by MRN, date of birth, ID band Patient awake    Reviewed: Allergy & Precautions, NPO status , Patient's Chart, lab work & pertinent test results  History of Anesthesia Complications (+) PONV and history of anesthetic complications  Airway Mallampati: III  TM Distance: >3 FB Neck ROM: Full    Dental no notable dental hx.    Pulmonary asthma , sleep apnea and Continuous Positive Airway Pressure Ventilation , PE   Pulmonary exam normal breath sounds clear to auscultation       Cardiovascular hypertension, Pt. on medications Normal cardiovascular exam Rhythm:Regular Rate:Normal     Neuro/Psych  Headaches PSYCHIATRIC DISORDERS Anxiety Depression Bipolar Disorder   CVA (2019), Residual Symptoms    GI/Hepatic Neg liver ROS, hiatal hernia, PUD,,,  Endo/Other  Hypothyroidism  Morbid obesity  Renal/GU negative Renal ROS     Musculoskeletal  (+) Arthritis ,    Abdominal  (+) + obese  Peds  Hematology negative hematology ROS (+)   Anesthesia Other Findings CARPAL TUNNEL SYNDROME OF RIGHT WRIST  Reproductive/Obstetrics                             Anesthesia Physical Anesthesia Plan  ASA: 3  Anesthesia Plan: General   Post-op Pain Management:    Induction: Intravenous  PONV Risk Score and Plan: 4 or greater and Ondansetron, Dexamethasone, Propofol infusion and Treatment may vary due to age or medical condition  Airway Management Planned: LMA  Additional Equipment:   Intra-op Plan:   Post-operative Plan: Extubation in OR  Informed Consent: I have reviewed the patients History and Physical, chart, labs and discussed the procedure including the risks, benefits and alternatives for the proposed anesthesia with the patient or authorized representative who has indicated his/her understanding and acceptance.     Dental advisory given  Plan Discussed  with: CRNA  Anesthesia Plan Comments: (PAT note by Antionette Poles, PA-C: 77 year old female with history of hypothyroidism, OSA on CPAP, HLD, HTN, coronary calcification on CT 10/2021, remote hx of PE, PONV, depression/anxiety/bipolar, CVA.  Patient was admitted 3/15 to 01/03/2022 for evaluation of chest pain.  Workup was benign.  Normal ECG, negative troponins, echo with EF 60 to 65% and no significant valvular abnormality, low risk Lexiscan.  Pain felt likely to be musculoskeletal.  She was discharged with recommendation to use Tylenol as needed for musculoskeletal pain.  No cardiology follow-up recommended.  Preop labs reviewed, unremarkable.  EKG 04/23/2023: Sinus rhythm.  Rate 75.  Multiple PVCs.  Nonspecific intraventricular conduction delay.  Nuclear stress 01/03/2022: IMPRESSION: 1. No reversible ischemia. Small fixed defect in the apical segment of the inferolateral wall suggesting small infarction.  2. Normal left ventricular wall motion.  3. Left ventricular ejection fraction 71%  4. Non invasive risk stratification*: Low  TTE 01/02/2022: 1. Left ventricular ejection fraction, by estimation, is 60 to 65%. The  left ventricle has normal function. The left ventricle has no regional  wall motion abnormalities. There is moderate asymmetric left ventricular  hypertrophy of the basal-septal  segment. Left ventricular diastolic parameters are consistent with Grade I  diastolic dysfunction (impaired relaxation).  2. Right ventricular systolic function is normal. The right ventricular  size is normal. There is normal pulmonary artery systolic pressure. The  estimated right ventricular systolic pressure is 24.3 mmHg.  3. The mitral valve is normal in structure. Trivial mitral valve  regurgitation.  4. The aortic valve is tricuspid.  Aortic valve regurgitation is not  visualized. Aortic valve sclerosis/calcification is present, without any  evidence of aortic stenosis.   )         Anesthesia Quick Evaluation

## 2023-05-29 NOTE — Progress Notes (Signed)
Pt called and said she saw her PCP yesterday 8/7. I had advised her to go due to a sore throat at PAT appt. Pt was COVID tested and it was negative. She said Dr. Donette Larry thought it was seasonal allergy related and gave her prednisone. Pt states she is not having any new or worsening symptoms

## 2023-06-03 NOTE — Progress Notes (Signed)
Patient verbalized understanding of new arrival time of 1200 tomorrow.

## 2023-06-04 ENCOUNTER — Ambulatory Visit (HOSPITAL_COMMUNITY): Payer: Medicare HMO | Admitting: Physician Assistant

## 2023-06-04 ENCOUNTER — Other Ambulatory Visit: Payer: Self-pay

## 2023-06-04 ENCOUNTER — Encounter (HOSPITAL_COMMUNITY)
Admission: RE | Disposition: A | Discharge status: Home / Self Care | Payer: Self-pay | Source: Home / Self Care | Attending: Neurosurgery

## 2023-06-04 ENCOUNTER — Ambulatory Visit (HOSPITAL_COMMUNITY)
Admission: RE | Admit: 2023-06-04 | Discharge: 2023-06-04 | Disposition: A | Discharge status: Home / Self Care | Payer: Medicare HMO | Attending: Neurosurgery | Admitting: Neurosurgery

## 2023-06-04 ENCOUNTER — Ambulatory Visit (HOSPITAL_COMMUNITY): Payer: Medicare HMO | Admitting: Anesthesiology

## 2023-06-04 ENCOUNTER — Encounter (HOSPITAL_COMMUNITY): Payer: Self-pay | Admitting: Neurosurgery

## 2023-06-04 DIAGNOSIS — F418 Other specified anxiety disorders: Secondary | ICD-10-CM | POA: Diagnosis not present

## 2023-06-04 DIAGNOSIS — K449 Diaphragmatic hernia without obstruction or gangrene: Secondary | ICD-10-CM | POA: Diagnosis not present

## 2023-06-04 DIAGNOSIS — Z6839 Body mass index (BMI) 39.0-39.9, adult: Secondary | ICD-10-CM | POA: Insufficient documentation

## 2023-06-04 DIAGNOSIS — Z8673 Personal history of transient ischemic attack (TIA), and cerebral infarction without residual deficits: Secondary | ICD-10-CM | POA: Insufficient documentation

## 2023-06-04 DIAGNOSIS — I10 Essential (primary) hypertension: Secondary | ICD-10-CM | POA: Diagnosis not present

## 2023-06-04 DIAGNOSIS — E785 Hyperlipidemia, unspecified: Secondary | ICD-10-CM | POA: Diagnosis not present

## 2023-06-04 DIAGNOSIS — F319 Bipolar disorder, unspecified: Secondary | ICD-10-CM | POA: Insufficient documentation

## 2023-06-04 DIAGNOSIS — J45909 Unspecified asthma, uncomplicated: Secondary | ICD-10-CM | POA: Insufficient documentation

## 2023-06-04 DIAGNOSIS — G5621 Lesion of ulnar nerve, right upper limb: Secondary | ICD-10-CM | POA: Diagnosis not present

## 2023-06-04 DIAGNOSIS — G5601 Carpal tunnel syndrome, right upper limb: Secondary | ICD-10-CM | POA: Insufficient documentation

## 2023-06-04 DIAGNOSIS — E039 Hypothyroidism, unspecified: Secondary | ICD-10-CM | POA: Insufficient documentation

## 2023-06-04 DIAGNOSIS — G4733 Obstructive sleep apnea (adult) (pediatric): Secondary | ICD-10-CM | POA: Insufficient documentation

## 2023-06-04 DIAGNOSIS — F419 Anxiety disorder, unspecified: Secondary | ICD-10-CM | POA: Diagnosis not present

## 2023-06-04 HISTORY — PX: ULNAR NERVE TRANSPOSITION: SHX2595

## 2023-06-04 HISTORY — PX: CARPAL TUNNEL RELEASE: SHX101

## 2023-06-04 LAB — SURGICAL PCR SCREEN
MRSA, PCR: NEGATIVE
Staphylococcus aureus: NEGATIVE

## 2023-06-04 SURGERY — CARPAL TUNNEL RELEASE
Anesthesia: General | Site: Wrist | Laterality: Right

## 2023-06-04 MED ORDER — FENTANYL CITRATE (PF) 100 MCG/2ML IJ SOLN
INTRAMUSCULAR | Status: DC | PRN
  Administered 2023-06-04 (×2): 25 ug via INTRAVENOUS

## 2023-06-04 MED ORDER — DEXAMETHASONE SODIUM PHOSPHATE 10 MG/ML IJ SOLN
INTRAMUSCULAR | Status: DC | PRN
Start: 2023-06-04 — End: 2023-06-04
  Administered 2023-06-04: 10 mg via INTRAVENOUS

## 2023-06-04 MED ORDER — CHLORHEXIDINE GLUCONATE 0.12 % MT SOLN: 15.0000 mL | Freq: Once | OROMUCOSAL | Status: AC

## 2023-06-04 MED ORDER — ONDANSETRON HCL 4 MG/2ML IJ SOLN: 4.0000 mg | Freq: Once | INTRAMUSCULAR | Status: DC | PRN

## 2023-06-04 MED ORDER — ORAL CARE MOUTH RINSE: 15.0000 mL | Freq: Once | OROMUCOSAL | Status: AC

## 2023-06-04 MED ORDER — ONDANSETRON HCL 4 MG/2ML IJ SOLN
INTRAMUSCULAR | Status: DC | PRN
  Administered 2023-06-04: 4 mg via INTRAVENOUS

## 2023-06-04 MED ORDER — FENTANYL CITRATE (PF) 100 MCG/2ML IJ SOLN: 25.0000 ug | INTRAMUSCULAR | Status: DC | PRN

## 2023-06-04 MED ORDER — LIDOCAINE 2% (20 MG/ML) 5 ML SYRINGE
INTRAMUSCULAR | Status: AC
  Filled 2023-06-04: qty 5

## 2023-06-04 MED ORDER — CHLORHEXIDINE GLUCONATE CLOTH 2 % EX PADS: 6.0000 | MEDICATED_PAD | Freq: Once | CUTANEOUS | Status: DC

## 2023-06-04 MED ORDER — ONDANSETRON HCL 4 MG/2ML IJ SOLN
INTRAMUSCULAR | Status: AC
  Filled 2023-06-04: qty 2

## 2023-06-04 MED ORDER — HYDROCODONE-ACETAMINOPHEN 5-325 MG PO TABS: 1.0000 | ORAL_TABLET | ORAL | 0 refills | Status: DC | PRN

## 2023-06-04 MED ORDER — LACTATED RINGERS IV SOLN: INTRAVENOUS | Status: DC

## 2023-06-04 MED ORDER — LIDOCAINE HCL (CARDIAC) PF 100 MG/5ML IV SOSY
PREFILLED_SYRINGE | INTRAVENOUS | Status: DC | PRN
  Administered 2023-06-04: 60 mg via INTRATRACHEAL

## 2023-06-04 MED ORDER — CEFAZOLIN SODIUM-DEXTROSE 2-4 GM/100ML-% IV SOLN
INTRAVENOUS | Status: AC
  Filled 2023-06-04: qty 100

## 2023-06-04 MED ORDER — ACETAMINOPHEN 10 MG/ML IV SOLN: 1000.0000 mg | Freq: Once | INTRAVENOUS | Status: DC | PRN

## 2023-06-04 MED ORDER — PHENYLEPHRINE 80 MCG/ML (10ML) SYRINGE FOR IV PUSH (FOR BLOOD PRESSURE SUPPORT)
PREFILLED_SYRINGE | INTRAVENOUS | Status: DC | PRN
  Administered 2023-06-04 (×5): 160 ug via INTRAVENOUS

## 2023-06-04 MED ORDER — PROPOFOL 10 MG/ML IV BOLUS
INTRAVENOUS | Status: DC | PRN
  Administered 2023-06-04: 150 mg via INTRAVENOUS
  Administered 2023-06-04: 50 mg via INTRAVENOUS

## 2023-06-04 MED ORDER — AMISULPRIDE (ANTIEMETIC) 5 MG/2ML IV SOLN: 10.0000 mg | Freq: Once | INTRAVENOUS | Status: DC | PRN

## 2023-06-04 MED ORDER — BUPIVACAINE-EPINEPHRINE 0.5% -1:200000 IJ SOLN
INTRAMUSCULAR | Status: DC | PRN
  Administered 2023-06-04: 3 mL

## 2023-06-04 MED ORDER — 0.9 % SODIUM CHLORIDE (POUR BTL) OPTIME
TOPICAL | Status: DC | PRN
  Administered 2023-06-04: 1000 mL

## 2023-06-04 MED ORDER — PROPOFOL 10 MG/ML IV BOLUS
INTRAVENOUS | Status: AC
  Filled 2023-06-04: qty 20

## 2023-06-04 MED ORDER — EPHEDRINE SULFATE-NACL 50-0.9 MG/10ML-% IV SOSY
PREFILLED_SYRINGE | INTRAVENOUS | Status: DC | PRN
  Administered 2023-06-04: 10 mg via INTRAVENOUS
  Administered 2023-06-04: 15 mg via INTRAVENOUS

## 2023-06-04 MED ORDER — ONDANSETRON HCL 4 MG/2ML IJ SOLN
4.0000 mg | Freq: Once | INTRAMUSCULAR | Status: AC
  Administered 2023-06-04: 4 mg via INTRAVENOUS

## 2023-06-04 MED ORDER — FENTANYL CITRATE (PF) 100 MCG/2ML IJ SOLN
INTRAMUSCULAR | Status: AC
  Filled 2023-06-04: qty 2

## 2023-06-04 MED ORDER — DEXAMETHASONE SODIUM PHOSPHATE 10 MG/ML IJ SOLN
INTRAMUSCULAR | Status: AC
  Filled 2023-06-04: qty 1

## 2023-06-04 MED ORDER — CHLORHEXIDINE GLUCONATE 0.12 % MT SOLN
OROMUCOSAL | Status: AC
  Administered 2023-06-04: 15 mL via OROMUCOSAL
  Filled 2023-06-04: qty 15

## 2023-06-04 MED ORDER — CEFAZOLIN SODIUM-DEXTROSE 2-4 GM/100ML-% IV SOLN
2.0000 g | INTRAVENOUS | Status: AC
  Administered 2023-06-04: 2 g via INTRAVENOUS

## 2023-06-04 MED ORDER — BACITRACIN ZINC 500 UNIT/GM EX OINT
TOPICAL_OINTMENT | CUTANEOUS | Status: AC
  Filled 2023-06-04: qty 28.35

## 2023-06-04 MED ORDER — EPHEDRINE 5 MG/ML INJ
INTRAVENOUS | Status: AC
  Filled 2023-06-04: qty 5

## 2023-06-04 MED ORDER — BUPIVACAINE-EPINEPHRINE (PF) 0.5% -1:200000 IJ SOLN
INTRAMUSCULAR | Status: AC
  Filled 2023-06-04: qty 30

## 2023-06-04 MED ORDER — PHENYLEPHRINE 80 MCG/ML (10ML) SYRINGE FOR IV PUSH (FOR BLOOD PRESSURE SUPPORT)
PREFILLED_SYRINGE | INTRAVENOUS | Status: AC
  Filled 2023-06-04: qty 10

## 2023-06-04 SURGICAL SUPPLY — 45 items
APL SKNCLS STERI-STRIP NONHPOA (GAUZE/BANDAGES/DRESSINGS) ×2
BAG COUNTER SPONGE SURGICOUNT (BAG) ×2 IMPLANT
BAG SPNG CNTER NS LX DISP (BAG) ×2
BENZOIN TINCTURE PRP APPL 2/3 (GAUZE/BANDAGES/DRESSINGS) ×2 IMPLANT
BLADE SURG 15 STRL LF DISP TIS (BLADE) ×2 IMPLANT
BLADE SURG 15 STRL SS (BLADE) ×2
BNDG CMPR 75X41 PLY ABS (GAUZE/BANDAGES/DRESSINGS) ×2
BNDG CMPR 75X41 PLY HI ABS (GAUZE/BANDAGES/DRESSINGS) ×2
BNDG CMPR MD 5X2 ELC HKLP STRL (GAUZE/BANDAGES/DRESSINGS) ×2
BNDG ELASTIC 2 VLCR STRL LF (GAUZE/BANDAGES/DRESSINGS) IMPLANT
BNDG ELASTIC 4X5.8 VLCR STR LF (GAUZE/BANDAGES/DRESSINGS) ×2 IMPLANT
BNDG STRETCH 4X75 NS LF (GAUZE/BANDAGES/DRESSINGS) IMPLANT
BNDG STRETCH 4X75 STRL LF (GAUZE/BANDAGES/DRESSINGS) ×2 IMPLANT
CABLE BIPOLOR RESECTION CORD (MISCELLANEOUS) ×2 IMPLANT
DRAPE EXTREMITY T 121X128X90 (DISPOSABLE) ×2 IMPLANT
DRAPE HALF SHEET 40X57 (DRAPES) ×6 IMPLANT
GAUZE 4X4 16PLY ~~LOC~~+RFID DBL (SPONGE) ×2 IMPLANT
GAUZE SPONGE 4X4 12PLY STRL (GAUZE/BANDAGES/DRESSINGS) ×2 IMPLANT
GLOVE BIO SURGEON STRL SZ8 (GLOVE) ×2 IMPLANT
GLOVE BIO SURGEON STRL SZ8.5 (GLOVE) ×2 IMPLANT
GLOVE EXAM NITRILE XL STR (GLOVE) IMPLANT
GOWN STRL REUS W/ TWL LRG LVL3 (GOWN DISPOSABLE) ×2 IMPLANT
GOWN STRL REUS W/ TWL XL LVL3 (GOWN DISPOSABLE) ×2 IMPLANT
GOWN STRL REUS W/TWL LRG LVL3 (GOWN DISPOSABLE) ×2
GOWN STRL REUS W/TWL XL LVL3 (GOWN DISPOSABLE) ×2
KIT BASIN OR (CUSTOM PROCEDURE TRAY) ×2 IMPLANT
KIT TURNOVER KIT B (KITS) ×2 IMPLANT
MARKER SKIN DUAL TIP RULER LAB (MISCELLANEOUS) ×2 IMPLANT
NDL HYPO 25X1 1.5 SAFETY (NEEDLE) ×2 IMPLANT
NEEDLE HYPO 25X1 1.5 SAFETY (NEEDLE) ×2 IMPLANT
NS IRRIG 1000ML POUR BTL (IV SOLUTION) ×2 IMPLANT
PACK BASIC III (CUSTOM PROCEDURE TRAY) ×2
PACK SRG BSC III STRL LF ECLPS (CUSTOM PROCEDURE TRAY) ×2 IMPLANT
PAD ARMBOARD 7.5X6 YLW CONV (MISCELLANEOUS) ×2 IMPLANT
STOCKINETTE 4X48 STRL (DRAPES) ×2 IMPLANT
STRIP CLOSURE SKIN 1/2X4 (GAUZE/BANDAGES/DRESSINGS) ×2 IMPLANT
SUT ETHILON 3 0 PS 1 (SUTURE) ×2 IMPLANT
SUT VIC AB 3-0 SH 8-18 (SUTURE) ×2 IMPLANT
SYR BULB EAR ULCER 3OZ GRN STR (SYRINGE) ×2 IMPLANT
SYR CONTROL 10ML LL (SYRINGE) ×2 IMPLANT
TAPE CLOTH SURG 4X10 WHT LF (GAUZE/BANDAGES/DRESSINGS) IMPLANT
TOWEL GREEN STERILE FF (TOWEL DISPOSABLE) ×6 IMPLANT
TUBE CONNECTING 12X1/4 (SUCTIONS) ×2 IMPLANT
UNDERPAD 30X36 HEAVY ABSORB (UNDERPADS AND DIAPERS) ×2 IMPLANT
WATER STERILE IRR 1000ML POUR (IV SOLUTION) ×2 IMPLANT

## 2023-06-04 NOTE — Transfer of Care (Signed)
Immediate Anesthesia Transfer of Care Note  Patient: Courtney [name]  Procedure(s) Performed: CARPAL TUNNEL RELEASE (Right: Wrist) ULNAR NERVE DECOMPRESSION/TRANSPOSITION (Right: Arm Lower)  Patient Location: PACU  Anesthesia Type:General  Level of Consciousness: awake  Airway & Oxygen Therapy: Patient Spontanous Breathing  Post-op Assessment: Report given to RN  Post vital signs: stable  Last Vitals:  Vitals Value Taken Time  BP 140/65 06/04/23 1634  Temp    Pulse 63 06/04/23 1636  Resp 14 06/04/23 1636  SpO2 100 % 06/04/23 1636  Vitals shown include unfiled device data.  Last Pain:  Vitals:   06/04/23 1211  TempSrc:   PainSc: 3          Complications: No notable events documented.

## 2023-06-04 NOTE — H&P (Signed)
Subjective: The patient is a 77 year old white female who has complained of right arm and hand pain consistent with ulnar neuropathy and carpal tunnel syndrome.  The diagnosis was confirmed with NCV EMGs.  We discussed the various treatment options.  She has decided proceed with surgery.  Past Medical History:  Diagnosis Date   Anemia    Aortic atherosclerosis (HCC)    Arthritis    Bilateral Wrists   Asthma    Bipolar disorder (HCC)    Coronary artery calcification    Depression    Diverticulitis    Headache    Hiatal hernia    History of kidney stones 2006   Hypertension    Hypothyroidism (acquired) 04/02/2021   Kidney stone    Mitral valve prolapse    OSA (obstructive sleep apnea) 04/02/2021   wears CPAP   Panic attack    PONV (postoperative nausea and vomiting)    Pulmonary embolus (HCC)    Stroke (HCC)    Thrombophlebitis    Vertigo     Past Surgical History:  Procedure Laterality Date   ABDOMINAL HYSTERECTOMY     ACHILLES TENDON SURGERY Right    ANTERIOR CERVICAL DECOMP/DISCECTOMY FUSION N/A 03/28/2021   Procedure: Cervical three-four Cervical four-five Cervical five-six Anterior cervical decompression/discectomy/fusion/interbody prosthesis/plate/screws;  Surgeon: Tressie Stalker, MD;  Location: [facility] OR;  Service: Neurosurgery;  Laterality: N/A;   BACK SURGERY     BREAST REDUCTION SURGERY     BUNIONECTOMY Right    CHOLECYSTECTOMY     EYE SURGERY Bilateral 2000   Cataract   JOINT REPLACEMENT Bilateral    Knee replacement   TOE SURGERY Right    hammer toe surgery   TONSILLECTOMY      Allergies  Allergen Reactions   Tetanus-Diphtheria Toxoids Td Anaphylaxis   Meloxicam Nausea And Vomiting   Phentermine Nausea And Vomiting   Sulfa Antibiotics Diarrhea and Nausea And Vomiting   Zithromax [Azithromycin] Diarrhea and Nausea And Vomiting    Social History   Tobacco Use   Smoking status: Never   Smokeless tobacco: Never  Substance Use Topics   Alcohol use: Yes     Comment: Very rare- maybe once a year    Family History  Problem Relation Age of Onset   Stroke Father    Hyperlipidemia Father    Esophageal cancer Maternal Uncle    Breast cancer Paternal Aunt        29s   CAD Neg Hx    Prior to Admission medications   Medication Sig Start Date End Date Taking? Authorizing Provider  albuterol (VENTOLIN HFA) 108 (90 Base) MCG/ACT inhaler Inhale 1 puff into the lungs every 6 (six) hours as needed for wheezing or shortness of breath. 02/22/21  Yes [provider]  aspirin 325 MG tablet Take 325 mg by mouth daily.   Yes [provider]  atorvastatin (LIPITOR) 80 MG tablet Take 80 mg by mouth every evening. 02/21/21  Yes [provider]  azelastine (ASTELIN) 0.1 % nasal spray Place 1 spray into both nostrils 2 (two) times daily.   Yes [provider]  Biotin 5000 MCG CAPS Take 5,000 mcg by mouth daily.   Yes [provider]  buPROPion (WELLBUTRIN SR) 150 MG 12 hr tablet Take 150 mg by mouth daily.   Yes [provider]  Calcium Carb-Cholecalciferol (CALCIUM 600/VITAMIN D PO) Take 1 tablet by mouth daily.   Yes [provider]  Cholecalciferol (VITAMIN D3) 25 MCG (1000 UT) CAPS Take 1,000 Units  by mouth daily.   Yes [provider]  cyanocobalamin (,VITAMIN B-12,) 1000 MCG/ML injection Inject 1,000 mcg into the muscle every 30 (thirty) days. 02/23/21  Yes [provider]  diclofenac Sodium (VOLTAREN) 1 % GEL Apply 2 g topically 4 (four) times daily as needed (pain). 12/02/20  Yes [provider]  docusate sodium (COLACE) 100 MG capsule Take 100 mg by mouth daily.   Yes [provider]  escitalopram (LEXAPRO) 10 MG tablet Take 10 mg by mouth daily. 11/24/20  Yes [provider]  gabapentin (NEURONTIN) 100 MG capsule Take 100 mg by mouth in the morning, at noon, and at bedtime. 02/13/21  Yes [provider]  ipratropium (ATROVENT) 0.03 % nasal spray  Place 1 spray into both nostrils daily. 04/01/23 03/31/24 Yes [provider]  levothyroxine (SYNTHROID) 50 MCG tablet Take 50 mcg by mouth at bedtime. 02/09/21  Yes [provider]  LORazepam (ATIVAN) 1 MG tablet Take 1 tablet (1 mg total) by mouth at bedtime. 01/03/22  Yes Tyrone Nine, MD  magnesium oxide (MAG-OX) 400 (240 Mg) MG tablet Take 400 mg by mouth daily.   Yes [provider]  meclizine (ANTIVERT) 25 MG tablet Take 25 mg by mouth every 8 (eight) hours as needed for dizziness.   Yes [provider]  melatonin 5 MG TABS Take 5 mg by mouth at bedtime.   Yes [provider]  montelukast (SINGULAIR) 10 MG tablet Take 10 mg by mouth at bedtime. 02/13/21  Yes [provider]  multivitamin-lutein (OCUVITE-LUTEIN) CAPS capsule Take 1 capsule by mouth daily.   Yes [provider]  olmesartan (BENICAR) 40 MG tablet Take 40 mg by mouth daily. 02/21/21  Yes [provider]  oxybutynin (DITROPAN-XL) 10 MG 24 hr tablet Take 10 mg by mouth daily.   Yes [provider]  pantoprazole (PROTONIX) 40 MG tablet Take 40 mg by mouth daily.   Yes [provider]  promethazine (PHENERGAN) 25 MG tablet Take 25 mg by mouth every 6 (six) hours as needed for vomiting or nausea. 12/22/17  Yes [provider]  TRELEGY ELLIPTA 100-62.5-25 MCG/ACT AEPB Inhale 1 puff into the lungs daily.   Yes [provider]  verapamil (CALAN-SR) 120 MG CR tablet Take 360 mg by mouth daily. 02/13/21  Yes [provider]  cephALEXin (KEFLEX) 500 MG capsule Take 1 capsule (500 mg total) by mouth 4 (four) times daily. Patient not taking: Reported on 05/22/2023 04/23/23   Wynetta Fines, MD  PRESCRIPTION MEDICATION CPAP- At bedtime    [provider]     Review of Systems  Positive ROS: As above  All other systems have been reviewed and were otherwise negative with the exception of those mentioned in the HPI and as  above.  Objective: Vital signs in last 24 hours: Temp:  [97.9 F (36.6 C)] 97.9 F (36.6 C) (08/14 1200) Pulse Rate:  [77] 77 (08/14 1200) Resp:  [18] 18 (08/14 1200) BP: (156)/(65) 156/65 (08/14 1200) SpO2:  [95 %] 95 % (08/14 1200) Weight:  [98.9 kg] 98.9 kg (08/14 1200) Estimated body mass index is 39.87 kg/m as calculated from the following:   Height as of this encounter: 5\' 2"  (1.575 m).   Weight as of this encounter: 98.9 kg.   General Appearance: Alert Head: Normocephalic, without obvious abnormality, atraumatic Eyes: PERRL, conjunctiva/corneas clear, EOM's intact,    Ears: Normal  Throat: Normal  Neck: Supple, Back: unremarkable Lungs: Clear to  auscultation bilaterally, respirations unlabored Heart: Regular rate and rhythm, no murmur, rub or gallop Abdomen: Soft, non-tender Extremities: Extremities normal, atraumatic, no cyanosis or edema Skin: unremarkable  NEUROLOGIC:   Mental status: alert and oriented,Motor Exam - grossly normal Sensory Exam - grossly normal Reflexes:  Coordination - grossly normal Gait - grossly normal Balance - grossly normal Cranial Nerves: I: smell Not tested  II: visual acuity  OS: Normal  OD: Normal   II: visual fields Full to confrontation  II: pupils Equal, round, reactive to light  III,VII: ptosis None  III,IV,VI: extraocular muscles  Full ROM  V: mastication Normal  V: facial light touch sensation  Normal  V,VII: corneal reflex  Present  VII: facial muscle function - upper  Normal  VII: facial muscle function - lower Normal  VIII: hearing Not tested  IX: soft palate elevation  Normal  IX,X: gag reflex Present  XI: trapezius strength  5/5  XI: sternocleidomastoid strength 5/5  XI: neck flexion strength  5/5  XII: tongue strength  Normal    Data Review Lab Results  Component Value Date   WBC 6.9 05/27/2023   HGB 12.9 05/27/2023   HCT 41.0 05/27/2023   MCV 93.2 05/27/2023   PLT 280 05/27/2023   Lab Results   Component Value Date   NA 136 05/27/2023   K 4.1 05/27/2023   CL 95 (L) 05/27/2023   CO2 25 05/27/2023   BUN 12 05/27/2023   CREATININE 0.86 05/27/2023   GLUCOSE 101 (H) 05/27/2023   Lab Results  Component Value Date   INR 1.2 12/09/2021    Assessment/Plan: Right carpal tunnel syndrome, right ulnar neuropathy at the elbow: I have discussed situation with the patient.  We discussed the various treatment options.  I described the surgical option of a right carpal tunnel release and right ulnar nerve neurolysis at the elbow.  I described that surgery to her.  I have discussed the risk, benefits, alternatives, expected postop course, and likelihood of achieving our goals with surgery.  I have answered all her questions.  She has decided proceed with surgery.   Cristi Loron 06/04/2023 1:11 PM

## 2023-06-04 NOTE — Discharge Instructions (Addendum)
Wound Care Keep incisions dry until post op day 3 Do not put any creams, lotions, or ointments on incision. You are fine to shower the third day after surgery. Let water run over incision and pat dry.  Activity Walk each and every day, increasing distance each day. No lifting greater than 5 lbs with right hand No driving for 2 weeks; may ride as a passenger locally.  Diet Resume your normal diet.  Call Your Doctor If Any of These Occur Redness, drainage, or swelling at the wound.  Temperature greater than 101 degrees. Severe pain not relieved by pain medication. Incision starts to come apart.  Follow Up Appt Call [phone] today for appointment in 10 days

## 2023-06-04 NOTE — Op Note (Signed)
Brief history: The patient is a 77 year old white female who is complaining of right hand and arm pain and numbness consistent with carpal tunnel syndrome and ulnar neuropathy.  She was worked up with NCV EMGs which confirmed the diagnosis.  We discussed the various treatment options.  She decided to proceed with surgery.  Preop diagnosis: Right carpal tunnel syndrome; right ulnar neuropathy at the elbow  Postop diagnosis: The same  Procedure: Right carpal tunnel release; right ulnar nerve neurolysis at the elbow  Surgeon: Dr. Delma Officer  Assistant: Hildred Priest, NP  Anesthesia: LMA  Estimated blood loss: Minimal  Specimens: None  Drains: None  Complications: None  Description of procedure: The patient was brought to the operating room by the anesthesia team.  LMA anesthesia was induced.  The patient remained in supine position.  Her right upper extremity was prepared with Betadine scrub and Betadine solution.  Sterile drapes were applied.  I then injected the areas to be incised with Marcaine solution.  I used a 15 blade scalpel to make a incision along the course of the ulnar nerve at the elbow.  I used wheat Lander retractor for exposure.  I dissected deeper with the Metzenbaum scissors.  I identified the ulnar nerve in the ulnar groove.  I used the Metzenbaum scissors to perform a proximal and distal neurolysis of the ulnar nerve.  We now turned our attention to the carpal tunnel release.  I used a 15 blade scalpel to make an incision along the patient's palmar crease.  I used wheat Lander retractor for exposure.  I divided the superficial ligament with the scalpel.  I then identified the transverse carpal ligament.  I ligated with a 15 blade scalpel and performed a proximal and ligation of the transverse carpal ligament decompressing the median nerve.  We obtain hemostasis with electrocautery.  Then removed the retractors.  I reapproximated the patient's subcutaneous tissue with  interrupted 3-0 Vicryl suture.  I reapproximated the patient's hand incision skin with running 3-0 nylon suture.  I reapproximated the patient's elbow incision skin with Steri-Strips and benzoin.  The wounds were then coated with bacitracin ointment.  A sterile dressing was applied.  The drapes were removed.  By report all sponge, instrument, and needle counts were correct at the end of this case.

## 2023-06-04 NOTE — Discharge Summary (Signed)
Physician Discharge Summary     Providing Compassionate, Quality Care - Together   Patient ID: Courtney [name] MRN: [id number] DOB/AGE: 75/28/1947 75 y.o.  Admit date: 06/04/2023 Discharge date: 06/04/2023  Admission Diagnoses: Ulnar neuropathy, carpal tunnel syndrome  Discharge Diagnoses: Ulnar neuropathy, carpal tunnel syndrome    Discharged Condition: good  Hospital Course: Patient underwent a right carpal tunnel release and a right ulnar nerve neurolysis by Dr. Lovell Sheehan on 06/04/2023. She felt well and wished to be discharged home following recovery from anesthesia in the PACU. She is moving her hand and right upper extremity well. Her pain is well-controlled with oral pain medication. She is ready for discharge home.   Consults: None  Treatments: surgery: Right carpal tunnel release; right ulnar nerve neurolysis at the elbow   Discharge Exam: Blood pressure 126/73, pulse (!) 56, temperature 98.7 F (37.1 C), resp. rate 18, height 5\' 2"  (1.575 m), weight 98.9 kg, SpO2 96%.  By report: Alert and oriented x 4 PERRLA CN II-XII grossly intact MAE, Strength and sensation intact Incisions are covered with gauze dressings; Dressings are clean, dry, and intact   Disposition: Discharge disposition: 01-Home or Self Care       Discharge Instructions     Call MD for:  hives   Complete by: As directed    Call MD for:  persistant nausea and vomiting   Complete by: As directed    Call MD for:  redness, tenderness, or signs of infection (pain, swelling, redness, odor or green/yellow discharge around incision site)   Complete by: As directed    Call MD for:  severe uncontrolled pain   Complete by: As directed    Call MD for:  temperature >100.4   Complete by: As directed    Diet - low sodium heart healthy   Complete by: As directed    Increase activity slowly   Complete by: As directed    May shower / Bathe   Complete by: As directed       Allergies as of 06/04/2023        Reactions   Tetanus-diphtheria Toxoids Td Anaphylaxis   Meloxicam Nausea And Vomiting   Phentermine Nausea And Vomiting   Sulfa Antibiotics Diarrhea, Nausea And Vomiting   Zithromax [azithromycin] Diarrhea, Nausea And Vomiting        Medication List     STOP taking these medications    cephALEXin 500 MG capsule Commonly known as: KEFLEX       TAKE these medications    albuterol 108 (90 Base) MCG/ACT inhaler Commonly known as: VENTOLIN HFA Inhale 1 puff into the lungs every 6 (six) hours as needed for wheezing or shortness of breath.   aspirin 325 MG tablet Take 325 mg by mouth daily.   atorvastatin 80 MG tablet Commonly known as: LIPITOR Take 80 mg by mouth every evening.   azelastine 0.1 % nasal spray Commonly known as: ASTELIN Place 1 spray into both nostrils 2 (two) times daily.   Biotin 5000 MCG Caps Take 5,000 mcg by mouth daily.   buPROPion 150 MG 12 hr tablet Commonly known as: WELLBUTRIN SR Take 150 mg by mouth daily.   CALCIUM 600/VITAMIN D PO Take 1 tablet by mouth daily.   cyanocobalamin 1000 MCG/ML injection Commonly known as: VITAMIN B12 Inject 1,000 mcg into the muscle every 30 (thirty) days.   diclofenac Sodium 1 % Gel Commonly known as: VOLTAREN Apply 2 g topically 4 (four) times daily as needed (pain).  docusate sodium 100 MG capsule Commonly known as: COLACE Take 100 mg by mouth daily.   escitalopram 10 MG tablet Commonly known as: LEXAPRO Take 10 mg by mouth daily.   gabapentin 100 MG capsule Commonly known as: NEURONTIN Take 100 mg by mouth in the morning, at noon, and at bedtime.   HYDROcodone-acetaminophen 5-325 MG tablet Commonly known as: NORCO/VICODIN Take 1 tablet by mouth every 4 (four) hours as needed for moderate pain.   ipratropium 0.03 % nasal spray Commonly known as: ATROVENT Place 1 spray into both nostrils daily.   levothyroxine 50 MCG tablet Commonly known as: SYNTHROID Take 50 mcg by mouth at  bedtime.   LORazepam 1 MG tablet Commonly known as: ATIVAN Take 1 tablet (1 mg total) by mouth at bedtime.   magnesium oxide 400 (240 Mg) MG tablet Commonly known as: MAG-OX Take 400 mg by mouth daily.   meclizine 25 MG tablet Commonly known as: ANTIVERT Take 25 mg by mouth every 8 (eight) hours as needed for dizziness.   melatonin 5 MG Tabs Take 5 mg by mouth at bedtime.   montelukast 10 MG tablet Commonly known as: SINGULAIR Take 10 mg by mouth at bedtime.   multivitamin-lutein Caps capsule Take 1 capsule by mouth daily.   olmesartan 40 MG tablet Commonly known as: BENICAR Take 40 mg by mouth daily.   oxybutynin 10 MG 24 hr tablet Commonly known as: DITROPAN-XL Take 10 mg by mouth daily.   pantoprazole 40 MG tablet Commonly known as: PROTONIX Take 40 mg by mouth daily.   PRESCRIPTION MEDICATION CPAP- At bedtime   promethazine 25 MG tablet Commonly known as: PHENERGAN Take 25 mg by mouth every 6 (six) hours as needed for vomiting or nausea.   Trelegy Ellipta 100-62.5-25 MCG/ACT Aepb Generic drug: Fluticasone-Umeclidin-Vilant Inhale 1 puff into the lungs daily.   verapamil 120 MG CR tablet Commonly known as: CALAN-SR Take 360 mg by mouth daily.   Vitamin D3 25 MCG (1000 UT) Caps Take 1,000 Units by mouth daily.        Follow-up Information     Tressie Stalker, MD. Schedule an appointment as soon as possible for a visit in 10 day(s).   Specialty: Neurosurgery Why: Sutures will be removed at the first post visit. Contact information: [address] [place] [address] [phone]                 Signed: Val Eagle, DNP, AGNP-C Nurse Practitioner  [facility] [address], [place], [place] [address] P: [phone]    F: [phone]  06/04/2023, 5:18 PM

## 2023-06-04 NOTE — Anesthesia Procedure Notes (Addendum)
Procedure Name: LMA Insertion Date/Time: 06/04/2023 3:19 PM  Performed by: Leonides Grills, MDPre-anesthesia Checklist: Patient identified, Emergency Drugs available, Suction available and Patient being monitored Patient Re-evaluated:Patient Re-evaluated prior to induction Oxygen Delivery Method: Circle system utilized Preoxygenation: Pre-oxygenation with 100% oxygen Induction Type: IV induction Ventilation: Mask ventilation without difficulty LMA: LMA inserted LMA Size: 4.0 Number of attempts: 1 Tube secured with: Tape Dental Injury: Teeth and Oropharynx as per pre-operative assessment

## 2023-06-05 ENCOUNTER — Encounter (HOSPITAL_COMMUNITY): Payer: Self-pay | Admitting: Neurosurgery

## 2023-06-05 DIAGNOSIS — F419 Anxiety disorder, unspecified: Secondary | ICD-10-CM | POA: Diagnosis not present

## 2023-06-05 DIAGNOSIS — E039 Hypothyroidism, unspecified: Secondary | ICD-10-CM | POA: Diagnosis not present

## 2023-06-05 DIAGNOSIS — T8130XA Disruption of wound, unspecified, initial encounter: Secondary | ICD-10-CM

## 2023-06-05 DIAGNOSIS — G5600 Carpal tunnel syndrome, unspecified upper limb: Secondary | ICD-10-CM

## 2023-06-05 DIAGNOSIS — Z7689 Persons encountering health services in other specified circumstances: Secondary | ICD-10-CM

## 2023-06-05 DIAGNOSIS — G4733 Obstructive sleep apnea (adult) (pediatric): Secondary | ICD-10-CM | POA: Diagnosis not present

## 2023-06-05 NOTE — Anesthesia Postprocedure Evaluation (Signed)
Anesthesia Post Note  Patient: Courtney [name]  Procedure(s) Performed: CARPAL TUNNEL RELEASE (Right: Wrist) ULNAR NERVE DECOMPRESSION/TRANSPOSITION (Right: Arm Lower)     Patient location during evaluation: PACU Anesthesia Type: General Level of consciousness: awake Pain management: pain level controlled Vital Signs Assessment: post-procedure vital signs reviewed and stable Respiratory status: spontaneous breathing, nonlabored ventilation and respiratory function stable Cardiovascular status: blood pressure returned to baseline and stable Postop Assessment: no apparent nausea or vomiting Anesthetic complications: no   No notable events documented.  Last Vitals:  Vitals:   06/04/23 1700 06/04/23 1715  BP: 116/64 126/73  Pulse: (!) 57 (!) 56  Resp: 17 18  Temp:  37.1 C  SpO2: 96% 96%    Last Pain:  Vitals:   06/04/23 1700  TempSrc:   PainSc: 0-No pain                  P 

## 2023-06-09 DIAGNOSIS — G5600 Carpal tunnel syndrome, unspecified upper limb: Secondary | ICD-10-CM | POA: Diagnosis not present

## 2023-06-09 DIAGNOSIS — T8130XD Disruption of wound, unspecified, subsequent encounter: Secondary | ICD-10-CM | POA: Diagnosis not present

## 2023-06-09 DIAGNOSIS — Z7689 Persons encountering health services in other specified circumstances: Secondary | ICD-10-CM | POA: Diagnosis not present

## 2023-06-26 ENCOUNTER — Encounter (HOSPITAL_BASED_OUTPATIENT_CLINIC_OR_DEPARTMENT_OTHER): Payer: Self-pay

## 2023-06-26 ENCOUNTER — Other Ambulatory Visit: Payer: Self-pay

## 2023-06-26 ENCOUNTER — Emergency Department (HOSPITAL_BASED_OUTPATIENT_CLINIC_OR_DEPARTMENT_OTHER)
Admission: EM | Admit: 2023-06-26 | Discharge: 2023-06-26 | Disposition: A | Discharge status: Home / Self Care | Payer: Medicare HMO | Attending: Emergency Medicine | Admitting: Emergency Medicine

## 2023-06-26 ENCOUNTER — Emergency Department (HOSPITAL_BASED_OUTPATIENT_CLINIC_OR_DEPARTMENT_OTHER): Payer: Medicare HMO

## 2023-06-26 DIAGNOSIS — Z8673 Personal history of transient ischemic attack (TIA), and cerebral infarction without residual deficits: Secondary | ICD-10-CM | POA: Insufficient documentation

## 2023-06-26 DIAGNOSIS — Z7982 Long term (current) use of aspirin: Secondary | ICD-10-CM | POA: Insufficient documentation

## 2023-06-26 DIAGNOSIS — W1830XA Fall on same level, unspecified, initial encounter: Secondary | ICD-10-CM | POA: Diagnosis not present

## 2023-06-26 DIAGNOSIS — Z79890 Hormone replacement therapy (postmenopausal): Secondary | ICD-10-CM | POA: Insufficient documentation

## 2023-06-26 DIAGNOSIS — E039 Hypothyroidism, unspecified: Secondary | ICD-10-CM | POA: Insufficient documentation

## 2023-06-26 DIAGNOSIS — S0990XA Unspecified injury of head, initial encounter: Secondary | ICD-10-CM | POA: Diagnosis not present

## 2023-06-26 DIAGNOSIS — R519 Headache, unspecified: Secondary | ICD-10-CM | POA: Diagnosis present

## 2023-06-26 DIAGNOSIS — S060X0A Concussion without loss of consciousness, initial encounter: Secondary | ICD-10-CM | POA: Insufficient documentation

## 2023-06-26 NOTE — Discharge Instructions (Signed)
CAT scan today did not show any signs of internal bleeding.  However you probably have a concussion.  Avoid eyestrain, stay hydrated and rest.  If you are still having a headache in 2 weeks you should follow-up with the concussion clinic.  If you start having vision changes, confusion, vomiting, inability to eat, difficulty walking return to the emergency room.

## 2023-06-26 NOTE — ED Notes (Signed)
Patient states she takes 325mg  of aspirin each day. Patient states she hit her head on the floor when she fell five days ago. Patient states the head pain feels like a lot of pressure as if her head could just pop. Patient denies LOC with fall. Patient denies any change in vision or hearing with headache, but does have some dizziness. Patient states sometimes when she stands it takes her a moment to get steady.

## 2023-06-26 NOTE — ED Triage Notes (Signed)
POV from home, A&O x4,  GCS 15, bib wheelchair, uses wheelchair and walker at baseline  Pt sts that she lost balance and fell on Sunday, hit head, -LOC, no blood thinners, c/o continued headaches since then.

## 2023-06-26 NOTE — ED Provider Notes (Signed)
[place] EMERGENCY DEPARTMENT AT [facility] Provider Note   CSN: [id number] Arrival date & time: 06/26/23  1923     History  Chief Complaint  Patient presents with   Headache    Courtney [name] is a 77 y.o. female.  Patient is a 77 year old female with a history of PE, prior stroke, anemia, hypothyroidism, phlebitis, mitral valve prolapse and prior vertigo who is presenting today with an ongoing headache since Sunday morning.  She reports that she was at a camp and staying in bunk beds.  She was on the bottom bunk and she was getting up about 5 in the morning to go to the bathroom.  She had her walker but when she got out of the bed she lost her balance and fell backwards hitting her head on the wood floor.  She did not have any loss of consciousness and her son immediately got up to help her.  She reports otherwise since the fall she has been doing okay except she has had a constant headache since that time.  She has not had any difficulty walking with her walker, denies any nausea or vomiting.  No unilateral leg pain, numbness or weakness.  She does not take anticoagulation but is on a full-strength 324 mg aspirin.  She has had some mild right sided neck pain but no pain that goes down into her arms.  She is photosensitive and has been taking Tylenol but reports the headache is just been persistent.  She lives at [facility] and they mentioned it today and they sent her over for evaluation.  She denies any confusion or visual changes.  The history is provided by the patient.  Headache      Home Medications Prior to Admission medications   Medication Sig Start Date End Date Taking? Authorizing Provider  albuterol (VENTOLIN HFA) 108 (90 Base) MCG/ACT inhaler Inhale 1 puff into the lungs every 6 (six) hours as needed for wheezing or shortness of breath. 02/22/21   [provider]  aspirin 325 MG tablet Take 325 mg by mouth daily.    [provider]   atorvastatin (LIPITOR) 80 MG tablet Take 80 mg by mouth every evening. 02/21/21   [provider]  azelastine (ASTELIN) 0.1 % nasal spray Place 1 spray into both nostrils 2 (two) times daily.    [provider]  Biotin 5000 MCG CAPS Take 5,000 mcg by mouth daily.    [provider]  buPROPion (WELLBUTRIN SR) 150 MG 12 hr tablet Take 150 mg by mouth daily.    [provider]  Calcium Carb-Cholecalciferol (CALCIUM 600/VITAMIN D PO) Take 1 tablet by mouth daily.    [provider]  Cholecalciferol (VITAMIN D3) 25 MCG (1000 UT) CAPS Take 1,000 Units by mouth daily.    [provider]  cyanocobalamin (,VITAMIN B-12,) 1000 MCG/ML injection Inject 1,000 mcg into the muscle every 30 (thirty) days. 02/23/21   [provider]  diclofenac Sodium (VOLTAREN) 1 % GEL Apply 2 g topically 4 (four) times daily as needed (pain). 12/02/20   [provider]  docusate sodium (COLACE) 100 MG capsule Take 100 mg by mouth daily.    [provider]  escitalopram (LEXAPRO) 10 MG tablet Take 10 mg by mouth daily. 11/24/20   [provider]  gabapentin (NEURONTIN) 100 MG capsule Take 100 mg by mouth in the morning, at noon, and at bedtime. 02/13/21   [provider]  HYDROcodone-acetaminophen (NORCO/VICODIN) 5-325 MG tablet  Take 1 tablet by mouth every 4 (four) hours as needed for moderate pain. 06/04/23 06/03/24  Val Eagle D, NP  ipratropium (ATROVENT) 0.03 % nasal spray Place 1 spray into both nostrils daily. 04/01/23 03/31/24  [provider]  levothyroxine (SYNTHROID) 50 MCG tablet Take 50 mcg by mouth at bedtime. 02/09/21   [provider]  LORazepam (ATIVAN) 1 MG tablet Take 1 tablet (1 mg total) by mouth at bedtime. 01/03/22   Tyrone Nine, MD  magnesium oxide (MAG-OX) 400 (240 Mg) MG tablet Take 400 mg by mouth daily.    [provider]  meclizine (ANTIVERT) 25 MG tablet Take 25 mg by mouth every 8  (eight) hours as needed for dizziness.    [provider]  melatonin 5 MG TABS Take 5 mg by mouth at bedtime.    [provider]  montelukast (SINGULAIR) 10 MG tablet Take 10 mg by mouth at bedtime. 02/13/21   [provider]  multivitamin-lutein (OCUVITE-LUTEIN) CAPS capsule Take 1 capsule by mouth daily.    [provider]  olmesartan (BENICAR) 40 MG tablet Take 40 mg by mouth daily. 02/21/21   [provider]  oxybutynin (DITROPAN-XL) 10 MG 24 hr tablet Take 10 mg by mouth daily.    [provider]  pantoprazole (PROTONIX) 40 MG tablet Take 40 mg by mouth daily.    [provider]  PRESCRIPTION MEDICATION CPAP- At bedtime    [provider]  promethazine (PHENERGAN) 25 MG tablet Take 25 mg by mouth every 6 (six) hours as needed for vomiting or nausea. 12/22/17   [provider]  TRELEGY ELLIPTA 100-62.5-25 MCG/ACT AEPB Inhale 1 puff into the lungs daily.    [provider]  verapamil (CALAN-SR) 120 MG CR tablet Take 360 mg by mouth daily. 02/13/21   [provider]      Allergies    Tetanus-diphtheria toxoids td, Meloxicam, Phentermine, Sulfa antibiotics, and Zithromax [azithromycin]    Review of Systems   Review of Systems  Neurological:  Positive for headaches.    Physical Exam Updated Vital Signs BP 134/70 (BP Location: Left Arm)   Pulse 62   Temp (!) 97.2 F (36.2 C)   Resp 18   Ht 5\' 2"  (1.575 m)   Wt 92.1 kg   SpO2 94%   BMI 37.13 kg/m  Physical Exam Vitals and nursing note reviewed.  Constitutional:      General: She is not in acute distress.    Appearance: She is well-developed.  HENT:     Head: Normocephalic and atraumatic.  Eyes:     Extraocular Movements: Extraocular movements intact.     Conjunctiva/sclera: Conjunctivae normal.     Pupils: Pupils are equal, round, and reactive to light.  Neck:   Cardiovascular:     Rate and Rhythm: Normal rate and regular rhythm.      Pulses: Normal pulses.     Heart sounds: Normal heart sounds. No murmur heard.    No friction rub.  Pulmonary:     Effort: Pulmonary effort is normal.     Breath sounds: Normal breath sounds. No wheezing or rales.  Abdominal:     General: Bowel sounds are normal. There is no distension.     Palpations: Abdomen is soft.     Tenderness: There is no abdominal tenderness. There is no guarding or rebound.  Musculoskeletal:        General: No tenderness. Normal range of motion.  Cervical back: Muscular tenderness present. No spinous process tenderness.     Comments: No edema  Skin:    General: Skin is warm and dry.     Findings: No rash.  Neurological:     Mental Status: She is alert and oriented to person, place, and time.     Cranial Nerves: No cranial nerve deficit.     Sensory: No sensory deficit.     Motor: No weakness.  Psychiatric:        Behavior: Behavior normal.     ED Results / Procedures / Treatments   Labs (all labs ordered are listed, but only abnormal results are displayed) Labs Reviewed - No data to display  EKG None  Radiology CT Head Wo Contrast  Result Date: 06/26/2023 CLINICAL DATA:  Head trauma, minor (Age >= 65y) Headache since falling and hitting head 5 days ago. EXAM: CT HEAD WITHOUT CONTRAST TECHNIQUE: Contiguous axial images were obtained from the base of the skull through the vertex without intravenous contrast. RADIATION DOSE REDUCTION: This exam was performed according to the departmental dose-optimization program which includes automated exposure control, adjustment of the mA and/or kV according to patient size and/or use of iterative reconstruction technique. COMPARISON:  Head CT 04/23/2023 FINDINGS: Brain: No evidence of acute infarction, hemorrhage, hydrocephalus, extra-axial collection or mass lesion/mass effect. Stable degree of atrophy. Stable periventricular and deep white matter hypodensity typical of chronic small vessel ischemia. Remote  right basal gangliar lacunar infarcts. Vascular: No hyperdense vessel or unexpected calcification. Skull: No fracture or focal lesion. Sinuses/Orbits: Paranasal sinuses and mastoid air cells are clear. The visualized orbits are unremarkable. Other: None. IMPRESSION: 1. No acute intracranial abnormality. 2. Stable atrophy and chronic small vessel ischemia. Remote right basal gangliar lacunar infarcts. Electronically Signed   By: Narda Rutherford M.D.   On: 06/26/2023 21:44    Procedures Procedures    Medications Ordered in ED Medications - No data to display  ED Course/ Medical Decision Making/ A&P                                 Medical Decision Making Amount and/or Complexity of Data Reviewed Radiology: ordered and independent interpretation performed. Decision-making details documented in ED Course.   Pt with multiple medical problems and comorbidities and presenting today with a complaint that caries a high risk for morbidity and mortality.  Here today after a fall 5 days ago with persistent headache.  No other associated symptoms.  Will do head CT to rule out intracranial hemorrhage but patient also could have a concussion.  Patient is otherwise eating and drinking normally and has no other associated symptoms and do not feel that she requires lab work today.  Vital signs are reassuring.  Husband is with the patient and confirms her story. I have independently visualized and interpreted pt's images today.  Head CT without evidence of bleed.  Radiology reports stable atrophy and chronic small vessel ischemia.  Findings discussed with the patient and her husband.  At this time no indication for further testing or admission.  Patient is stable for discharge.         Final Clinical Impression(s) / ED Diagnoses Final diagnoses:  Concussion without loss of consciousness, initial encounter    Rx / DC Orders ED Discharge Orders     None         Gwyneth Sprout, MD 06/26/23  2205

## 2023-06-27 DIAGNOSIS — S060X0A Concussion without loss of consciousness, initial encounter: Secondary | ICD-10-CM | POA: Diagnosis not present

## 2023-07-17 DIAGNOSIS — M6281 Muscle weakness (generalized): Secondary | ICD-10-CM | POA: Diagnosis not present

## 2023-07-17 DIAGNOSIS — R2689 Other abnormalities of gait and mobility: Secondary | ICD-10-CM | POA: Diagnosis not present

## 2023-07-17 DIAGNOSIS — R269 Unspecified abnormalities of gait and mobility: Secondary | ICD-10-CM | POA: Diagnosis not present

## 2023-07-17 DIAGNOSIS — Z4889 Encounter for other specified surgical aftercare: Secondary | ICD-10-CM | POA: Diagnosis not present

## 2023-07-23 DIAGNOSIS — R2689 Other abnormalities of gait and mobility: Secondary | ICD-10-CM | POA: Diagnosis not present

## 2023-07-23 DIAGNOSIS — Z4889 Encounter for other specified surgical aftercare: Secondary | ICD-10-CM | POA: Diagnosis not present

## 2023-07-23 DIAGNOSIS — R269 Unspecified abnormalities of gait and mobility: Secondary | ICD-10-CM | POA: Diagnosis not present

## 2023-07-23 DIAGNOSIS — M6281 Muscle weakness (generalized): Secondary | ICD-10-CM | POA: Diagnosis not present

## 2023-07-28 DIAGNOSIS — Z4889 Encounter for other specified surgical aftercare: Secondary | ICD-10-CM | POA: Diagnosis not present

## 2023-07-28 DIAGNOSIS — M6281 Muscle weakness (generalized): Secondary | ICD-10-CM | POA: Diagnosis not present

## 2023-07-28 DIAGNOSIS — R269 Unspecified abnormalities of gait and mobility: Secondary | ICD-10-CM | POA: Diagnosis not present

## 2023-07-28 DIAGNOSIS — R2689 Other abnormalities of gait and mobility: Secondary | ICD-10-CM | POA: Diagnosis not present

## 2023-07-30 DIAGNOSIS — R2689 Other abnormalities of gait and mobility: Secondary | ICD-10-CM | POA: Diagnosis not present

## 2023-07-30 DIAGNOSIS — M6281 Muscle weakness (generalized): Secondary | ICD-10-CM | POA: Diagnosis not present

## 2023-07-30 DIAGNOSIS — R269 Unspecified abnormalities of gait and mobility: Secondary | ICD-10-CM | POA: Diagnosis not present

## 2023-07-30 DIAGNOSIS — Z4889 Encounter for other specified surgical aftercare: Secondary | ICD-10-CM | POA: Diagnosis not present

## 2023-08-04 DIAGNOSIS — Z4889 Encounter for other specified surgical aftercare: Secondary | ICD-10-CM | POA: Diagnosis not present

## 2023-08-04 DIAGNOSIS — R2689 Other abnormalities of gait and mobility: Secondary | ICD-10-CM | POA: Diagnosis not present

## 2023-08-04 DIAGNOSIS — M6281 Muscle weakness (generalized): Secondary | ICD-10-CM | POA: Diagnosis not present

## 2023-08-04 DIAGNOSIS — R269 Unspecified abnormalities of gait and mobility: Secondary | ICD-10-CM | POA: Diagnosis not present

## 2023-08-06 DIAGNOSIS — M6281 Muscle weakness (generalized): Secondary | ICD-10-CM | POA: Diagnosis not present

## 2023-08-06 DIAGNOSIS — R269 Unspecified abnormalities of gait and mobility: Secondary | ICD-10-CM | POA: Diagnosis not present

## 2023-08-06 DIAGNOSIS — R2689 Other abnormalities of gait and mobility: Secondary | ICD-10-CM | POA: Diagnosis not present

## 2023-08-06 DIAGNOSIS — Z4889 Encounter for other specified surgical aftercare: Secondary | ICD-10-CM | POA: Diagnosis not present

## 2023-08-07 DIAGNOSIS — N3941 Urge incontinence: Secondary | ICD-10-CM | POA: Diagnosis not present

## 2023-08-07 DIAGNOSIS — R350 Frequency of micturition: Secondary | ICD-10-CM | POA: Diagnosis not present

## 2023-08-13 DIAGNOSIS — M6281 Muscle weakness (generalized): Secondary | ICD-10-CM | POA: Diagnosis not present

## 2023-08-13 DIAGNOSIS — R269 Unspecified abnormalities of gait and mobility: Secondary | ICD-10-CM | POA: Diagnosis not present

## 2023-08-13 DIAGNOSIS — Z4889 Encounter for other specified surgical aftercare: Secondary | ICD-10-CM | POA: Diagnosis not present

## 2023-08-13 DIAGNOSIS — R2689 Other abnormalities of gait and mobility: Secondary | ICD-10-CM | POA: Diagnosis not present

## 2023-08-15 DIAGNOSIS — Z4889 Encounter for other specified surgical aftercare: Secondary | ICD-10-CM | POA: Diagnosis not present

## 2023-08-15 DIAGNOSIS — R2689 Other abnormalities of gait and mobility: Secondary | ICD-10-CM | POA: Diagnosis not present

## 2023-08-15 DIAGNOSIS — R269 Unspecified abnormalities of gait and mobility: Secondary | ICD-10-CM | POA: Diagnosis not present

## 2023-08-15 DIAGNOSIS — M6281 Muscle weakness (generalized): Secondary | ICD-10-CM | POA: Diagnosis not present

## 2023-08-20 DIAGNOSIS — Z4889 Encounter for other specified surgical aftercare: Secondary | ICD-10-CM | POA: Diagnosis not present

## 2023-08-20 DIAGNOSIS — R2689 Other abnormalities of gait and mobility: Secondary | ICD-10-CM | POA: Diagnosis not present

## 2023-08-20 DIAGNOSIS — M6281 Muscle weakness (generalized): Secondary | ICD-10-CM | POA: Diagnosis not present

## 2023-08-20 DIAGNOSIS — R269 Unspecified abnormalities of gait and mobility: Secondary | ICD-10-CM | POA: Diagnosis not present

## 2023-08-22 DIAGNOSIS — Z4889 Encounter for other specified surgical aftercare: Secondary | ICD-10-CM | POA: Diagnosis not present

## 2023-08-22 DIAGNOSIS — M6281 Muscle weakness (generalized): Secondary | ICD-10-CM | POA: Diagnosis not present

## 2023-08-22 DIAGNOSIS — R2689 Other abnormalities of gait and mobility: Secondary | ICD-10-CM | POA: Diagnosis not present

## 2023-08-22 DIAGNOSIS — R269 Unspecified abnormalities of gait and mobility: Secondary | ICD-10-CM | POA: Diagnosis not present

## 2023-08-25 DIAGNOSIS — M6281 Muscle weakness (generalized): Secondary | ICD-10-CM | POA: Diagnosis not present

## 2023-08-25 DIAGNOSIS — R269 Unspecified abnormalities of gait and mobility: Secondary | ICD-10-CM | POA: Diagnosis not present

## 2023-08-25 DIAGNOSIS — R2689 Other abnormalities of gait and mobility: Secondary | ICD-10-CM | POA: Diagnosis not present

## 2023-08-25 DIAGNOSIS — Z4889 Encounter for other specified surgical aftercare: Secondary | ICD-10-CM | POA: Diagnosis not present

## 2023-08-27 DIAGNOSIS — Z4889 Encounter for other specified surgical aftercare: Secondary | ICD-10-CM | POA: Diagnosis not present

## 2023-08-27 DIAGNOSIS — R269 Unspecified abnormalities of gait and mobility: Secondary | ICD-10-CM | POA: Diagnosis not present

## 2023-08-27 DIAGNOSIS — R2689 Other abnormalities of gait and mobility: Secondary | ICD-10-CM | POA: Diagnosis not present

## 2023-08-27 DIAGNOSIS — M6281 Muscle weakness (generalized): Secondary | ICD-10-CM | POA: Diagnosis not present

## 2023-09-01 DIAGNOSIS — R269 Unspecified abnormalities of gait and mobility: Secondary | ICD-10-CM | POA: Diagnosis not present

## 2023-09-01 DIAGNOSIS — Z4889 Encounter for other specified surgical aftercare: Secondary | ICD-10-CM | POA: Diagnosis not present

## 2023-09-01 DIAGNOSIS — M6281 Muscle weakness (generalized): Secondary | ICD-10-CM | POA: Diagnosis not present

## 2023-09-01 DIAGNOSIS — R2689 Other abnormalities of gait and mobility: Secondary | ICD-10-CM | POA: Diagnosis not present

## 2023-09-03 ENCOUNTER — Encounter (HOSPITAL_COMMUNITY): Payer: Self-pay | Admitting: Pharmacy Technician

## 2023-09-03 ENCOUNTER — Emergency Department (HOSPITAL_COMMUNITY): Payer: Medicare HMO

## 2023-09-03 ENCOUNTER — Emergency Department (HOSPITAL_COMMUNITY)
Admission: EM | Admit: 2023-09-03 | Discharge: 2023-09-03 | Disposition: A | Discharge status: Home / Self Care | Payer: Medicare HMO

## 2023-09-03 ENCOUNTER — Other Ambulatory Visit: Payer: Self-pay

## 2023-09-03 DIAGNOSIS — Z7982 Long term (current) use of aspirin: Secondary | ICD-10-CM | POA: Diagnosis not present

## 2023-09-03 DIAGNOSIS — R6883 Chills (without fever): Secondary | ICD-10-CM | POA: Diagnosis not present

## 2023-09-03 DIAGNOSIS — K5730 Diverticulosis of large intestine without perforation or abscess without bleeding: Secondary | ICD-10-CM | POA: Diagnosis not present

## 2023-09-03 DIAGNOSIS — R10819 Abdominal tenderness, unspecified site: Secondary | ICD-10-CM | POA: Insufficient documentation

## 2023-09-03 DIAGNOSIS — B349 Viral infection, unspecified: Secondary | ICD-10-CM | POA: Insufficient documentation

## 2023-09-03 DIAGNOSIS — R059 Cough, unspecified: Secondary | ICD-10-CM | POA: Diagnosis not present

## 2023-09-03 DIAGNOSIS — J9811 Atelectasis: Secondary | ICD-10-CM | POA: Diagnosis not present

## 2023-09-03 DIAGNOSIS — I10 Essential (primary) hypertension: Secondary | ICD-10-CM | POA: Diagnosis not present

## 2023-09-03 DIAGNOSIS — R9389 Abnormal findings on diagnostic imaging of other specified body structures: Secondary | ICD-10-CM | POA: Diagnosis not present

## 2023-09-03 DIAGNOSIS — Z1152 Encounter for screening for COVID-19: Secondary | ICD-10-CM | POA: Insufficient documentation

## 2023-09-03 DIAGNOSIS — R531 Weakness: Secondary | ICD-10-CM | POA: Diagnosis present

## 2023-09-03 DIAGNOSIS — R0789 Other chest pain: Secondary | ICD-10-CM | POA: Diagnosis not present

## 2023-09-03 DIAGNOSIS — Z981 Arthrodesis status: Secondary | ICD-10-CM | POA: Diagnosis not present

## 2023-09-03 LAB — COMPREHENSIVE METABOLIC PANEL
ALT: 15 U/L (ref 0–44)
AST: 17 U/L (ref 15–41)
Albumin: 3.4 g/dL — ABNORMAL LOW (ref 3.5–5.0)
Alkaline Phosphatase: 63 U/L (ref 38–126)
Anion gap: 8 (ref 5–15)
BUN: 11 mg/dL (ref 8–23)
CO2: 28 mmol/L (ref 22–32)
Calcium: 9.2 mg/dL (ref 8.9–10.3)
Chloride: 106 mmol/L (ref 98–111)
Creatinine, Ser: 0.71 mg/dL (ref 0.44–1.00)
GFR, Estimated: >60 mL/min (ref >60)
Glucose, Bld: 85 mg/dL (ref 70–99)
Potassium: 4 mmol/L (ref 3.5–5.1)
Sodium: 142 mmol/L (ref 135–145)
Total Bilirubin: 0.4 mg/dL (ref <1.2)
Total Protein: 6.2 g/dL — ABNORMAL LOW (ref 6.5–8.1)

## 2023-09-03 LAB — RESP PANEL BY RT-PCR (RSV, FLU A&B, COVID)  RVPGX2
Influenza A by PCR: NEGATIVE
Influenza B by PCR: NEGATIVE
Resp Syncytial Virus by PCR: NEGATIVE
SARS Coronavirus 2 by RT PCR: NEGATIVE

## 2023-09-03 LAB — TROPONIN I (HIGH SENSITIVITY)
Troponin I (High Sensitivity): 7 ng/L (ref <18)
Troponin I (High Sensitivity): 6 ng/L (ref <18)

## 2023-09-03 LAB — CBC
HCT: 41.6 % (ref 36.0–46.0)
Hemoglobin: 13 g/dL (ref 12.0–15.0)
MCH: 29.3 pg (ref 26.0–34.0)
MCHC: 31.3 g/dL (ref 30.0–36.0)
MCV: 93.9 fL (ref 80.0–100.0)
Platelets: 244 10*3/uL (ref 150–400)
RBC: 4.43 MIL/uL (ref 3.87–5.11)
RDW: 13.4 % (ref 11.5–15.5)
WBC: 6.5 10*3/uL (ref 4.0–10.5)
nRBC: 0 % (ref 0.0–0.2)

## 2023-09-03 LAB — URINALYSIS, ROUTINE W REFLEX MICROSCOPIC
Bilirubin Urine: NEGATIVE
Glucose, UA: NEGATIVE mg/dL
Hgb urine dipstick: NEGATIVE
Ketones, ur: NEGATIVE mg/dL
Leukocytes,Ua: NEGATIVE
Nitrite: NEGATIVE
Protein, ur: NEGATIVE mg/dL
Specific Gravity, Urine: 1.01 (ref 1.005–1.030)
pH: 8 (ref 5.0–8.0)

## 2023-09-03 LAB — LIPASE, BLOOD: Lipase: 21 U/L (ref 11–51)

## 2023-09-03 MED ORDER — SODIUM CHLORIDE 0.9 % IV BOLUS
500.0000 mL | Freq: Once | INTRAVENOUS | Status: AC
  Administered 2023-09-03: 500 mL via INTRAVENOUS

## 2023-09-03 MED ORDER — IOHEXOL 300 MG/ML  SOLN
75.0000 mL | Freq: Once | INTRAMUSCULAR | Status: AC | PRN
  Administered 2023-09-03: 75 mL via INTRAVENOUS

## 2023-09-03 NOTE — Discharge Instructions (Signed)
He may take over-the-counter Tylenol turning with Motrin for malaise.  Please return immediately if develop fevers, chills, worsening chest pain, shortness of breath, worsening abdominal pain, inability to eat or drink due to nausea vomiting.  He stop having bowel movements.  Become lightheaded, passout, or he develop any new or worsening symptoms that are concerning to you.  Please follow-up with your primary doctor as soon as possible.

## 2023-09-03 NOTE — ED Provider Notes (Signed)
[place] EMERGENCY DEPARTMENT AT [facility] Provider Note   CSN: [id number] Arrival date & time: 09/03/23  1544     History  Chief Complaint  Patient presents with   Flu Like Symptoms    Courtney [name] is a 77 y.o. female.  Is a 77 year old female presenting from independent living with generalized malaise and weakness for the past 2 days.  Has cough for same duration.  No nausea, but decreased p.o. intake secondary to no appetite.  Has developed left sided abdominal pain and left lower chest pain today.  States that this feels similar to prior diverticulitis that she has had.  Still having normal bowel movements.        Home Medications Prior to Admission medications   Medication Sig Start Date End Date Taking? Authorizing Provider  albuterol (VENTOLIN HFA) 108 (90 Base) MCG/ACT inhaler Inhale 1 puff into the lungs every 6 (six) hours as needed for wheezing or shortness of breath. 02/22/21   [provider]  aspirin 325 MG tablet Take 325 mg by mouth daily.    [provider]  atorvastatin (LIPITOR) 80 MG tablet Take 80 mg by mouth every evening. 02/21/21   [provider]  azelastine (ASTELIN) 0.1 % nasal spray Place 1 spray into both nostrils 2 (two) times daily.    [provider]  Biotin 5000 MCG CAPS Take 5,000 mcg by mouth daily.    [provider]  buPROPion (WELLBUTRIN SR) 150 MG 12 hr tablet Take 150 mg by mouth daily.    [provider]  Calcium Carb-Cholecalciferol (CALCIUM 600/VITAMIN D PO) Take 1 tablet by mouth daily.    [provider]  Cholecalciferol (VITAMIN D3) 25 MCG (1000 UT) CAPS Take 1,000 Units by mouth daily.    [provider]  cyanocobalamin (,VITAMIN B-12,) 1000 MCG/ML injection Inject 1,000 mcg into the muscle every 30 (thirty) days. 02/23/21   [provider]  diclofenac Sodium (VOLTAREN) 1 % GEL Apply 2 g topically 4 (four) times daily as needed (pain). 12/02/20    [provider]  docusate sodium (COLACE) 100 MG capsule Take 100 mg by mouth daily.    [provider]  escitalopram (LEXAPRO) 10 MG tablet Take 10 mg by mouth daily. 11/24/20   [provider]  gabapentin (NEURONTIN) 100 MG capsule Take 100 mg by mouth in the morning, at noon, and at bedtime. 02/13/21   [provider]  HYDROcodone-acetaminophen (NORCO/VICODIN) 5-325 MG tablet Take 1 tablet by mouth every 4 (four) hours as needed for moderate pain. 06/04/23 06/03/24  Val Eagle D, NP  ipratropium (ATROVENT) 0.03 % nasal spray Place 1 spray into both nostrils daily. 04/01/23 03/31/24  [provider]  levothyroxine (SYNTHROID) 50 MCG tablet Take 50 mcg by mouth at bedtime. 02/09/21   [provider]  LORazepam (ATIVAN) 1 MG tablet Take 1 tablet (1 mg total) by mouth at bedtime. 01/03/22   Tyrone Nine, MD  magnesium oxide (MAG-OX) 400 (240 Mg) MG tablet Take 400 mg by mouth daily.    [provider]  meclizine (ANTIVERT) 25 MG tablet Take 25 mg by mouth every 8 (eight) hours as needed for dizziness.    [provider]  melatonin 5 MG TABS Take 5 mg by mouth at bedtime.    [provider]  montelukast (SINGULAIR) 10 MG tablet Take 10 mg by mouth at bedtime. 02/13/21   [provider]  multivitamin-lutein (OCUVITE-LUTEIN) CAPS capsule Take 1  capsule by mouth daily.    [provider]  olmesartan (BENICAR) 40 MG tablet Take 40 mg by mouth daily. 02/21/21   [provider]  oxybutynin (DITROPAN-XL) 10 MG 24 hr tablet Take 10 mg by mouth daily.    [provider]  pantoprazole (PROTONIX) 40 MG tablet Take 40 mg by mouth daily.    [provider]  PRESCRIPTION MEDICATION CPAP- At bedtime    [provider]  promethazine (PHENERGAN) 25 MG tablet Take 25 mg by mouth every 6 (six) hours as needed for vomiting or nausea. 12/22/17   [provider]  TRELEGY ELLIPTA  100-62.5-25 MCG/ACT AEPB Inhale 1 puff into the lungs daily.    [provider]  verapamil (CALAN-SR) 120 MG CR tablet Take 360 mg by mouth daily. 02/13/21   [provider]      Allergies    Tetanus-diphtheria toxoids td, Meloxicam, Phentermine, Sulfa antibiotics, and Zithromax [azithromycin]    Review of Systems   Review of Systems  Physical Exam Updated Vital Signs BP (!) 181/77   Pulse 92   Temp 98.9 F (37.2 C)   Resp 19   SpO2 98%  Physical Exam Vitals and nursing note reviewed.  Constitutional:      General: She is not in acute distress.    Appearance: She is obese. She is not toxic-appearing.  HENT:     Mouth/Throat:     Mouth: Mucous membranes are dry.  Eyes:     Conjunctiva/sclera: Conjunctivae normal.  Cardiovascular:     Rate and Rhythm: Normal rate and regular rhythm.  Pulmonary:     Effort: Pulmonary effort is normal.  Abdominal:     General: Abdomen is flat.     Palpations: Abdomen is soft.     Tenderness: There is abdominal tenderness (left sided). There is no guarding or rebound.  Musculoskeletal:     Right lower leg: No edema.     Left lower leg: No edema.  Skin:    General: Skin is warm and dry.     Capillary Refill: Capillary refill takes less than 2 seconds.  Neurological:     Mental Status: She is alert and oriented to person, place, and time.  Psychiatric:        Mood and Affect: Mood normal.        Behavior: Behavior normal.     ED Results / Procedures / Treatments   Labs (all labs ordered are listed, but only abnormal results are displayed) Labs Reviewed  COMPREHENSIVE METABOLIC PANEL - Abnormal; Notable for the following components:      Result Value   Total Protein 6.2 (*)    Albumin 3.4 (*)    All other components within normal limits  URINALYSIS, ROUTINE W REFLEX MICROSCOPIC - Abnormal; Notable for the following components:   APPearance CLOUDY (*)    All other components within normal limits  RESP PANEL BY  RT-PCR (RSV, FLU A&B, COVID)  RVPGX2  CBC  LIPASE, BLOOD  TROPONIN I (HIGH SENSITIVITY)  TROPONIN I (HIGH SENSITIVITY)    EKG EKG Interpretation Date/Time:  Wednesday September 03 2023 16:25:17 EST Ventricular Rate:  79 PR Interval:  162 QRS Duration:  97 QT Interval:  396 QTC Calculation: 454 R Axis:   46  Text Interpretation: Sinus rhythm Ventricular premature complex Abnormal R-wave progression, early transition Confirmed by Estanislado Pandy [phone]) on 09/03/2023 5:14:50 PM  Radiology CT ABDOMEN PELVIS W CONTRAST  Result Date: 09/03/2023 CLINICAL DATA:  Chills,  generalized weakness, malaise. Diverticulitis, complication suspected EXAM: CT ABDOMEN AND PELVIS WITH CONTRAST TECHNIQUE: Multidetector CT imaging of the abdomen and pelvis was performed using the standard protocol following bolus administration of intravenous contrast. RADIATION DOSE REDUCTION: This exam was performed according to the departmental dose-optimization program which includes automated exposure control, adjustment of the mA and/or kV according to patient size and/or use of iterative reconstruction technique. CONTRAST:  75mL OMNIPAQUE IOHEXOL 300 MG/ML  SOLN COMPARISON:  12/09/2021 FINDINGS: Lower chest: Elevation of the right hemidiaphragm, stable. No acute abnormality. Hepatobiliary: Prior cholecystectomy. No intrahepatic or extrahepatic biliary ductal dilatation. No focal hepatic abnormality. Pancreas: No focal abnormality or ductal dilatation. Spleen: No focal abnormality.  Normal size. Adrenals/Urinary Tract: Stomach/Bowel: Diffuse colonic diverticulosis. No active diverticulitis. Stomach and small bowel decompressed, unremarkable. Vascular/Lymphatic: Aortic atherosclerosis. No evidence of aneurysm or adenopathy. Reproductive: Prior hysterectomy.  No adnexal masses. Other: No free fluid or free air. Musculoskeletal: Postoperative and degenerative changes in the lumbar spine. No acute bony abnormality. IMPRESSION: No  acute findings in the abdomen or pelvis. Diffuse colonic diverticulosis.  No active diverticulitis. Aortic atherosclerosis. Electronically Signed   By: Charlett Nose M.D.   On: 09/03/2023 20:11   DG Chest 2 View  Result Date: 09/03/2023 CLINICAL DATA:  Cough EXAM: CHEST - 2 VIEW COMPARISON:  01/02/2022 FINDINGS: Stable cardiomediastinal contours. Chronic elevation of the right hemidiaphragm with right basilar atelectasis. Otherwise, no focal airspace consolidation. No pleural effusion or pneumothorax. Cervical ACDF. IMPRESSION: Chronic elevation of the right hemidiaphragm with right basilar atelectasis. Otherwise, no acute cardiopulmonary findings. Electronically Signed   By: Duanne Guess D.O.   On: 09/03/2023 18:31    Procedures Procedures    Medications Ordered in ED Medications  sodium chloride 0.9 % bolus 500 mL (0 mLs Intravenous Stopped 09/03/23 2027)  iohexol (OMNIPAQUE) 300 MG/ML solution 75 mL (75 mLs Intravenous Contrast Given 09/03/23 1906)    ED Course/ Medical Decision Making/ A&P Clinical Course as of 09/03/23 2031  Wed Sep 03, 2023  1647 CBC No leukocytosis to suggest systemic infection.  No anemia noted  that would explain her generalized weakness. [TY]  1725 Comprehensive metabolic panel(!) No significant metabolic derangements.  Normal kidney function.  No transaminitis to suggest hepatobiliary disease. [TY]  1726 Lipase: 21 Pancreatitis unlikely [TY]  1726 Troponin I (High Sensitivity): 7 ACS less likely given symptoms been ongoing for the past several days. [TY]  1730 Urinalysis, Routine w reflex microscopic -Urine, Clean Catch(!) Not consistent with urinary tract infection. [TY]  1835 DG Chest 2 View IMPRESSION: Chronic elevation of the right hemidiaphragm with right basilar atelectasis. Otherwise, no acute cardiopulmonary findings.   [TY]  1953 Resp panel by RT-PCR (RSV, Flu A&B, Covid) Anterior Nasal Swab Negative flu/COVID/RSV [TY]  2021 CT ABDOMEN  PELVIS W CONTRAST IMPRESSION: No acute findings in the abdomen or pelvis.  Diffuse colonic diverticulosis.  No active diverticulitis.  Aortic atherosclerosis   [TY]  2028 Patient feeling improved after fluids and food.  Tolerating p.o.  Workup largely reassuring.  As noted in ED course.  Suspect viral etiology.  Discussed supportive care and as I do not feel that she would benefit from admission at this point, discussed follow-up with PCP.  Patient is agreeable to plan.  She is stable for discharge.  Return precautions given. [TY]    Clinical Course User Index [TY] Coral Spikes, DO  Medical Decision Making This is a 77 year old female with complicated past medical history to include prior stroke, mitral valve prolapse, prior diverticulitis, kidney stones hypertension hypothyroid, CAD.  Per chart review has prior cholecystectomy and hysterectomy.  Recent urinary tract infection treated with antibiotics.  Presents today with vague viral type complaints with generalized malaise and weakness.  Clinically with no localizing neurodeficits.  Some mild left-sided abdominal tenderness on exam.  Given patient's vague URI type complaints will test for flu/COVID/RSV.  Clinically with some dry mucous membranes, question mild dehydration.  Will get basic labs to screen for electrolyte disturbances and acute kidney injury.  UA as well as she does have have history of recurrent UTI.  Left side secondary to pyelonephritis versus diverticulitis?  Will get CT scan to further evaluate.  Low suspicion for ACS given her presentation, will get EKG and troponin however have abundance of caution.  See ED course for further MDM and final disposition.  Amount and/or Complexity of Data Reviewed Independent Historian:     Details: Husband notes at discharge that they have Phenergan at home for nausea and patient currently denying nausea and tolerating p.o. Labs: ordered. Decision-making  details documented in ED Course. Radiology: ordered. Decision-making details documented in ED Course. ECG/medicine tests: ordered. Decision-making details documented in ED Course.  Risk Prescription drug management. Decision regarding hospitalization.          Final Clinical Impression(s) / ED Diagnoses Final diagnoses:  Viral syndrome    Rx / DC Orders ED Discharge Orders     None         Coral Spikes, DO 09/03/23 2031

## 2023-09-03 NOTE — ED Triage Notes (Signed)
Pt here via ems from [facility] with reports of chills, generalized weakness, malaise for the last week. Pt developed a cough two days ago. Today pt with chest pain. VSS with ems. On site nurse did a rapid covid test prior to transport here which came back negative.

## 2023-09-04 DIAGNOSIS — B349 Viral infection, unspecified: Secondary | ICD-10-CM | POA: Diagnosis not present

## 2023-09-10 DIAGNOSIS — G8190 Hemiplegia, unspecified affecting unspecified side: Secondary | ICD-10-CM | POA: Diagnosis not present

## 2023-09-10 DIAGNOSIS — J029 Acute pharyngitis, unspecified: Secondary | ICD-10-CM | POA: Diagnosis not present

## 2023-09-10 DIAGNOSIS — M25552 Pain in left hip: Secondary | ICD-10-CM | POA: Diagnosis not present

## 2023-09-10 DIAGNOSIS — Z9989 Dependence on other enabling machines and devices: Secondary | ICD-10-CM | POA: Diagnosis not present

## 2023-09-10 DIAGNOSIS — R102 Pelvic and perineal pain: Secondary | ICD-10-CM | POA: Diagnosis not present

## 2023-09-10 DIAGNOSIS — F331 Major depressive disorder, recurrent, moderate: Secondary | ICD-10-CM | POA: Diagnosis not present

## 2023-09-10 DIAGNOSIS — M8580 Other specified disorders of bone density and structure, unspecified site: Secondary | ICD-10-CM | POA: Diagnosis not present

## 2023-09-10 DIAGNOSIS — M25559 Pain in unspecified hip: Secondary | ICD-10-CM | POA: Diagnosis not present

## 2023-09-10 DIAGNOSIS — M1612 Unilateral primary osteoarthritis, left hip: Secondary | ICD-10-CM | POA: Diagnosis not present

## 2023-09-16 DIAGNOSIS — G5601 Carpal tunnel syndrome, right upper limb: Secondary | ICD-10-CM | POA: Diagnosis not present

## 2023-09-16 DIAGNOSIS — G5621 Lesion of ulnar nerve, right upper limb: Secondary | ICD-10-CM | POA: Diagnosis not present

## 2023-09-17 DIAGNOSIS — R2689 Other abnormalities of gait and mobility: Secondary | ICD-10-CM | POA: Diagnosis not present

## 2023-09-17 DIAGNOSIS — Z4889 Encounter for other specified surgical aftercare: Secondary | ICD-10-CM | POA: Diagnosis not present

## 2023-09-17 DIAGNOSIS — M6281 Muscle weakness (generalized): Secondary | ICD-10-CM | POA: Diagnosis not present

## 2023-09-17 DIAGNOSIS — R269 Unspecified abnormalities of gait and mobility: Secondary | ICD-10-CM | POA: Diagnosis not present

## 2023-09-22 DIAGNOSIS — R7303 Prediabetes: Secondary | ICD-10-CM | POA: Diagnosis not present

## 2023-09-22 DIAGNOSIS — Z23 Encounter for immunization: Secondary | ICD-10-CM | POA: Diagnosis not present

## 2023-09-22 DIAGNOSIS — I2510 Atherosclerotic heart disease of native coronary artery without angina pectoris: Secondary | ICD-10-CM | POA: Diagnosis not present

## 2023-09-22 DIAGNOSIS — I10 Essential (primary) hypertension: Secondary | ICD-10-CM | POA: Diagnosis not present

## 2023-09-22 DIAGNOSIS — G629 Polyneuropathy, unspecified: Secondary | ICD-10-CM | POA: Diagnosis not present

## 2023-09-22 DIAGNOSIS — I700 Atherosclerosis of aorta: Secondary | ICD-10-CM | POA: Diagnosis not present

## 2023-09-22 DIAGNOSIS — F331 Major depressive disorder, recurrent, moderate: Secondary | ICD-10-CM | POA: Diagnosis not present

## 2023-09-22 DIAGNOSIS — I5032 Chronic diastolic (congestive) heart failure: Secondary | ICD-10-CM | POA: Diagnosis not present

## 2023-09-22 DIAGNOSIS — G4733 Obstructive sleep apnea (adult) (pediatric): Secondary | ICD-10-CM | POA: Diagnosis not present

## 2023-09-23 DIAGNOSIS — Z4889 Encounter for other specified surgical aftercare: Secondary | ICD-10-CM | POA: Diagnosis not present

## 2023-09-23 DIAGNOSIS — R269 Unspecified abnormalities of gait and mobility: Secondary | ICD-10-CM | POA: Diagnosis not present

## 2023-09-23 DIAGNOSIS — R2689 Other abnormalities of gait and mobility: Secondary | ICD-10-CM | POA: Diagnosis not present

## 2023-09-23 DIAGNOSIS — M6281 Muscle weakness (generalized): Secondary | ICD-10-CM | POA: Diagnosis not present

## 2023-09-29 DIAGNOSIS — R2689 Other abnormalities of gait and mobility: Secondary | ICD-10-CM | POA: Diagnosis not present

## 2023-09-29 DIAGNOSIS — Z4889 Encounter for other specified surgical aftercare: Secondary | ICD-10-CM | POA: Diagnosis not present

## 2023-09-29 DIAGNOSIS — M6281 Muscle weakness (generalized): Secondary | ICD-10-CM | POA: Diagnosis not present

## 2023-09-29 DIAGNOSIS — R269 Unspecified abnormalities of gait and mobility: Secondary | ICD-10-CM | POA: Diagnosis not present

## 2023-09-30 DIAGNOSIS — N3941 Urge incontinence: Secondary | ICD-10-CM | POA: Diagnosis not present

## 2023-10-06 ENCOUNTER — Encounter (HOSPITAL_COMMUNITY): Payer: Self-pay

## 2023-10-06 ENCOUNTER — Other Ambulatory Visit: Payer: Self-pay

## 2023-10-06 ENCOUNTER — Emergency Department (HOSPITAL_COMMUNITY)
Admission: EM | Admit: 2023-10-06 | Discharge: 2023-10-07 | Disposition: A | Discharge status: Home / Self Care | Payer: Medicare HMO | Attending: Emergency Medicine | Admitting: Emergency Medicine

## 2023-10-06 DIAGNOSIS — S0990XA Unspecified injury of head, initial encounter: Secondary | ICD-10-CM | POA: Diagnosis not present

## 2023-10-06 DIAGNOSIS — W19XXXA Unspecified fall, initial encounter: Secondary | ICD-10-CM

## 2023-10-06 DIAGNOSIS — R55 Syncope and collapse: Secondary | ICD-10-CM | POA: Diagnosis not present

## 2023-10-06 DIAGNOSIS — Z7982 Long term (current) use of aspirin: Secondary | ICD-10-CM | POA: Insufficient documentation

## 2023-10-06 DIAGNOSIS — Z96651 Presence of right artificial knee joint: Secondary | ICD-10-CM | POA: Diagnosis not present

## 2023-10-06 DIAGNOSIS — W01198A Fall on same level from slipping, tripping and stumbling with subsequent striking against other object, initial encounter: Secondary | ICD-10-CM | POA: Insufficient documentation

## 2023-10-06 DIAGNOSIS — R42 Dizziness and giddiness: Secondary | ICD-10-CM | POA: Diagnosis not present

## 2023-10-06 DIAGNOSIS — Z043 Encounter for examination and observation following other accident: Secondary | ICD-10-CM | POA: Diagnosis not present

## 2023-10-06 DIAGNOSIS — S0003XA Contusion of scalp, initial encounter: Secondary | ICD-10-CM | POA: Insufficient documentation

## 2023-10-06 DIAGNOSIS — U071 COVID-19: Secondary | ICD-10-CM | POA: Diagnosis not present

## 2023-10-06 DIAGNOSIS — R001 Bradycardia, unspecified: Secondary | ICD-10-CM | POA: Diagnosis not present

## 2023-10-06 DIAGNOSIS — I959 Hypotension, unspecified: Secondary | ICD-10-CM | POA: Diagnosis not present

## 2023-10-06 DIAGNOSIS — M25531 Pain in right wrist: Secondary | ICD-10-CM

## 2023-10-06 DIAGNOSIS — G4489 Other headache syndrome: Secondary | ICD-10-CM | POA: Diagnosis not present

## 2023-10-06 NOTE — ED Triage Notes (Signed)
Patient arrives EMS for dizziness and fall. Patient state she has been taking over the counter medication due to a head cold. Patient states tonight she was ambulating to the bathroom and felt dizzy. Patient lost her balance and fell onto the bathroom floor. Patient hit the back of her head on the glass shower door. Patient has a hematoma to the back of her head. No active bleeding. No LOC. Patient takes a baby ASA daily. Patient states her dizziness and headache are worse since falling.   Hx of vertigo.

## 2023-10-07 ENCOUNTER — Emergency Department (HOSPITAL_COMMUNITY): Payer: Medicare HMO

## 2023-10-07 DIAGNOSIS — R55 Syncope and collapse: Secondary | ICD-10-CM | POA: Diagnosis not present

## 2023-10-07 DIAGNOSIS — M25531 Pain in right wrist: Secondary | ICD-10-CM | POA: Diagnosis not present

## 2023-10-07 DIAGNOSIS — Z043 Encounter for examination and observation following other accident: Secondary | ICD-10-CM | POA: Diagnosis not present

## 2023-10-07 DIAGNOSIS — S6981XA Other specified injuries of right wrist, hand and finger(s), initial encounter: Secondary | ICD-10-CM | POA: Diagnosis not present

## 2023-10-07 DIAGNOSIS — S0093XA Contusion of unspecified part of head, initial encounter: Secondary | ICD-10-CM | POA: Diagnosis not present

## 2023-10-07 DIAGNOSIS — Z96651 Presence of right artificial knee joint: Secondary | ICD-10-CM | POA: Diagnosis not present

## 2023-10-07 DIAGNOSIS — W19XXXA Unspecified fall, initial encounter: Secondary | ICD-10-CM | POA: Diagnosis not present

## 2023-10-07 LAB — RESP PANEL BY RT-PCR (RSV, FLU A&B, COVID)  RVPGX2
Influenza A by PCR: NEGATIVE
Influenza B by PCR: NEGATIVE
Resp Syncytial Virus by PCR: NEGATIVE
SARS Coronavirus 2 by RT PCR: POSITIVE — AB

## 2023-10-07 LAB — BASIC METABOLIC PANEL
Anion gap: 10 (ref 5–15)
BUN: 16 mg/dL (ref 8–23)
CO2: 30 mmol/L (ref 22–32)
Calcium: 9.1 mg/dL (ref 8.9–10.3)
Chloride: 97 mmol/L — ABNORMAL LOW (ref 98–111)
Creatinine, Ser: 0.94 mg/dL (ref 0.44–1.00)
GFR, Estimated: >60 mL/min (ref >60)
Glucose, Bld: 107 mg/dL — ABNORMAL HIGH (ref 70–99)
Potassium: 3.5 mmol/L (ref 3.5–5.1)
Sodium: 137 mmol/L (ref 135–145)

## 2023-10-07 LAB — CBC
HCT: 36.2 % (ref 36.0–46.0)
Hemoglobin: 11.4 g/dL — ABNORMAL LOW (ref 12.0–15.0)
MCH: 29.6 pg (ref 26.0–34.0)
MCHC: 31.5 g/dL (ref 30.0–36.0)
MCV: 94 fL (ref 80.0–100.0)
Platelets: 224 10*3/uL (ref 150–400)
RBC: 3.85 MIL/uL — ABNORMAL LOW (ref 3.87–5.11)
RDW: 13.6 % (ref 11.5–15.5)
WBC: 9.5 10*3/uL (ref 4.0–10.5)
nRBC: 0 % (ref 0.0–0.2)

## 2023-10-07 MED ORDER — ACETAMINOPHEN 325 MG PO TABS
650.0000 mg | ORAL_TABLET | Freq: Once | ORAL | Status: AC
  Administered 2023-10-07: 650 mg via ORAL
  Filled 2023-10-07: qty 2

## 2023-10-07 NOTE — Discharge Instructions (Addendum)
Your CT didn't show any traumatic findings.  There was the old stroke seen.  Your x-rays showed arthritis, but no fractures.

## 2023-10-07 NOTE — ED Provider Triage Note (Signed)
  Emergency Medicine Provider Triage Evaluation Note  MRN:  [id number]  Arrival date & time: 10/07/23    Medically screening exam initiated at 12:03 AM.   CC:   Near Syncope   HPI:  Courtney [name] is a 77 y.o. year-old female presents to the ED with chief complaint of fall at home.  She slipped and fell. She states that his happens occasionally.  She takes a baby aspirin daily.  No other anticoagulants.  Complains of right wrist pain and headache.  History provided by patient. ROS:  -As included in HPI PE:   Vitals:   10/06/23 2349  BP: (!) 149/105  Pulse: 71  Resp: 15  Temp: 98.2 F (36.8 C)  SpO2: 94%    Non-toxic appearing No respiratory distress  MDM:   Patient was informed that the remainder of the evaluation will be completed by another provider, this initial triage assessment does not replace that evaluation, and the importance of remaining in the ED until their evaluation is complete.    Roxy Horseman, PA-C 10/07/23 0004

## 2023-10-07 NOTE — ED Notes (Signed)
Pt left prior to VS.

## 2023-10-07 NOTE — ED Provider Notes (Signed)
[facility]PT [facility] Emergency Department Provider Note MRN:  [id number]  Arrival date & time: 10/07/23     Chief Complaint   Near Syncope   History of Present Illness   Courtney [name] is a 77 y.o. year-old female presents to the ED with chief complaint of fall and head injury.  She states that she slipped and fell backward hitting her head on the shower door.  She takes a baby aspirin.  She is not otherwise anticoagulated.  She complains of headache and right wrist pain.  She has also been having cold symptoms, but she denies any other symptoms now.  History provided by patient.   Review of Systems  Pertinent positive and negative review of systems noted in HPI.    Physical Exam   Vitals:   10/06/23 2349  BP: (!) 149/105  Pulse: 71  Resp: 15  Temp: 98.2 F (36.8 C)  SpO2: 94%    CONSTITUTIONAL:  non toxic-appearing, NAD NEURO:  Alert and oriented x 3, CN 3-12 grossly intact EYES:  eyes equal and reactive ENT/NECK:  Supple, no stridor, small hematoma on posterior scalp CARDIO:  normal rate, regular rhythm, appears well-perfused  PULM:  No respiratory distress, CTAB GI/GU:  non-distended,  MSK/SPINE:  No gross deformities, no edema, moves all extremities  SKIN:  no rash, atraumatic   *Additional and/or pertinent findings included in MDM below  Diagnostic and Interventional Summary    EKG Interpretation Date/Time:  Monday October 06 2023 23:46:47 EST Ventricular Rate:  73 PR Interval:  164 QRS Duration:  86 QT Interval:  392 QTC Calculation: 431 R Axis:   58  Text Interpretation: Sinus rhythm with occasional Premature ventricular complexes Nonspecific ST and T wave abnormality Abnormal ECG Confirmed by Zadie Rhine ([id number]) on 10/07/2023 12:57:55 AM       Labs Reviewed  RESP PANEL BY RT-PCR (RSV, FLU A&B, COVID)  RVPGX2 - Abnormal; Notable for the following components:      Result Value   SARS Coronavirus 2 by RT PCR POSITIVE (*)     All other components within normal limits  BASIC METABOLIC PANEL - Abnormal; Notable for the following components:   Chloride 97 (*)    Glucose, Bld 107 (*)    All other components within normal limits  CBC - Abnormal; Notable for the following components:   RBC 3.85 (*)    Hemoglobin 11.4 (*)    All other components within normal limits  URINALYSIS, ROUTINE W REFLEX MICROSCOPIC  CBG MONITORING, ED    DG Knee Complete 4 Views Right  Final Result    CT HEAD WO CONTRAST  Final Result    DG Wrist Complete Right  Final Result      Medications  acetaminophen (TYLENOL) tablet 650 mg (650 mg Oral Given 10/07/23 0349)     Procedures  /  Critical Care Procedures  ED Course and Medical Decision Making  I have reviewed the triage vital signs, the nursing notes, and pertinent available records from the EMR.  Social Determinants Affecting Complexity of Care: Patient has no clinically significant social determinants affecting this chief complaint..   ED Course: Clinical Course as of 10/07/23 0552  Tue Oct 07, 2023  0102 Imaging is reassuring.   [RB]  0551 Resp panel by RT-PCR (RSV, Flu A&B, Covid) Anterior Nasal Swab(!) COVID is positive, but patient has been sick for about week with worsening symptoms in the past 3 days.  We discussed treatment options, but after discussion,  patient elects supportive care at home. [RB]    Clinical Course User Index [RB] Roxy Horseman, PA-C    Medical Decision Making Patient here after a slip and fall tonight.  She hit the back of her head.  She is not anticoagulated.  She did not lose consciousness.  No vomiting, seizure, weakness, numbness.  She states that she slipped.  This has happened before.  She states that she has a headache and that her right wrist hurts.  She denies any other injuries.  Amount and/or Complexity of Data Reviewed Labs: ordered. Radiology: ordered and independent interpretation performed.    Details: No ICH  seen  Risk OTC drugs.         Consultants: No consultations were needed in caring for this patient.   Treatment and Plan: I considered admission due to patient's initial presentation, but after considering the examination and diagnostic results, patient will not require admission and can be discharged with outpatient follow-up.    Final Clinical Impressions(s) / ED Diagnoses     ICD-10-CM   1. Fall, initial encounter  W19.XXXA     2. Injury of head, initial encounter  S09.90XA     3. Right wrist pain  M25.531     4. COVID-19  U07.1       ED Discharge Orders     None         Discharge Instructions Discussed with and Provided to Patient:     Discharge Instructions      Your CT didn't show any traumatic findings.  There was the old stroke seen.  Your x-rays showed arthritis, but no fractures.       Roxy Horseman, PA-C 10/07/23 2979    Zadie Rhine, MD 10/07/23 [phone]

## 2023-10-13 DIAGNOSIS — J40 Bronchitis, not specified as acute or chronic: Secondary | ICD-10-CM | POA: Diagnosis not present

## 2023-10-13 DIAGNOSIS — R053 Chronic cough: Secondary | ICD-10-CM | POA: Diagnosis not present

## 2023-10-13 DIAGNOSIS — R051 Acute cough: Secondary | ICD-10-CM | POA: Diagnosis not present

## 2023-10-20 DIAGNOSIS — Z4889 Encounter for other specified surgical aftercare: Secondary | ICD-10-CM | POA: Diagnosis not present

## 2023-10-20 DIAGNOSIS — R2689 Other abnormalities of gait and mobility: Secondary | ICD-10-CM | POA: Diagnosis not present

## 2023-10-20 DIAGNOSIS — R269 Unspecified abnormalities of gait and mobility: Secondary | ICD-10-CM | POA: Diagnosis not present

## 2023-10-20 DIAGNOSIS — M6281 Muscle weakness (generalized): Secondary | ICD-10-CM | POA: Diagnosis not present

## 2023-10-21 DIAGNOSIS — M1711 Unilateral primary osteoarthritis, right knee: Secondary | ICD-10-CM | POA: Diagnosis not present

## 2023-10-21 DIAGNOSIS — M79671 Pain in right foot: Secondary | ICD-10-CM | POA: Diagnosis not present

## 2023-10-30 DIAGNOSIS — M7051 Other bursitis of knee, right knee: Secondary | ICD-10-CM | POA: Diagnosis not present

## 2023-10-30 DIAGNOSIS — R2689 Other abnormalities of gait and mobility: Secondary | ICD-10-CM | POA: Diagnosis not present

## 2023-10-30 DIAGNOSIS — R2681 Unsteadiness on feet: Secondary | ICD-10-CM | POA: Diagnosis not present

## 2023-10-30 DIAGNOSIS — M1711 Unilateral primary osteoarthritis, right knee: Secondary | ICD-10-CM | POA: Diagnosis not present

## 2023-10-30 DIAGNOSIS — M6281 Muscle weakness (generalized): Secondary | ICD-10-CM | POA: Diagnosis not present

## 2023-11-07 DIAGNOSIS — R059 Cough, unspecified: Secondary | ICD-10-CM | POA: Diagnosis not present

## 2023-11-07 DIAGNOSIS — I5032 Chronic diastolic (congestive) heart failure: Secondary | ICD-10-CM | POA: Diagnosis not present

## 2023-11-07 DIAGNOSIS — G8191 Hemiplegia, unspecified affecting right dominant side: Secondary | ICD-10-CM | POA: Diagnosis not present

## 2023-11-07 DIAGNOSIS — U071 COVID-19: Secondary | ICD-10-CM | POA: Diagnosis not present

## 2023-11-11 DIAGNOSIS — R2681 Unsteadiness on feet: Secondary | ICD-10-CM | POA: Diagnosis not present

## 2023-11-11 DIAGNOSIS — M1711 Unilateral primary osteoarthritis, right knee: Secondary | ICD-10-CM | POA: Diagnosis not present

## 2023-11-11 DIAGNOSIS — R2689 Other abnormalities of gait and mobility: Secondary | ICD-10-CM | POA: Diagnosis not present

## 2023-11-11 DIAGNOSIS — M6281 Muscle weakness (generalized): Secondary | ICD-10-CM | POA: Diagnosis not present

## 2023-11-11 DIAGNOSIS — M7051 Other bursitis of knee, right knee: Secondary | ICD-10-CM | POA: Diagnosis not present

## 2023-11-13 DIAGNOSIS — S92344D Nondisplaced fracture of fourth metatarsal bone, right foot, subsequent encounter for fracture with routine healing: Secondary | ICD-10-CM | POA: Diagnosis not present

## 2023-11-14 DIAGNOSIS — M1711 Unilateral primary osteoarthritis, right knee: Secondary | ICD-10-CM | POA: Diagnosis not present

## 2023-11-14 DIAGNOSIS — R2689 Other abnormalities of gait and mobility: Secondary | ICD-10-CM | POA: Diagnosis not present

## 2023-11-14 DIAGNOSIS — M6281 Muscle weakness (generalized): Secondary | ICD-10-CM | POA: Diagnosis not present

## 2023-11-14 DIAGNOSIS — M7051 Other bursitis of knee, right knee: Secondary | ICD-10-CM | POA: Diagnosis not present

## 2023-11-14 DIAGNOSIS — R2681 Unsteadiness on feet: Secondary | ICD-10-CM | POA: Diagnosis not present

## 2023-11-17 DIAGNOSIS — M7051 Other bursitis of knee, right knee: Secondary | ICD-10-CM | POA: Diagnosis not present

## 2023-11-17 DIAGNOSIS — M6281 Muscle weakness (generalized): Secondary | ICD-10-CM | POA: Diagnosis not present

## 2023-11-17 DIAGNOSIS — R2681 Unsteadiness on feet: Secondary | ICD-10-CM | POA: Diagnosis not present

## 2023-11-17 DIAGNOSIS — R2689 Other abnormalities of gait and mobility: Secondary | ICD-10-CM | POA: Diagnosis not present

## 2023-11-17 DIAGNOSIS — M1711 Unilateral primary osteoarthritis, right knee: Secondary | ICD-10-CM | POA: Diagnosis not present

## 2023-11-19 DIAGNOSIS — R2681 Unsteadiness on feet: Secondary | ICD-10-CM | POA: Diagnosis not present

## 2023-11-19 DIAGNOSIS — M7051 Other bursitis of knee, right knee: Secondary | ICD-10-CM | POA: Diagnosis not present

## 2023-11-19 DIAGNOSIS — M1711 Unilateral primary osteoarthritis, right knee: Secondary | ICD-10-CM | POA: Diagnosis not present

## 2023-11-19 DIAGNOSIS — M6281 Muscle weakness (generalized): Secondary | ICD-10-CM | POA: Diagnosis not present

## 2023-11-19 DIAGNOSIS — R2689 Other abnormalities of gait and mobility: Secondary | ICD-10-CM | POA: Diagnosis not present

## 2023-11-21 DIAGNOSIS — M7051 Other bursitis of knee, right knee: Secondary | ICD-10-CM | POA: Diagnosis not present

## 2023-11-21 DIAGNOSIS — M6281 Muscle weakness (generalized): Secondary | ICD-10-CM | POA: Diagnosis not present

## 2023-11-21 DIAGNOSIS — M1711 Unilateral primary osteoarthritis, right knee: Secondary | ICD-10-CM | POA: Diagnosis not present

## 2023-11-21 DIAGNOSIS — R2689 Other abnormalities of gait and mobility: Secondary | ICD-10-CM | POA: Diagnosis not present

## 2023-11-21 DIAGNOSIS — R2681 Unsteadiness on feet: Secondary | ICD-10-CM | POA: Diagnosis not present

## 2023-11-24 DIAGNOSIS — M6281 Muscle weakness (generalized): Secondary | ICD-10-CM | POA: Diagnosis not present

## 2023-11-24 DIAGNOSIS — R2681 Unsteadiness on feet: Secondary | ICD-10-CM | POA: Diagnosis not present

## 2023-11-24 DIAGNOSIS — M7051 Other bursitis of knee, right knee: Secondary | ICD-10-CM | POA: Diagnosis not present

## 2023-11-24 DIAGNOSIS — M1711 Unilateral primary osteoarthritis, right knee: Secondary | ICD-10-CM | POA: Diagnosis not present

## 2023-11-24 DIAGNOSIS — R2689 Other abnormalities of gait and mobility: Secondary | ICD-10-CM | POA: Diagnosis not present

## 2023-11-26 DIAGNOSIS — R2689 Other abnormalities of gait and mobility: Secondary | ICD-10-CM | POA: Diagnosis not present

## 2023-11-26 DIAGNOSIS — R2681 Unsteadiness on feet: Secondary | ICD-10-CM | POA: Diagnosis not present

## 2023-11-26 DIAGNOSIS — M6281 Muscle weakness (generalized): Secondary | ICD-10-CM | POA: Diagnosis not present

## 2023-11-26 DIAGNOSIS — M1711 Unilateral primary osteoarthritis, right knee: Secondary | ICD-10-CM | POA: Diagnosis not present

## 2023-11-26 DIAGNOSIS — M7051 Other bursitis of knee, right knee: Secondary | ICD-10-CM | POA: Diagnosis not present

## 2023-12-01 DIAGNOSIS — M7051 Other bursitis of knee, right knee: Secondary | ICD-10-CM | POA: Diagnosis not present

## 2023-12-01 DIAGNOSIS — R2681 Unsteadiness on feet: Secondary | ICD-10-CM | POA: Diagnosis not present

## 2023-12-01 DIAGNOSIS — R2689 Other abnormalities of gait and mobility: Secondary | ICD-10-CM | POA: Diagnosis not present

## 2023-12-01 DIAGNOSIS — M1711 Unilateral primary osteoarthritis, right knee: Secondary | ICD-10-CM | POA: Diagnosis not present

## 2023-12-01 DIAGNOSIS — M6281 Muscle weakness (generalized): Secondary | ICD-10-CM | POA: Diagnosis not present

## 2023-12-03 DIAGNOSIS — R350 Frequency of micturition: Secondary | ICD-10-CM | POA: Diagnosis not present

## 2023-12-05 DIAGNOSIS — M7051 Other bursitis of knee, right knee: Secondary | ICD-10-CM | POA: Diagnosis not present

## 2023-12-05 DIAGNOSIS — R2689 Other abnormalities of gait and mobility: Secondary | ICD-10-CM | POA: Diagnosis not present

## 2023-12-05 DIAGNOSIS — M6281 Muscle weakness (generalized): Secondary | ICD-10-CM | POA: Diagnosis not present

## 2023-12-05 DIAGNOSIS — M1711 Unilateral primary osteoarthritis, right knee: Secondary | ICD-10-CM | POA: Diagnosis not present

## 2023-12-05 DIAGNOSIS — R2681 Unsteadiness on feet: Secondary | ICD-10-CM | POA: Diagnosis not present

## 2023-12-08 DIAGNOSIS — M7051 Other bursitis of knee, right knee: Secondary | ICD-10-CM | POA: Diagnosis not present

## 2023-12-08 DIAGNOSIS — M6281 Muscle weakness (generalized): Secondary | ICD-10-CM | POA: Diagnosis not present

## 2023-12-08 DIAGNOSIS — R2689 Other abnormalities of gait and mobility: Secondary | ICD-10-CM | POA: Diagnosis not present

## 2023-12-08 DIAGNOSIS — R2681 Unsteadiness on feet: Secondary | ICD-10-CM | POA: Diagnosis not present

## 2023-12-08 DIAGNOSIS — M1711 Unilateral primary osteoarthritis, right knee: Secondary | ICD-10-CM | POA: Diagnosis not present

## 2023-12-12 DIAGNOSIS — R2681 Unsteadiness on feet: Secondary | ICD-10-CM | POA: Diagnosis not present

## 2023-12-12 DIAGNOSIS — M6281 Muscle weakness (generalized): Secondary | ICD-10-CM | POA: Diagnosis not present

## 2023-12-12 DIAGNOSIS — M1711 Unilateral primary osteoarthritis, right knee: Secondary | ICD-10-CM | POA: Diagnosis not present

## 2023-12-12 DIAGNOSIS — R2689 Other abnormalities of gait and mobility: Secondary | ICD-10-CM | POA: Diagnosis not present

## 2023-12-12 DIAGNOSIS — M7051 Other bursitis of knee, right knee: Secondary | ICD-10-CM | POA: Diagnosis not present

## 2023-12-15 DIAGNOSIS — M6281 Muscle weakness (generalized): Secondary | ICD-10-CM | POA: Diagnosis not present

## 2023-12-15 DIAGNOSIS — M7051 Other bursitis of knee, right knee: Secondary | ICD-10-CM | POA: Diagnosis not present

## 2023-12-15 DIAGNOSIS — R2689 Other abnormalities of gait and mobility: Secondary | ICD-10-CM | POA: Diagnosis not present

## 2023-12-15 DIAGNOSIS — M1711 Unilateral primary osteoarthritis, right knee: Secondary | ICD-10-CM | POA: Diagnosis not present

## 2023-12-15 DIAGNOSIS — R2681 Unsteadiness on feet: Secondary | ICD-10-CM | POA: Diagnosis not present

## 2023-12-17 DIAGNOSIS — M7051 Other bursitis of knee, right knee: Secondary | ICD-10-CM | POA: Diagnosis not present

## 2023-12-17 DIAGNOSIS — M6281 Muscle weakness (generalized): Secondary | ICD-10-CM | POA: Diagnosis not present

## 2023-12-17 DIAGNOSIS — R2689 Other abnormalities of gait and mobility: Secondary | ICD-10-CM | POA: Diagnosis not present

## 2023-12-17 DIAGNOSIS — M1711 Unilateral primary osteoarthritis, right knee: Secondary | ICD-10-CM | POA: Diagnosis not present

## 2023-12-17 DIAGNOSIS — R2681 Unsteadiness on feet: Secondary | ICD-10-CM | POA: Diagnosis not present

## 2023-12-18 DIAGNOSIS — S92344D Nondisplaced fracture of fourth metatarsal bone, right foot, subsequent encounter for fracture with routine healing: Secondary | ICD-10-CM | POA: Diagnosis not present

## 2023-12-24 DIAGNOSIS — M1711 Unilateral primary osteoarthritis, right knee: Secondary | ICD-10-CM | POA: Diagnosis not present

## 2023-12-24 DIAGNOSIS — R2689 Other abnormalities of gait and mobility: Secondary | ICD-10-CM | POA: Diagnosis not present

## 2023-12-24 DIAGNOSIS — M6281 Muscle weakness (generalized): Secondary | ICD-10-CM | POA: Diagnosis not present

## 2023-12-24 DIAGNOSIS — R2681 Unsteadiness on feet: Secondary | ICD-10-CM | POA: Diagnosis not present

## 2023-12-24 DIAGNOSIS — M7051 Other bursitis of knee, right knee: Secondary | ICD-10-CM | POA: Diagnosis not present

## 2023-12-25 DIAGNOSIS — R350 Frequency of micturition: Secondary | ICD-10-CM | POA: Diagnosis not present

## 2023-12-30 ENCOUNTER — Other Ambulatory Visit: Payer: Self-pay | Admitting: Internal Medicine

## 2023-12-30 DIAGNOSIS — Z1231 Encounter for screening mammogram for malignant neoplasm of breast: Secondary | ICD-10-CM

## 2024-01-01 DIAGNOSIS — R350 Frequency of micturition: Secondary | ICD-10-CM | POA: Diagnosis not present

## 2024-01-21 DIAGNOSIS — Z96651 Presence of right artificial knee joint: Secondary | ICD-10-CM | POA: Diagnosis not present

## 2024-01-21 DIAGNOSIS — M25561 Pain in right knee: Secondary | ICD-10-CM | POA: Diagnosis not present

## 2024-01-21 DIAGNOSIS — Z96653 Presence of artificial knee joint, bilateral: Secondary | ICD-10-CM | POA: Diagnosis not present

## 2024-01-30 ENCOUNTER — Other Ambulatory Visit: Payer: Self-pay | Admitting: Internal Medicine

## 2024-01-30 ENCOUNTER — Ambulatory Visit
Admission: RE | Admit: 2024-01-30 | Discharge: 2024-01-30 | Disposition: A | Discharge status: Home / Self Care | Source: Ambulatory Visit | Attending: Internal Medicine | Admitting: Internal Medicine

## 2024-01-30 DIAGNOSIS — N644 Mastodynia: Secondary | ICD-10-CM

## 2024-01-30 DIAGNOSIS — Z1231 Encounter for screening mammogram for malignant neoplasm of breast: Secondary | ICD-10-CM

## 2024-01-30 DIAGNOSIS — L299 Pruritus, unspecified: Secondary | ICD-10-CM

## 2024-02-09 DIAGNOSIS — Z961 Presence of intraocular lens: Secondary | ICD-10-CM | POA: Diagnosis not present

## 2024-02-09 DIAGNOSIS — H353131 Nonexudative age-related macular degeneration, bilateral, early dry stage: Secondary | ICD-10-CM | POA: Diagnosis not present

## 2024-02-12 ENCOUNTER — Ambulatory Visit (HOSPITAL_BASED_OUTPATIENT_CLINIC_OR_DEPARTMENT_OTHER)
Admission: RE | Admit: 2024-02-12 | Discharge: 2024-02-12 | Disposition: A | Discharge status: Home / Self Care | Source: Ambulatory Visit | Attending: Internal Medicine | Admitting: Internal Medicine

## 2024-02-12 ENCOUNTER — Other Ambulatory Visit (HOSPITAL_COMMUNITY): Payer: Self-pay | Admitting: Internal Medicine

## 2024-02-12 DIAGNOSIS — S0990XA Unspecified injury of head, initial encounter: Secondary | ICD-10-CM | POA: Diagnosis not present

## 2024-02-12 DIAGNOSIS — R42 Dizziness and giddiness: Secondary | ICD-10-CM | POA: Diagnosis not present

## 2024-02-12 DIAGNOSIS — R112 Nausea with vomiting, unspecified: Secondary | ICD-10-CM | POA: Diagnosis not present

## 2024-02-12 DIAGNOSIS — I672 Cerebral atherosclerosis: Secondary | ICD-10-CM | POA: Diagnosis not present

## 2024-02-12 DIAGNOSIS — W19XXXA Unspecified fall, initial encounter: Secondary | ICD-10-CM | POA: Diagnosis not present

## 2024-02-17 DIAGNOSIS — L820 Inflamed seborrheic keratosis: Secondary | ICD-10-CM | POA: Diagnosis not present

## 2024-02-17 DIAGNOSIS — L821 Other seborrheic keratosis: Secondary | ICD-10-CM | POA: Diagnosis not present

## 2024-02-17 DIAGNOSIS — L570 Actinic keratosis: Secondary | ICD-10-CM | POA: Diagnosis not present

## 2024-02-17 DIAGNOSIS — L218 Other seborrheic dermatitis: Secondary | ICD-10-CM | POA: Diagnosis not present

## 2024-02-17 DIAGNOSIS — L720 Epidermal cyst: Secondary | ICD-10-CM | POA: Diagnosis not present

## 2024-02-25 DIAGNOSIS — M26629 Arthralgia of temporomandibular joint, unspecified side: Secondary | ICD-10-CM | POA: Diagnosis not present

## 2024-02-25 DIAGNOSIS — G4733 Obstructive sleep apnea (adult) (pediatric): Secondary | ICD-10-CM | POA: Diagnosis not present

## 2024-02-26 ENCOUNTER — Ambulatory Visit
Admission: RE | Admit: 2024-02-26 | Discharge: 2024-02-26 | Disposition: A | Discharge status: Home / Self Care | Source: Ambulatory Visit | Attending: Internal Medicine | Admitting: Internal Medicine

## 2024-02-26 ENCOUNTER — Ambulatory Visit

## 2024-02-26 DIAGNOSIS — N644 Mastodynia: Secondary | ICD-10-CM | POA: Diagnosis not present

## 2024-02-26 DIAGNOSIS — N6459 Other signs and symptoms in breast: Secondary | ICD-10-CM | POA: Diagnosis not present

## 2024-02-26 DIAGNOSIS — L299 Pruritus, unspecified: Secondary | ICD-10-CM

## 2024-03-08 DIAGNOSIS — H8113 Benign paroxysmal vertigo, bilateral: Secondary | ICD-10-CM | POA: Diagnosis not present

## 2024-03-16 DIAGNOSIS — H8111 Benign paroxysmal vertigo, right ear: Secondary | ICD-10-CM | POA: Diagnosis not present

## 2024-03-16 DIAGNOSIS — R42 Dizziness and giddiness: Secondary | ICD-10-CM | POA: Diagnosis not present

## 2024-03-16 DIAGNOSIS — R2681 Unsteadiness on feet: Secondary | ICD-10-CM | POA: Diagnosis not present

## 2024-03-20 DIAGNOSIS — J45909 Unspecified asthma, uncomplicated: Secondary | ICD-10-CM | POA: Diagnosis not present

## 2024-03-20 DIAGNOSIS — I639 Cerebral infarction, unspecified: Secondary | ICD-10-CM | POA: Diagnosis not present

## 2024-03-20 DIAGNOSIS — E039 Hypothyroidism, unspecified: Secondary | ICD-10-CM | POA: Diagnosis not present

## 2024-03-20 DIAGNOSIS — I5032 Chronic diastolic (congestive) heart failure: Secondary | ICD-10-CM | POA: Diagnosis not present

## 2024-03-22 DIAGNOSIS — Z0000 Encounter for general adult medical examination without abnormal findings: Secondary | ICD-10-CM | POA: Diagnosis not present

## 2024-03-22 DIAGNOSIS — E785 Hyperlipidemia, unspecified: Secondary | ICD-10-CM | POA: Diagnosis not present

## 2024-03-22 DIAGNOSIS — M810 Age-related osteoporosis without current pathological fracture: Secondary | ICD-10-CM | POA: Diagnosis not present

## 2024-03-22 DIAGNOSIS — R7303 Prediabetes: Secondary | ICD-10-CM | POA: Diagnosis not present

## 2024-03-22 DIAGNOSIS — E039 Hypothyroidism, unspecified: Secondary | ICD-10-CM | POA: Diagnosis not present

## 2024-03-22 DIAGNOSIS — I2510 Atherosclerotic heart disease of native coronary artery without angina pectoris: Secondary | ICD-10-CM | POA: Diagnosis not present

## 2024-03-22 DIAGNOSIS — G629 Polyneuropathy, unspecified: Secondary | ICD-10-CM | POA: Diagnosis not present

## 2024-03-22 DIAGNOSIS — I10 Essential (primary) hypertension: Secondary | ICD-10-CM | POA: Diagnosis not present

## 2024-03-22 DIAGNOSIS — I5032 Chronic diastolic (congestive) heart failure: Secondary | ICD-10-CM | POA: Diagnosis not present

## 2024-03-22 DIAGNOSIS — G8191 Hemiplegia, unspecified affecting right dominant side: Secondary | ICD-10-CM | POA: Diagnosis not present

## 2024-03-22 DIAGNOSIS — E538 Deficiency of other specified B group vitamins: Secondary | ICD-10-CM | POA: Diagnosis not present

## 2024-03-22 DIAGNOSIS — J4540 Moderate persistent asthma, uncomplicated: Secondary | ICD-10-CM | POA: Diagnosis not present

## 2024-03-26 DIAGNOSIS — H8111 Benign paroxysmal vertigo, right ear: Secondary | ICD-10-CM | POA: Diagnosis not present

## 2024-03-26 DIAGNOSIS — R2681 Unsteadiness on feet: Secondary | ICD-10-CM | POA: Diagnosis not present

## 2024-03-26 DIAGNOSIS — R42 Dizziness and giddiness: Secondary | ICD-10-CM | POA: Diagnosis not present

## 2024-04-11 ENCOUNTER — Emergency Department (HOSPITAL_COMMUNITY)

## 2024-04-11 ENCOUNTER — Inpatient Hospital Stay (HOSPITAL_COMMUNITY)
Admission: EM | Admit: 2024-04-11 | Discharge: 2024-04-13 | DRG: 682 | Disposition: A | Discharge status: Skilled Nursing Facility | Attending: Family Medicine | Admitting: Family Medicine

## 2024-04-11 ENCOUNTER — Other Ambulatory Visit: Payer: Self-pay

## 2024-04-11 ENCOUNTER — Encounter (HOSPITAL_COMMUNITY): Payer: Self-pay

## 2024-04-11 DIAGNOSIS — I10 Essential (primary) hypertension: Secondary | ICD-10-CM | POA: Diagnosis present

## 2024-04-11 DIAGNOSIS — G629 Polyneuropathy, unspecified: Secondary | ICD-10-CM | POA: Diagnosis present

## 2024-04-11 DIAGNOSIS — Z888 Allergy status to other drugs, medicaments and biological substances status: Secondary | ICD-10-CM

## 2024-04-11 DIAGNOSIS — E039 Hypothyroidism, unspecified: Secondary | ICD-10-CM | POA: Diagnosis not present

## 2024-04-11 DIAGNOSIS — J4541 Moderate persistent asthma with (acute) exacerbation: Secondary | ICD-10-CM | POA: Diagnosis not present

## 2024-04-11 DIAGNOSIS — E860 Dehydration: Secondary | ICD-10-CM | POA: Diagnosis not present

## 2024-04-11 DIAGNOSIS — Z96653 Presence of artificial knee joint, bilateral: Secondary | ICD-10-CM | POA: Diagnosis not present

## 2024-04-11 DIAGNOSIS — N3289 Other specified disorders of bladder: Secondary | ICD-10-CM | POA: Diagnosis not present

## 2024-04-11 DIAGNOSIS — Z887 Allergy status to serum and vaccine status: Secondary | ICD-10-CM

## 2024-04-11 DIAGNOSIS — Z83438 Family history of other disorder of lipoprotein metabolism and other lipidemia: Secondary | ICD-10-CM

## 2024-04-11 DIAGNOSIS — M1611 Unilateral primary osteoarthritis, right hip: Secondary | ICD-10-CM | POA: Diagnosis not present

## 2024-04-11 DIAGNOSIS — R32 Unspecified urinary incontinence: Secondary | ICD-10-CM | POA: Diagnosis present

## 2024-04-11 DIAGNOSIS — Z9049 Acquired absence of other specified parts of digestive tract: Secondary | ICD-10-CM | POA: Diagnosis not present

## 2024-04-11 DIAGNOSIS — G4733 Obstructive sleep apnea (adult) (pediatric): Secondary | ICD-10-CM | POA: Diagnosis not present

## 2024-04-11 DIAGNOSIS — Z7951 Long term (current) use of inhaled steroids: Secondary | ICD-10-CM

## 2024-04-11 DIAGNOSIS — I69351 Hemiplegia and hemiparesis following cerebral infarction affecting right dominant side: Secondary | ICD-10-CM

## 2024-04-11 DIAGNOSIS — R9389 Abnormal findings on diagnostic imaging of other specified body structures: Secondary | ICD-10-CM | POA: Diagnosis not present

## 2024-04-11 DIAGNOSIS — K5730 Diverticulosis of large intestine without perforation or abscess without bleeding: Secondary | ICD-10-CM | POA: Diagnosis present

## 2024-04-11 DIAGNOSIS — Z823 Family history of stroke: Secondary | ICD-10-CM

## 2024-04-11 DIAGNOSIS — M47816 Spondylosis without myelopathy or radiculopathy, lumbar region: Secondary | ICD-10-CM | POA: Diagnosis not present

## 2024-04-11 DIAGNOSIS — E871 Hypo-osmolality and hyponatremia: Secondary | ICD-10-CM | POA: Diagnosis present

## 2024-04-11 DIAGNOSIS — R531 Weakness: Secondary | ICD-10-CM

## 2024-04-11 DIAGNOSIS — I2510 Atherosclerotic heart disease of native coronary artery without angina pectoris: Secondary | ICD-10-CM | POA: Diagnosis present

## 2024-04-11 DIAGNOSIS — J9811 Atelectasis: Secondary | ICD-10-CM | POA: Diagnosis not present

## 2024-04-11 DIAGNOSIS — Z7982 Long term (current) use of aspirin: Secondary | ICD-10-CM | POA: Diagnosis not present

## 2024-04-11 DIAGNOSIS — I700 Atherosclerosis of aorta: Secondary | ICD-10-CM | POA: Diagnosis not present

## 2024-04-11 DIAGNOSIS — N179 Acute kidney failure, unspecified: Principal | ICD-10-CM | POA: Diagnosis present

## 2024-04-11 DIAGNOSIS — M25552 Pain in left hip: Secondary | ICD-10-CM | POA: Diagnosis not present

## 2024-04-11 DIAGNOSIS — Z79899 Other long term (current) drug therapy: Secondary | ICD-10-CM

## 2024-04-11 DIAGNOSIS — F319 Bipolar disorder, unspecified: Secondary | ICD-10-CM | POA: Diagnosis present

## 2024-04-11 DIAGNOSIS — Z981 Arthrodesis status: Secondary | ICD-10-CM

## 2024-04-11 DIAGNOSIS — Z8673 Personal history of transient ischemic attack (TIA), and cerebral infarction without residual deficits: Secondary | ICD-10-CM

## 2024-04-11 DIAGNOSIS — E785 Hyperlipidemia, unspecified: Secondary | ICD-10-CM | POA: Diagnosis present

## 2024-04-11 DIAGNOSIS — M4186 Other forms of scoliosis, lumbar region: Secondary | ICD-10-CM | POA: Diagnosis not present

## 2024-04-11 DIAGNOSIS — Z86711 Personal history of pulmonary embolism: Secondary | ICD-10-CM

## 2024-04-11 DIAGNOSIS — J9601 Acute respiratory failure with hypoxia: Secondary | ICD-10-CM | POA: Diagnosis not present

## 2024-04-11 DIAGNOSIS — Z1152 Encounter for screening for COVID-19: Secondary | ICD-10-CM | POA: Diagnosis not present

## 2024-04-11 DIAGNOSIS — F331 Major depressive disorder, recurrent, moderate: Secondary | ICD-10-CM | POA: Diagnosis present

## 2024-04-11 DIAGNOSIS — F419 Anxiety disorder, unspecified: Secondary | ICD-10-CM | POA: Diagnosis present

## 2024-04-11 DIAGNOSIS — R0902 Hypoxemia: Secondary | ICD-10-CM | POA: Diagnosis not present

## 2024-04-11 DIAGNOSIS — Z79890 Hormone replacement therapy (postmenopausal): Secondary | ICD-10-CM

## 2024-04-11 DIAGNOSIS — Z882 Allergy status to sulfonamides status: Secondary | ICD-10-CM

## 2024-04-11 DIAGNOSIS — W19XXXA Unspecified fall, initial encounter: Secondary | ICD-10-CM | POA: Diagnosis not present

## 2024-04-11 DIAGNOSIS — Z043 Encounter for examination and observation following other accident: Secondary | ICD-10-CM | POA: Diagnosis not present

## 2024-04-11 LAB — CBC WITH DIFFERENTIAL/PLATELET
Abs Immature Granulocytes: 0.03 10*3/uL (ref 0.00–0.07)
Basophils Absolute: 0 10*3/uL (ref 0.0–0.1)
Basophils Relative: 0 %
Eosinophils Absolute: 0.2 10*3/uL (ref 0.0–0.5)
Eosinophils Relative: 2 %
HCT: 36 % (ref 36.0–46.0)
Hemoglobin: 11.1 g/dL — ABNORMAL LOW (ref 12.0–15.0)
Immature Granulocytes: 0 %
Lymphocytes Relative: 19 %
Lymphs Abs: 1.8 10*3/uL (ref 0.7–4.0)
MCH: 29.6 pg (ref 26.0–34.0)
MCHC: 30.8 g/dL (ref 30.0–36.0)
MCV: 96 fL (ref 80.0–100.0)
Monocytes Absolute: 1.2 10*3/uL — ABNORMAL HIGH (ref 0.1–1.0)
Monocytes Relative: 13 %
Neutro Abs: 5.9 10*3/uL (ref 1.7–7.7)
Neutrophils Relative %: 66 %
Platelets: 232 10*3/uL (ref 150–400)
RBC: 3.75 MIL/uL — ABNORMAL LOW (ref 3.87–5.11)
RDW: 12.9 % (ref 11.5–15.5)
WBC: 9.1 10*3/uL (ref 4.0–10.5)
nRBC: 0 % (ref 0.0–0.2)

## 2024-04-11 LAB — URINALYSIS, ROUTINE W REFLEX MICROSCOPIC
Bacteria, UA: NONE SEEN
Bilirubin Urine: NEGATIVE
Glucose, UA: NEGATIVE mg/dL
Hgb urine dipstick: NEGATIVE
Ketones, ur: NEGATIVE mg/dL
Nitrite: NEGATIVE
Protein, ur: NEGATIVE mg/dL
Specific Gravity, Urine: 1.01 (ref 1.005–1.030)
pH: 5 (ref 5.0–8.0)

## 2024-04-11 LAB — RESP PANEL BY RT-PCR (RSV, FLU A&B, COVID)  RVPGX2
Influenza A by PCR: NEGATIVE
Influenza B by PCR: NEGATIVE
Resp Syncytial Virus by PCR: NEGATIVE
SARS Coronavirus 2 by RT PCR: NEGATIVE

## 2024-04-11 LAB — BASIC METABOLIC PANEL WITH GFR
Anion gap: 8 (ref 5–15)
BUN: 20 mg/dL (ref 8–23)
CO2: 25 mmol/L (ref 22–32)
Calcium: 9.1 mg/dL (ref 8.9–10.3)
Chloride: 95 mmol/L — ABNORMAL LOW (ref 98–111)
Creatinine, Ser: 2.22 mg/dL — ABNORMAL HIGH (ref 0.44–1.00)
GFR, Estimated: 22 mL/min — ABNORMAL LOW (ref >60)
Glucose, Bld: 88 mg/dL (ref 70–99)
Potassium: 4.5 mmol/L (ref 3.5–5.1)
Sodium: 128 mmol/L — ABNORMAL LOW (ref 135–145)

## 2024-04-11 MED ORDER — SENNOSIDES-DOCUSATE SODIUM 8.6-50 MG PO TABS
1.0000 | ORAL_TABLET | Freq: Every evening | ORAL | Status: DC | PRN
  Administered 2024-04-12: 1 via ORAL
  Filled 2024-04-11: qty 1

## 2024-04-11 MED ORDER — LORAZEPAM 1 MG PO TABS
1.0000 mg | ORAL_TABLET | Freq: Every day | ORAL | Status: DC
  Administered 2024-04-11: 1 mg via ORAL
  Filled 2024-04-11: qty 1

## 2024-04-11 MED ORDER — PANTOPRAZOLE SODIUM 40 MG PO TBEC
40.0000 mg | DELAYED_RELEASE_TABLET | Freq: Every day | ORAL | Status: DC
  Administered 2024-04-11 – 2024-04-13 (×3): 40 mg via ORAL
  Filled 2024-04-11 (×3): qty 1

## 2024-04-11 MED ORDER — ATORVASTATIN CALCIUM 40 MG PO TABS
80.0000 mg | ORAL_TABLET | Freq: Every evening | ORAL | Status: DC
  Administered 2024-04-11 – 2024-04-13 (×3): 80 mg via ORAL
  Filled 2024-04-11 (×3): qty 2

## 2024-04-11 MED ORDER — MELATONIN 5 MG PO TABS
5.0000 mg | ORAL_TABLET | Freq: Every day | ORAL | Status: DC
  Administered 2024-04-11 – 2024-04-13 (×3): 5 mg via ORAL
  Filled 2024-04-11 (×3): qty 1

## 2024-04-11 MED ORDER — BUDESONIDE 0.25 MG/2ML IN SUSP
0.2500 mg | Freq: Two times a day (BID) | RESPIRATORY_TRACT | Status: DC
  Administered 2024-04-11 – 2024-04-13 (×5): 0.25 mg via RESPIRATORY_TRACT
  Filled 2024-04-11 (×4): qty 2

## 2024-04-11 MED ORDER — GABAPENTIN 300 MG PO CAPS
300.0000 mg | ORAL_CAPSULE | Freq: Every day | ORAL | Status: DC
  Administered 2024-04-11 – 2024-04-13 (×3): 300 mg via ORAL
  Filled 2024-04-11 (×3): qty 1

## 2024-04-11 MED ORDER — DOCUSATE SODIUM 100 MG PO CAPS
100.0000 mg | ORAL_CAPSULE | Freq: Every day | ORAL | Status: DC
  Administered 2024-04-12 – 2024-04-13 (×2): 100 mg via ORAL
  Filled 2024-04-11 (×2): qty 1

## 2024-04-11 MED ORDER — SODIUM CHLORIDE 0.9 % IV BOLUS
1000.0000 mL | Freq: Once | INTRAVENOUS | Status: AC
  Administered 2024-04-11: 1000 mL via INTRAVENOUS

## 2024-04-11 MED ORDER — ASPIRIN 325 MG PO TABS
325.0000 mg | ORAL_TABLET | Freq: Every day | ORAL | Status: DC
  Administered 2024-04-11 – 2024-04-13 (×3): 325 mg via ORAL
  Filled 2024-04-11 (×3): qty 1

## 2024-04-11 MED ORDER — BISACODYL 5 MG PO TBEC: 5.0000 mg | DELAYED_RELEASE_TABLET | Freq: Every day | ORAL | Status: DC | PRN

## 2024-04-11 MED ORDER — ACETAMINOPHEN 650 MG RE SUPP: 650.0000 mg | Freq: Four times a day (QID) | RECTAL | Status: DC | PRN

## 2024-04-11 MED ORDER — ARFORMOTEROL TARTRATE 15 MCG/2ML IN NEBU
15.0000 ug | INHALATION_SOLUTION | Freq: Two times a day (BID) | RESPIRATORY_TRACT | Status: DC
  Administered 2024-04-11 – 2024-04-13 (×5): 15 ug via RESPIRATORY_TRACT
  Filled 2024-04-11 (×5): qty 2

## 2024-04-11 MED ORDER — MONTELUKAST SODIUM 10 MG PO TABS
10.0000 mg | ORAL_TABLET | Freq: Every day | ORAL | Status: DC
  Administered 2024-04-11 – 2024-04-13 (×3): 10 mg via ORAL
  Filled 2024-04-11 (×3): qty 1

## 2024-04-11 MED ORDER — HEPARIN SODIUM (PORCINE) 5000 UNIT/ML IJ SOLN
5000.0000 [IU] | Freq: Three times a day (TID) | INTRAMUSCULAR | Status: DC
  Administered 2024-04-11 – 2024-04-13 (×6): 5000 [IU] via SUBCUTANEOUS
  Filled 2024-04-11 (×7): qty 1

## 2024-04-11 MED ORDER — IPRATROPIUM-ALBUTEROL 0.5-2.5 (3) MG/3ML IN SOLN
3.0000 mL | Freq: Once | RESPIRATORY_TRACT | Status: AC
  Administered 2024-04-11: 3 mL via RESPIRATORY_TRACT
  Filled 2024-04-11: qty 3

## 2024-04-11 MED ORDER — SODIUM CHLORIDE 0.9 % IV SOLN: INTRAVENOUS | Status: AC

## 2024-04-11 MED ORDER — LEVOTHYROXINE SODIUM 50 MCG PO TABS
50.0000 ug | ORAL_TABLET | Freq: Every day | ORAL | Status: DC
  Administered 2024-04-12 – 2024-04-13 (×2): 50 ug via ORAL
  Filled 2024-04-11 (×2): qty 1

## 2024-04-11 MED ORDER — BUPROPION HCL ER (SR) 150 MG PO TB12
150.0000 mg | ORAL_TABLET | Freq: Every day | ORAL | Status: DC
  Administered 2024-04-12 – 2024-04-13 (×2): 150 mg via ORAL
  Filled 2024-04-11 (×2): qty 1

## 2024-04-11 MED ORDER — ONDANSETRON HCL 4 MG/2ML IJ SOLN
4.0000 mg | Freq: Four times a day (QID) | INTRAMUSCULAR | Status: DC | PRN
Start: 2024-04-11 — End: 2024-04-14
  Administered 2024-04-12: 4 mg via INTRAVENOUS
  Filled 2024-04-11: qty 2

## 2024-04-11 MED ORDER — ONDANSETRON HCL 4 MG PO TABS: 4.0000 mg | ORAL_TABLET | Freq: Four times a day (QID) | ORAL | Status: DC | PRN

## 2024-04-11 MED ORDER — GABAPENTIN 100 MG PO CAPS
100.0000 mg | ORAL_CAPSULE | Freq: Two times a day (BID) | ORAL | Status: DC
  Administered 2024-04-12 – 2024-04-13 (×4): 100 mg via ORAL
  Filled 2024-04-11 (×4): qty 1

## 2024-04-11 MED ORDER — ALBUTEROL SULFATE (2.5 MG/3ML) 0.083% IN NEBU: 2.5000 mg | INHALATION_SOLUTION | RESPIRATORY_TRACT | Status: DC | PRN

## 2024-04-11 MED ORDER — VERAPAMIL HCL ER 180 MG PO TBCR
360.0000 mg | EXTENDED_RELEASE_TABLET | Freq: Every day | ORAL | Status: DC
  Administered 2024-04-12 – 2024-04-13 (×2): 360 mg via ORAL
  Filled 2024-04-11 (×2): qty 2

## 2024-04-11 MED ORDER — ACETAMINOPHEN 325 MG PO TABS
650.0000 mg | ORAL_TABLET | Freq: Four times a day (QID) | ORAL | Status: DC | PRN
  Administered 2024-04-11 – 2024-04-12 (×2): 650 mg via ORAL
  Filled 2024-04-11 (×2): qty 2

## 2024-04-11 NOTE — H&P (Signed)
 History and Physical    Courtney [name] FMW:[id number] DOB: February 02, 1946 DOA: 04/11/2024  PCP: Ransom Other, MD  Patient coming from: Home  I have personally briefly reviewed patient's old medical records in [facility]  Chief Complaint: Generalized weakness, fall at home  HPI: Courtney [name] is a 78 y.o. female with medical history significant for moderate persistent asthma, history of CVA with mild residual right-sided weakness, HTN, HLD, hypothyroidism, depression/anxiety, OSA on CPAP, history of C. difficile colitis, urinary incontinence s/p bladder stim implant (04/09/2024) who presented to the ED for evaluation of generalized weakness and fall at home.  Patient states that she underwent bladder stimulator implant this past Friday 6/20 with her urologist Dr. Gaston.  She says she has had good urine output without any further incontinence episodes since then.  She however has been feeling generally weak and fatigued.  She reports chronic right-sided weakness from prior stroke and usually ambulates with use of a walker.  This morning around 3 AM she had an episode of nausea and vomiting.  She reports new nonproductive cough and sore throat.  She says she has not really eating well the last 2 days.  Earlier today she was requiring help from her husband to transfer from bed to transport chair when she felt like her legs gave out from underneath her.  She fell to the ground landing on her bottom.  She did not hit her head or lose consciousness.  She did not have any associated lightheadedness, dizziness, chest pain, palpitations, dyspnea.  She says she has not been taking any NSAIDs for her pain.  She usually uses Tylenol  and took 1 tablet of oxycodone  prescribed after her procedure.  ED Course  Labs/Imaging on admission: I have personally reviewed following labs and imaging studies.  Initial vitals showed BP 122/72, pulse 66, RR 16, temp 98.1 F, SpO2 85% on room air.  Patient placed  on 2 to O2 via [place] with SpO2 improved to 99%.  Labs showed BUN 20, creatinine 2.22, sodium 128, potassium 4.5, bicarb 25, serum glucose 88, WBC 9.1, hemoglobin 11.1, platelets 232.  Urinalysis negative ketones, negative protein, negative nitrates, trace leukocytes, 0-5 RBCs and WBCs, no bacteria.  Right hip x-ray showed moderate degenerative changes of right hip without acute fracture or dislocation.  Lumbar spine x-ray negative for obvious acute fracture.  Stable surgical hardware multilevel degenerative changes noted.  Portable chest x-ray negative for focal consolidation, edema, effusion.  CT renal stone study negative for acute abnormality.  Diverticulosis without diverticulitis noted.  Mild allergic changes noted in the lung bases bilaterally.  InterStim device noted on the right with placement within normal limits.  Patient was given 1 L normal saline and DuoNeb treatment.  The hospitalist service was consulted to admit.  Review of Systems: All systems reviewed and are negative except as documented in history of present illness above.   Past Medical History:  Diagnosis Date   Anemia    Aortic atherosclerosis (HCC)    Arthritis    Bilateral Wrists   Asthma    Bipolar disorder (HCC)    Coronary artery calcification    Depression    Diverticulitis    Headache    Hiatal hernia    History of kidney stones 2006   Hypertension    Hypothyroidism (acquired) 04/02/2021   Kidney stone    Mitral valve prolapse    OSA (obstructive sleep apnea) 04/02/2021   wears CPAP   Panic attack    PONV (  postoperative nausea and vomiting)    Pulmonary embolus (HCC)    Stroke [facility])    Thrombophlebitis    Vertigo     Past Surgical History:  Procedure Laterality Date   ABDOMINAL HYSTERECTOMY     ACHILLES TENDON SURGERY Right    ANTERIOR CERVICAL DECOMP/DISCECTOMY FUSION N/A 03/28/2021   Procedure: Cervical three-four Cervical four-five Cervical five-six Anterior cervical  decompression/discectomy/fusion/interbody prosthesis/plate/screws;  Surgeon: Mavis Purchase, MD;  Location: [facility] OR;  Service: Neurosurgery;  Laterality: N/A;   BACK SURGERY     BREAST REDUCTION SURGERY     BUNIONECTOMY Right    CARPAL TUNNEL RELEASE Right 06/04/2023   Procedure: CARPAL TUNNEL RELEASE;  Surgeon: Mavis Purchase, MD;  Location: [facility] OR;  Service: Neurosurgery;  Laterality: Right;   CHOLECYSTECTOMY     EYE SURGERY Bilateral 2000   Cataract   JOINT REPLACEMENT Bilateral    Knee replacement   TOE SURGERY Right    hammer toe surgery   TONSILLECTOMY     ULNAR NERVE TRANSPOSITION Right 06/04/2023   Procedure: ULNAR NERVE DECOMPRESSION/TRANSPOSITION;  Surgeon: Mavis Purchase, MD;  Location: [facility] OR;  Service: Neurosurgery;  Laterality: Right;    Social History: Social History   Tobacco Use   Smoking status: Never   Smokeless tobacco: Never  Vaping Use   Vaping status: Never Used  Substance Use Topics   Alcohol use: Yes    Comment: Very rare- maybe once a year   Drug use: Not Currently   Allergies  Allergen Reactions   Tetanus Toxoids Anaphylaxis   Tetanus-Diphtheria Toxoids Td Anaphylaxis   Meloxicam Nausea And Vomiting   Phentermine Nausea And Vomiting   Sulfa Antibiotics Diarrhea and Nausea And Vomiting   Zithromax [Azithromycin] Diarrhea and Nausea And Vomiting    Family History  Problem Relation Age of Onset   Stroke Father    Hyperlipidemia Father    Esophageal cancer Maternal Uncle    Breast cancer Paternal Aunt        70s   CAD Neg Hx      Prior to Admission medications   Medication Sig Start Date End Date Taking? Authorizing Provider  albuterol  (VENTOLIN  HFA) 108 (90 Base) MCG/ACT inhaler Inhale 1 puff into the lungs every 6 (six) hours as needed for wheezing or shortness of breath. 02/22/21   [provider]  aspirin  325 MG tablet Take 325 mg by mouth daily.    [provider]  atorvastatin  (LIPITOR ) 80 MG tablet Take 80 mg by  mouth every evening. 02/21/21   [provider]  azelastine (ASTELIN) 0.1 % nasal spray Place 1 spray into both nostrils 2 (two) times daily.    [provider]  Biotin 5000 MCG CAPS Take 5,000 mcg by mouth daily.    [provider]  buPROPion  (WELLBUTRIN  SR) 150 MG 12 hr tablet Take 150 mg by mouth daily.    [provider]  Calcium  Carb-Cholecalciferol  (CALCIUM  600/VITAMIN D  PO) Take 1 tablet by mouth daily.    [provider]  Cholecalciferol  (VITAMIN D3) 25 MCG (1000 UT) CAPS Take 1,000 Units by mouth daily.    [provider]  cyanocobalamin  (,VITAMIN B-12,) 1000 MCG/ML injection Inject 1,000 mcg into the muscle every 30 (thirty) days. 02/23/21   [provider]  diclofenac Sodium (VOLTAREN) 1 % GEL Apply 2 g topically 4 (four) times daily as needed (pain). 12/02/20   [provider]  docusate sodium  (COLACE) 100 MG capsule Take 100 mg by mouth daily.  [provider]  escitalopram  (LEXAPRO ) 10 MG tablet Take 10 mg by mouth daily. 11/24/20   [provider]  gabapentin  (NEURONTIN ) 100 MG capsule Take 100 mg by mouth in the morning, at noon, and at bedtime. 02/13/21   [provider]  HYDROcodone -acetaminophen  (NORCO/VICODIN) 5-325 MG tablet Take 1 tablet by mouth every 4 (four) hours as needed for moderate pain. 06/04/23 06/03/24  Bergman, Meghan D, NP  ipratropium (ATROVENT) 0.03 % nasal spray Place 1 spray into both nostrils daily. 04/01/23 03/31/24  [provider]  levothyroxine  (SYNTHROID ) 50 MCG tablet Take 50 mcg by mouth at bedtime. 02/09/21   [provider]  LORazepam  (ATIVAN ) 1 MG tablet Take 1 tablet (1 mg total) by mouth at bedtime. 01/03/22   Bryn Bernardino NOVAK, MD  magnesium  oxide (MAG-OX) 400 (240 Mg) MG tablet Take 400 mg by mouth daily.    [provider]  meclizine  (ANTIVERT ) 25 MG tablet Take 25 mg by mouth every 8 (eight) hours as needed for dizziness.    [provider]  melatonin 5 MG TABS Take 5 mg by mouth at bedtime.    [provider]  montelukast  (SINGULAIR ) 10 MG tablet Take 10 mg by mouth at bedtime. 02/13/21   [provider]  multivitamin-lutein (OCUVITE-LUTEIN) CAPS capsule Take 1 capsule by mouth daily.    [provider]  olmesartan (BENICAR) 40 MG tablet Take 40 mg by mouth daily. 02/21/21   [provider]  oxybutynin (DITROPAN-XL) 10 MG 24 hr tablet Take 10 mg by mouth daily.    [provider]  pantoprazole  (PROTONIX ) 40 MG tablet Take 40 mg by mouth daily.    [provider]  PRESCRIPTION MEDICATION CPAP- At bedtime    [provider]  promethazine  (PHENERGAN ) 25 MG tablet Take 25 mg by mouth every 6 (six) hours as needed for vomiting or nausea. 12/22/17   [provider]  TRELEGY ELLIPTA 100-62.5-25 MCG/ACT AEPB Inhale 1 puff into the lungs daily.    [provider]  verapamil  (CALAN -SR) 120 MG CR tablet Take 360 mg by mouth daily. 02/13/21   [provider]    Physical Exam: Vitals:   04/11/24 1600 04/11/24 1710 04/11/24 1730 04/11/24 1822  BP: (!) 144/82 137/61 (!) 140/68   Pulse: (!) 59 68 67   Resp: 16  16   Temp:  97.9 F (36.6 C)    TempSrc:      SpO2: 96% 100% 99% 99%   Constitutional: Resting in bed with head elevated.  NAD, calm, comfortable Eyes: EOMI, lids and conjunctivae normal ENMT: Mucous membranes are moist. Posterior pharynx clear of any exudate or lesions.Normal dentition.  Neck: normal, supple, no masses. Respiratory: clear to auscultation bilaterally, no wheezing, no crackles. Normal respiratory effort while on 2 L O2 via [place]. No accessory muscle use.  Cardiovascular: Regular rate and rhythm, no murmurs / rubs / gallops. No extremity edema. 2+ pedal pulses. Abdomen: no tenderness, no masses palpated. Musculoskeletal: no clubbing / cyanosis. No joint deformity upper and lower extremities. Good ROM, no contractures.  Normal muscle tone.  Skin: no rashes, lesions, ulcers. No induration Neurologic: Sensation intact. Strength 5/5 in all 4.  Psychiatric: Normal judgment and insight. Alert and oriented x 3. Normal mood.   EKG: Personally reviewed. Sinus rhythm, rate 69, PVCs present.  Assessment/Plan Principal Problem:   Acute kidney injury [facility]) Active Problems:   Moderate persistent asthma with acute exacerbation   Hyponatremia   Hypertension  Hypothyroidism (acquired)   Moderate recurrent major depression and anxiety    History of CVA (cerebrovascular accident)   Dyslipidemia   OSA (obstructive sleep apnea)   Courtney [name] is a 78 y.o. female with medical history significant for moderate persistent asthma, history of CVA with mild residual right-sided weakness, HTN, HLD, hypothyroidism, depression/anxiety, OSA on CPAP, history of C. difficile colitis, urinary incontinence s/p bladder stim implant (04/09/2024) who is admitted with AKI.  Assessment and Plan: Acute kidney injury: Creatinine 2.22 on admission compared to previous 0.94 on 10/07/2023.  BUN 20, UA normal.  Suspect multifactorial related to dehydration from poor oral intake, emesis, as well as medication effect. - Continue IV fluid hydration overnight - Check urine sodium and creatinine - Hold ARB, avoid NSAIDs - Monitor UOP, bladder scan prn, repeat labs in a.m.  Moderate persistent asthma: Patient noted to have SpO2 85% on RA on arrival to ED, currently stable on 2 L O2 via [place].  Per EDP she had some wheezing and was given DuoNeb treatment.  Lung sounds improved posttreatment.  Mild bibasilar atelectasis noted on CT. - Start Brovana/Pulmicort  bid tonight - Albuterol  nebs as needed - Wean supplemental O2 as able - Continue Singulair  - Incentive spirometer - COVID/influenza/RSV PCR negative  Hyponatremia: Mild with sodium 128 on admission.  Suspect secondary to mild hypovolemia.  Continue IV fluids and repeat labs in  AM.  Generalized weakness with fall at home: Fall at home without injury.  Patient with mild chronic right-sided weakness from prior stroke but otherwise no new focal deficits. - PT/OT eval  Hypertension: Holding olmesartan.  Continue verapamil .  Urinary incontinence s/p bladder stim implant 04/09/2024: Patient with recent bladder similar device implant on 04/09/2024 by urology Dr. Glendia Elizabeth.  CT shows InterStim device on the right with placement within normal limits.  Patient reports good urine output without further episodes of incontinence.  History of CVA with mild residual right-sided weakness: Continue aspirin  and statin.  Hypothyroidism: Continue Synthroid .  Hyperlipidemia: Continue atorvastatin .  Depression/anxiety: Continue Wellbutrin .  Hold Lexapro  for now.  Neuropathy: Continue gabapentin .  OSA: Continue CPAP nightly.   DVT prophylaxis: heparin injection 5,000 Units Start: 04/11/24 2200 Code Status: Full code, confirmed with patient on admission Family Communication: Husband at bedside Disposition Plan: From home, dispo pending clinical progress Consults called: None Severity of Illness: The appropriate patient status for this patient is OBSERVATION. Observation status is judged to be reasonable and necessary in order to provide the required intensity of service to ensure the patient's safety. The patient's presenting symptoms, physical exam findings, and initial radiographic and laboratory data in the context of their medical condition is felt to place them at decreased risk for further clinical deterioration. Furthermore, it is anticipated that the patient will be medically stable for discharge from the hospital within 2 midnights of admission.   Jorie Blanch MD [facility]  If 7PM-7AM, please contact night-coverage [email or web address]  04/11/2024, 7:39 PM

## 2024-04-11 NOTE — Hospital Course (Signed)
 Courtney [name] is a 78 y.o. female with medical history significant for moderate persistent asthma, history of CVA with mild residual right-sided weakness, HTN, HLD, hypothyroidism, depression/anxiety, OSA on CPAP, history of C. difficile colitis, urinary incontinence s/p bladder stim implant (04/09/2024) who is admitted with AKI.

## 2024-04-11 NOTE — ED Provider Notes (Signed)
 [place] EMERGENCY DEPARTMENT AT [facility] Provider Note   CSN: [id number] Arrival date & time: 04/11/24  1257     Patient presents with: Fall and Weakness   Courtney [name] is a 78 y.o. female.   HPI   78 year old female presents emergency department with fall from standing down onto buttocks.  She is status post bladder stim implantation on Friday, 1 day ago.  The procedure itself was uncomplicated.  However the patient has been experiencing weakness since the procedure.  She states that she feels generally weak and like she has no strength in her legs.  She was trying to transfer from her bed to transport chair when she went down onto her bottom secondary to not being able to sustain her weight in her legs.  She did not hit her head, there was no loss of consciousness.  Her main discomfort is in her right buttock/hip.  The incision sites are still well dressed, no bleeding or oozing.  She has had improvement of bladder control since the stimulator implant.  Prior to Admission medications   Medication Sig Start Date End Date Taking? Authorizing Provider  albuterol  (VENTOLIN  HFA) 108 (90 Base) MCG/ACT inhaler Inhale 1 puff into the lungs every 6 (six) hours as needed for wheezing or shortness of breath. 02/22/21   [provider]  aspirin  325 MG tablet Take 325 mg by mouth daily.    [provider]  atorvastatin  (LIPITOR ) 80 MG tablet Take 80 mg by mouth every evening. 02/21/21   [provider]  azelastine (ASTELIN) 0.1 % nasal spray Place 1 spray into both nostrils 2 (two) times daily.    [provider]  Biotin 5000 MCG CAPS Take 5,000 mcg by mouth daily.    [provider]  buPROPion  (WELLBUTRIN  SR) 150 MG 12 hr tablet Take 150 mg by mouth daily.    [provider]  Calcium  Carb-Cholecalciferol  (CALCIUM  600/VITAMIN D  PO) Take 1 tablet by mouth daily.    [provider]  Cholecalciferol  (VITAMIN D3) 25 MCG (1000  UT) CAPS Take 1,000 Units by mouth daily.    [provider]  cyanocobalamin  (,VITAMIN B-12,) 1000 MCG/ML injection Inject 1,000 mcg into the muscle every 30 (thirty) days. 02/23/21   [provider]  diclofenac Sodium (VOLTAREN) 1 % GEL Apply 2 g topically 4 (four) times daily as needed (pain). 12/02/20   [provider]  docusate sodium  (COLACE) 100 MG capsule Take 100 mg by mouth daily.    [provider]  escitalopram  (LEXAPRO ) 10 MG tablet Take 10 mg by mouth daily. 11/24/20   [provider]  gabapentin  (NEURONTIN ) 100 MG capsule Take 100 mg by mouth in the morning, at noon, and at bedtime. 02/13/21   [provider]  HYDROcodone -acetaminophen  (NORCO/VICODIN) 5-325 MG tablet Take 1 tablet by mouth every 4 (four) hours as needed for moderate pain. 06/04/23 06/03/24  Bergman, Meghan D, NP  ipratropium (ATROVENT) 0.03 % nasal spray Place 1 spray into both nostrils daily. 04/01/23 03/31/24  [provider]  levothyroxine  (SYNTHROID ) 50 MCG tablet Take 50 mcg by mouth at bedtime. 02/09/21   [provider]  LORazepam  (ATIVAN ) 1 MG tablet Take 1 tablet (1 mg total) by mouth at bedtime. 01/03/22   Bryn Bernardino NOVAK, MD  magnesium  oxide (MAG-OX) 400 (240 Mg) MG tablet Take 400 mg by mouth daily.    [provider]  meclizine  (ANTIVERT ) 25 MG tablet Take 25 mg by mouth every  8 (eight) hours as needed for dizziness.    [provider]  melatonin 5 MG TABS Take 5 mg by mouth at bedtime.    [provider]  montelukast  (SINGULAIR ) 10 MG tablet Take 10 mg by mouth at bedtime. 02/13/21   [provider]  multivitamin-lutein (OCUVITE-LUTEIN) CAPS capsule Take 1 capsule by mouth daily.    [provider]  olmesartan (BENICAR) 40 MG tablet Take 40 mg by mouth daily. 02/21/21   [provider]  oxybutynin (DITROPAN-XL) 10 MG 24 hr tablet Take 10 mg by mouth daily.    [provider]  pantoprazole   (PROTONIX ) 40 MG tablet Take 40 mg by mouth daily.    [provider]  PRESCRIPTION MEDICATION CPAP- At bedtime    [provider]  promethazine  (PHENERGAN ) 25 MG tablet Take 25 mg by mouth every 6 (six) hours as needed for vomiting or nausea. 12/22/17   [provider]  TRELEGY ELLIPTA 100-62.5-25 MCG/ACT AEPB Inhale 1 puff into the lungs daily.    [provider]  verapamil  (CALAN -SR) 120 MG CR tablet Take 360 mg by mouth daily. 02/13/21   [provider]    Allergies: Tetanus-diphtheria toxoids td, Meloxicam, Phentermine, Sulfa antibiotics, and Zithromax [azithromycin]    Review of Systems  Constitutional:  Negative for fever.  Respiratory:  Negative for shortness of breath.   Cardiovascular:  Negative for chest pain.  Gastrointestinal:  Negative for abdominal pain, diarrhea and vomiting.  Musculoskeletal:  Negative for back pain.       + right buttocks and hip pain  Skin:  Negative for rash.  Neurological:  Negative for headaches.    Updated Vital Signs BP (!) 125/54   Pulse (!) 59   Temp 98.1 F (36.7 C) (Oral)   Resp 19   SpO2 96%   Physical Exam Vitals and nursing note reviewed.  Constitutional:      General: She is not in acute distress.    Appearance: Normal appearance.  HENT:     Head: Normocephalic and atraumatic.     Mouth/Throat:     Mouth: Mucous membranes are moist.   Cardiovascular:     Rate and Rhythm: Bradycardia present.  Pulmonary:     Effort: Pulmonary effort is normal. No respiratory distress.     Breath sounds: No rales.  Abdominal:     Palpations: Abdomen is soft.     Tenderness: There is no abdominal tenderness.   Musculoskeletal:        General: No swelling or deformity.     Comments: Both incision sites from bladder stimulator implantation are dressed, clean, no bleeding or oozing, some ecchymosis around the site which is from the procedure.  Tenderness to palpation in the lower right buttock/right  hip and groin.  No midline spinal tenderness in the lumbar spine.   Skin:    General: Skin is warm.   Neurological:     Mental Status: She is alert and oriented to person, place, and time. Mental status is at baseline.   Psychiatric:        Mood and Affect: Mood normal.     (all labs ordered are listed, but only abnormal results are displayed) Labs Reviewed  CBC WITH DIFFERENTIAL/PLATELET  BASIC METABOLIC PANEL WITH GFR  URINALYSIS, ROUTINE W REFLEX MICROSCOPIC    EKG: EKG Interpretation Date/Time:  Sunday April 11 2024 13:18:30 EDT Ventricular Rate:  57 PR Interval:  164 QRS Duration:  94 QT Interval:  417 QTC  Calculation: 406 R Axis:   51  Text Interpretation: Sinus rhythm Abnormal R-wave progression, early transition Confirmed by Bari Flank [phone]) on 04/11/2024 2:55:12 PM  Radiology: No results found.   Procedures   Medications Ordered in the ED - No data to display                                  Medical Decision Making Amount and/or Complexity of Data Reviewed Labs: ordered. Radiology: ordered.  Risk Prescription drug management. Decision regarding hospitalization.   78 year old female presents emergency department with mechanical fall attributed to worsening weakness status post bladder stimulation implantation 1 day ago.  She states that her urinary symptoms have significantly improved since the placement.  Vitals are stable on arrival.  She is fatigued appearing.  History of asthma and is hypoxic into the high 80s on room air.  Does use CPAP secondary to OSA but otherwise no supplemental oxygen.  Placed on 2 L and feels improved.  Faint wheezing on exam, DuoNeb ordered.  X-ray imaging shows the stimulator in place with appropriate lead wires.  Blood work reveals a new acute creatinine of 2.22 up from a baseline of 0.9.  Also mild hyponatremia of 128.  Patient admits to decreased p.o. intake but has been eating and drinking.  Bladder scan showed  about 400 cc of urine.  Patient was able to void freely, urinalysis showed no findings of infection.  CT renal stone ordered and pending.  Patient will be admitted for uptrending creatinine, generalized weakness, mild hyponatremia.  Patients evaluation and results requires admission for further treatment and care.  Spoke with hospitalist, reviewed patient's ED course and they accept admission.  Patient agrees with admission plan, offers no new complaints and is stable/unchanged at time of admit.       Final diagnoses:  None    ED Discharge Orders     None          Bari Flank HERO, DO 04/11/24 1924

## 2024-04-11 NOTE — Progress Notes (Signed)
   04/11/24 2101  BiPAP/CPAP/SIPAP  BiPAP/CPAP/SIPAP Pt Type Adult  Reason BIPAP/CPAP not in use Non-compliant (pt refusing cpap for the night)

## 2024-04-11 NOTE — ED Triage Notes (Signed)
 EMS reports from home, Pt had bladder stimulator procedure Friday which made Pt weak. While transferring from bed to chair fell to chair and then floor on her buttocks. Pt states only pain is the incision site on right hip. Incision intact. No LOC, no blood thinners, no obvious injuries or other c/o.

## 2024-04-12 DIAGNOSIS — R0902 Hypoxemia: Secondary | ICD-10-CM | POA: Diagnosis not present

## 2024-04-12 DIAGNOSIS — I700 Atherosclerosis of aorta: Secondary | ICD-10-CM | POA: Diagnosis present

## 2024-04-12 DIAGNOSIS — J4541 Moderate persistent asthma with (acute) exacerbation: Secondary | ICD-10-CM | POA: Diagnosis not present

## 2024-04-12 DIAGNOSIS — R2689 Other abnormalities of gait and mobility: Secondary | ICD-10-CM | POA: Diagnosis not present

## 2024-04-12 DIAGNOSIS — Z86711 Personal history of pulmonary embolism: Secondary | ICD-10-CM | POA: Diagnosis not present

## 2024-04-12 DIAGNOSIS — M6281 Muscle weakness (generalized): Secondary | ICD-10-CM | POA: Diagnosis not present

## 2024-04-12 DIAGNOSIS — E039 Hypothyroidism, unspecified: Secondary | ICD-10-CM | POA: Diagnosis not present

## 2024-04-12 DIAGNOSIS — I639 Cerebral infarction, unspecified: Secondary | ICD-10-CM | POA: Diagnosis not present

## 2024-04-12 DIAGNOSIS — G4733 Obstructive sleep apnea (adult) (pediatric): Secondary | ICD-10-CM | POA: Diagnosis not present

## 2024-04-12 DIAGNOSIS — R278 Other lack of coordination: Secondary | ICD-10-CM | POA: Diagnosis not present

## 2024-04-12 DIAGNOSIS — R531 Weakness: Secondary | ICD-10-CM | POA: Diagnosis not present

## 2024-04-12 DIAGNOSIS — Z83438 Family history of other disorder of lipoprotein metabolism and other lipidemia: Secondary | ICD-10-CM | POA: Diagnosis not present

## 2024-04-12 DIAGNOSIS — R0989 Other specified symptoms and signs involving the circulatory and respiratory systems: Secondary | ICD-10-CM | POA: Diagnosis not present

## 2024-04-12 DIAGNOSIS — Z96653 Presence of artificial knee joint, bilateral: Secondary | ICD-10-CM | POA: Diagnosis present

## 2024-04-12 DIAGNOSIS — Z1152 Encounter for screening for COVID-19: Secondary | ICD-10-CM | POA: Diagnosis not present

## 2024-04-12 DIAGNOSIS — Z7982 Long term (current) use of aspirin: Secondary | ICD-10-CM | POA: Diagnosis not present

## 2024-04-12 DIAGNOSIS — F319 Bipolar disorder, unspecified: Secondary | ICD-10-CM | POA: Diagnosis present

## 2024-04-12 DIAGNOSIS — E871 Hypo-osmolality and hyponatremia: Secondary | ICD-10-CM | POA: Diagnosis present

## 2024-04-12 DIAGNOSIS — Z7401 Bed confinement status: Secondary | ICD-10-CM | POA: Diagnosis not present

## 2024-04-12 DIAGNOSIS — E785 Hyperlipidemia, unspecified: Secondary | ICD-10-CM | POA: Diagnosis not present

## 2024-04-12 DIAGNOSIS — F419 Anxiety disorder, unspecified: Secondary | ICD-10-CM | POA: Diagnosis present

## 2024-04-12 DIAGNOSIS — K5730 Diverticulosis of large intestine without perforation or abscess without bleeding: Secondary | ICD-10-CM | POA: Diagnosis present

## 2024-04-12 DIAGNOSIS — I69351 Hemiplegia and hemiparesis following cerebral infarction affecting right dominant side: Secondary | ICD-10-CM | POA: Diagnosis not present

## 2024-04-12 DIAGNOSIS — G5600 Carpal tunnel syndrome, unspecified upper limb: Secondary | ICD-10-CM | POA: Diagnosis not present

## 2024-04-12 DIAGNOSIS — K219 Gastro-esophageal reflux disease without esophagitis: Secondary | ICD-10-CM | POA: Diagnosis not present

## 2024-04-12 DIAGNOSIS — Z743 Need for continuous supervision: Secondary | ICD-10-CM | POA: Diagnosis not present

## 2024-04-12 DIAGNOSIS — E860 Dehydration: Secondary | ICD-10-CM | POA: Diagnosis present

## 2024-04-12 DIAGNOSIS — J9601 Acute respiratory failure with hypoxia: Secondary | ICD-10-CM | POA: Diagnosis present

## 2024-04-12 DIAGNOSIS — Z823 Family history of stroke: Secondary | ICD-10-CM | POA: Diagnosis not present

## 2024-04-12 DIAGNOSIS — N179 Acute kidney failure, unspecified: Secondary | ICD-10-CM | POA: Diagnosis not present

## 2024-04-12 DIAGNOSIS — Z79890 Hormone replacement therapy (postmenopausal): Secondary | ICD-10-CM | POA: Diagnosis not present

## 2024-04-12 DIAGNOSIS — J9811 Atelectasis: Secondary | ICD-10-CM | POA: Diagnosis present

## 2024-04-12 DIAGNOSIS — G629 Polyneuropathy, unspecified: Secondary | ICD-10-CM | POA: Diagnosis present

## 2024-04-12 DIAGNOSIS — I10 Essential (primary) hypertension: Secondary | ICD-10-CM | POA: Diagnosis not present

## 2024-04-12 DIAGNOSIS — R9389 Abnormal findings on diagnostic imaging of other specified body structures: Secondary | ICD-10-CM | POA: Diagnosis not present

## 2024-04-12 DIAGNOSIS — I2510 Atherosclerotic heart disease of native coronary artery without angina pectoris: Secondary | ICD-10-CM | POA: Diagnosis not present

## 2024-04-12 LAB — CBC
HCT: 31.9 % — ABNORMAL LOW (ref 36.0–46.0)
Hemoglobin: 9.8 g/dL — ABNORMAL LOW (ref 12.0–15.0)
MCH: 29.3 pg (ref 26.0–34.0)
MCHC: 30.7 g/dL (ref 30.0–36.0)
MCV: 95.5 fL (ref 80.0–100.0)
Platelets: 202 10*3/uL (ref 150–400)
RBC: 3.34 MIL/uL — ABNORMAL LOW (ref 3.87–5.11)
RDW: 12.7 % (ref 11.5–15.5)
WBC: 6.7 10*3/uL (ref 4.0–10.5)
nRBC: 0 % (ref 0.0–0.2)

## 2024-04-12 LAB — BASIC METABOLIC PANEL WITH GFR
Anion gap: 6 (ref 5–15)
BUN: 17 mg/dL (ref 8–23)
CO2: 28 mmol/L (ref 22–32)
Calcium: 9.2 mg/dL (ref 8.9–10.3)
Chloride: 103 mmol/L (ref 98–111)
Creatinine, Ser: 1.56 mg/dL — ABNORMAL HIGH (ref 0.44–1.00)
GFR, Estimated: 34 mL/min — ABNORMAL LOW (ref >60)
Glucose, Bld: 86 mg/dL (ref 70–99)
Potassium: 4.6 mmol/L (ref 3.5–5.1)
Sodium: 137 mmol/L (ref 135–145)

## 2024-04-12 MED ORDER — SODIUM CHLORIDE 0.9 % IV SOLN: INTRAVENOUS | Status: AC

## 2024-04-12 MED ORDER — SODIUM CHLORIDE 0.9 % IV SOLN: INTRAVENOUS | Status: DC

## 2024-04-12 MED ORDER — DICLOFENAC SODIUM 1 % EX GEL
4.0000 g | Freq: Four times a day (QID) | CUTANEOUS | Status: DC
  Administered 2024-04-12 – 2024-04-13 (×6): 4 g via TOPICAL
  Filled 2024-04-12: qty 100

## 2024-04-12 NOTE — Progress Notes (Signed)
   04/12/24 2030  BiPAP/CPAP/SIPAP  BiPAP/CPAP/SIPAP Pt Type Adult  Reason BIPAP/CPAP not in use Non-compliant (does not want to wear while she is here but does wear at home)

## 2024-04-12 NOTE — Evaluation (Signed)
 Occupational Therapy Evaluation Patient Details Name: Courtney [name] MRN: [id number] DOB: [birth year] Today's Date: 04/12/2024   History of Present Illness   Patient is a 78 year old female who presented with weakness and falls at home. Patient was admitted with AKI, hyponatremia,  PMH: recent bladder stimulator implant on 6/20, CVA, Asthma, HTN, HLD, right side weakness, depression/anxiety, OSA.     Clinical Impressions Patient is a 78 year old female who was admitted for above. Patient was living at home at rollator level with support from husband. Patient was able to engage in transfer from bed to recliner and then [facility] with RW with increased time on O2. Patient unable to maintain without supplemental O2 on this date. Patient was noted to have decreased functional activity tolerance, decreased endurance, decreased standing balance, decreased safety awareness, and decreased knowledge of AD/AE impacting participation in ADLs. Patient would continue to benefit from skilled OT services at this time while admitted and after d/c to address noted deficits in order to improve overall safety and independence in ADLs. Patient will benefit from continued inpatient follow up therapy, <3 hours/day.      If plan is discharge home, recommend the following:   A lot of help with bathing/dressing/bathroom;Assistance with cooking/housework;Direct supervision/assist for medications management;Assist for transportation;Help with stairs or ramp for entrance;Direct supervision/assist for financial management;A lot of help with walking and/or transfers     Functional Status Assessment   Patient has had a recent decline in their functional status and demonstrates the ability to make significant improvements in function in a reasonable and predictable amount of time.     Equipment Recommendations   None recommended by OT      Precautions/Restrictions   Precautions Precautions:  Fall Restrictions Weight Bearing Restrictions Per Provider Order: No     Mobility Bed Mobility Overal bed mobility: Needs Assistance Bed Mobility: Supine to Sit     Supine to sit: Mod assist, HOB elevated, Used rails               Balance Overall balance assessment: Needs assistance Sitting-balance support: Feet supported Sitting balance-Leahy Scale: Poor Sitting balance - Comments: needing cues and min A to regain balance. Postural control: Posterior lean           ADL either performed or assessed with clinical judgement   ADL Overall ADL's : Needs assistance/impaired Eating/Feeding: Modified independent;Sitting   Grooming: Sitting;Wash/dry face;Set up   Upper Body Bathing: Sitting;Minimal assistance   Lower Body Bathing: Sitting/lateral leans;Maximal assistance Lower Body Bathing Details (indicate cue type and reason): uses AE at home normally. Upper Body Dressing : Sitting;Minimal assistance   Lower Body Dressing: Sitting/lateral leans;Total assistance Lower Body Dressing Details (indicate cue type and reason): uses AE at home normally. Toilet Transfer: Minimal assistance;+2 for safety/equipment;Rolling walker (2 wheels);Stand-pivot Statistician Details (indicate cue type and reason): to [facility] and then back to recliner. Toileting- Clothing Manipulation and Hygiene: Sit to/from stand;Maximal assistance Toileting - Clothing Manipulation Details (indicate cue type and reason): unable to let go of walker.             Vision Baseline Vision/History: 1 Wears glasses              Pertinent Vitals/Pain Pain Assessment Pain Assessment: Faces Faces Pain Scale: Hurts little more Pain Location: back Pain Descriptors / Indicators: Grimacing, Constant Pain Intervention(s): Limited activity within patient's tolerance, Monitored during session     Extremity/Trunk Assessment Upper Extremity Assessment Upper Extremity Assessment: Generalized weakness (h/o R  side weakness from CVA)   Lower Extremity Assessment Lower Extremity Assessment: Defer to PT evaluation   Cervical / Trunk Assessment Cervical / Trunk Assessment: Normal      Cognition Arousal: Alert Behavior During Therapy: WFL for tasks assessed/performed Cognition: No apparent impairments             OT - Cognition Comments: husband was present during session. husband has a print out of all medical needs and falls on sheet in room.                 Following commands: Intact                  Home Living Family/patient expects to be discharged to:: Private residence Living Arrangements: Spouse/significant other Available Help at Discharge: Family;Available 24 hours/day Type of Home: Independent living facility Home Access: Level entry     Home Layout: One level     Bathroom Shower/Tub: Producer, television/film/video: Handicapped height     Home Equipment: Rollator (4 wheels);Grab bars - toilet;Grab bars - tub/shower;Shower seat;Cane - single point;Cane - quad   Additional Comments: resides at [facility] ILF      Prior Functioning/Environment Prior Level of Function : Needs assist             Mobility Comments: uses rollator with transport chair follow with husband to get to dinning room. Limited ambulation, getting urinary  treatments ADLs Comments: uses AE for LBdressing/bathing tasks.    OT Problem List: Decreased strength;Decreased safety awareness;Decreased knowledge of precautions;Cardiopulmonary status limiting activity;Decreased activity tolerance;Decreased knowledge of use of DME or AE   OT Treatment/Interventions: Self-care/ADL training;DME and/or AE instruction;Therapeutic activities;Energy conservation;Patient/family education      OT Goals(Current goals can be found in the care plan section)   Acute Rehab OT Goals Patient Stated Goal: to get stronger. OT Goal Formulation: With patient/family Time For Goal Achievement:  04/26/24 Potential to Achieve Goals: Fair   OT Frequency:  Min 2X/week    Co-evaluation   Reason for Co-Treatment: For patient/therapist safety PT goals addressed during session: Mobility/safety with mobility        AM-PAC OT 6 Clicks Daily Activity     Outcome Measure Help from another person eating meals?: A Little Help from another person taking care of personal grooming?: A Little Help from another person toileting, which includes using toliet, bedpan, or urinal?: A Lot Help from another person bathing (including washing, rinsing, drying)?: A Lot Help from another person to put on and taking off regular upper body clothing?: A Little Help from another person to put on and taking off regular lower body clothing?: A Lot 6 Click Score: 15   End of Session Equipment Utilized During Treatment: Gait belt;Rolling walker (2 wheels);Other (comment) [facility]) Nurse Communication: Mobility status;Other (comment) (patient wanting to avoid using pure wic if possible.)  Activity Tolerance: Patient tolerated treatment well Patient left: in chair;with call bell/phone within reach;with chair alarm set  OT Visit Diagnosis: Unsteadiness on feet (R26.81);Other abnormalities of gait and mobility (R26.89)                Time: 8957-8892 OT Time Calculation (min): 25 min Charges:  OT General Charges $OT Visit: 1 Visit OT Evaluation $OT Eval Moderate Complexity: 1 Mod  Merari Pion OTR/L, MS Acute Rehabilitation Department Office# [phone]   Geofm CHRISTELLA Dance 04/12/2024, 12:25 PM

## 2024-04-12 NOTE — TOC Initial Note (Signed)
 Transition of Care [facility]) - Initial/Assessment Note    Patient Details  Name: Courtney [name] MRN: [id number] Date of Birth: [birth year]  [facility] CM/SW Contact:    Hoy DELENA Bigness, LCSW Phone Number: 04/12/2024, 2:40 PM  Clinical Narrative:                 Met with pt and spouse at bedside. Pt lives at [facility] ILF. Pt uses RW at baseline and typically requires some assistance from her spouse. Pt and spouse agreeable to recommendation for SNF placement and share that they have already been in contact with [facility] regarding placement.  Referral has been sent to [facility]. CSW spoke with [place] at [facility] and confirmed plan for DC to SNF. Pt may be medically stable for DC in next 24 hours. Insurance shara has been requested and has been approved from 6/24 to 6/26.   Expected Discharge Plan: Skilled Nursing Facility Barriers to Discharge: No Barriers Identified   Patient Goals and CMS Choice Patient states their goals for this hospitalization and ongoing recovery are:: To go to rehab at [email or web address].gov Compare Post Acute Care list provided to:: Patient Choice offered to / list presented to : Patient [place] ownership interest in [facility].provided to:: Patient    Expected Discharge Plan and Services In-house Referral: Clinical Social Work Discharge Planning Services: NA   Post Acute Care Choice: Skilled Nursing Facility Living arrangements for the past 2 months: Independent Living Facility                 DME Arranged: N/A DME Agency: NA                  Prior Living Arrangements/Services Living arrangements for the past 2 months: Independent Living Facility Lives with:: Spouse Patient language and need for interpreter reviewed:: Yes Do you feel safe going back to the place where you live?: Yes      Need for Family Participation in Patient Care: No (Comment) Care giver support system in place?: Yes (comment) Current  home services: DME (RW) Criminal Activity/Legal Involvement Pertinent to Current Situation/Hospitalization: No - Comment as needed  Activities of Daily Living   ADL Screening (condition at time of admission) Independently performs ADLs?: Yes (appropriate for developmental age) Is the patient deaf or have difficulty hearing?: No Does the patient have difficulty seeing, even when wearing glasses/contacts?: Yes Does the patient have difficulty concentrating, remembering, or making decisions?: Yes  Permission Sought/Granted Permission sought to share information with : Facility Medical sales representative, Family Supports Permission granted to share information with : Yes, Verbal Permission Granted  Share Information with NAME: Scarlett, [place] (Spouse)  [phone]  Permission granted to share info w AGENCY: [facility]        Emotional Assessment Appearance:: Appears stated age Attitude/Demeanor/Rapport: Engaged Affect (typically observed): Accepting Orientation: : Oriented to Self, Oriented to Place, Oriented to  Time, Oriented to Situation Alcohol / Substance Use: Not Applicable Psych Involvement: No (comment)  Admission diagnosis:  Weakness [R53.1] AKI (acute kidney injury) (HCC) [N17.9] Acute kidney injury (HCC) [N17.9] Patient Active Problem List   Diagnosis Date Noted   Acute kidney injury (HCC) 04/11/2024   Moderate persistent asthma with acute exacerbation 04/11/2024   Right hemiparesis (HCC) 02/06/2022   Chronic ulcerative pancolitis (HCC) 02/06/2022   Anxiety 01/02/2022   Chest pain 01/02/2022   Moderate recurrent major depression and anxiety  01/02/2022   History of CVA (cerebrovascular accident) 01/02/2022   Urinary tract infection symptoms  01/02/2022   Diverticulitis 12/07/2021   Acute diverticulitis of intestine 12/07/2021   Diarrhea    COVID-19 infection with colitis 11/18/2021   CAD (coronary artery disease) 11/18/2021   Colitis with rectal bleeding 11/18/2021    Hypertension 04/02/2021   Hyponatremia 04/02/2021   Dyslipidemia 04/02/2021   Hypothyroidism (acquired) 04/02/2021   OSA (obstructive sleep apnea) 04/02/2021   Cervical spondylosis with myelopathy and radiculopathy 03/28/2021   Idiopathic peripheral neuropathy 11/02/2019   Anemia 12/12/2017   PCP:  Ransom Other, MD Pharmacy:   [facility] [id number] - [place], [place] - [address] [place] [address] Phone: [phone] Fax: [phone]     Social Drivers of Health (SDOH) Social History: SDOH Screenings   Food Insecurity: No Food Insecurity (04/11/2024)  Housing: Low Risk  (04/11/2024)  Transportation Needs: No Transportation Needs (04/11/2024)  Utilities: Not At Risk (04/11/2024)  Social Connections: Moderately Isolated (04/11/2024)  Tobacco Use: Low Risk  (04/11/2024)   SDOH Interventions:     Readmission Risk Interventions     No data to display

## 2024-04-12 NOTE — Evaluation (Signed)
 Physical Therapy Evaluation Patient Details Name: Courtney [name] MRN: [id number] DOB: [birth year] Today's Date: 04/12/2024  History of Present Illness  Patient is a 78 year old female who presented with weakness and falls at home. Patient was admitted with AKI, hyponatremia,  PMH: recent bladder stimulator implant on 6/20, CVA, Asthma, HTN, HLD, right side weakness, depression/anxiety, OSA.  Clinical Impression  Pt admitted with above diagnosis.  Pt currently with functional limitations due to the deficits listed below (see PT Problem List). Pt will benefit from acute skilled PT to increase their independence and safety with mobility to allow discharge.     The patient reports several falls/slide to floor recently.  Patient reports recently using transport chair inside the home, states  My legs don't hold me up. The patient stood with min assistance  at [facility] and able to step to transfer to recliner , to/from [facility].  Patient resides with spouse in ILF. Patient will benefit from continued inpatient follow up therapy, <3 hours/day.  SPo2 on 2 L 95%, on RA 88%, replaced on 2 L. Patient will benefit from continued inpatient follow up therapy, <3 hours/day       If plan is discharge home, recommend the following: Two people to help with walking and/or transfers;A lot of help with bathing/dressing/bathroom;Assistance with cooking/housework;Assist for transportation;Help with stairs or ramp for entrance   Can travel by private vehicle        Equipment Recommendations None recommended by PT  Recommendations for Other Services       Functional Status Assessment Patient has had a recent decline in their functional status and demonstrates the ability to make significant improvements in function in a reasonable and predictable amount of time.     Precautions / Restrictions Precautions Precautions: Fall Restrictions Weight Bearing Restrictions Per Provider Order: No      Mobility  Bed  Mobility   Bed Mobility: Supine to Sit     Supine to sit: Mod assist, HOB elevated, Used rails          Transfers Overall transfer level: Needs assistance Equipment used: Rolling walker (2 wheels) Transfers: Sit to/from Stand, Bed to chair/wheelchair/BSC Sit to Stand: Min assist, +2 safety/equipment   Step pivot transfers: Min assist, +2 safety/equipment       General transfer comment: assist to rise, able to to step to recliner then to [facility] then back to recliner.    Ambulation/Gait                  Stairs            Wheelchair Mobility     Tilt Bed    Modified Rankin (Stroke Patients Only)       Balance Overall balance assessment: Needs assistance, History of Falls Sitting-balance support: Feet supported Sitting balance-Leahy Scale: Poor Sitting balance - Comments: needing cues and min A to regain balance. Postural control: Posterior lean, Right lateral lean Standing balance support: During functional activity, Bilateral upper extremity supported, Reliant on assistive device for balance Standing balance-Leahy Scale: Fair Standing balance comment: once standing                             Pertinent Vitals/Pain Pain Assessment Faces Pain Scale: Hurts little more Pain Location: back Pain Descriptors / Indicators: Grimacing, Constant Pain Intervention(s): Monitored during session, Repositioned    Home Living Family/patient expects to be discharged to:: Private residence Living Arrangements: Spouse/significant other Available Help at  Discharge: Family;Available 24 hours/day Type of Home: Independent living facility Home Access: Level entry       Home Layout: One level Home Equipment: Rollator (4 wheels);Grab bars - toilet;Grab bars - tub/shower;Shower seat;Cane - single point;Cane - quad Additional Comments: resides at [facility] ILF    Prior Function Prior Level of Function : Needs assist             Mobility Comments:  uses rollator with transport chair follow with husband to get to dinning room. Limited ambulation, getting urinary  treatments, limited ambuialtion, has had several falls/slide to floor ADLs Comments: uses AE for LBdressing/bathing tasks.     Extremity/Trunk Assessment   Upper Extremity Assessment Upper Extremity Assessment: Generalized weakness    Lower Extremity Assessment Lower Extremity Assessment: Generalized weakness    Cervical / Trunk Assessment Cervical / Trunk Assessment: Normal  Communication        Cognition Arousal: Alert Behavior During Therapy: WFL for tasks assessed/performed                             Following commands: Intact       Cueing       General Comments      Exercises     Assessment/Plan    PT Assessment Patient needs continued PT services  PT Problem List Decreased strength;Decreased mobility;Decreased safety awareness;Decreased activity tolerance;Decreased balance;Decreased knowledge of use of DME       PT Treatment Interventions DME instruction;Therapeutic activities;Gait training;Therapeutic exercise;Functional mobility training;Patient/family education    PT Goals (Current goals can be found in the Care Plan section)  Acute Rehab PT Goals Patient Stated Goal: go home PT Goal Formulation: With patient/family Time For Goal Achievement: 04/26/24 Potential to Achieve Goals: Fair    Frequency Min 2X/week     Co-evaluation PT/OT/SLP Co-Evaluation/Treatment: Yes Reason for Co-Treatment: For patient/therapist safety PT goals addressed during session: Mobility/safety with mobility         AM-PAC PT 6 Clicks Mobility  Outcome Measure Help needed turning from your back to your side while in a flat bed without using bedrails?: A Lot Help needed moving from lying on your back to sitting on the side of a flat bed without using bedrails?: A Lot Help needed moving to and from a bed to a chair (including a wheelchair)?:  A Lot Help needed standing up from a chair using your arms (e.g., wheelchair or bedside chair)?: A Lot Help needed to walk in hospital room?: Total Help needed climbing 3-5 steps with a railing? : Total 6 Click Score: 10    End of Session Equipment Utilized During Treatment: Gait belt Activity Tolerance: Patient tolerated treatment well Patient left: in chair;with call bell/phone within reach;with family/visitor present;with chair alarm set Nurse Communication: Mobility status PT Visit Diagnosis: Unsteadiness on feet (R26.81);Muscle weakness (generalized) (M62.81);Difficulty in walking, not elsewhere classified (R26.2);History of falling (Z91.81)    Time: 8956-8888 PT Time Calculation (min) (ACUTE ONLY): 28 min   Charges:   PT Evaluation $PT Eval Low Complexity: 1 Low   PT General Charges $$ ACUTE PT VISIT: 1 Visit         Darice Potters PT Acute Rehabilitation Services Office [phone]   Potters Darice Norris 04/12/2024, 2:03 PM

## 2024-04-12 NOTE — Progress Notes (Signed)
 PROGRESS NOTE    Courtney [name]  FMW:[id number] DOB: 27-Aug-1946 DOA: 04/11/2024 PCP: Ransom Other, MD   Brief Narrative: Courtney [name] is a 78 y.o. female with a history of asthma, CVA, hypertension, hyperlipidemia, hypothyroidism, depression, anxiety, OSA on CPAP, urinary incontinence s/p bladder stimulator implant.  Patient presented secondary to generalized weakness and a fall at home and found to have evidence of dehydration and hyponatremia. IV fluids started.   Assessment and Plan:  AKI Secondary to dehydration. Creatinine of 2.22 on admission. IV fluids started. Creatinine improved to 1.56 today. -Continue IV fluids  Acute respiratory failure with hypoxia Patient with a documented SpO2 of 85% on room air and associated wheezing; improvement with DuoNeb treatment. Patient weaned to room air. -Ambulatory pulse ox  Hyponatremia Sodium of 128 on admission. Resolved with IV fluids.  Generalized weakness Unclear etiology but patient does have a history of stroke with chronic right sided weakness. Associated fall. -Follow-up PT/OT recommendations  Urinary incontinence Patient is s/p bladder stimulator implant; recently placed. Patient follows with urology.  History of CVA Right-sided hemiparesis Noted. -Continue aspirin  and Lipitor   Hypothyroidism -Continue Synthroid   Moderate persistent asthma Patient is on Trelegy and albuterol  as an outpatient -Continue Brovana and Pulmicort  while inpatient -Continue albuterol   Hyperlipidemia -Continue Lipitor   Primary hypertension Patient is on olmesartan and verapamil  as an outpatient. Olmesartan held secondary to [facility]. -Continue Verapamil   Depression Anxiety Patient is on Wellbutrin  and Lexapro  as an outpatient. Lexapro  held, likely secondary to hyponatremia. -Continue Wellbutrin  -Resume Lexapro   Peripheral neuropathy -Continue gabapentin  -Add Voltaren gel  OSA -Continue CPAP at bedtime    DVT prophylaxis:  Subcutaneous heparin Code Status:   Code Status: Full Code Family Communication: Husband at bedside Disposition Plan: Discharge to ILF versus SNF likely in 24 hours once off IV fluids   Consultants:  None  Procedures:  None  Antimicrobials: None    Subjective: Patient reports not having memory of events last night.  Objective: BP 120/63 (BP Location: Right Arm)   Pulse 68   Temp 97.8 F (36.6 C)   Resp 18   SpO2 98%   Examination:  General exam: Appears calm and comfortable Respiratory system: Clear to auscultation. Respiratory effort normal. Cardiovascular system: S1 & S2 heard, RRR. No murmurs. Gastrointestinal system: Abdomen is nondistended, soft and nontender. Normal bowel sounds heard. Central nervous system: Alert and oriented. No focal neurological deficits. Musculoskeletal: No edema. No calf tenderness. Psychiatry: Judgement and insight appear normal. Mood & affect appropriate.    Data Reviewed: I have personally reviewed following labs and imaging studies  CBC Lab Results  Component Value Date   WBC 6.7 04/12/2024   RBC 3.34 (L) 04/12/2024   HGB 9.8 (L) 04/12/2024   HCT 31.9 (L) 04/12/2024   MCV 95.5 04/12/2024   MCH 29.3 04/12/2024   PLT 202 04/12/2024   MCHC 30.7 04/12/2024   RDW 12.7 04/12/2024   LYMPHSABS 1.8 04/11/2024   MONOABS 1.2 (H) 04/11/2024   EOSABS 0.2 04/11/2024   BASOSABS 0.0 04/11/2024     Last metabolic panel Lab Results  Component Value Date   NA 137 04/12/2024   K 4.6 04/12/2024   CL 103 04/12/2024   CO2 28 04/12/2024   BUN 17 04/12/2024   CREATININE 1.56 (H) 04/12/2024   GLUCOSE 86 04/12/2024   GFRNONAA 34 (L) 04/12/2024   CALCIUM  9.2 04/12/2024   PHOS 2.8 12/10/2021   PROT 6.2 (L) 09/03/2023   ALBUMIN 3.4 (L) 09/03/2023   BILITOT  0.4 09/03/2023   ALKPHOS 63 09/03/2023   AST 17 09/03/2023   ALT 15 09/03/2023   ANIONGAP 6 04/12/2024    GFR: CrCl cannot be calculated (Unknown ideal weight.).  Recent  Results (from the past 240 hours)  Resp panel by RT-PCR (RSV, Flu A&B, Covid) Anterior Nasal Swab     Status: None   Collection Time: 04/11/24  6:42 PM   Specimen: Anterior Nasal Swab  Result Value Ref Range Status   SARS Coronavirus 2 by RT PCR NEGATIVE NEGATIVE Final    Comment: (NOTE) SARS-CoV-2 target nucleic acids are NOT DETECTED.  The SARS-CoV-2 RNA is generally detectable in upper respiratory specimens during the acute phase of infection. The lowest concentration of SARS-CoV-2 viral copies this assay can detect is 138 copies/mL. A negative result does not preclude SARS-Cov-2 infection and should not be used as the sole basis for treatment or other patient management decisions. A negative result may occur with  improper specimen collection/handling, submission of specimen other than nasopharyngeal swab, presence of viral mutation(s) within the areas targeted by this assay, and inadequate number of viral copies(<138 copies/mL). A negative result must be combined with clinical observations, patient history, and epidemiological information. The expected result is Negative.  Fact Sheet for Patients:  BloggerCourse.com  Fact Sheet for Healthcare Providers:  SeriousBroker.it  This test is no t yet approved or cleared by the [place]  FDA and  has been authorized for detection and/or diagnosis of SARS-CoV-2 by FDA under an Emergency Use Authorization (EUA). This EUA will remain  in effect (meaning this test can be used) for the duration of the COVID-19 declaration under Section 564(b)(1) of the Act, 21 U.S.C.section 360bbb-3(b)(1), unless the authorization is terminated  or revoked sooner.       Influenza A by PCR NEGATIVE NEGATIVE Final   Influenza B by PCR NEGATIVE NEGATIVE Final    Comment: (NOTE) The Xpert Xpress SARS-CoV-2/FLU/RSV plus assay is intended as an aid in the diagnosis of influenza from Nasopharyngeal swab  specimens and should not be used as a sole basis for treatment. Nasal washings and aspirates are unacceptable for Xpert Xpress SARS-CoV-2/FLU/RSV testing.  Fact Sheet for Patients: BloggerCourse.com  Fact Sheet for Healthcare Providers: SeriousBroker.it  This test is not yet approved or cleared by the [place]  FDA and has been authorized for detection and/or diagnosis of SARS-CoV-2 by FDA under an Emergency Use Authorization (EUA). This EUA will remain in effect (meaning this test can be used) for the duration of the COVID-19 declaration under Section 564(b)(1) of the Act, 21 U.S.C. section 360bbb-3(b)(1), unless the authorization is terminated or revoked.     Resp Syncytial Virus by PCR NEGATIVE NEGATIVE Final    Comment: (NOTE) Fact Sheet for Patients: BloggerCourse.com  Fact Sheet for Healthcare Providers: SeriousBroker.it  This test is not yet approved or cleared by the [place]  FDA and has been authorized for detection and/or diagnosis of SARS-CoV-2 by FDA under an Emergency Use Authorization (EUA). This EUA will remain in effect (meaning this test can be used) for the duration of the COVID-19 declaration under Section 564(b)(1) of the Act, 21 U.S.C. section 360bbb-3(b)(1), unless the authorization is terminated or revoked.  Performed at [facility], [address], [place], [place] [address]       Radiology Studies: CT Renal Stone Study Result Date: 04/11/2024 CLINICAL DATA:  Generalized weakness following recent bladder procedure EXAM: CT ABDOMEN AND PELVIS WITHOUT CONTRAST TECHNIQUE: Multidetector CT imaging of the abdomen and  pelvis was performed following the standard protocol without IV contrast. RADIATION DOSE REDUCTION: This exam was performed according to the departmental dose-optimization program which includes automated exposure  control, adjustment of the mA and/or kV according to patient size and/or use of iterative reconstruction technique. COMPARISON:  09/03/2023 FINDINGS: Lower chest: Mild atelectatic changes are noted in the bases bilaterally. No sizable parenchymal nodule is seen. Hepatobiliary: Gallbladder has been surgically removed. Liver is within normal limits. Pancreas: Unremarkable. No pancreatic ductal dilatation or surrounding inflammatory changes. Spleen: Normal in size without focal abnormality. Adrenals/Urinary Tract: Adrenal glands are within normal limits. Kidneys are well visualized bilaterally. Punctate renal stone is noted in the lower pole of the right kidney. Ureters are within normal limits. Bladder is well distended. Stomach/Bowel: Scattered diverticular change of the colon is noted. No obstructive changes are seen. The appendix is within normal limits. Stomach is decompressed. Visualized small bowel is unremarkable. Vascular/Lymphatic: No significant vascular findings are present. No enlarged abdominal or pelvic lymph nodes. Reproductive: Status post hysterectomy. No adnexal masses. Other: No abdominal wall hernia or abnormality. No abdominopelvic ascites. Musculoskeletal: InterStim device is noted on the right. The placement appears to be within normal limits. Postsurgical changes in the lower lumbar spine are seen. IMPRESSION: No acute abnormality noted. Diverticulosis without diverticulitis. Electronically Signed   By: Oneil Devonshire M.D.   On: 04/11/2024 18:22   DG Chest Port 1 View Result Date: 04/11/2024 CLINICAL DATA:  Hypoxia EXAM: PORTABLE CHEST 1 VIEW COMPARISON:  chest x-ray 10/13/2023 FINDINGS: There is stable elevation of the right hemidiaphragm. The heart size and mediastinal contours are within normal limits. Both lungs are clear. The visualized skeletal structures are unremarkable. IMPRESSION: No active disease. Electronically Signed   By: Greig Pique M.D.   On: 04/11/2024 18:12   DG Hip  Unilat W or Wo Pelvis 2-3 Views Right Result Date: 04/11/2024 CLINICAL DATA:  Fall. EXAM: DG HIP (WITH OR WITHOUT PELVIS) 2-3V RIGHT; LUMBAR SPINE - COMPLETE 4+ VIEW COMPARISON:  04/23/2023. FINDINGS: Right hip: There is no evidence of acute hip fracture or dislocation. Mild to moderate degenerative changes are noted at the right hip. A neurostimulator device with lead terminating in the sacral region on the right. Lumbar spine: Scoliosis is noted and there is stable alignment. Stable spinal fusion hardware is noted from L3-L5. Evaluation for fracture is limited due to osteopenia. No obvious acute fracture is seen. Multilevel intervertebral disc space narrowing, degenerative endplate changes, and facet arthropathy are noted. IMPRESSION: 1. Moderate degenerative changes of the right hip with no evidence of acute fracture or dislocation. 2. No obvious acute fracture in the lumbar spine. Examination is limited due to osteopenia and scoliosis. 3. Stable surgical hardware and multilevel degenerative changes in the lumbar spine. Electronically Signed   By: Leita Birmingham M.D.   On: 04/11/2024 17:07   DG Lumbar Spine Complete Result Date: 04/11/2024 CLINICAL DATA:  Fall. EXAM: DG HIP (WITH OR WITHOUT PELVIS) 2-3V RIGHT; LUMBAR SPINE - COMPLETE 4+ VIEW COMPARISON:  04/23/2023. FINDINGS: Right hip: There is no evidence of acute hip fracture or dislocation. Mild to moderate degenerative changes are noted at the right hip. A neurostimulator device with lead terminating in the sacral region on the right. Lumbar spine: Scoliosis is noted and there is stable alignment. Stable spinal fusion hardware is noted from L3-L5. Evaluation for fracture is limited due to osteopenia. No obvious acute fracture is seen. Multilevel intervertebral disc space narrowing, degenerative endplate changes, and facet arthropathy are noted. IMPRESSION:  1. Moderate degenerative changes of the right hip with no evidence of acute fracture or dislocation.  2. No obvious acute fracture in the lumbar spine. Examination is limited due to osteopenia and scoliosis. 3. Stable surgical hardware and multilevel degenerative changes in the lumbar spine. Electronically Signed   By: Leita Birmingham M.D.   On: 04/11/2024 17:07      LOS: 0 days    Elgin Lam, MD [facility] 04/12/2024, 7:51 AM   If 7PM-7AM, please contact night-coverage [email or web address]

## 2024-04-12 NOTE — [place] FL2 (Signed)
 [place]  MEDICAID FL2 LEVEL OF CARE FORM     IDENTIFICATION  Patient Name: Courtney [name] Birthdate: [birth year] Sex: female Admission Date (Current Location): 04/11/2024  [facility] and [place] Number:  Producer, television/film/video and Address:  [facility],  [address], [place] [address]      Provider Number: [id number]  Attending Physician Name and Address:  Briana Elgin LABOR, MD  Relative Name and Phone Number:  Chele, Cornell (Spouse)  [phone]    Current Level of Care: Hospital Recommended Level of Care: Skilled Nursing Facility Prior Approval Number:    Date Approved/Denied:   PASRR Number: [id number]  Discharge Plan: SNF    Current Diagnoses: Patient Active Problem List   Diagnosis Date Noted   Acute kidney injury (HCC) 04/11/2024   Moderate persistent asthma with acute exacerbation 04/11/2024   Right hemiparesis (HCC) 02/06/2022   Chronic ulcerative pancolitis (HCC) 02/06/2022   Anxiety 01/02/2022   Chest pain 01/02/2022   Moderate recurrent major depression and anxiety  01/02/2022   History of CVA (cerebrovascular accident) 01/02/2022   Urinary tract infection symptoms 01/02/2022   Diverticulitis 12/07/2021   Acute diverticulitis of intestine 12/07/2021   Diarrhea    COVID-19 infection with colitis 11/18/2021   CAD (coronary artery disease) 11/18/2021   Colitis with rectal bleeding 11/18/2021   Hypertension 04/02/2021   Hyponatremia 04/02/2021   Dyslipidemia 04/02/2021   Hypothyroidism (acquired) 04/02/2021   OSA (obstructive sleep apnea) 04/02/2021   Cervical spondylosis with myelopathy and radiculopathy 03/28/2021   Idiopathic peripheral neuropathy 11/02/2019   Anemia 12/12/2017    Orientation RESPIRATION BLADDER Height & Weight     Self, Time, Situation, Place  O2 (2L) Incontinent Weight:   Height:     BEHAVIORAL SYMPTOMS/MOOD NEUROLOGICAL BOWEL NUTRITION STATUS      Continent Diet (See discharge summary)  AMBULATORY STATUS  COMMUNICATION OF NEEDS Skin   Limited Assist Verbally Normal                       Personal Care Assistance Level of Assistance  Bathing, Feeding, Dressing Bathing Assistance: Limited assistance Feeding assistance: Independent Dressing Assistance: Limited assistance     Functional Limitations Info  Sight, Hearing, Speech Sight Info: Impaired Hearing Info: Adequate Speech Info: Adequate    SPECIAL CARE FACTORS FREQUENCY  PT (By licensed PT), OT (By licensed OT)     PT Frequency: 5x/wk OT Frequency: 5x/wk            Contractures Contractures Info: Not present    Additional Factors Info  Code Status, Allergies, Psychotropic Code Status Info: FULL Allergies Info: Tetanus Toxoids, Tetanus-diphtheria Toxoids Td, Meloxicam, Phentermine, Sulfa Antibiotics, Zithromax (Azithromycin) Psychotropic Info: See MAR         Current Medications (04/12/2024):  This is the current hospital active medication list Current Facility-Administered Medications  Medication Dose Route Frequency Provider Last Rate Last Admin   0.9 %  sodium chloride  infusion   Intravenous Continuous Briana Elgin LABOR, MD 75 mL/hr at 04/12/24 1122 New Bag at 04/12/24 1122   acetaminophen  (TYLENOL ) tablet 650 mg  650 mg Oral Q6H PRN Patel, Vishal R, MD   650 mg at 04/11/24 2303   Or   acetaminophen  (TYLENOL ) suppository 650 mg  650 mg Rectal Q6H PRN Patel, Vishal R, MD       albuterol  (PROVENTIL ) (2.5 MG/3ML) 0.083% nebulizer solution 2.5 mg  2.5 mg Nebulization Q4H PRN Patel, Vishal R, MD       arformoterol (  BROVANA) nebulizer solution 15 mcg  15 mcg Nebulization BID Patel, Vishal R, MD   15 mcg at 04/12/24 9153   aspirin  tablet 325 mg  325 mg Oral Daily Patel, Vishal R, MD   325 mg at 04/12/24 0911   atorvastatin  (LIPITOR ) tablet 80 mg  80 mg Oral QPM Patel, Vishal R, MD   80 mg at 04/11/24 2128   bisacodyl  (DULCOLAX) EC tablet 5 mg  5 mg Oral Daily PRN Patel, Vishal R, MD       budesonide  (PULMICORT )  nebulizer solution 0.25 mg  0.25 mg Nebulization BID Patel, Vishal R, MD   0.25 mg at 04/12/24 0846   buPROPion  (WELLBUTRIN  SR) 12 hr tablet 150 mg  150 mg Oral Daily Patel, Vishal R, MD   150 mg at 04/12/24 9088   diclofenac Sodium (VOLTAREN) 1 % topical gel 4 g  4 g Topical QID Briana Elgin LABOR, MD   4 g at 04/12/24 1434   docusate sodium  (COLACE) capsule 100 mg  100 mg Oral Daily Patel, Vishal R, MD   100 mg at 04/12/24 9088   gabapentin  (NEURONTIN ) capsule 100 mg  100 mg Oral BID Patel, Vishal R, MD   100 mg at 04/12/24 0911   gabapentin  (NEURONTIN ) capsule 300 mg  300 mg Oral QHS Patel, Vishal R, MD   300 mg at 04/11/24 2128   heparin injection 5,000 Units  5,000 Units Subcutaneous Q8H Patel, Vishal R, MD   5,000 Units at 04/12/24 1432   levothyroxine  (SYNTHROID ) tablet 50 mcg  50 mcg Oral QAC breakfast Patel, Vishal R, MD   50 mcg at 04/12/24 0549   melatonin tablet 5 mg  5 mg Oral QHS Patel, Vishal R, MD   5 mg at 04/11/24 2128   montelukast  (SINGULAIR ) tablet 10 mg  10 mg Oral QHS Patel, Vishal R, MD   10 mg at 04/11/24 2128   ondansetron  (ZOFRAN ) tablet 4 mg  4 mg Oral Q6H PRN Patel, Vishal R, MD       Or   ondansetron  (ZOFRAN ) injection 4 mg  4 mg Intravenous Q6H PRN Patel, Vishal R, MD       pantoprazole  (PROTONIX ) EC tablet 40 mg  40 mg Oral Daily Patel, Vishal R, MD   40 mg at 04/12/24 0911   senna-docusate (Senokot-S) tablet 1 tablet  1 tablet Oral QHS PRN Patel, Vishal R, MD       verapamil  (CALAN -SR) CR tablet 360 mg  360 mg Oral Daily Patel, Vishal R, MD   360 mg at 04/12/24 9089     Discharge Medications: Please see discharge summary for a list of discharge medications.  Relevant Imaging Results:  Relevant Lab Results:   Additional Information SSN: [id number]  [place]oy LABOR Bigness, LCSW

## 2024-04-13 ENCOUNTER — Inpatient Hospital Stay (HOSPITAL_COMMUNITY)

## 2024-04-13 DIAGNOSIS — M6281 Muscle weakness (generalized): Secondary | ICD-10-CM | POA: Diagnosis not present

## 2024-04-13 DIAGNOSIS — G4733 Obstructive sleep apnea (adult) (pediatric): Secondary | ICD-10-CM | POA: Diagnosis not present

## 2024-04-13 DIAGNOSIS — K219 Gastro-esophageal reflux disease without esophagitis: Secondary | ICD-10-CM | POA: Diagnosis not present

## 2024-04-13 DIAGNOSIS — Z7401 Bed confinement status: Secondary | ICD-10-CM | POA: Diagnosis not present

## 2024-04-13 DIAGNOSIS — G5600 Carpal tunnel syndrome, unspecified upper limb: Secondary | ICD-10-CM | POA: Diagnosis not present

## 2024-04-13 DIAGNOSIS — I10 Essential (primary) hypertension: Secondary | ICD-10-CM | POA: Diagnosis not present

## 2024-04-13 DIAGNOSIS — I2510 Atherosclerotic heart disease of native coronary artery without angina pectoris: Secondary | ICD-10-CM | POA: Diagnosis not present

## 2024-04-13 DIAGNOSIS — E785 Hyperlipidemia, unspecified: Secondary | ICD-10-CM | POA: Diagnosis not present

## 2024-04-13 DIAGNOSIS — Z743 Need for continuous supervision: Secondary | ICD-10-CM | POA: Diagnosis not present

## 2024-04-13 DIAGNOSIS — E039 Hypothyroidism, unspecified: Secondary | ICD-10-CM | POA: Diagnosis not present

## 2024-04-13 DIAGNOSIS — J4541 Moderate persistent asthma with (acute) exacerbation: Secondary | ICD-10-CM | POA: Diagnosis not present

## 2024-04-13 DIAGNOSIS — I639 Cerebral infarction, unspecified: Secondary | ICD-10-CM | POA: Diagnosis not present

## 2024-04-13 DIAGNOSIS — I5032 Chronic diastolic (congestive) heart failure: Secondary | ICD-10-CM | POA: Diagnosis not present

## 2024-04-13 DIAGNOSIS — R531 Weakness: Secondary | ICD-10-CM | POA: Diagnosis not present

## 2024-04-13 DIAGNOSIS — R2689 Other abnormalities of gait and mobility: Secondary | ICD-10-CM | POA: Diagnosis not present

## 2024-04-13 DIAGNOSIS — N179 Acute kidney failure, unspecified: Secondary | ICD-10-CM | POA: Diagnosis not present

## 2024-04-13 DIAGNOSIS — R0902 Hypoxemia: Secondary | ICD-10-CM | POA: Diagnosis not present

## 2024-04-13 DIAGNOSIS — R9389 Abnormal findings on diagnostic imaging of other specified body structures: Secondary | ICD-10-CM | POA: Diagnosis not present

## 2024-04-13 DIAGNOSIS — J45909 Unspecified asthma, uncomplicated: Secondary | ICD-10-CM | POA: Diagnosis not present

## 2024-04-13 DIAGNOSIS — R0989 Other specified symptoms and signs involving the circulatory and respiratory systems: Secondary | ICD-10-CM | POA: Diagnosis not present

## 2024-04-13 DIAGNOSIS — R278 Other lack of coordination: Secondary | ICD-10-CM | POA: Diagnosis not present

## 2024-04-13 LAB — CBC
HCT: 37.9 % (ref 36.0–46.0)
Hemoglobin: 11.2 g/dL — ABNORMAL LOW (ref 12.0–15.0)
MCH: 29.1 pg (ref 26.0–34.0)
MCHC: 29.6 g/dL — ABNORMAL LOW (ref 30.0–36.0)
MCV: 98.4 fL (ref 80.0–100.0)
Platelets: 211 10*3/uL (ref 150–400)
RBC: 3.85 MIL/uL — ABNORMAL LOW (ref 3.87–5.11)
RDW: 13 % (ref 11.5–15.5)
WBC: 8.3 10*3/uL (ref 4.0–10.5)
nRBC: 0 % (ref 0.0–0.2)

## 2024-04-13 LAB — BASIC METABOLIC PANEL WITH GFR
Anion gap: 9 (ref 5–15)
BUN: 16 mg/dL (ref 8–23)
CO2: 25 mmol/L (ref 22–32)
Calcium: 9.5 mg/dL (ref 8.9–10.3)
Chloride: 106 mmol/L (ref 98–111)
Creatinine, Ser: 1.23 mg/dL — ABNORMAL HIGH (ref 0.44–1.00)
GFR, Estimated: 46 mL/min — ABNORMAL LOW (ref >60)
Glucose, Bld: 84 mg/dL (ref 70–99)
Potassium: 4.8 mmol/L (ref 3.5–5.1)
Sodium: 140 mmol/L (ref 135–145)

## 2024-04-13 NOTE — Discharge Summary (Signed)
 Physician Discharge Summary   Patient: Courtney [name] MRN: [id number] DOB: 15-Jan-1946  Admit date:     04/11/2024  Discharge date: 04/13/24  Discharge Physician: Elgin Lam, MD   PCP: Ransom Other, MD   Recommendations at discharge:  PCP visit for hospital follow-up to address medications  Discharge Diagnoses: Principal Problem:   Acute kidney injury [facility]) Active Problems:   Moderate persistent asthma with acute exacerbation   Hyponatremia   Hypertension   Hypothyroidism (acquired)   Moderate recurrent major depression and anxiety    History of CVA (cerebrovascular accident)   Dyslipidemia   OSA (obstructive sleep apnea)  Resolved Problems:   * No resolved hospital problems. *  Hospital Course: Courtney [name] is a 78 y.o. female with a history of asthma, CVA, hypertension, hyperlipidemia, hypothyroidism, depression, anxiety, OSA on CPAP, urinary incontinence s/p bladder stimulator implant.  Patient presented secondary to generalized weakness and a fall at home and found to have evidence of dehydration and hyponatremia. IV fluids started. Patient improved prior to discharge.  Assessment and Plan:  AKI Secondary to dehydration. Creatinine of 2.22 on admission. IV fluids started. Creatinine improved to 1.23 on day of discharge.   Acute respiratory failure with hypoxia Patient with a documented SpO2 of 85% on room air and associated wheezing; improvement with DuoNeb treatment. Patient weaned to room air at rest, however is needing 2 L/min with ambulation. Chest x-ray without acute process. X-ray shows chronically low lung volumes which is the likely etiology. Continue oxygen on discharge.   Hyponatremia Sodium of 128 on admission. Resolved with IV fluids.   Generalized weakness Unclear etiology but patient does have a history of stroke with chronic right sided weakness. Associated fall. PT/OT recommending SNF.   Urinary incontinence Patient is s/p bladder stimulator  implant; recently placed. Patient follows with urology.   History of CVA Right-sided hemiparesis Noted. Continue aspirin  and Lipitor    Hypothyroidism Continue Synthroid    Moderate persistent asthma Patient is on Trelegy and albuterol  as an outpatient Continue regimen on discharge.   Hyperlipidemia Continue Lipitor    Primary hypertension Patient is on olmesartan and verapamil  as an outpatient. Olmesartan held secondary to AKI. Blood pressure controlled on verapamil  alone. Hold olmesartan on discharge. Continue verapamil .   Depression Anxiety Patient is on Ativan , Wellbutrin  and Lexapro  as an outpatient. Lexapro  held, likely secondary to hyponatremia. Ativan  held on admission. Discharge with Wellbutrin  and Lexapro . Reevaluate need for benzodiazepine use; held on discharge.   Peripheral neuropathy Continue gabapentin  and add Voltaren gel   OSA Continue CPAP at bedtime    Consultants: None Procedures performed: None  Disposition: Skilled nursing facility Diet recommendation: Cardiac diet   DISCHARGE MEDICATION: Allergies as of 04/13/2024       Reactions   Tetanus Toxoids Anaphylaxis   Tetanus-diphtheria Toxoids Td Anaphylaxis   Meloxicam Nausea And Vomiting   Phentermine Nausea And Vomiting   Sulfa Antibiotics Diarrhea, Nausea And Vomiting   Zithromax [azithromycin] Diarrhea, Nausea And Vomiting        Medication List     PAUSE taking these medications    furosemide  40 MG tablet Wait to take this until your doctor or other care provider tells you to start again. Commonly known as: LASIX  Take 40 mg by mouth daily as needed for fluid or edema.   LORazepam  1 MG tablet Wait to take this until your doctor or other care provider tells you to start again. Commonly known as: ATIVAN  Take 1 tablet (1 mg total) by mouth at  bedtime.   olmesartan 40 MG tablet Wait to take this until your doctor or other care provider tells you to start again. Commonly known as:  BENICAR Take 40 mg by mouth daily.   Wegovy 0.25 MG/0.5ML Soaj Wait to take this until your doctor or other care provider tells you to start again. Generic drug: Semaglutide-Weight Management Inject 0.25 mg into the skin once a week.       STOP taking these medications    azelastine 0.1 % nasal spray Commonly known as: ASTELIN   HYDROcodone -acetaminophen  5-325 MG tablet Commonly known as: NORCO/VICODIN   promethazine  25 MG tablet Commonly known as: PHENERGAN        TAKE these medications    albuterol  108 (90 Base) MCG/ACT inhaler Commonly known as: VENTOLIN  HFA Inhale 1 puff into the lungs every 6 (six) hours as needed for wheezing or shortness of breath.   aspirin  325 MG tablet Take 325 mg by mouth daily.   atorvastatin  80 MG tablet Commonly known as: LIPITOR  Take 80 mg by mouth every evening.   Biotin 5000 MCG Caps Take 5,000 mcg by mouth daily.   buPROPion  150 MG 12 hr tablet Commonly known as: WELLBUTRIN  SR Take 150 mg by mouth daily.   CALCIUM  600/VITAMIN D  PO Take 1 tablet by mouth daily.   carboxymethylcellulose 0.5 % Soln Commonly known as: REFRESH PLUS Place 1 drop into both eyes daily as needed (dry eyes).   cyanocobalamin  1000 MCG/ML injection Commonly known as: VITAMIN B12 Inject 1,000 mcg into the muscle every 30 (thirty) days.   diclofenac Sodium 1 % Gel Commonly known as: VOLTAREN Apply 2 g topically 4 (four) times daily as needed (pain).   docusate sodium  100 MG capsule Commonly known as: COLACE Take 100 mg by mouth daily.   escitalopram  20 MG tablet Commonly known as: LEXAPRO  Take 20 mg by mouth daily.   gabapentin  300 MG capsule Commonly known as: NEURONTIN  Take 300 mg by mouth at bedtime. To be taken in addition to 100mg  capsule in the morning and 100mg  capsule in the afternoon for a total daily dose of 500mg    gabapentin  100 MG capsule Commonly known as: NEURONTIN  Take 100 mg by mouth 2 (two) times daily. Take 100mg  in the  morning and 100mg  in the afternoon. To be taken in addition to 300mg  capsule nightly for a total daily dose of 500mg    ipratropium 0.03 % nasal spray Commonly known as: ATROVENT Place 1 spray into both nostrils daily.   levothyroxine  50 MCG tablet Commonly known as: SYNTHROID  Take 50 mcg by mouth daily before breakfast.   meclizine  25 MG tablet Commonly known as: ANTIVERT  Take 25 mg by mouth every 8 (eight) hours as needed for dizziness.   melatonin 5 MG Tabs Take 5 mg by mouth at bedtime.   montelukast  10 MG tablet Commonly known as: SINGULAIR  Take 10 mg by mouth at bedtime.   multivitamin-lutein Caps capsule Take 1 capsule by mouth daily.   pantoprazole  40 MG tablet Commonly known as: PROTONIX  Take 40 mg by mouth daily.   PRESCRIPTION MEDICATION CPAP- At bedtime   Trelegy Ellipta 100-62.5-25 MCG/ACT Aepb Generic drug: Fluticasone -Umeclidin-Vilant Inhale 1 puff into the lungs daily.   verapamil  120 MG CR tablet Commonly known as: CALAN -SR Take 360 mg by mouth daily.   Vitamin D3 25 MCG (1000 UT) Caps Take 1,000 Units by mouth daily.        Follow-up Information     Ransom Other, MD. Schedule an appointment  as soon as possible for a visit in 1 week(s).   Specialty: Internal Medicine Why: For hospital follow-up Contact information: [address] [place] [address] [phone]                Discharge Exam: BP 125/68 (BP Location: Right Arm)   Pulse 69   Temp 97.8 F (36.6 C) (Oral)   Resp 18   SpO2 96%   General exam: Appears calm and comfortable Respiratory system: Clear to auscultation. Respiratory effort normal. Cardiovascular system: S1 & S2 heard, RRR. Gastrointestinal system: Abdomen is nondistended, soft and nontender. Normal bowel sounds heard. Central nervous system: Alert and oriented. No focal neurological deficits. Musculoskeletal: No calf tenderness Psychiatry: Judgement and insight appear normal. Mood & affect  appropriate.   Condition at discharge: stable  The results of significant diagnostics from this hospitalization (including imaging, microbiology, ancillary and laboratory) are listed below for reference.   Imaging Studies: CT Renal Stone Study Result Date: 04/11/2024 CLINICAL DATA:  Generalized weakness following recent bladder procedure EXAM: CT ABDOMEN AND PELVIS WITHOUT CONTRAST TECHNIQUE: Multidetector CT imaging of the abdomen and pelvis was performed following the standard protocol without IV contrast. RADIATION DOSE REDUCTION: This exam was performed according to the departmental dose-optimization program which includes automated exposure control, adjustment of the mA and/or kV according to patient size and/or use of iterative reconstruction technique. COMPARISON:  09/03/2023 FINDINGS: Lower chest: Mild atelectatic changes are noted in the bases bilaterally. No sizable parenchymal nodule is seen. Hepatobiliary: Gallbladder has been surgically removed. Liver is within normal limits. Pancreas: Unremarkable. No pancreatic ductal dilatation or surrounding inflammatory changes. Spleen: Normal in size without focal abnormality. Adrenals/Urinary Tract: Adrenal glands are within normal limits. Kidneys are well visualized bilaterally. Punctate renal stone is noted in the lower pole of the right kidney. Ureters are within normal limits. Bladder is well distended. Stomach/Bowel: Scattered diverticular change of the colon is noted. No obstructive changes are seen. The appendix is within normal limits. Stomach is decompressed. Visualized small bowel is unremarkable. Vascular/Lymphatic: No significant vascular findings are present. No enlarged abdominal or pelvic lymph nodes. Reproductive: Status post hysterectomy. No adnexal masses. Other: No abdominal wall hernia or abnormality. No abdominopelvic ascites. Musculoskeletal: InterStim device is noted on the right. The placement appears to be within normal limits.  Postsurgical changes in the lower lumbar spine are seen. IMPRESSION: No acute abnormality noted. Diverticulosis without diverticulitis. Electronically Signed   By: Oneil Devonshire M.D.   On: 04/11/2024 18:22   DG Chest Port 1 View Result Date: 04/11/2024 CLINICAL DATA:  Hypoxia EXAM: PORTABLE CHEST 1 VIEW COMPARISON:  chest x-ray 10/13/2023 FINDINGS: There is stable elevation of the right hemidiaphragm. The heart size and mediastinal contours are within normal limits. Both lungs are clear. The visualized skeletal structures are unremarkable. IMPRESSION: No active disease. Electronically Signed   By: Greig Pique M.D.   On: 04/11/2024 18:12   DG Hip Unilat W or Wo Pelvis 2-3 Views Right Result Date: 04/11/2024 CLINICAL DATA:  Fall. EXAM: DG HIP (WITH OR WITHOUT PELVIS) 2-3V RIGHT; LUMBAR SPINE - COMPLETE 4+ VIEW COMPARISON:  04/23/2023. FINDINGS: Right hip: There is no evidence of acute hip fracture or dislocation. Mild to moderate degenerative changes are noted at the right hip. A neurostimulator device with lead terminating in the sacral region on the right. Lumbar spine: Scoliosis is noted and there is stable alignment. Stable spinal fusion hardware is noted from L3-L5. Evaluation for fracture is limited due to osteopenia. No  obvious acute fracture is seen. Multilevel intervertebral disc space narrowing, degenerative endplate changes, and facet arthropathy are noted. IMPRESSION: 1. Moderate degenerative changes of the right hip with no evidence of acute fracture or dislocation. 2. No obvious acute fracture in the lumbar spine. Examination is limited due to osteopenia and scoliosis. 3. Stable surgical hardware and multilevel degenerative changes in the lumbar spine. Electronically Signed   By: Leita Birmingham M.D.   On: 04/11/2024 17:07   DG Lumbar Spine Complete Result Date: 04/11/2024 CLINICAL DATA:  Fall. EXAM: DG HIP (WITH OR WITHOUT PELVIS) 2-3V RIGHT; LUMBAR SPINE - COMPLETE 4+ VIEW COMPARISON:   04/23/2023. FINDINGS: Right hip: There is no evidence of acute hip fracture or dislocation. Mild to moderate degenerative changes are noted at the right hip. A neurostimulator device with lead terminating in the sacral region on the right. Lumbar spine: Scoliosis is noted and there is stable alignment. Stable spinal fusion hardware is noted from L3-L5. Evaluation for fracture is limited due to osteopenia. No obvious acute fracture is seen. Multilevel intervertebral disc space narrowing, degenerative endplate changes, and facet arthropathy are noted. IMPRESSION: 1. Moderate degenerative changes of the right hip with no evidence of acute fracture or dislocation. 2. No obvious acute fracture in the lumbar spine. Examination is limited due to osteopenia and scoliosis. 3. Stable surgical hardware and multilevel degenerative changes in the lumbar spine. Electronically Signed   By: Leita Birmingham M.D.   On: 04/11/2024 17:07    Microbiology: Results for orders placed or performed during the hospital encounter of 04/11/24  Resp panel by RT-PCR (RSV, Flu A&B, Covid) Anterior Nasal Swab     Status: None   Collection Time: 04/11/24  6:42 PM   Specimen: Anterior Nasal Swab  Result Value Ref Range Status   SARS Coronavirus 2 by RT PCR NEGATIVE NEGATIVE Final    Comment: (NOTE) SARS-CoV-2 target nucleic acids are NOT DETECTED.  The SARS-CoV-2 RNA is generally detectable in upper respiratory specimens during the acute phase of infection. The lowest concentration of SARS-CoV-2 viral copies this assay can detect is 138 copies/mL. A negative result does not preclude SARS-Cov-2 infection and should not be used as the sole basis for treatment or other patient management decisions. A negative result may occur with  improper specimen collection/handling, submission of specimen other than nasopharyngeal swab, presence of viral mutation(s) within the areas targeted by this assay, and inadequate number of  viral copies(<138 copies/mL). A negative result must be combined with clinical observations, patient history, and epidemiological information. The expected result is Negative.  Fact Sheet for Patients:  BloggerCourse.com  Fact Sheet for Healthcare Providers:  SeriousBroker.it  This test is no t yet approved or cleared by the [place]  FDA and  has been authorized for detection and/or diagnosis of SARS-CoV-2 by FDA under an Emergency Use Authorization (EUA). This EUA will remain  in effect (meaning this test can be used) for the duration of the COVID-19 declaration under Section 564(b)(1) of the Act, 21 U.S.C.section 360bbb-3(b)(1), unless the authorization is terminated  or revoked sooner.       Influenza A by PCR NEGATIVE NEGATIVE Final   Influenza B by PCR NEGATIVE NEGATIVE Final    Comment: (NOTE) The Xpert Xpress SARS-CoV-2/FLU/RSV plus assay is intended as an aid in the diagnosis of influenza from Nasopharyngeal swab specimens and should not be used as a sole basis for treatment. Nasal washings and aspirates are unacceptable for Xpert Xpress SARS-CoV-2/FLU/RSV testing.  Fact Sheet for Patients:  BloggerCourse.com  Fact Sheet for Healthcare Providers: SeriousBroker.it  This test is not yet approved or cleared by the [place]  FDA and has been authorized for detection and/or diagnosis of SARS-CoV-2 by FDA under an Emergency Use Authorization (EUA). This EUA will remain in effect (meaning this test can be used) for the duration of the COVID-19 declaration under Section 564(b)(1) of the Act, 21 U.S.C. section 360bbb-3(b)(1), unless the authorization is terminated or revoked.     Resp Syncytial Virus by PCR NEGATIVE NEGATIVE Final    Comment: (NOTE) Fact Sheet for Patients: BloggerCourse.com  Fact Sheet for Healthcare  Providers: SeriousBroker.it  This test is not yet approved or cleared by the [place]  FDA and has been authorized for detection and/or diagnosis of SARS-CoV-2 by FDA under an Emergency Use Authorization (EUA). This EUA will remain in effect (meaning this test can be used) for the duration of the COVID-19 declaration under Section 564(b)(1) of the Act, 21 U.S.C. section 360bbb-3(b)(1), unless the authorization is terminated or revoked.  Performed at [facility], [address] [place], [place] [address]     Labs: CBC: Recent Labs  Lab 04/11/24 1516 04/12/24 0542 04/13/24 0515  WBC 9.1 6.7 8.3  NEUTROABS 5.9  --   --   HGB 11.1* 9.8* 11.2*  HCT 36.0 31.9* 37.9  MCV 96.0 95.5 98.4  PLT 232 202 211   Basic Metabolic Panel: Recent Labs  Lab 04/11/24 1516 04/12/24 0542 04/13/24 0515  NA 128* 137 140  K 4.5 4.6 4.8  CL 95* 103 106  CO2 25 28 25   GLUCOSE 88 86 84  BUN 20 17 16   CREATININE 2.22* 1.56* 1.23*  CALCIUM  9.1 9.2 9.5    Discharge time spent: 35 minutes.  Signed: Elgin Lam, MD [facility] 04/13/2024

## 2024-04-13 NOTE — Plan of Care (Signed)

## 2024-04-13 NOTE — TOC Transition Note (Addendum)
 Transition of Care [facility]) - Discharge Note   Patient Details  Name: Courtney [name] MRN: [id number] Date of Birth: January 26, 1946  Transition of Care [facility]) CM/SW Contact:  Sheri ONEIDA Sharps, LCSW Phone Number: 04/13/2024, 3:56 PM   Clinical Narrative:    Pt medically ready to dc to [facility] for SNF. Call report [phone] room 404B given to rn. DC packet left at nurses station. PTAR called at 3:45pm. No further TOC needs.    Final next level of care: Skilled Nursing Facility Barriers to Discharge: No Barriers Identified   Patient Goals and CMS Choice Patient states their goals for this hospitalization and ongoing recovery are:: return to [facility] for SNF CMS Medicare.gov Compare Post Acute Care list provided to:: Patient Choice offered to / list presented to : Patient [place] ownership interest in [facility].provided to:: Patient    Discharge Placement PASRR number recieved: 04/12/24            Patient chooses bed at: [facility] Patient to be transferred to facility by: PTAR Name of family member notified: Tiye, Huwe (Spouse)  [phone] (Mobile) Patient and family notified of of transfer: 04/13/24  Discharge Plan and Services Additional resources added to the After Visit Summary for   In-house Referral: Clinical Social Work Discharge Planning Services: NA Post Acute Care Choice: Skilled Nursing Facility          DME Arranged: N/A DME Agency: NA       HH Arranged: NA HH Agency: NA        Social Drivers of Health (SDOH) Interventions SDOH Screenings   Food Insecurity: No Food Insecurity (04/11/2024)  Housing: Low Risk  (04/11/2024)  Transportation Needs: No Transportation Needs (04/11/2024)  Utilities: Not At Risk (04/11/2024)  Social Connections: Moderately Isolated (04/11/2024)  Tobacco Use: Low Risk  (04/11/2024)     Readmission Risk Interventions    04/13/2024    3:55 PM  Readmission Risk Prevention Plan  Transportation Screening  Complete  PCP or Specialist Appt within 5-7 Days Complete  Home Care Screening Complete  Medication Review (RN CM) Complete

## 2024-04-13 NOTE — Progress Notes (Signed)
   04/13/24 2052  BiPAP/CPAP/SIPAP  BiPAP/CPAP/SIPAP Pt Type Adult  Reason BIPAP/CPAP not in use Other(comment) (Patient is awaiting transportation and states she will wear her CPAP at the facility she is going to.)  BiPAP/CPAP /SiPAP Vitals  Bilateral Breath Sounds Clear

## 2024-04-13 NOTE — Plan of Care (Signed)
  Problem: Education: Goal: Knowledge of General Education information will improve Description: Including pain rating scale, medication(s)/side effects and non-pharmacologic comfort measures Outcome: Progressing   Problem: Health Behavior/Discharge Planning: Goal: Ability to manage health-related needs will improve Outcome: Progressing   Problem: Clinical Measurements: Goal: Ability to maintain clinical measurements within normal limits will improve Outcome: Progressing   Problem: Activity: Goal: Risk for activity intolerance will decrease Outcome: Progressing   Problem: Pain Managment: Goal: General experience of comfort will improve and/or be controlled Outcome: Progressing   Problem: Skin Integrity: Goal: Risk for impaired skin integrity will decrease Outcome: Progressing

## 2024-04-13 NOTE — Discharge Instructions (Signed)
 Courtney [name],  You were in the hospital with confusion/lethargy likely related to being on too much medication. You were also found to have dehydration. Your medications have been adjusted on discharge. Please follow-up with your PCP.

## 2024-04-13 NOTE — Progress Notes (Addendum)
SATURATION QUALIFICATIONS: (This note is used to comply with regulatory documentation for home oxygen) ° °Patient Saturations on Room Air at Rest = 90% ° °Patient Saturations on Room Air while Ambulating = 87% ° °Patient Saturations on 2 Liters of oxygen while Ambulating = 95% ° °Please briefly explain why patient needs home oxygen:  °

## 2024-04-15 DIAGNOSIS — E785 Hyperlipidemia, unspecified: Secondary | ICD-10-CM | POA: Diagnosis not present

## 2024-04-15 DIAGNOSIS — E039 Hypothyroidism, unspecified: Secondary | ICD-10-CM | POA: Diagnosis not present

## 2024-04-15 DIAGNOSIS — I10 Essential (primary) hypertension: Secondary | ICD-10-CM | POA: Diagnosis not present

## 2024-04-15 DIAGNOSIS — G4733 Obstructive sleep apnea (adult) (pediatric): Secondary | ICD-10-CM | POA: Diagnosis not present

## 2024-04-19 DIAGNOSIS — E039 Hypothyroidism, unspecified: Secondary | ICD-10-CM | POA: Diagnosis not present

## 2024-04-19 DIAGNOSIS — J45909 Unspecified asthma, uncomplicated: Secondary | ICD-10-CM | POA: Diagnosis not present

## 2024-04-19 DIAGNOSIS — I5032 Chronic diastolic (congestive) heart failure: Secondary | ICD-10-CM | POA: Diagnosis not present

## 2024-04-19 DIAGNOSIS — I639 Cerebral infarction, unspecified: Secondary | ICD-10-CM | POA: Diagnosis not present

## 2024-04-21 DIAGNOSIS — I10 Essential (primary) hypertension: Secondary | ICD-10-CM | POA: Diagnosis not present

## 2024-04-21 DIAGNOSIS — E785 Hyperlipidemia, unspecified: Secondary | ICD-10-CM | POA: Diagnosis not present

## 2024-04-22 DIAGNOSIS — E785 Hyperlipidemia, unspecified: Secondary | ICD-10-CM | POA: Diagnosis not present

## 2024-04-22 DIAGNOSIS — E039 Hypothyroidism, unspecified: Secondary | ICD-10-CM | POA: Diagnosis not present

## 2024-04-22 DIAGNOSIS — G4733 Obstructive sleep apnea (adult) (pediatric): Secondary | ICD-10-CM | POA: Diagnosis not present

## 2024-04-22 DIAGNOSIS — I10 Essential (primary) hypertension: Secondary | ICD-10-CM | POA: Diagnosis not present

## 2024-04-27 DIAGNOSIS — J45909 Unspecified asthma, uncomplicated: Secondary | ICD-10-CM | POA: Diagnosis not present

## 2024-04-27 DIAGNOSIS — E039 Hypothyroidism, unspecified: Secondary | ICD-10-CM | POA: Diagnosis not present

## 2024-04-27 DIAGNOSIS — I10 Essential (primary) hypertension: Secondary | ICD-10-CM | POA: Diagnosis not present

## 2024-04-27 DIAGNOSIS — N179 Acute kidney failure, unspecified: Secondary | ICD-10-CM | POA: Diagnosis not present

## 2024-04-28 DIAGNOSIS — R2681 Unsteadiness on feet: Secondary | ICD-10-CM | POA: Diagnosis not present

## 2024-04-28 DIAGNOSIS — R278 Other lack of coordination: Secondary | ICD-10-CM | POA: Diagnosis not present

## 2024-04-28 DIAGNOSIS — R2689 Other abnormalities of gait and mobility: Secondary | ICD-10-CM | POA: Diagnosis not present

## 2024-04-28 DIAGNOSIS — M6281 Muscle weakness (generalized): Secondary | ICD-10-CM | POA: Diagnosis not present

## 2024-04-28 DIAGNOSIS — N179 Acute kidney failure, unspecified: Secondary | ICD-10-CM | POA: Diagnosis not present

## 2024-04-29 DIAGNOSIS — R2681 Unsteadiness on feet: Secondary | ICD-10-CM | POA: Diagnosis not present

## 2024-04-29 DIAGNOSIS — R2689 Other abnormalities of gait and mobility: Secondary | ICD-10-CM | POA: Diagnosis not present

## 2024-04-29 DIAGNOSIS — N179 Acute kidney failure, unspecified: Secondary | ICD-10-CM | POA: Diagnosis not present

## 2024-04-29 DIAGNOSIS — R278 Other lack of coordination: Secondary | ICD-10-CM | POA: Diagnosis not present

## 2024-04-29 DIAGNOSIS — M6281 Muscle weakness (generalized): Secondary | ICD-10-CM | POA: Diagnosis not present

## 2024-05-10 DIAGNOSIS — M6281 Muscle weakness (generalized): Secondary | ICD-10-CM | POA: Diagnosis not present

## 2024-05-10 DIAGNOSIS — R2689 Other abnormalities of gait and mobility: Secondary | ICD-10-CM | POA: Diagnosis not present

## 2024-05-10 DIAGNOSIS — R2681 Unsteadiness on feet: Secondary | ICD-10-CM | POA: Diagnosis not present

## 2024-05-10 DIAGNOSIS — R278 Other lack of coordination: Secondary | ICD-10-CM | POA: Diagnosis not present

## 2024-05-10 DIAGNOSIS — N179 Acute kidney failure, unspecified: Secondary | ICD-10-CM | POA: Diagnosis not present

## 2024-05-12 DIAGNOSIS — R2689 Other abnormalities of gait and mobility: Secondary | ICD-10-CM | POA: Diagnosis not present

## 2024-05-12 DIAGNOSIS — R2681 Unsteadiness on feet: Secondary | ICD-10-CM | POA: Diagnosis not present

## 2024-05-12 DIAGNOSIS — R278 Other lack of coordination: Secondary | ICD-10-CM | POA: Diagnosis not present

## 2024-05-12 DIAGNOSIS — N179 Acute kidney failure, unspecified: Secondary | ICD-10-CM | POA: Diagnosis not present

## 2024-05-12 DIAGNOSIS — M6281 Muscle weakness (generalized): Secondary | ICD-10-CM | POA: Diagnosis not present

## 2024-05-13 DIAGNOSIS — M6281 Muscle weakness (generalized): Secondary | ICD-10-CM | POA: Diagnosis not present

## 2024-05-13 DIAGNOSIS — N179 Acute kidney failure, unspecified: Secondary | ICD-10-CM | POA: Diagnosis not present

## 2024-05-13 DIAGNOSIS — R2689 Other abnormalities of gait and mobility: Secondary | ICD-10-CM | POA: Diagnosis not present

## 2024-05-13 DIAGNOSIS — R278 Other lack of coordination: Secondary | ICD-10-CM | POA: Diagnosis not present

## 2024-05-13 DIAGNOSIS — R2681 Unsteadiness on feet: Secondary | ICD-10-CM | POA: Diagnosis not present

## 2024-05-17 DIAGNOSIS — R2689 Other abnormalities of gait and mobility: Secondary | ICD-10-CM | POA: Diagnosis not present

## 2024-05-17 DIAGNOSIS — R278 Other lack of coordination: Secondary | ICD-10-CM | POA: Diagnosis not present

## 2024-05-17 DIAGNOSIS — N179 Acute kidney failure, unspecified: Secondary | ICD-10-CM | POA: Diagnosis not present

## 2024-05-17 DIAGNOSIS — M6281 Muscle weakness (generalized): Secondary | ICD-10-CM | POA: Diagnosis not present

## 2024-05-17 DIAGNOSIS — R2681 Unsteadiness on feet: Secondary | ICD-10-CM | POA: Diagnosis not present

## 2024-05-19 DIAGNOSIS — R2689 Other abnormalities of gait and mobility: Secondary | ICD-10-CM | POA: Diagnosis not present

## 2024-05-19 DIAGNOSIS — R278 Other lack of coordination: Secondary | ICD-10-CM | POA: Diagnosis not present

## 2024-05-19 DIAGNOSIS — R2681 Unsteadiness on feet: Secondary | ICD-10-CM | POA: Diagnosis not present

## 2024-05-19 DIAGNOSIS — M6281 Muscle weakness (generalized): Secondary | ICD-10-CM | POA: Diagnosis not present

## 2024-05-19 DIAGNOSIS — N179 Acute kidney failure, unspecified: Secondary | ICD-10-CM | POA: Diagnosis not present

## 2024-05-20 DIAGNOSIS — I639 Cerebral infarction, unspecified: Secondary | ICD-10-CM | POA: Diagnosis not present

## 2024-05-20 DIAGNOSIS — J45909 Unspecified asthma, uncomplicated: Secondary | ICD-10-CM | POA: Diagnosis not present

## 2024-05-20 DIAGNOSIS — I5032 Chronic diastolic (congestive) heart failure: Secondary | ICD-10-CM | POA: Diagnosis not present

## 2024-05-20 DIAGNOSIS — E039 Hypothyroidism, unspecified: Secondary | ICD-10-CM | POA: Diagnosis not present

## 2024-05-24 DIAGNOSIS — N179 Acute kidney failure, unspecified: Secondary | ICD-10-CM | POA: Diagnosis not present

## 2024-05-24 DIAGNOSIS — R2689 Other abnormalities of gait and mobility: Secondary | ICD-10-CM | POA: Diagnosis not present

## 2024-05-24 DIAGNOSIS — M6281 Muscle weakness (generalized): Secondary | ICD-10-CM | POA: Diagnosis not present

## 2024-05-24 DIAGNOSIS — R278 Other lack of coordination: Secondary | ICD-10-CM | POA: Diagnosis not present

## 2024-05-24 DIAGNOSIS — R2681 Unsteadiness on feet: Secondary | ICD-10-CM | POA: Diagnosis not present

## 2024-05-25 DIAGNOSIS — R2689 Other abnormalities of gait and mobility: Secondary | ICD-10-CM | POA: Diagnosis not present

## 2024-05-25 DIAGNOSIS — M6281 Muscle weakness (generalized): Secondary | ICD-10-CM | POA: Diagnosis not present

## 2024-05-25 DIAGNOSIS — N179 Acute kidney failure, unspecified: Secondary | ICD-10-CM | POA: Diagnosis not present

## 2024-05-25 DIAGNOSIS — R278 Other lack of coordination: Secondary | ICD-10-CM | POA: Diagnosis not present

## 2024-05-25 DIAGNOSIS — R2681 Unsteadiness on feet: Secondary | ICD-10-CM | POA: Diagnosis not present

## 2024-05-26 DIAGNOSIS — H8110 Benign paroxysmal vertigo, unspecified ear: Secondary | ICD-10-CM | POA: Diagnosis not present

## 2024-05-26 DIAGNOSIS — R109 Unspecified abdominal pain: Secondary | ICD-10-CM | POA: Diagnosis not present

## 2024-05-26 DIAGNOSIS — R269 Unspecified abnormalities of gait and mobility: Secondary | ICD-10-CM | POA: Diagnosis not present

## 2024-06-01 DIAGNOSIS — R278 Other lack of coordination: Secondary | ICD-10-CM | POA: Diagnosis not present

## 2024-06-01 DIAGNOSIS — N179 Acute kidney failure, unspecified: Secondary | ICD-10-CM | POA: Diagnosis not present

## 2024-06-01 DIAGNOSIS — M6281 Muscle weakness (generalized): Secondary | ICD-10-CM | POA: Diagnosis not present

## 2024-06-01 DIAGNOSIS — R2689 Other abnormalities of gait and mobility: Secondary | ICD-10-CM | POA: Diagnosis not present

## 2024-06-01 DIAGNOSIS — R2681 Unsteadiness on feet: Secondary | ICD-10-CM | POA: Diagnosis not present

## 2024-06-02 DIAGNOSIS — R2689 Other abnormalities of gait and mobility: Secondary | ICD-10-CM | POA: Diagnosis not present

## 2024-06-02 DIAGNOSIS — M6281 Muscle weakness (generalized): Secondary | ICD-10-CM | POA: Diagnosis not present

## 2024-06-02 DIAGNOSIS — R278 Other lack of coordination: Secondary | ICD-10-CM | POA: Diagnosis not present

## 2024-06-02 DIAGNOSIS — R2681 Unsteadiness on feet: Secondary | ICD-10-CM | POA: Diagnosis not present

## 2024-06-02 DIAGNOSIS — N179 Acute kidney failure, unspecified: Secondary | ICD-10-CM | POA: Diagnosis not present

## 2024-06-03 DIAGNOSIS — R2681 Unsteadiness on feet: Secondary | ICD-10-CM | POA: Diagnosis not present

## 2024-06-03 DIAGNOSIS — R278 Other lack of coordination: Secondary | ICD-10-CM | POA: Diagnosis not present

## 2024-06-03 DIAGNOSIS — N179 Acute kidney failure, unspecified: Secondary | ICD-10-CM | POA: Diagnosis not present

## 2024-06-03 DIAGNOSIS — M6281 Muscle weakness (generalized): Secondary | ICD-10-CM | POA: Diagnosis not present

## 2024-06-03 DIAGNOSIS — R2689 Other abnormalities of gait and mobility: Secondary | ICD-10-CM | POA: Diagnosis not present

## 2024-06-04 DIAGNOSIS — R278 Other lack of coordination: Secondary | ICD-10-CM | POA: Diagnosis not present

## 2024-06-04 DIAGNOSIS — N179 Acute kidney failure, unspecified: Secondary | ICD-10-CM | POA: Diagnosis not present

## 2024-06-04 DIAGNOSIS — M6281 Muscle weakness (generalized): Secondary | ICD-10-CM | POA: Diagnosis not present

## 2024-06-04 DIAGNOSIS — R2689 Other abnormalities of gait and mobility: Secondary | ICD-10-CM | POA: Diagnosis not present

## 2024-06-04 DIAGNOSIS — R2681 Unsteadiness on feet: Secondary | ICD-10-CM | POA: Diagnosis not present

## 2024-06-07 DIAGNOSIS — R278 Other lack of coordination: Secondary | ICD-10-CM | POA: Diagnosis not present

## 2024-06-07 DIAGNOSIS — M6281 Muscle weakness (generalized): Secondary | ICD-10-CM | POA: Diagnosis not present

## 2024-06-07 DIAGNOSIS — R2689 Other abnormalities of gait and mobility: Secondary | ICD-10-CM | POA: Diagnosis not present

## 2024-06-07 DIAGNOSIS — N179 Acute kidney failure, unspecified: Secondary | ICD-10-CM | POA: Diagnosis not present

## 2024-06-07 DIAGNOSIS — R2681 Unsteadiness on feet: Secondary | ICD-10-CM | POA: Diagnosis not present

## 2024-06-08 DIAGNOSIS — M6281 Muscle weakness (generalized): Secondary | ICD-10-CM | POA: Diagnosis not present

## 2024-06-08 DIAGNOSIS — N179 Acute kidney failure, unspecified: Secondary | ICD-10-CM | POA: Diagnosis not present

## 2024-06-08 DIAGNOSIS — R278 Other lack of coordination: Secondary | ICD-10-CM | POA: Diagnosis not present

## 2024-06-08 DIAGNOSIS — R2681 Unsteadiness on feet: Secondary | ICD-10-CM | POA: Diagnosis not present

## 2024-06-08 DIAGNOSIS — R2689 Other abnormalities of gait and mobility: Secondary | ICD-10-CM | POA: Diagnosis not present

## 2024-06-15 DIAGNOSIS — N179 Acute kidney failure, unspecified: Secondary | ICD-10-CM | POA: Diagnosis not present

## 2024-06-15 DIAGNOSIS — R2681 Unsteadiness on feet: Secondary | ICD-10-CM | POA: Diagnosis not present

## 2024-06-15 DIAGNOSIS — R278 Other lack of coordination: Secondary | ICD-10-CM | POA: Diagnosis not present

## 2024-06-15 DIAGNOSIS — R2689 Other abnormalities of gait and mobility: Secondary | ICD-10-CM | POA: Diagnosis not present

## 2024-06-15 DIAGNOSIS — M6281 Muscle weakness (generalized): Secondary | ICD-10-CM | POA: Diagnosis not present

## 2024-06-17 DIAGNOSIS — N179 Acute kidney failure, unspecified: Secondary | ICD-10-CM | POA: Diagnosis not present

## 2024-06-17 DIAGNOSIS — R278 Other lack of coordination: Secondary | ICD-10-CM | POA: Diagnosis not present

## 2024-06-17 DIAGNOSIS — M6281 Muscle weakness (generalized): Secondary | ICD-10-CM | POA: Diagnosis not present

## 2024-06-17 DIAGNOSIS — R2681 Unsteadiness on feet: Secondary | ICD-10-CM | POA: Diagnosis not present

## 2024-06-17 DIAGNOSIS — R2689 Other abnormalities of gait and mobility: Secondary | ICD-10-CM | POA: Diagnosis not present

## 2024-06-20 DIAGNOSIS — J45909 Unspecified asthma, uncomplicated: Secondary | ICD-10-CM | POA: Diagnosis not present

## 2024-06-20 DIAGNOSIS — E039 Hypothyroidism, unspecified: Secondary | ICD-10-CM | POA: Diagnosis not present

## 2024-06-20 DIAGNOSIS — I639 Cerebral infarction, unspecified: Secondary | ICD-10-CM | POA: Diagnosis not present

## 2024-06-20 DIAGNOSIS — I5032 Chronic diastolic (congestive) heart failure: Secondary | ICD-10-CM | POA: Diagnosis not present

## 2024-06-22 DIAGNOSIS — N179 Acute kidney failure, unspecified: Secondary | ICD-10-CM | POA: Diagnosis not present

## 2024-06-22 DIAGNOSIS — R278 Other lack of coordination: Secondary | ICD-10-CM | POA: Diagnosis not present

## 2024-06-22 DIAGNOSIS — R2681 Unsteadiness on feet: Secondary | ICD-10-CM | POA: Diagnosis not present

## 2024-06-22 DIAGNOSIS — R2689 Other abnormalities of gait and mobility: Secondary | ICD-10-CM | POA: Diagnosis not present

## 2024-06-22 DIAGNOSIS — M6281 Muscle weakness (generalized): Secondary | ICD-10-CM | POA: Diagnosis not present

## 2024-06-23 DIAGNOSIS — R278 Other lack of coordination: Secondary | ICD-10-CM | POA: Diagnosis not present

## 2024-06-23 DIAGNOSIS — R2681 Unsteadiness on feet: Secondary | ICD-10-CM | POA: Diagnosis not present

## 2024-06-23 DIAGNOSIS — M6281 Muscle weakness (generalized): Secondary | ICD-10-CM | POA: Diagnosis not present

## 2024-06-23 DIAGNOSIS — N179 Acute kidney failure, unspecified: Secondary | ICD-10-CM | POA: Diagnosis not present

## 2024-06-23 DIAGNOSIS — R2689 Other abnormalities of gait and mobility: Secondary | ICD-10-CM | POA: Diagnosis not present

## 2024-06-24 DIAGNOSIS — M6281 Muscle weakness (generalized): Secondary | ICD-10-CM | POA: Diagnosis not present

## 2024-06-24 DIAGNOSIS — R2689 Other abnormalities of gait and mobility: Secondary | ICD-10-CM | POA: Diagnosis not present

## 2024-06-24 DIAGNOSIS — R278 Other lack of coordination: Secondary | ICD-10-CM | POA: Diagnosis not present

## 2024-06-24 DIAGNOSIS — N179 Acute kidney failure, unspecified: Secondary | ICD-10-CM | POA: Diagnosis not present

## 2024-06-24 DIAGNOSIS — R2681 Unsteadiness on feet: Secondary | ICD-10-CM | POA: Diagnosis not present

## 2024-06-25 ENCOUNTER — Emergency Department (HOSPITAL_COMMUNITY)

## 2024-06-25 ENCOUNTER — Encounter (HOSPITAL_COMMUNITY): Payer: Self-pay

## 2024-06-25 ENCOUNTER — Emergency Department (HOSPITAL_COMMUNITY)
Admission: EM | Admit: 2024-06-25 | Discharge: 2024-06-25 | Disposition: A | Discharge status: Skilled Nursing Facility | Attending: Emergency Medicine | Admitting: Emergency Medicine

## 2024-06-25 ENCOUNTER — Other Ambulatory Visit: Payer: Self-pay

## 2024-06-25 DIAGNOSIS — I10 Essential (primary) hypertension: Secondary | ICD-10-CM | POA: Diagnosis not present

## 2024-06-25 DIAGNOSIS — M25551 Pain in right hip: Secondary | ICD-10-CM | POA: Diagnosis present

## 2024-06-25 DIAGNOSIS — Z8673 Personal history of transient ischemic attack (TIA), and cerebral infarction without residual deficits: Secondary | ICD-10-CM | POA: Diagnosis not present

## 2024-06-25 DIAGNOSIS — S7001XA Contusion of right hip, initial encounter: Secondary | ICD-10-CM | POA: Diagnosis not present

## 2024-06-25 DIAGNOSIS — Z981 Arthrodesis status: Secondary | ICD-10-CM | POA: Diagnosis not present

## 2024-06-25 DIAGNOSIS — I6789 Other cerebrovascular disease: Secondary | ICD-10-CM | POA: Insufficient documentation

## 2024-06-25 DIAGNOSIS — Z043 Encounter for examination and observation following other accident: Secondary | ICD-10-CM | POA: Diagnosis not present

## 2024-06-25 DIAGNOSIS — Z79890 Hormone replacement therapy (postmenopausal): Secondary | ICD-10-CM | POA: Diagnosis not present

## 2024-06-25 DIAGNOSIS — Z79899 Other long term (current) drug therapy: Secondary | ICD-10-CM | POA: Insufficient documentation

## 2024-06-25 DIAGNOSIS — M542 Cervicalgia: Secondary | ICD-10-CM | POA: Diagnosis not present

## 2024-06-25 DIAGNOSIS — M47812 Spondylosis without myelopathy or radiculopathy, cervical region: Secondary | ICD-10-CM | POA: Diagnosis not present

## 2024-06-25 DIAGNOSIS — W19XXXA Unspecified fall, initial encounter: Secondary | ICD-10-CM | POA: Diagnosis not present

## 2024-06-25 DIAGNOSIS — S0990XA Unspecified injury of head, initial encounter: Secondary | ICD-10-CM | POA: Diagnosis not present

## 2024-06-25 DIAGNOSIS — W07XXXA Fall from chair, initial encounter: Secondary | ICD-10-CM | POA: Diagnosis not present

## 2024-06-25 DIAGNOSIS — R3915 Urgency of urination: Secondary | ICD-10-CM | POA: Diagnosis not present

## 2024-06-25 DIAGNOSIS — I2510 Atherosclerotic heart disease of native coronary artery without angina pectoris: Secondary | ICD-10-CM | POA: Insufficient documentation

## 2024-06-25 DIAGNOSIS — E039 Hypothyroidism, unspecified: Secondary | ICD-10-CM | POA: Insufficient documentation

## 2024-06-25 DIAGNOSIS — M4312 Spondylolisthesis, cervical region: Secondary | ICD-10-CM | POA: Diagnosis not present

## 2024-06-25 DIAGNOSIS — R519 Headache, unspecified: Secondary | ICD-10-CM | POA: Diagnosis not present

## 2024-06-25 DIAGNOSIS — Z7982 Long term (current) use of aspirin: Secondary | ICD-10-CM | POA: Insufficient documentation

## 2024-06-25 LAB — CBC WITH DIFFERENTIAL/PLATELET
Abs Immature Granulocytes: 0.02 K/uL (ref 0.00–0.07)
Basophils Absolute: 0.1 K/uL (ref 0.0–0.1)
Basophils Relative: 1 %
Eosinophils Absolute: 0.4 K/uL (ref 0.0–0.5)
Eosinophils Relative: 4 %
HCT: 43.8 % (ref 36.0–46.0)
Hemoglobin: 13.1 g/dL (ref 12.0–15.0)
Immature Granulocytes: 0 %
Lymphocytes Relative: 25 %
Lymphs Abs: 2 K/uL (ref 0.7–4.0)
MCH: 27.6 pg (ref 26.0–34.0)
MCHC: 29.9 g/dL — ABNORMAL LOW (ref 30.0–36.0)
MCV: 92.2 fL (ref 80.0–100.0)
Monocytes Absolute: 0.9 K/uL (ref 0.1–1.0)
Monocytes Relative: 12 %
Neutro Abs: 4.7 K/uL (ref 1.7–7.7)
Neutrophils Relative %: 58 %
Platelets: 253 K/uL (ref 150–400)
RBC: 4.75 MIL/uL (ref 3.87–5.11)
RDW: 13.1 % (ref 11.5–15.5)
WBC: 8.1 K/uL (ref 4.0–10.5)
nRBC: 0 % (ref 0.0–0.2)

## 2024-06-25 LAB — COMPREHENSIVE METABOLIC PANEL WITH GFR
ALT: 13 U/L (ref 0–44)
AST: 22 U/L (ref 15–41)
Albumin: 3.8 g/dL (ref 3.5–5.0)
Alkaline Phosphatase: 61 U/L (ref 38–126)
Anion gap: 11 (ref 5–15)
BUN: 9 mg/dL (ref 8–23)
CO2: 25 mmol/L (ref 22–32)
Calcium: 9.4 mg/dL (ref 8.9–10.3)
Chloride: 101 mmol/L (ref 98–111)
Creatinine, Ser: 0.66 mg/dL (ref 0.44–1.00)
GFR, Estimated: >60 mL/min (ref >60)
Glucose, Bld: 82 mg/dL (ref 70–99)
Potassium: 3.5 mmol/L (ref 3.5–5.1)
Sodium: 137 mmol/L (ref 135–145)
Total Bilirubin: 0.5 mg/dL (ref 0.0–1.2)
Total Protein: 6.3 g/dL — ABNORMAL LOW (ref 6.5–8.1)

## 2024-06-25 LAB — URINALYSIS, ROUTINE W REFLEX MICROSCOPIC
Bacteria, UA: NONE SEEN
Bilirubin Urine: NEGATIVE
Glucose, UA: NEGATIVE mg/dL
Hgb urine dipstick: NEGATIVE
Ketones, ur: NEGATIVE mg/dL
Nitrite: NEGATIVE
Protein, ur: NEGATIVE mg/dL
Specific Gravity, Urine: 1.014 (ref 1.005–1.030)
pH: 7 (ref 5.0–8.0)

## 2024-06-25 MED ORDER — CEPHALEXIN 500 MG PO CAPS
500.0000 mg | ORAL_CAPSULE | Freq: Once | ORAL | Status: AC
  Administered 2024-06-25: 500 mg via ORAL
  Filled 2024-06-25: qty 1

## 2024-06-25 MED ORDER — VANCOMYCIN HCL 125 MG PO CAPS: 125.0000 mg | ORAL_CAPSULE | Freq: Four times a day (QID) | ORAL | 0 refills | Status: AC

## 2024-06-25 MED ORDER — VANCOMYCIN HCL 125 MG PO CAPS
125.0000 mg | ORAL_CAPSULE | Freq: Once | ORAL | Status: AC
  Administered 2024-06-25: 125 mg via ORAL
  Filled 2024-06-25: qty 1

## 2024-06-25 MED ORDER — ONDANSETRON 4 MG PO TBDP
4.0000 mg | ORAL_TABLET | Freq: Once | ORAL | Status: AC
  Administered 2024-06-25: 4 mg via ORAL
  Filled 2024-06-25: qty 1

## 2024-06-25 MED ORDER — CEPHALEXIN 500 MG PO CAPS
500.0000 mg | ORAL_CAPSULE | Freq: Four times a day (QID) | ORAL | 0 refills | Status: DC
Start: 2024-06-25 — End: 2024-07-03

## 2024-06-25 NOTE — ED Provider Notes (Signed)
  EMERGENCY DEPARTMENT AT [facility] Provider Note   CSN: [id number] Arrival date & time: 06/25/24  1545     Patient presents with: Fall and Nausea   Courtney [name] is a 78 y.o. female.  Patient with past history significant for hypothyroidism, dyslipidemia, hypertension, and prior CVA with concerns of fall.  She reports that she was in the shower when she fell out of the bench that she was sitting on.  Reports that she slipped and denies any feelings of weakness, lightheadedness, chest pain, shortness of breath prior to the fall.  Endorses a mild headache at this time as well as some neck pain as well as some nausea but denies any vomiting.   Fall       Prior to Admission medications   Medication Sig Start Date End Date Taking? Authorizing Provider  cephALEXin  (KEFLEX ) 500 MG capsule Take 1 capsule (500 mg total) by mouth 4 (four) times daily. 06/25/24  Yes Loomis Anacker A, PA-C  vancomycin  (VANCOCIN ) 125 MG capsule Take 1 capsule (125 mg total) by mouth 4 (four) times daily for 5 days. 06/25/24 06/30/24 Yes Shakim Faith A, PA-C  albuterol  (VENTOLIN  HFA) 108 (90 Base) MCG/ACT inhaler Inhale 1 puff into the lungs every 6 (six) hours as needed for wheezing or shortness of breath. 02/22/21   [provider]  aspirin  325 MG tablet Take 325 mg by mouth daily.    [provider]  atorvastatin  (LIPITOR ) 80 MG tablet Take 80 mg by mouth every evening. 02/21/21   [provider]  Biotin 5000 MCG CAPS Take 5,000 mcg by mouth daily.    [provider]  buPROPion  (WELLBUTRIN  SR) 150 MG 12 hr tablet Take 150 mg by mouth daily.    [provider]  Calcium  Carb-Cholecalciferol  (CALCIUM  600/VITAMIN D  PO) Take 1 tablet by mouth daily.    [provider]  carboxymethylcellulose (REFRESH PLUS) 0.5 % SOLN Place 1 drop into both eyes daily as needed (dry eyes).    [provider]  Cholecalciferol  (VITAMIN D3) 25 MCG (1000 UT) CAPS  Take 1,000 Units by mouth daily.    [provider]  cyanocobalamin  (,VITAMIN B-12,) 1000 MCG/ML injection Inject 1,000 mcg into the muscle every 30 (thirty) days. 02/23/21   [provider]  diclofenac  Sodium (VOLTAREN ) 1 % GEL Apply 2 g topically 4 (four) times daily as needed (pain). 12/02/20   [provider]  docusate sodium  (COLACE) 100 MG capsule Take 100 mg by mouth daily.    [provider]  escitalopram  (LEXAPRO ) 20 MG tablet Take 20 mg by mouth daily. 11/24/20   [provider]  furosemide  (LASIX ) 40 MG tablet Take 40 mg by mouth daily as needed for fluid or edema. Patient not taking: Reported on 04/11/2024 12/11/22   [provider]  gabapentin  (NEURONTIN ) 100 MG capsule Take 100 mg by mouth 2 (two) times daily. Take 100mg  in the morning and 100mg  in the afternoon. To be taken in addition to 300mg  capsule nightly for a total daily dose of 500mg  02/13/21   [provider]  gabapentin  (NEURONTIN ) 300 MG capsule Take 300 mg by mouth at bedtime. To be taken in addition to 100mg  capsule in the morning and 100mg  capsule in the afternoon for a total daily dose of 500mg     [provider]  ipratropium (ATROVENT) 0.03 % nasal spray Place 1 spray into both nostrils daily. 04/01/23 04/11/25  [provider]  levothyroxine  (SYNTHROID ) 50  MCG tablet Take 50 mcg by mouth daily before breakfast. 02/09/21   [provider]  LORazepam  (ATIVAN ) 1 MG tablet Take 1 tablet (1 mg total) by mouth at bedtime. 01/03/22   Bryn Bernardino NOVAK, MD  meclizine  (ANTIVERT ) 25 MG tablet Take 25 mg by mouth every 8 (eight) hours as needed for dizziness.    [provider]  melatonin 5 MG TABS Take 5 mg by mouth at bedtime.    [provider]  montelukast  (SINGULAIR ) 10 MG tablet Take 10 mg by mouth at bedtime. 02/13/21   [provider]  multivitamin-lutein (OCUVITE-LUTEIN) CAPS capsule Take 1 capsule by mouth daily.     [provider]  olmesartan (BENICAR) 40 MG tablet Take 40 mg by mouth daily. 02/21/21   [provider]  pantoprazole  (PROTONIX ) 40 MG tablet Take 40 mg by mouth daily.    [provider]  PRESCRIPTION MEDICATION CPAP- At bedtime    [provider]  TRELEGY ELLIPTA 100-62.5-25 MCG/ACT AEPB Inhale 1 puff into the lungs daily.    [provider]  verapamil  (CALAN -SR) 120 MG CR tablet Take 360 mg by mouth daily. 02/13/21   [provider]  WEGOVY 0.25 MG/0.5ML SOAJ Inject 0.25 mg into the skin once a week. Patient not taking: Reported on 04/11/2024 01/08/24   [provider]    Allergies: Tetanus toxoid-containing vaccines, Tetanus-diphtheria toxoids td, Meloxicam, Phentermine, Sulfa antibiotics, and Zithromax [azithromycin]    Review of Systems  Gastrointestinal:  Positive for nausea.  All other systems reviewed and are negative.   Updated Vital Signs BP 123/68 (BP Location: Right Arm)   Pulse 63   Temp 98.8 F (37.1 C) (Oral)   Resp 15   Ht 5' 2 (1.575 m)   Wt 89.8 kg   SpO2 95%   BMI 36.21 kg/m   Physical Exam Vitals and nursing note reviewed.  Constitutional:      General: She is not in acute distress.    Appearance: She is well-developed.  HENT:     Head: Normocephalic and atraumatic.  Eyes:     Conjunctiva/sclera: Conjunctivae normal.  Cardiovascular:     Rate and Rhythm: Normal rate and regular rhythm.     Heart sounds: No murmur heard. Pulmonary:     Effort: Pulmonary effort is normal. No respiratory distress.     Breath sounds: Normal breath sounds.  Abdominal:     Palpations: Abdomen is soft.     Tenderness: There is no abdominal tenderness.  Musculoskeletal:        General: No swelling or tenderness. Normal range of motion.     Cervical back: Normal range of motion and neck supple. No rigidity.  Skin:    General: Skin is warm and dry.     Capillary Refill: Capillary refill takes less than 2  seconds.     Findings: Bruising present.     Comments: Bruising primarily to the right hip. Slight tenderness to palpation primarily to the posterior right hip but no evidence of instability.  Neurological:     Mental Status: She is alert.  Psychiatric:        Mood and Affect: Mood normal.     (all labs ordered are listed, but only abnormal results are displayed) Labs Reviewed  CBC WITH DIFFERENTIAL/PLATELET - Abnormal; Notable for the following components:      Result Value   MCHC 29.9 (*)    All other components within normal limits  COMPREHENSIVE METABOLIC PANEL WITH  GFR - Abnormal; Notable for the following components:   Total Protein 6.3 (*)    All other components within normal limits  URINALYSIS, ROUTINE W REFLEX MICROSCOPIC - Abnormal; Notable for the following components:   Leukocytes,Ua TRACE (*)    All other components within normal limits  URINE CULTURE    EKG: None  Radiology: CT Cervical Spine Wo Contrast Result Date: 06/25/2024 EXAM: CT CERVICAL SPINE WITHOUT CONTRAST 06/25/2024 06:31:09 PM TECHNIQUE: CT of the cervical spine was performed without the administration of intravenous contrast. Multiplanar reformatted images are provided for review. Automated exposure control, iterative reconstruction, and/or weight based adjustment of the mA/kV was utilized to reduce the radiation dose to as low as reasonably achievable. COMPARISON: CT cervical spine 5122 CLINICAL HISTORY: Neck trauma (Age >= 65y). Pt BIBA from [facility], c/o fall from chair in the shower. Did hit back of head on left side. Denies LOC, and blood thinners. Pt did start feeling nauseous and pain on left side neck on arrival. FINDINGS: CERVICAL SPINE: BONES AND ALIGNMENT: Interval anterior cervical discectomy and fusion spanning C3 to C6. Hardware is intact. Trace anterolisthesis of C6 on C7. There is no evidence of compression fracture or displaced fracture in the cervical spine. Irregularity of the T3 and T4  superior endplates with mild height loss which are each indeterminate. DEGENERATIVE CHANGES: Facet arthrosis and vertebral hypertrophy at multiple levels. No high-grade osseous spinal canal stenosis. SOFT TISSUES: 0.8 cm nodule in the posterior aspect of the left thyroid  lobe. IMPRESSION: 1. No acute abnormality of the cervical spine. 2. Irregularity of the T3 and T4 superior endplates with mild height loss, age indeterminate. Recommend correlation with tenderness at this level. 3. ACDF spanning C3 to C6 with intact hardware. Electronically signed by: Donnice Mania MD 06/25/2024 06:52 PM EDT RP Workstation: [id number]   CT Head Wo Contrast Result Date: 06/25/2024 EXAM: CT HEAD WITHOUT CONTRAST 06/25/2024 06:31:09 PM TECHNIQUE: CT of the head was performed without the administration of intravenous contrast. Automated exposure control, iterative reconstruction, and/or weight based adjustment of the mA/kV was utilized to reduce the radiation dose to as low as reasonably achievable. COMPARISON: CT head 02/12/2024 CLINICAL HISTORY: Head trauma, moderate-severe. Pt BIBA from [facility], c/o fall from chair in the shower. Did hit back of head on left side. Denies LOC, and blood thinners. Pt did start feeling nauseous and pain on left side neck on arrival. FINDINGS: BRAIN AND VENTRICLES: No acute hemorrhage. No evidence of acute infarct. No hydrocephalus. No extra-axial collection. No mass effect or midline shift. Nonspecific hypoattenuation in the periventricular and subcortical white matter, most likely representing chronic small vessel disease. Generalized parenchymal volume loss. Remote lacunar infarcts in the bilateral basal ganglia. ORBITS: Bilateral lens replacement. SINUSES: No acute abnormality. SOFT TISSUES AND SKULL: No acute soft tissue abnormality. No skull fracture. IMPRESSION: 1. No acute intracranial abnormality related to the reported head trauma. 2. Chronic small vessel disease and mild generalized  parenchymal volume loss. 3. Remote lacunar infarcts in the bilateral basal ganglia. Electronically signed by: Donnice Mania MD 06/25/2024 06:42 PM EDT RP Workstation: [id number]   DG Pelvis 1-2 Views Result Date: 06/25/2024 CLINICAL DATA:  Status post fall. EXAM: PELVIS - 1-2 VIEW COMPARISON:  None Available. FINDINGS: There is no evidence of pelvic fracture or diastasis. A radiopaque stimulator and associated stimulator wire are seen overlying the right ilium. No pelvic bone lesions are seen. Postoperative changes are noted within the visualized portion of the lower lumbar spine. IMPRESSION: No acute  osseous abnormality. Electronically Signed   By: Suzen Dials M.D.   On: 06/25/2024 18:30     Procedures   Medications Ordered in the ED  cephALEXin  (KEFLEX ) capsule 500 mg (has no administration in time range)  vancomycin  (VANCOCIN ) capsule 125 mg (has no administration in time range)  ondansetron  (ZOFRAN -ODT) disintegrating tablet 4 mg (4 mg Oral Given 06/25/24 1732)                                    Medical Decision Making Amount and/or Complexity of Data Reviewed Labs: ordered. Radiology: ordered.  Risk Prescription drug management.   This patient presents to the ED for concern of fall, nausea, increased urinary urgency, this involves an extensive number of treatment options, and is a complaint that carries with it a high risk of complications and morbidity.  The differential diagnosis includes rib fracture, head bleed, UTI, pyelonephritis, dehydration   Co morbidities that complicate the patient evaluation  C. difficile history, coronary artery disease, diverticulitis, right hemiparesis due to prior CVA   Lab Tests:  I Ordered, and personally interpreted labs.  The pertinent results include: CBC and CMP unremarkable, UA without clear signs of infection but there are some leukocytes present.   Imaging Studies ordered:  I ordered imaging studies including CT head, CT  cervical spine, x-ray of the pelvis I independently visualized and interpreted imaging which showed CT head and neck unremarkable, x-ray of the pelvis negative for any acute fractures and appears to show bladder stimulator intact. I agree with the radiologist interpretation   Consultations Obtained:  I requested consultation with none,  and discussed lab and imaging findings as well as pertinent plan - they recommend: N/A   Problem List / ED Course / Critical interventions / Medication management  Patient presents emergency department today with concerns of a fall.  States that she was in the shower when she slipped off the bench and hit her head against a bench.  Denies loss of consciousness.  Endorsing pain to the back of her head as well as some nausea and some neck pain.  She does state that she takes a daily aspirin  but not on any other blood thinner.  She does also endorse that she has had some increased urinary frequency over the last several days.  Denies any burning with urination or blood present.  She does report history of UTIs and states that does not feel exactly the same but is unsure where she has increased urgency. Physical exam was unremarkable with some point tenderness towards the midline cervical spine but no step-off deformities.  Range of motion intact.  No obvious lesions or lacerations. Patient's labs unremarkable with no obvious signs of infection or Hemoglobin.  CMP Unremarkable for Renal or Liver Abnormalities.  UA with Some Possible Signs of Infection with Trace Leukocytes but No Obvious Nitrites, or Bacteria Seen.  Will Send off for Urine Culture.  Given Patient's Symptoms, Concern for Possible UTI Will Start Patient on Course of Antibiotics.  She Does Endorse That She Has Had Issues with C. difficile Develop after Simple Antibiotic Courses and Has Been Advised by ID to Initiate Concurrent Vancomycin  Therapy.  Prescriptions for Keflex  and Vancomycin  Sent to Pharmacy.  She  Is Otherwise Stable This Time for Outpatient Follow-Up and Discharged Home. I ordered medication including Keflex , vancomycin  for UTI Reevaluation of the patient after these medicines showed that the patient improved  I have reviewed the patients home medicines and have made adjustments as needed   Social Determinants of Health:  None   Test / Admission - Considered:  Admission considered but patient stable for outpatient follow-up.  Final diagnoses:  Fall, initial encounter  Urinary urgency    ED Discharge Orders          Ordered    cephALEXin  (KEFLEX ) 500 MG capsule  4 times daily        06/25/24 1939    vancomycin  (VANCOCIN ) 125 MG capsule  4 times daily        06/25/24 1939               Shakiyah Cirilo A, PA-C 06/25/24 1946    Franklyn Sid SAILOR, MD 06/25/24 2025

## 2024-06-25 NOTE — Discharge Instructions (Signed)
 You were seen in the ER today for concerns of a fall and urinary urgency. Your labs and imaging were thankfully reassuring with no signs of head bleeding, neck injury, or obvious lab abnormalities. Your urine has some white blood cells present and with your present symptoms, we decided to initiate treatment but a urine culture will be sent off for further assessment. In the meantime, I am starting you on antibiotics for a suspected UTI. Please take these as prescribed. For concerns of worsening symptoms, please return to the ER or follow up with your primary care provider.

## 2024-06-25 NOTE — ED Triage Notes (Addendum)
 Pt BIBA from [facility], c/o fall from chair in the shower. Did hit back of head on left side. Denies LOC, and blood thinners. Pt did start feeling nauseous and pain on left side neck on arrival.   BP 138/80 HR 60 RR 16 O2 96RA CBG 108

## 2024-06-27 LAB — URINE CULTURE: Special Requests: NORMAL

## 2024-06-28 DIAGNOSIS — M6281 Muscle weakness (generalized): Secondary | ICD-10-CM | POA: Diagnosis not present

## 2024-06-28 DIAGNOSIS — R278 Other lack of coordination: Secondary | ICD-10-CM | POA: Diagnosis not present

## 2024-06-28 DIAGNOSIS — R2681 Unsteadiness on feet: Secondary | ICD-10-CM | POA: Diagnosis not present

## 2024-06-28 DIAGNOSIS — N179 Acute kidney failure, unspecified: Secondary | ICD-10-CM | POA: Diagnosis not present

## 2024-06-28 DIAGNOSIS — R2689 Other abnormalities of gait and mobility: Secondary | ICD-10-CM | POA: Diagnosis not present

## 2024-06-30 DIAGNOSIS — R278 Other lack of coordination: Secondary | ICD-10-CM | POA: Diagnosis not present

## 2024-06-30 DIAGNOSIS — R2681 Unsteadiness on feet: Secondary | ICD-10-CM | POA: Diagnosis not present

## 2024-06-30 DIAGNOSIS — R2689 Other abnormalities of gait and mobility: Secondary | ICD-10-CM | POA: Diagnosis not present

## 2024-06-30 DIAGNOSIS — M6281 Muscle weakness (generalized): Secondary | ICD-10-CM | POA: Diagnosis not present

## 2024-06-30 DIAGNOSIS — N179 Acute kidney failure, unspecified: Secondary | ICD-10-CM | POA: Diagnosis not present

## 2024-07-03 ENCOUNTER — Emergency Department (HOSPITAL_BASED_OUTPATIENT_CLINIC_OR_DEPARTMENT_OTHER)
Admission: EM | Admit: 2024-07-03 | Discharge: 2024-07-03 | Disposition: A | Discharge status: Home / Self Care | Attending: Emergency Medicine | Admitting: Emergency Medicine

## 2024-07-03 ENCOUNTER — Other Ambulatory Visit: Payer: Self-pay

## 2024-07-03 ENCOUNTER — Emergency Department (HOSPITAL_BASED_OUTPATIENT_CLINIC_OR_DEPARTMENT_OTHER)

## 2024-07-03 DIAGNOSIS — E039 Hypothyroidism, unspecified: Secondary | ICD-10-CM | POA: Diagnosis not present

## 2024-07-03 DIAGNOSIS — Z7982 Long term (current) use of aspirin: Secondary | ICD-10-CM | POA: Insufficient documentation

## 2024-07-03 DIAGNOSIS — I2510 Atherosclerotic heart disease of native coronary artery without angina pectoris: Secondary | ICD-10-CM | POA: Diagnosis not present

## 2024-07-03 DIAGNOSIS — D72829 Elevated white blood cell count, unspecified: Secondary | ICD-10-CM | POA: Diagnosis not present

## 2024-07-03 DIAGNOSIS — Z8673 Personal history of transient ischemic attack (TIA), and cerebral infarction without residual deficits: Secondary | ICD-10-CM | POA: Diagnosis not present

## 2024-07-03 DIAGNOSIS — N390 Urinary tract infection, site not specified: Secondary | ICD-10-CM | POA: Insufficient documentation

## 2024-07-03 DIAGNOSIS — I10 Essential (primary) hypertension: Secondary | ICD-10-CM | POA: Insufficient documentation

## 2024-07-03 DIAGNOSIS — N200 Calculus of kidney: Secondary | ICD-10-CM | POA: Diagnosis not present

## 2024-07-03 DIAGNOSIS — K5730 Diverticulosis of large intestine without perforation or abscess without bleeding: Secondary | ICD-10-CM | POA: Diagnosis not present

## 2024-07-03 DIAGNOSIS — Z79890 Hormone replacement therapy (postmenopausal): Secondary | ICD-10-CM | POA: Insufficient documentation

## 2024-07-03 DIAGNOSIS — E876 Hypokalemia: Secondary | ICD-10-CM | POA: Insufficient documentation

## 2024-07-03 DIAGNOSIS — Z79899 Other long term (current) drug therapy: Secondary | ICD-10-CM | POA: Diagnosis not present

## 2024-07-03 DIAGNOSIS — R109 Unspecified abdominal pain: Secondary | ICD-10-CM | POA: Diagnosis not present

## 2024-07-03 DIAGNOSIS — R319 Hematuria, unspecified: Secondary | ICD-10-CM | POA: Diagnosis not present

## 2024-07-03 LAB — BASIC METABOLIC PANEL WITH GFR
Anion gap: 13 (ref 5–15)
BUN: 8 mg/dL (ref 8–23)
CO2: 27 mmol/L (ref 22–32)
Calcium: 10.2 mg/dL (ref 8.9–10.3)
Chloride: 100 mmol/L (ref 98–111)
Creatinine, Ser: 0.8 mg/dL (ref 0.44–1.00)
GFR, Estimated: >60 mL/min (ref >60)
Glucose, Bld: 86 mg/dL (ref 70–99)
Potassium: 3.4 mmol/L — ABNORMAL LOW (ref 3.5–5.1)
Sodium: 139 mmol/L (ref 135–145)

## 2024-07-03 LAB — URINALYSIS, ROUTINE W REFLEX MICROSCOPIC
Bacteria, UA: NONE SEEN
Bilirubin Urine: NEGATIVE
Glucose, UA: NEGATIVE mg/dL
Nitrite: NEGATIVE
Protein, ur: >300 mg/dL — AB
RBC / HPF: >50 RBC/hpf (ref 0–5)
Specific Gravity, Urine: 1.033 — ABNORMAL HIGH (ref 1.005–1.030)
WBC, UA: >50 WBC/hpf (ref 0–5)
pH: 6 (ref 5.0–8.0)

## 2024-07-03 LAB — CBC
HCT: 41.4 % (ref 36.0–46.0)
Hemoglobin: 13.3 g/dL (ref 12.0–15.0)
MCH: 29.2 pg (ref 26.0–34.0)
MCHC: 32.1 g/dL (ref 30.0–36.0)
MCV: 90.8 fL (ref 80.0–100.0)
Platelets: 236 K/uL (ref 150–400)
RBC: 4.56 MIL/uL (ref 3.87–5.11)
RDW: 13.6 % (ref 11.5–15.5)
WBC: 12.3 K/uL — ABNORMAL HIGH (ref 4.0–10.5)
nRBC: 0 % (ref 0.0–0.2)

## 2024-07-03 MED ORDER — SODIUM CHLORIDE 0.9 % IV SOLN
1.0000 g | Freq: Once | INTRAVENOUS | Status: AC
  Administered 2024-07-03: 1 g via INTRAVENOUS
  Filled 2024-07-03: qty 10

## 2024-07-03 MED ORDER — IOHEXOL 300 MG/ML  SOLN
80.0000 mL | Freq: Once | INTRAMUSCULAR | Status: AC | PRN
  Administered 2024-07-03: 80 mL via INTRAVENOUS

## 2024-07-03 MED ORDER — VANCOMYCIN HCL 125 MG PO CAPS: 125.0000 mg | ORAL_CAPSULE | Freq: Four times a day (QID) | ORAL | 0 refills | Status: DC

## 2024-07-03 MED ORDER — SODIUM CHLORIDE 0.9 % IV BOLUS
500.0000 mL | Freq: Once | INTRAVENOUS | Status: AC
  Administered 2024-07-03: 500 mL via INTRAVENOUS

## 2024-07-03 MED ORDER — CEFADROXIL 500 MG PO CAPS: 500.0000 mg | ORAL_CAPSULE | Freq: Two times a day (BID) | ORAL | 0 refills | Status: DC

## 2024-07-03 NOTE — ED Provider Notes (Signed)
 [place] [facility]ERGENCY DEPARTMENT AT [facility] Provider Note   CSN: [id number] Arrival date & time: 07/03/24  1630     Patient presents with: Hematuria   Courtney [name] is a 78 y.o. female.    Hematuria   78 year old female presents emergency department with complaints of hematuria, dysuria.  States that she recently had a fall 9/5 and was diagnosed with a urinary tract infection at that time.  Was placed on Keflex  which she took to his completion 5-day course.  States that today, began with urinary symptoms again.  Denies any fever, abdominal pain, flank pain.  Concerned that she may have another UTI.  Past medical history significant for CVA, PE, hiatal hernia, mitral valve prolapse, diverticulitis, hypertension, bipolar disorder, hypothyroidism, OSA, CAD, depression Prior to Admission medications   Medication Sig Start Date End Date Taking? Authorizing Provider  albuterol  (VENTOLIN  HFA) 108 (90 Base) MCG/ACT inhaler Inhale 1 puff into the lungs every 6 (six) hours as needed for wheezing or shortness of breath. 02/22/21   [provider]  aspirin  325 MG tablet Take 325 mg by mouth daily.    [provider]  atorvastatin  (LIPITOR ) 80 MG tablet Take 80 mg by mouth every evening. 02/21/21   [provider]  Biotin 5000 MCG CAPS Take 5,000 mcg by mouth daily.    [provider]  buPROPion  (WELLBUTRIN  SR) 150 MG 12 hr tablet Take 150 mg by mouth daily.    [provider]  Calcium  Carb-Cholecalciferol  (CALCIUM  600/VITAMIN D  PO) Take 1 tablet by mouth daily.    [provider]  carboxymethylcellulose (REFRESH PLUS) 0.5 % SOLN Place 1 drop into both eyes daily as needed (dry eyes).    [provider]  cephALEXin  (KEFLEX ) 500 MG capsule Take 1 capsule (500 mg total) by mouth 4 (four) times daily. 06/25/24   Zelaya, Oscar A, PA-C  Cholecalciferol  (VITAMIN D3) 25 MCG (1000 UT) CAPS Take 1,000 Units by mouth daily.    [provider]  cyanocobalamin  (,VITAMIN B-12,) 1000 MCG/ML injection Inject 1,000 mcg into the muscle every 30 (thirty) days. 02/23/21   [provider]  diclofenac  Sodium (VOLTAREN ) 1 % GEL Apply 2 g topically 4 (four) times daily as needed (pain). 12/02/20   [provider]  docusate sodium  (COLACE) 100 MG capsule Take 100 mg by mouth daily.    [provider]  escitalopram  (LEXAPRO ) 20 MG tablet Take 20 mg by mouth daily. 11/24/20   [provider]  furosemide  (LASIX ) 40 MG tablet Take 40 mg by mouth daily as needed for fluid or edema. Patient not taking: Reported on 04/11/2024 12/11/22   [provider]  gabapentin  (NEURONTIN ) 100 MG capsule Take 100 mg by mouth 2 (two) times daily. Take 100mg  in the morning and 100mg  in the afternoon. To be taken in addition to 300mg  capsule nightly for a total daily dose of 500mg  02/13/21   [provider]  gabapentin  (NEURONTIN ) 300 MG capsule Take 300 mg by mouth at bedtime. To be taken in addition to 100mg  capsule in the morning and 100mg  capsule in the afternoon for a total daily dose of 500mg     [provider]  ipratropium (ATROVENT) 0.03 % nasal spray Place 1 spray into both nostrils daily. 04/01/23 04/11/25  [provider]  levothyroxine  (SYNTHROID ) 50 MCG tablet Take 50 mcg by mouth daily before breakfast. 02/09/21   [provider]  LORazepam  (ATIVAN ) 1 MG tablet Take 1 tablet (1 mg  total) by mouth at bedtime. 01/03/22   Bryn Bernardino NOVAK, MD  meclizine  (ANTIVERT ) 25 MG tablet Take 25 mg by mouth every 8 (eight) hours as needed for dizziness.    [provider]  melatonin 5 MG TABS Take 5 mg by mouth at bedtime.    [provider]  montelukast  (SINGULAIR ) 10 MG tablet Take 10 mg by mouth at bedtime. 02/13/21   [provider]  multivitamin-lutein (OCUVITE-LUTEIN) CAPS capsule Take 1 capsule by mouth daily.    [provider]  olmesartan (BENICAR) 40  MG tablet Take 40 mg by mouth daily. 02/21/21   [provider]  pantoprazole  (PROTONIX ) 40 MG tablet Take 40 mg by mouth daily.    [provider]  PRESCRIPTION MEDICATION CPAP- At bedtime    [provider]  TRELEGY ELLIPTA 100-62.5-25 MCG/ACT AEPB Inhale 1 puff into the lungs daily.    [provider]  verapamil  (CALAN -SR) 120 MG CR tablet Take 360 mg by mouth daily. 02/13/21   [provider]  WEGOVY 0.25 MG/0.5ML SOAJ Inject 0.25 mg into the skin once a week. Patient not taking: Reported on 04/11/2024 01/08/24   [provider]    Allergies: Tetanus toxoid-containing vaccines, Tetanus-diphtheria toxoids td, Meloxicam, Phentermine, Sulfa antibiotics, and Zithromax [azithromycin]    Review of Systems  Genitourinary:  Positive for hematuria.  All other systems reviewed and are negative.   Updated Vital Signs BP (!) 128/92   Pulse 73   Temp 98.3 F (36.8 C) (Temporal)   Resp 18   Ht 5' 2 (1.575 m)   Wt 87.5 kg   SpO2 96%   BMI 35.30 kg/m   Physical Exam Vitals and nursing note reviewed.  Constitutional:      General: She is not in acute distress.    Appearance: She is well-developed.  HENT:     Head: Normocephalic and atraumatic.  Eyes:     Conjunctiva/sclera: Conjunctivae normal.  Cardiovascular:     Rate and Rhythm: Normal rate and regular rhythm.  Pulmonary:     Effort: Pulmonary effort is normal. No respiratory distress.     Breath sounds: Normal breath sounds.  Abdominal:     Palpations: Abdomen is soft.     Tenderness: There is no abdominal tenderness.  Musculoskeletal:        General: No swelling.     Cervical back: Neck supple.  Skin:    General: Skin is warm and dry.     Capillary Refill: Capillary refill takes less than 2 seconds.  Neurological:     Mental Status: She is alert.  Psychiatric:        Mood and Affect: Mood normal.     (all labs ordered are listed, but only abnormal results are  displayed) Labs Reviewed  URINALYSIS, ROUTINE W REFLEX MICROSCOPIC - Abnormal; Notable for the following components:      Result Value   APPearance CLOUDY (*)    Specific Gravity, Urine 1.033 (*)    Hgb urine dipstick LARGE (*)    Ketones, ur TRACE (*)    Protein, ur >300 (*)    Leukocytes,Ua LARGE (*)    All other components within normal limits  URINE CULTURE  CBC  BASIC METABOLIC PANEL WITH GFR    EKG: None  Radiology: No results found.   Procedures   Medications Ordered in the ED  cefTRIAXone  (ROCEPHIN ) 1 g in sodium chloride  0.9 % 100 mL IVPB (has no administration in time range)  Medical Decision Making Amount and/or Complexity of Data Reviewed Labs: ordered. Radiology: ordered.  Risk Prescription drug management.   This patient presents to the ED for concern of urinary symptoms, this involves an extensive number of treatment options, and is a complaint that carries with it a high risk of complications and morbidity.  The differential diagnosis includes cystitis, pyelonephritis, nephrolithiasis, malignancy, interstitial cystitis, other   Co morbidities that complicate the patient evaluation  See HPI   Additional history obtained:  Additional history obtained from EMR External records from outside source obtained and reviewed including hospital records   Lab Tests:  I Ordered, and personally interpreted labs.  The pertinent results include: UA large leukocytes, greater than 50 RBCs, greater than 50 WBCs, no bacteria seen but with WBC clumps and mucus present.  UA also with trace ketones, greater than 300 proteins, large hemoglobin.  Mild hypokalemia 3.4 otherwise, lites shows within normal limits.  Leukocytosis to 12.3.  No evidence of anemia.  Platelets within range.  Urine culture pending.   Imaging Studies ordered:  I ordered imaging studies including CT abdomen pelvis I independently visualized and interpreted  imaging which showed no acute abnormality.  Stone in right kidney. I agree with the radiologist interpretation   Cardiac Monitoring: / EKG:  The patient was maintained on a cardiac monitor.  I personally viewed and interpreted the cardiac monitored which showed an underlying rhythm of: This rhythm   Consultations Obtained:  See ED course  Problem List / ED Course / Critical interventions / Medication management  UTI I ordered medication including 500 cc normal saline, Rocephin    Reevaluation of the patient after these medicines showed that the patient stayed the same I have reviewed the patients home medicines and have made adjustments as needed   Social Determinants of Health:  Denies tobacco, licit drug use.   Test / Admission - Considered:  UTI Vitals signs within normal range and stable throughout visit. Laboratory/imaging studies significant for: See above 78 year old female presents emergency department with complaints of hematuria, dysuria.  States that she recently had a fall 9/5 and was diagnosed with a urinary tract infection at that time.  Was placed on Keflex  which she took to his completion 5-day course.  States that today, began with urinary symptoms again.  Denies any fever, abdominal pain, flank pain.  Concerned that she may have another UTI. On exam, no abdominal or CVA tenderness.  Labs concerning for UA for infection although with no bacteria seen with positive WBC, RBC, leukocyte, WBC clumps.  Will culture urine and treat empirically.  Leukocytosis of 12.3.  Otherwise, no emergent abnormalities on laboratory studies.  CT imaging was obtained which did not show any acute abnormality.  Suspect the patient's symptoms likely secondary to UTI.  Will culture urine and empirically placed on antibiotics.  Prior urine culture inconclusive with multiple species present.  Near pansensitive prior urine cultures before the most recent 1.  Treatment plan discussed with patient  she is understanding was agreeable.  Patient well-appearing, afebrile in no acute distress upon discharge. Worrisome signs and symptoms were discussed with the patient, and the patient acknowledged understanding to return to the ED if noticed. Patient was stable upon discharge.       Final diagnoses:  None    ED Discharge Orders     None          Silver Wonda LABOR, [place] 07/03/24 2145    Yolande Lamar BROCKS, MD 07/04/24 [phone]

## 2024-07-03 NOTE — ED Notes (Signed)
 Pt had urinated on bed, bed sheet and blankets were changed

## 2024-07-03 NOTE — ED Notes (Signed)
 ED Provider at bedside.

## 2024-07-03 NOTE — ED Triage Notes (Signed)
 Pt POV reporting hematuria that began today, seen in ED 9/5 r/t fall, dx UTI and completed abx, reporting persistent dysuria, afebrile.

## 2024-07-03 NOTE — ED Notes (Signed)
 Reviewed discharge instructions, medications, and home care with pt. Pt verbalized understanding and had no further questions. Pt exited ED without complications.

## 2024-07-03 NOTE — ED Notes (Signed)
 Patient transported to CT

## 2024-07-03 NOTE — ED Notes (Signed)
 Spoke with lab to add on urine culture.

## 2024-07-03 NOTE — Discharge Instructions (Addendum)
 As discussed, CT imaging did not show anything acutely abnormal.  You do have a stone in the right kidney but not causing obstruction at this time.  Urine still appears infected.  Will place you on a longer course of antibiotics for this as well as will culture your urine to see if it grows out anything.  Will recommend follow-up with your urologist/primary care for reassessment.

## 2024-07-04 LAB — URINE CULTURE: Culture: <10000 — AB

## 2024-07-05 DIAGNOSIS — N390 Urinary tract infection, site not specified: Secondary | ICD-10-CM | POA: Diagnosis not present

## 2024-07-12 DIAGNOSIS — R2681 Unsteadiness on feet: Secondary | ICD-10-CM | POA: Diagnosis not present

## 2024-07-12 DIAGNOSIS — R2689 Other abnormalities of gait and mobility: Secondary | ICD-10-CM | POA: Diagnosis not present

## 2024-07-12 DIAGNOSIS — R278 Other lack of coordination: Secondary | ICD-10-CM | POA: Diagnosis not present

## 2024-07-12 DIAGNOSIS — M6281 Muscle weakness (generalized): Secondary | ICD-10-CM | POA: Diagnosis not present

## 2024-07-12 DIAGNOSIS — N179 Acute kidney failure, unspecified: Secondary | ICD-10-CM | POA: Diagnosis not present

## 2024-07-13 DIAGNOSIS — R2689 Other abnormalities of gait and mobility: Secondary | ICD-10-CM | POA: Diagnosis not present

## 2024-07-13 DIAGNOSIS — M6281 Muscle weakness (generalized): Secondary | ICD-10-CM | POA: Diagnosis not present

## 2024-07-13 DIAGNOSIS — R278 Other lack of coordination: Secondary | ICD-10-CM | POA: Diagnosis not present

## 2024-07-13 DIAGNOSIS — N179 Acute kidney failure, unspecified: Secondary | ICD-10-CM | POA: Diagnosis not present

## 2024-07-13 DIAGNOSIS — R2681 Unsteadiness on feet: Secondary | ICD-10-CM | POA: Diagnosis not present

## 2024-07-14 DIAGNOSIS — D72829 Elevated white blood cell count, unspecified: Secondary | ICD-10-CM | POA: Diagnosis not present

## 2024-07-14 DIAGNOSIS — Z23 Encounter for immunization: Secondary | ICD-10-CM | POA: Diagnosis not present

## 2024-07-14 DIAGNOSIS — N390 Urinary tract infection, site not specified: Secondary | ICD-10-CM | POA: Diagnosis not present

## 2024-07-15 DIAGNOSIS — R278 Other lack of coordination: Secondary | ICD-10-CM | POA: Diagnosis not present

## 2024-07-15 DIAGNOSIS — R2689 Other abnormalities of gait and mobility: Secondary | ICD-10-CM | POA: Diagnosis not present

## 2024-07-15 DIAGNOSIS — N179 Acute kidney failure, unspecified: Secondary | ICD-10-CM | POA: Diagnosis not present

## 2024-07-15 DIAGNOSIS — M6281 Muscle weakness (generalized): Secondary | ICD-10-CM | POA: Diagnosis not present

## 2024-07-15 DIAGNOSIS — R2681 Unsteadiness on feet: Secondary | ICD-10-CM | POA: Diagnosis not present

## 2024-07-16 DIAGNOSIS — R2689 Other abnormalities of gait and mobility: Secondary | ICD-10-CM | POA: Diagnosis not present

## 2024-07-16 DIAGNOSIS — R278 Other lack of coordination: Secondary | ICD-10-CM | POA: Diagnosis not present

## 2024-07-16 DIAGNOSIS — R2681 Unsteadiness on feet: Secondary | ICD-10-CM | POA: Diagnosis not present

## 2024-07-16 DIAGNOSIS — M6281 Muscle weakness (generalized): Secondary | ICD-10-CM | POA: Diagnosis not present

## 2024-07-16 DIAGNOSIS — N179 Acute kidney failure, unspecified: Secondary | ICD-10-CM | POA: Diagnosis not present

## 2024-07-19 DIAGNOSIS — N3000 Acute cystitis without hematuria: Secondary | ICD-10-CM | POA: Diagnosis not present

## 2024-07-20 DIAGNOSIS — I639 Cerebral infarction, unspecified: Secondary | ICD-10-CM | POA: Diagnosis not present

## 2024-07-20 DIAGNOSIS — E039 Hypothyroidism, unspecified: Secondary | ICD-10-CM | POA: Diagnosis not present

## 2024-07-20 DIAGNOSIS — I5032 Chronic diastolic (congestive) heart failure: Secondary | ICD-10-CM | POA: Diagnosis not present

## 2024-07-20 DIAGNOSIS — J45909 Unspecified asthma, uncomplicated: Secondary | ICD-10-CM | POA: Diagnosis not present

## 2024-07-23 DIAGNOSIS — R3915 Urgency of urination: Secondary | ICD-10-CM | POA: Diagnosis not present

## 2024-07-23 DIAGNOSIS — R350 Frequency of micturition: Secondary | ICD-10-CM | POA: Diagnosis not present

## 2024-07-23 DIAGNOSIS — N3281 Overactive bladder: Secondary | ICD-10-CM | POA: Diagnosis not present

## 2024-07-23 DIAGNOSIS — N390 Urinary tract infection, site not specified: Secondary | ICD-10-CM | POA: Diagnosis not present

## 2024-07-26 DIAGNOSIS — N390 Urinary tract infection, site not specified: Secondary | ICD-10-CM | POA: Diagnosis not present

## 2024-08-05 DIAGNOSIS — N390 Urinary tract infection, site not specified: Secondary | ICD-10-CM | POA: Diagnosis not present

## 2024-08-12 DIAGNOSIS — N390 Urinary tract infection, site not specified: Secondary | ICD-10-CM | POA: Diagnosis not present

## 2024-08-12 DIAGNOSIS — R300 Dysuria: Secondary | ICD-10-CM | POA: Diagnosis not present

## 2024-10-06 ENCOUNTER — Ambulatory Visit: Admitting: Cardiology

## 2024-10-06 ENCOUNTER — Encounter: Payer: Self-pay | Admitting: Cardiology

## 2024-10-06 VITALS — BP 114/84 | HR 78 | Ht 62.0 in | Wt 195.0 lb

## 2024-10-06 DIAGNOSIS — R600 Localized edema: Secondary | ICD-10-CM | POA: Insufficient documentation

## 2024-10-06 DIAGNOSIS — I10 Essential (primary) hypertension: Secondary | ICD-10-CM | POA: Diagnosis not present

## 2024-10-06 DIAGNOSIS — Z8673 Personal history of transient ischemic attack (TIA), and cerebral infarction without residual deficits: Secondary | ICD-10-CM | POA: Diagnosis not present

## 2024-10-06 NOTE — Progress Notes (Signed)
 Cardiology Office Note:  .   Date:  10/06/2024  ID:  Madeline FORBES Matter, DOB July 04, 1946, MRN [id number] PCP: Ransom Other, MD  [facility] Providers Cardiologist:  Newman Lawrence, MD PCP: Ransom Other, MD  Chief Complaint  Patient presents with   Fatigue   Leg Swelling     ICELA GLYMPH is a 78 y.o. female with hypertension, h/o stroke, leg edema, asthma, OSA, fatigue  Discussed the use of AI scribe software for clinical note transcription with the patient, who gave verbal consent to proceed.  History of Present Illness Patient is here today with her husband.  She lives in [place].  She is not very active physically.  She had a stroke in 2019 with mild residual weakness, as well as frequent falls. She lives fairly sedentary lifestyle.  Denies any chest pain, shortness of breath, has noticed some leg swelling.  Recently, she was noted to have occasional irregular beat but her PCP.    Vitals:   10/06/24 1507  BP: 114/84  Pulse: 78  SpO2: 95%      Review of Systems  Cardiovascular:  Positive for leg swelling. Negative for chest pain, dyspnea on exertion, palpitations and syncope.        Studies Reviewed: SABRA        EKG 10/06/2024: Sinus rhythm with Premature supraventricular complexes Possible Left atrial enlargement Nonspecific T wave abnormality When compared with ECG of 11-Apr-2024 17:46,  lead reversal corrected    Echocardiogram 2023:  1. Left ventricular ejection fraction, by estimation, is 60 to 65%. The  left ventricle has normal function. The left ventricle has no regional  wall motion abnormalities. There is moderate asymmetric left ventricular  hypertrophy of the basal-septal  segment. Left ventricular diastolic parameters are consistent with Grade I  diastolic dysfunction (impaired relaxation).   2. Right ventricular systolic function is normal. The right ventricular  size is normal. There is normal  pulmonary artery systolic pressure. The  estimated right ventricular systolic pressure is 24.3 mmHg.   3. The mitral valve is normal in structure. Trivial mitral valve  regurgitation.   4. The aortic valve is tricuspid. Aortic valve regurgitation is not  visualized. Aortic valve sclerosis/calcification is present, without any  evidence of aortic stenosis.   Labs 6-09/2024: Chol 122, TG 106, HDL 58, LDL 44 Hb 13.3 Cr 0.8, K 3.4 HbA1C 5.8% TSH 0.9  Physical Exam Vitals and nursing note reviewed.  Constitutional:      General: She is not in acute distress. Neck:     Vascular: No JVD.  Cardiovascular:     Rate and Rhythm: Normal rate and regular rhythm.     Heart sounds: Normal heart sounds. No murmur heard. Pulmonary:     Effort: Pulmonary effort is normal.     Breath sounds: Normal breath sounds. No wheezing or rales.  Musculoskeletal:     Right lower leg: Edema (1+) present.     Left lower leg: Edema (1+) present.      VISIT DIAGNOSES:   ICD-10-CM   1. Primary hypertension  I10 EKG 12-Lead    ECHOCARDIOGRAM COMPLETE    2. H/O: stroke  Z86.73 EKG 12-Lead    3. Leg edema  R60.0 ECHOCARDIOGRAM COMPLETE       JAMISON YUHASZ is a 78 y.o. female with hypertension, h/o stroke, leg edema, asthma, OSA, fatigue Assessment & Plan Primary hypertension Previously on antihypertensive agents, currently controlled with low-dose verapamil .  Verapamil  is a good  choice given her occasional PVC noted on EKG as well.  Leg edema: Patient takes Lasix  at night, has not taken it today yet. Check echocardiogram, previously reassuring.  Unless echocardiogram shows any remarkable abnormalities, reasonable to continue Lasix  but increase taking it in the morning.    Fatigue: Likely multifactorial with OSA, deconditioning.  H/o stroke: Continue aspirin , statin.      F/u as needed  Signed, Newman JINNY Lawrence, MD

## 2024-10-06 NOTE — Patient Instructions (Signed)
 Medication Instructions:  None *If you need a refill on your cardiac medications before your next appointment, please call your pharmacy*  Lab Work: None If you have labs (blood work) drawn today and your tests are completely normal, you will receive your results only by: MyChart Message (if you have MyChart) OR A paper copy in the mail If you have any lab test that is abnormal or we need to change your treatment, we will call you to review the results.  Testing/Procedures: Your physician has requested that you have an echocardiogram. Echocardiography is a painless test that uses sound waves to create images of your heart. It provides your doctor with information about the size and shape of your heart and how well your hearts chambers and valves are working. This procedure takes approximately one hour. There are no restrictions for this procedure. Please do NOT wear cologne, perfume, aftershave, or lotions (deodorant is allowed). Please arrive 15 minutes prior to your appointment time.  Please note: We ask at that you not bring children with you during ultrasound (echo/ vascular) testing. Due to room size and safety concerns, children are not allowed in the ultrasound rooms during exams. Our front office staff cannot provide observation of children in our lobby area while testing is being conducted. An adult accompanying a patient to their appointment will only be allowed in the ultrasound room at the discretion of the ultrasound technician under special circumstances. We apologize for any inconvenience.   Follow-Up: At [facility], you and your health needs are our priority.  As part of our continuing mission to provide you with exceptional heart care, our providers are all part of one team.  This team includes your primary Cardiologist (physician) and Advanced Practice Providers or APPs (Physician Assistants and Nurse Practitioners) who all work together to provide you with the care  you need, when you need it.  Your next appointment:   As needed  Provider:    Dr. Elmira

## 2024-10-17 ENCOUNTER — Inpatient Hospital Stay (HOSPITAL_COMMUNITY)
Admission: EM | Admit: 2024-10-17 | Discharge: 2024-10-26 | DRG: 871 | Disposition: A | Discharge status: Skilled Nursing Facility | Source: Skilled Nursing Facility | Attending: Internal Medicine | Admitting: Internal Medicine

## 2024-10-17 ENCOUNTER — Emergency Department (HOSPITAL_COMMUNITY)

## 2024-10-17 ENCOUNTER — Other Ambulatory Visit: Payer: Self-pay

## 2024-10-17 ENCOUNTER — Encounter (HOSPITAL_COMMUNITY): Payer: Self-pay | Admitting: Emergency Medicine

## 2024-10-17 DIAGNOSIS — I5033 Acute on chronic diastolic (congestive) heart failure: Secondary | ICD-10-CM | POA: Diagnosis not present

## 2024-10-17 DIAGNOSIS — Z96653 Presence of artificial knee joint, bilateral: Secondary | ICD-10-CM | POA: Diagnosis present

## 2024-10-17 DIAGNOSIS — Z87442 Personal history of urinary calculi: Secondary | ICD-10-CM

## 2024-10-17 DIAGNOSIS — M19039 Primary osteoarthritis, unspecified wrist: Secondary | ICD-10-CM | POA: Diagnosis present

## 2024-10-17 DIAGNOSIS — A419 Sepsis, unspecified organism: Secondary | ICD-10-CM | POA: Diagnosis present

## 2024-10-17 DIAGNOSIS — B965 Pseudomonas (aeruginosa) (mallei) (pseudomallei) as the cause of diseases classified elsewhere: Secondary | ICD-10-CM | POA: Diagnosis present

## 2024-10-17 DIAGNOSIS — F319 Bipolar disorder, unspecified: Secondary | ICD-10-CM | POA: Diagnosis present

## 2024-10-17 DIAGNOSIS — Y92129 Unspecified place in nursing home as the place of occurrence of the external cause: Secondary | ICD-10-CM | POA: Diagnosis not present

## 2024-10-17 DIAGNOSIS — T84418A Breakdown (mechanical) of other internal orthopedic devices, implants and grafts, initial encounter: Secondary | ICD-10-CM | POA: Diagnosis present

## 2024-10-17 DIAGNOSIS — E785 Hyperlipidemia, unspecified: Secondary | ICD-10-CM | POA: Diagnosis present

## 2024-10-17 DIAGNOSIS — I69351 Hemiplegia and hemiparesis following cerebral infarction affecting right dominant side: Secondary | ICD-10-CM

## 2024-10-17 DIAGNOSIS — K529 Noninfective gastroenteritis and colitis, unspecified: Principal | ICD-10-CM | POA: Diagnosis present

## 2024-10-17 DIAGNOSIS — T84213A Breakdown (mechanical) of internal fixation device of bones of foot and toes, initial encounter: Secondary | ICD-10-CM | POA: Diagnosis present

## 2024-10-17 DIAGNOSIS — Z86711 Personal history of pulmonary embolism: Secondary | ICD-10-CM | POA: Diagnosis not present

## 2024-10-17 DIAGNOSIS — Z886 Allergy status to analgesic agent status: Secondary | ICD-10-CM

## 2024-10-17 DIAGNOSIS — I2583 Coronary atherosclerosis due to lipid rich plaque: Secondary | ICD-10-CM | POA: Diagnosis not present

## 2024-10-17 DIAGNOSIS — J45909 Unspecified asthma, uncomplicated: Secondary | ICD-10-CM | POA: Diagnosis present

## 2024-10-17 DIAGNOSIS — Z83438 Family history of other disorder of lipoprotein metabolism and other lipidemia: Secondary | ICD-10-CM

## 2024-10-17 DIAGNOSIS — Z7982 Long term (current) use of aspirin: Secondary | ICD-10-CM

## 2024-10-17 DIAGNOSIS — Z79890 Hormone replacement therapy (postmenopausal): Secondary | ICD-10-CM | POA: Diagnosis not present

## 2024-10-17 DIAGNOSIS — W19XXXA Unspecified fall, initial encounter: Secondary | ICD-10-CM | POA: Diagnosis present

## 2024-10-17 DIAGNOSIS — Z7951 Long term (current) use of inhaled steroids: Secondary | ICD-10-CM

## 2024-10-17 DIAGNOSIS — E876 Hypokalemia: Secondary | ICD-10-CM | POA: Diagnosis present

## 2024-10-17 DIAGNOSIS — Z1152 Encounter for screening for COVID-19: Secondary | ICD-10-CM

## 2024-10-17 DIAGNOSIS — R32 Unspecified urinary incontinence: Secondary | ICD-10-CM | POA: Diagnosis present

## 2024-10-17 DIAGNOSIS — I2510 Atherosclerotic heart disease of native coronary artery without angina pectoris: Secondary | ICD-10-CM | POA: Diagnosis present

## 2024-10-17 DIAGNOSIS — Z9049 Acquired absence of other specified parts of digestive tract: Secondary | ICD-10-CM

## 2024-10-17 DIAGNOSIS — Z823 Family history of stroke: Secondary | ICD-10-CM

## 2024-10-17 DIAGNOSIS — Z803 Family history of malignant neoplasm of breast: Secondary | ICD-10-CM

## 2024-10-17 DIAGNOSIS — Z79899 Other long term (current) drug therapy: Secondary | ICD-10-CM | POA: Diagnosis not present

## 2024-10-17 DIAGNOSIS — A041 Enterotoxigenic Escherichia coli infection: Secondary | ICD-10-CM | POA: Diagnosis present

## 2024-10-17 DIAGNOSIS — Z882 Allergy status to sulfonamides status: Secondary | ICD-10-CM

## 2024-10-17 DIAGNOSIS — N390 Urinary tract infection, site not specified: Secondary | ICD-10-CM | POA: Diagnosis present

## 2024-10-17 DIAGNOSIS — I10 Essential (primary) hypertension: Secondary | ICD-10-CM | POA: Diagnosis not present

## 2024-10-17 DIAGNOSIS — Z981 Arthrodesis status: Secondary | ICD-10-CM

## 2024-10-17 DIAGNOSIS — E039 Hypothyroidism, unspecified: Secondary | ICD-10-CM | POA: Diagnosis present

## 2024-10-17 DIAGNOSIS — Z8619 Personal history of other infectious and parasitic diseases: Secondary | ICD-10-CM

## 2024-10-17 DIAGNOSIS — I341 Nonrheumatic mitral (valve) prolapse: Secondary | ICD-10-CM | POA: Diagnosis present

## 2024-10-17 DIAGNOSIS — R0902 Hypoxemia: Secondary | ICD-10-CM | POA: Diagnosis not present

## 2024-10-17 DIAGNOSIS — Z800 Family history of malignant neoplasm of digestive organs: Secondary | ICD-10-CM

## 2024-10-17 DIAGNOSIS — S60222A Contusion of left hand, initial encounter: Secondary | ICD-10-CM

## 2024-10-17 DIAGNOSIS — Z751 Person awaiting admission to adequate facility elsewhere: Secondary | ICD-10-CM

## 2024-10-17 DIAGNOSIS — Z87440 Personal history of urinary (tract) infections: Secondary | ICD-10-CM

## 2024-10-17 DIAGNOSIS — Z888 Allergy status to other drugs, medicaments and biological substances status: Secondary | ICD-10-CM

## 2024-10-17 DIAGNOSIS — I110 Hypertensive heart disease with heart failure: Secondary | ICD-10-CM | POA: Diagnosis present

## 2024-10-17 DIAGNOSIS — G4733 Obstructive sleep apnea (adult) (pediatric): Secondary | ICD-10-CM | POA: Diagnosis present

## 2024-10-17 DIAGNOSIS — M79671 Pain in right foot: Secondary | ICD-10-CM | POA: Diagnosis present

## 2024-10-17 DIAGNOSIS — Z9989 Dependence on other enabling machines and devices: Secondary | ICD-10-CM

## 2024-10-17 DIAGNOSIS — R9431 Abnormal electrocardiogram [ECG] [EKG]: Secondary | ICD-10-CM | POA: Diagnosis present

## 2024-10-17 DIAGNOSIS — N179 Acute kidney failure, unspecified: Secondary | ICD-10-CM | POA: Diagnosis present

## 2024-10-17 DIAGNOSIS — Z87892 Personal history of anaphylaxis: Secondary | ICD-10-CM

## 2024-10-17 DIAGNOSIS — A044 Other intestinal Escherichia coli infections: Secondary | ICD-10-CM | POA: Diagnosis not present

## 2024-10-17 DIAGNOSIS — Z90710 Acquired absence of both cervix and uterus: Secondary | ICD-10-CM

## 2024-10-17 DIAGNOSIS — Z887 Allergy status to serum and vaccine status: Secondary | ICD-10-CM

## 2024-10-17 DIAGNOSIS — Z8672 Personal history of thrombophlebitis: Secondary | ICD-10-CM

## 2024-10-17 LAB — CBC WITH DIFFERENTIAL/PLATELET
Abs Immature Granulocytes: 0.22 K/uL — ABNORMAL HIGH (ref 0.00–0.07)
Basophils Absolute: 0.1 K/uL (ref 0.0–0.1)
Basophils Relative: 0 %
Eosinophils Absolute: 0 K/uL (ref 0.0–0.5)
Eosinophils Relative: 0 %
HCT: 38.6 % (ref 36.0–46.0)
Hemoglobin: 11.8 g/dL — ABNORMAL LOW (ref 12.0–15.0)
Immature Granulocytes: 1 %
Lymphocytes Relative: 3 %
Lymphs Abs: 0.8 K/uL (ref 0.7–4.0)
MCH: 29.6 pg (ref 26.0–34.0)
MCHC: 30.6 g/dL (ref 30.0–36.0)
MCV: 97 fL (ref 80.0–100.0)
Monocytes Absolute: 2.6 K/uL — ABNORMAL HIGH (ref 0.1–1.0)
Monocytes Relative: 10 %
Neutro Abs: 22.6 K/uL — ABNORMAL HIGH (ref 1.7–7.7)
Neutrophils Relative %: 86 %
Platelets: 222 K/uL (ref 150–400)
RBC: 3.98 MIL/uL (ref 3.87–5.11)
RDW: 13.6 % (ref 11.5–15.5)
Smear Review: NORMAL
WBC: 26.3 K/uL — ABNORMAL HIGH (ref 4.0–10.5)
nRBC: 0 % (ref 0.0–0.2)

## 2024-10-17 LAB — COMPREHENSIVE METABOLIC PANEL WITH GFR
ALT: 16 U/L (ref 0–44)
ALT: 18 U/L (ref 0–44)
AST: 25 U/L (ref 15–41)
AST: 35 U/L (ref 15–41)
Albumin: 4.1 g/dL (ref 3.5–5.0)
Albumin: 3.6 g/dL (ref 3.5–5.0)
Alkaline Phosphatase: 144 U/L — ABNORMAL HIGH (ref 38–126)
Alkaline Phosphatase: 144 U/L — ABNORMAL HIGH (ref 38–126)
Anion gap: 14 (ref 5–15)
Anion gap: 9 (ref 5–15)
BUN: 21 mg/dL (ref 8–23)
BUN: 23 mg/dL (ref 8–23)
CO2: 29 mmol/L (ref 22–32)
CO2: 28 mmol/L (ref 22–32)
Calcium: 10.5 mg/dL — ABNORMAL HIGH (ref 8.9–10.3)
Calcium: 9.5 mg/dL (ref 8.9–10.3)
Chloride: 96 mmol/L — ABNORMAL LOW (ref 98–111)
Chloride: 99 mmol/L (ref 98–111)
Creatinine, Ser: 1.41 mg/dL — ABNORMAL HIGH (ref 0.44–1.00)
Creatinine, Ser: 1.2 mg/dL — ABNORMAL HIGH (ref 0.44–1.00)
GFR, Estimated: 38 mL/min — ABNORMAL LOW
GFR, Estimated: 47 mL/min — ABNORMAL LOW
Glucose, Bld: 144 mg/dL — ABNORMAL HIGH (ref 70–99)
Glucose, Bld: 113 mg/dL — ABNORMAL HIGH (ref 70–99)
Potassium: 3.4 mmol/L — ABNORMAL LOW (ref 3.5–5.1)
Potassium: 4.9 mmol/L (ref 3.5–5.1)
Sodium: 139 mmol/L (ref 135–145)
Sodium: 136 mmol/L (ref 135–145)
Total Bilirubin: 0.5 mg/dL (ref 0.0–1.2)
Total Bilirubin: 0.4 mg/dL (ref 0.0–1.2)
Total Protein: 6.8 g/dL (ref 6.5–8.1)
Total Protein: 5.9 g/dL — ABNORMAL LOW (ref 6.5–8.1)

## 2024-10-17 LAB — I-STAT CG4 LACTIC ACID, ED
Lactic Acid, Venous: 3.1 mmol/L (ref 0.5–1.9)
Lactic Acid, Venous: 2.7 mmol/L (ref 0.5–1.9)

## 2024-10-17 LAB — URINALYSIS, ROUTINE W REFLEX MICROSCOPIC
Bilirubin Urine: NEGATIVE
Glucose, UA: NEGATIVE mg/dL
Ketones, ur: NEGATIVE mg/dL
Nitrite: NEGATIVE
Protein, ur: NEGATIVE mg/dL
Specific Gravity, Urine: >1.046 — ABNORMAL HIGH (ref 1.005–1.030)
pH: 5 (ref 5.0–8.0)

## 2024-10-17 LAB — RESP PANEL BY RT-PCR (RSV, FLU A&B, COVID)  RVPGX2
Influenza A by PCR: NEGATIVE
Influenza B by PCR: NEGATIVE
Resp Syncytial Virus by PCR: NEGATIVE
SARS Coronavirus 2 by RT PCR: NEGATIVE

## 2024-10-17 LAB — PROTIME-INR
INR: 1 (ref 0.8–1.2)
Prothrombin Time: 13.6 s (ref 11.4–15.2)

## 2024-10-17 LAB — LIPASE, BLOOD: Lipase: 15 U/L (ref 11–51)

## 2024-10-17 LAB — CBC
HCT: 41.5 % (ref 36.0–46.0)
Hemoglobin: 12.9 g/dL (ref 12.0–15.0)
MCH: 29.9 pg (ref 26.0–34.0)
MCHC: 31.1 g/dL (ref 30.0–36.0)
MCV: 96.3 fL (ref 80.0–100.0)
Platelets: 260 K/uL (ref 150–400)
RBC: 4.31 MIL/uL (ref 3.87–5.11)
RDW: 13.4 % (ref 11.5–15.5)
WBC: 24 K/uL — ABNORMAL HIGH (ref 4.0–10.5)
nRBC: 0 % (ref 0.0–0.2)

## 2024-10-17 LAB — LACTIC ACID, PLASMA: Lactic Acid, Venous: 2.6 mmol/L (ref 0.5–1.9)

## 2024-10-17 LAB — CBG MONITORING, ED: Glucose-Capillary: 126 mg/dL — ABNORMAL HIGH (ref 70–99)

## 2024-10-17 MED ORDER — ENOXAPARIN SODIUM 40 MG/0.4ML IJ SOSY
40.0000 mg | PREFILLED_SYRINGE | INTRAMUSCULAR | Status: DC
  Administered 2024-10-17 – 2024-10-25 (×9): 40 mg via SUBCUTANEOUS
  Filled 2024-10-17 (×2): qty 0.4

## 2024-10-17 MED ORDER — METRONIDAZOLE 500 MG/100ML IV SOLN
500.0000 mg | Freq: Two times a day (BID) | INTRAVENOUS | Status: DC
  Administered 2024-10-17 – 2024-10-21 (×8): 500 mg via INTRAVENOUS
  Filled 2024-10-17: qty 100

## 2024-10-17 MED ORDER — ACETAMINOPHEN 650 MG RE SUPP: 650.0000 mg | Freq: Four times a day (QID) | RECTAL | Status: DC | PRN

## 2024-10-17 MED ORDER — PANTOPRAZOLE SODIUM 40 MG PO TBEC
40.0000 mg | DELAYED_RELEASE_TABLET | Freq: Every day | ORAL | Status: DC
  Administered 2024-10-17 – 2024-10-21 (×5): 40 mg via ORAL
  Filled 2024-10-17: qty 1

## 2024-10-17 MED ORDER — IOHEXOL 300 MG/ML  SOLN
80.0000 mL | Freq: Once | INTRAMUSCULAR | Status: AC | PRN
  Administered 2024-10-17: 80 mL via INTRAVENOUS

## 2024-10-17 MED ORDER — VERAPAMIL HCL ER 180 MG PO TBCR
360.0000 mg | EXTENDED_RELEASE_TABLET | Freq: Every day | ORAL | Status: DC
  Administered 2024-10-17 – 2024-10-26 (×10): 360 mg via ORAL
  Filled 2024-10-17 (×3): qty 2

## 2024-10-17 MED ORDER — METRONIDAZOLE 500 MG/100ML IV SOLN
500.0000 mg | Freq: Once | INTRAVENOUS | Status: AC
  Administered 2024-10-17: 500 mg via INTRAVENOUS
  Filled 2024-10-17: qty 100

## 2024-10-17 MED ORDER — ONDANSETRON HCL 4 MG/2ML IJ SOLN
4.0000 mg | Freq: Four times a day (QID) | INTRAMUSCULAR | Status: DC | PRN
  Administered 2024-10-22 – 2024-10-24 (×3): 4 mg via INTRAVENOUS

## 2024-10-17 MED ORDER — SODIUM CHLORIDE 0.9 % IV BOLUS
1000.0000 mL | Freq: Once | INTRAVENOUS | Status: AC
  Administered 2024-10-17: 1000 mL via INTRAVENOUS

## 2024-10-17 MED ORDER — VANCOMYCIN HCL 125 MG PO CAPS
125.0000 mg | ORAL_CAPSULE | Freq: Four times a day (QID) | ORAL | Status: DC
  Administered 2024-10-17 – 2024-10-21 (×15): 125 mg via ORAL
  Filled 2024-10-17 (×3): qty 1

## 2024-10-17 MED ORDER — SODIUM CHLORIDE 0.9 % IV SOLN
2.0000 g | Freq: Once | INTRAVENOUS | Status: AC
  Administered 2024-10-17: 2 g via INTRAVENOUS
  Filled 2024-10-17: qty 20

## 2024-10-17 MED ORDER — LEVOTHYROXINE SODIUM 50 MCG PO TABS
50.0000 ug | ORAL_TABLET | Freq: Every day | ORAL | Status: DC
  Administered 2024-10-18 – 2024-10-26 (×9): 50 ug via ORAL
  Filled 2024-10-17 (×2): qty 1

## 2024-10-17 MED ORDER — LACTATED RINGERS IV BOLUS
1000.0000 mL | Freq: Once | INTRAVENOUS | Status: AC
  Administered 2024-10-17: 1000 mL via INTRAVENOUS

## 2024-10-17 MED ORDER — MONTELUKAST SODIUM 10 MG PO TABS
10.0000 mg | ORAL_TABLET | Freq: Every day | ORAL | Status: DC
  Administered 2024-10-17 – 2024-10-25 (×9): 10 mg via ORAL
  Filled 2024-10-17 (×2): qty 1

## 2024-10-17 MED ORDER — VANCOMYCIN HCL 125 MG PO CAPS
125.0000 mg | ORAL_CAPSULE | Freq: Once | ORAL | Status: AC
  Administered 2024-10-17: 125 mg via ORAL
  Filled 2024-10-17: qty 1

## 2024-10-17 MED ORDER — SODIUM CHLORIDE 0.9 % IV SOLN
2.0000 g | INTRAVENOUS | Status: DC
  Administered 2024-10-18 – 2024-10-19 (×2): 2 g via INTRAVENOUS

## 2024-10-17 MED ORDER — ONDANSETRON HCL 4 MG/2ML IJ SOLN
4.0000 mg | Freq: Once | INTRAMUSCULAR | Status: AC
  Administered 2024-10-17: 4 mg via INTRAVENOUS
  Filled 2024-10-17: qty 2

## 2024-10-17 MED ORDER — ONDANSETRON HCL 4 MG PO TABS: 4.0000 mg | ORAL_TABLET | Freq: Four times a day (QID) | ORAL | Status: DC | PRN

## 2024-10-17 MED ORDER — LACTATED RINGERS IV SOLN
150.0000 mL/h | INTRAVENOUS | Status: AC
  Administered 2024-10-17 (×2): 150 mL/h via INTRAVENOUS

## 2024-10-17 MED ORDER — POTASSIUM CHLORIDE CRYS ER 20 MEQ PO TBCR
40.0000 meq | EXTENDED_RELEASE_TABLET | Freq: Once | ORAL | Status: AC
  Administered 2024-10-17: 40 meq via ORAL
  Filled 2024-10-17: qty 2

## 2024-10-17 MED ORDER — ACETAMINOPHEN 325 MG PO TABS
650.0000 mg | ORAL_TABLET | Freq: Four times a day (QID) | ORAL | Status: DC | PRN
  Administered 2024-10-18 – 2024-10-23 (×7): 650 mg via ORAL

## 2024-10-17 MED ORDER — GABAPENTIN 300 MG PO CAPS
300.0000 mg | ORAL_CAPSULE | Freq: Every day | ORAL | Status: DC
  Administered 2024-10-17 – 2024-10-25 (×9): 300 mg via ORAL
  Filled 2024-10-17 (×2): qty 1

## 2024-10-17 NOTE — ED Notes (Signed)
 Pt incontinent of stool- cleaned pt, changed linens, placed pt on purewick

## 2024-10-17 NOTE — ED Notes (Signed)
 Pts left hand ace wrapped and ice pack applied

## 2024-10-17 NOTE — ED Provider Notes (Signed)
 " [facility] AT [facility] Provider Note   CSN: [id number] Arrival date & time: 10/17/24  9277     Patient presents with: Fall and Near Syncope   Courtney [name] is a 78 y.o. female.   Patient is a 79 year old female with a past medical history of chronic UTIs on Macrobid at baseline, prior C. difficile, hypertension, CAD, CVA, hypothyroidism presenting to the emergency department with dizziness and a fall.  The patient states that she started to feel sick last night and had 2 episodes of diarrhea with left lower quadrant pain.  She states that she woke up this morning to go to the bathroom and felt dizzy upon standing causing her to fall.  She is unsure if she hit her head but denies any loss of consciousness.  She states that she started to feel nauseous and vomited once this morning.  She states that she has not had any known fevers but feels chills.  She states that she has had a mild nonproductive cough with some shortness of breath.  She denies any congestion or sore throat.  She denies any dysuria or hematuria.  She denies any known sick contacts.  The history is provided by the patient.  Fall  Near Syncope       Prior to Admission medications  Medication Sig Start Date End Date Taking? Authorizing Provider  albuterol  (VENTOLIN  HFA) 108 (90 Base) MCG/ACT inhaler Inhale 1 puff into the lungs every 6 (six) hours as needed for wheezing or shortness of breath. 02/22/21   [provider]  aspirin  325 MG tablet Take 325 mg by mouth daily.    [provider]  atorvastatin  (LIPITOR ) 80 MG tablet Take 80 mg by mouth every evening. 02/21/21   [provider]  benzonatate  (TESSALON ) 200 MG capsule Take 200 mg by mouth 3 (three) times daily as needed for cough.    [provider]  Biotin 5000 MCG CAPS Take 5,000 mcg by mouth daily.    [provider]  buPROPion  (WELLBUTRIN  SR) 150 MG 12 hr tablet Take 150 mg by mouth  daily.    [provider]  Calcium  Carb-Cholecalciferol  (CALCIUM  600/VITAMIN D  PO) Take 1 tablet by mouth daily.    [provider]  carboxymethylcellulose (REFRESH PLUS) 0.5 % SOLN Place 1 drop into both eyes daily as needed (dry eyes).    [provider]  cefadroxil  (DURICEF) 500 MG capsule Take 1 capsule (500 mg total) by mouth 2 (two) times daily. 07/04/24   Silver Wonda LABOR, PA  Cholecalciferol  (VITAMIN D3) 25 MCG (1000 UT) CAPS Take 1,000 Units by mouth daily.    [provider]  cyanocobalamin  (,VITAMIN B-12,) 1000 MCG/ML injection Inject 1,000 mcg into the muscle every 30 (thirty) days. 02/23/21   [provider]  diclofenac  Sodium (VOLTAREN ) 1 % GEL Apply 2 g topically 4 (four) times daily as needed (pain). 12/02/20   [provider]  docusate sodium  (COLACE) 100 MG capsule Take 100 mg by mouth daily.    [provider]  escitalopram  (LEXAPRO ) 20 MG tablet Take 20 mg by mouth daily. 11/24/20   [provider]  estradiol (ESTRACE) 0.1 MG/GM vaginal cream Place vaginally. 07/19/24   [provider]  furosemide  (LASIX ) 40 MG tablet Take 40 mg by mouth daily as needed for fluid or edema. 12/11/22   [provider]  gabapentin  (NEURONTIN ) 100 MG capsule Take 100 mg by mouth 2 (two) times daily. Take  100mg  in the morning and 100mg  in the afternoon. To be taken in addition to 300mg  capsule nightly for a total daily dose of 500mg  02/13/21   [provider]  gabapentin  (NEURONTIN ) 300 MG capsule Take 300 mg by mouth at bedtime. To be taken in addition to 100mg  capsule in the morning and 100mg  capsule in the afternoon for a total daily dose of 500mg     [provider]  ipratropium (ATROVENT) 0.03 % nasal spray Place 1 spray into both nostrils daily. 04/01/23 04/11/25  [provider]  levothyroxine  (SYNTHROID ) 50 MCG tablet Take 50 mcg by mouth daily before breakfast. 02/09/21   [provider]  LORazepam  (ATIVAN ) 1 MG tablet Take 1 tablet (1 mg total) by mouth at bedtime. 01/03/22   Bryn Bernardino NOVAK, MD  meclizine  (ANTIVERT ) 25 MG tablet Take 25 mg by mouth every 8 (eight) hours as needed for dizziness.    [provider]  melatonin 5 MG TABS Take 5 mg by mouth at bedtime.    [provider]  montelukast  (SINGULAIR ) 10 MG tablet Take 10 mg by mouth at bedtime. 02/13/21   [provider]  multivitamin-lutein (OCUVITE-LUTEIN) CAPS capsule Take 1 capsule by mouth daily.    [provider]  nitrofurantoin (MACRODANTIN) 100 MG capsule Take by mouth. 09/14/24   [provider]  pantoprazole  (PROTONIX ) 40 MG tablet Take 40 mg by mouth daily.    [provider]  potassium chloride  (MICRO-K ) 10 MEQ CR capsule Take 10 mEq by mouth daily.    [provider]  PRESCRIPTION MEDICATION CPAP- At bedtime    [provider]  TRELEGY ELLIPTA 100-62.5-25 MCG/ACT AEPB Inhale 1 puff into the lungs daily.    [provider]  verapamil  (CALAN -SR) 120 MG CR tablet Take 360 mg by mouth daily. 02/13/21   [provider]  Vibegron  (GEMTESA ) 75 MG TABS Take by mouth.    [provider]    Allergies: Tetanus toxoid-containing vaccines, Tetanus-diphtheria toxoids td, Meloxicam, Phentermine, Sulfa antibiotics, and Zithromax [azithromycin]    Review of Systems  Cardiovascular:  Positive for near-syncope.    Updated Vital Signs BP 123/64   Pulse 95   Temp 99.8 F (37.7 C)   Resp 17   Ht 5' 2 (1.575 m)   Wt 88.5 kg   SpO2 91%   BMI 35.67 kg/m   Physical Exam Vitals and nursing note reviewed.  Constitutional:      General: She is not in acute distress.    Appearance: Normal appearance. She is ill-appearing.  HENT:     Head: Normocephalic and atraumatic.     Nose: Nose normal.     Mouth/Throat:     Mouth: Mucous membranes are dry.     Pharynx: Oropharynx is clear.  Eyes:     Extraocular  Movements: Extraocular movements intact.     Conjunctiva/sclera: Conjunctivae normal.     Pupils: Pupils are equal, round, and reactive to light.     Comments: Small bruise to left lower eye  Neck:     Comments: No midline neck tenderness Cardiovascular:     Rate and Rhythm: Normal rate and regular rhythm.     Heart sounds: Normal heart sounds.  Pulmonary:     Effort: Pulmonary effort is normal.     Breath sounds: Normal breath sounds.  Abdominal:     General: Abdomen is flat.     Palpations: Abdomen is soft.     Tenderness: There is  abdominal tenderness (LLQ). There is no right CVA tenderness, left CVA tenderness, guarding or rebound.  Musculoskeletal:     Cervical back: Normal range of motion and neck supple.     Comments: No midline back tenderness No tenderness to bilateral UE Pelvis stable, non tender No tenderness to LLE Tenderness to palpation of R calf/shin  Skin:    General: Skin is warm and dry.     Findings: Bruising (L hand, L shin) present.  Neurological:     General: No focal deficit present.     Mental Status: She is alert and oriented to person, place, and time.  Psychiatric:        Mood and Affect: Mood normal.        Behavior: Behavior normal.     (all labs ordered are listed, but only abnormal results are displayed) Labs Reviewed  COMPREHENSIVE METABOLIC PANEL WITH GFR - Abnormal; Notable for the following components:      Result Value   Potassium 3.4 (*)    Chloride 96 (*)    Glucose, Bld 144 (*)    Creatinine, Ser 1.41 (*)    Calcium  10.5 (*)    Alkaline Phosphatase 144 (*)    GFR, Estimated 38 (*)    All other components within normal limits  CBC - Abnormal; Notable for the following components:   WBC 24.0 (*)    All other components within normal limits  CBG MONITORING, ED - Abnormal; Notable for the following components:   Glucose-Capillary 126 (*)    All other components within normal limits  I-STAT CG4 LACTIC ACID, ED - Abnormal; Notable  for the following components:   Lactic Acid, Venous 3.1 (*)    All other components within normal limits  I-STAT CG4 LACTIC ACID, ED - Abnormal; Notable for the following components:   Lactic Acid, Venous 2.7 (*)    All other components within normal limits  RESP PANEL BY RT-PCR (RSV, FLU A&B, COVID)  [id number]  CULTURE, BLOOD (ROUTINE X 2)  CULTURE, BLOOD (ROUTINE X 2)  LIPASE, BLOOD  PROTIME-INR  URINALYSIS, ROUTINE W REFLEX MICROSCOPIC    EKG: EKG Interpretation Date/Time:  Sunday October 17 2024 07:35:08 EST Ventricular Rate:  90 PR Interval:  155 QRS Duration:  96 QT Interval:  446 QTC Calculation: 546 R Axis:   66  Text Interpretation: Sinus rhythm Nonspecific T abnrm, anterolateral leads Prolonged QT interval Interpretation limited secondary to artifact Otherwise no significant change Confirmed by Ellouise Fine ([id number]) on 10/17/2024 8:09:36 AM  Radiology: CT ABDOMEN PELVIS W CONTRAST Result Date: 10/17/2024 EXAM: CT ABDOMEN AND PELVIS WITH CONTRAST 10/17/2024 09:18:11 AM TECHNIQUE: CT of the abdomen and pelvis was performed with the administration of 80 mL of iohexol  (OMNIPAQUE ) 300 MG/ML solution. Multiplanar reformatted images are provided for review. Automated exposure control, iterative reconstruction, and/or weight-based adjustment of the mA/kV was utilized to reduce the radiation dose to as low as reasonably achievable. COMPARISON: CT abdomen and pelvis 09/02/2024. CLINICAL HISTORY: 78 year old female with dizziness, fall in bathroom, abdominal pain, and diarrhea. FINDINGS: LOWER CHEST: Chronic elevation of the right hemidiaphragm is stable. Calcified coronary artery atherosclerosis. Coarsely calcified benign appearing right breast mass on series 2 image 5 (such as chronic fat necrosis), appears stable from previous CT abdomen and pelvis in June this year. No pericardial effusion. Stable lung base atelectasis. LIVER: The liver is unremarkable. GALLBLADDER AND BILE DUCTS:  Chronic cholecystectomy. No biliary ductal dilatation. SPLEEN: No acute abnormality. PANCREAS: Mostly atrophied pancreas. ADRENAL GLANDS:  No acute abnormality. KIDNEYS, URETERS AND BLADDER: No stones in the kidneys or ureters. No hydronephrosis. No perinephric or periureteral stranding. Urinary bladder is unremarkable. Absent renal contrast excretion, or little to no renal contrast excretion on the delayed images, raising the possibility of acute intrinsic renal insufficiency. GI AND BOWEL: Retained fluid in the stomach. Chronically redundant large bowel with severe diverticulosis throughout the descending and sigmoid colon. There is circumferential wall thickening and active inflammation affecting the transverse colon and splenic flexure diffusely (coronal image 54 and series 2 image 28) with mild regional mesenteric inflammation. The wall thickening and inflammation abates in the sigmoid colon. The hepatic flexure and right colon are relatively spared. Decompressed small bowel loops with flocculated material in the terminal ileum. Appendix not well delineated but no evidence of acute inflammation at the cecum. There is no bowel obstruction. A small fat-containing umbilical hernia is stable. PERITONEUM AND RETROPERITONEUM: No ascites. No free air. VASCULATURE: Large arterial and portal venous structures in the abdomen and pelvis appear patent. Calcified aortic atherosclerosis with mild tortuosity. LYMPH NODES: No lymphadenopathy. REPRODUCTIVE ORGANS: Chronically absent uterus, diminutive or absent ovaries. BONES AND SOFT TISSUES: Chronic right flank generator device with no attached electrical lead identified now. Chronic lumbar fusion changes and severe superimposed lower thoracic and lumbar spine degeneration. Associated moderate dextroconvex scoliosis and widespread vacuum disc. No acute osseous abnormality. IMPRESSION: 1. Long segment acute colitis, most affecting the transverse colon and splenic flexure. No  bowel obstruction, perforation, or abscess. Underlying highly redundant large bowel with extensive retained stool. 2. Little to no renal contrast excretion, raising the possibility of acute renal insufficiency. 3. No acute traumatic injury identified. Electronically signed by: Helayne Hurst MD 10/17/2024 09:37 AM EST RP Workstation: [id number]   CT Cervical Spine Wo Contrast Result Date: 10/17/2024 EXAM: CT CERVICAL SPINE WITHOUT CONTRAST 10/17/2024 09:18:11 AM TECHNIQUE: CT of the cervical spine was performed without the administration of intravenous contrast. Multiplanar reformatted images are provided for review. Automated exposure control, iterative reconstruction, and/or weight based adjustment of the mA/kV was utilized to reduce the radiation dose to as low as reasonably achievable. COMPARISON: CT head reported separately today and cervical spine CT 06/25/2024. CLINICAL HISTORY: Patient is a 78 year old female. Neck trauma, dizziness, fall in bathroom, left orbital abrasion. FINDINGS: BONES AND ALIGNMENT: Stable cervical vertebral alignment. No acute fracture or traumatic malalignment. Chronic C3-C6 ACDF appears stable. DEGENERATIVE CHANGES: Chronic C3-C6 ACDF appears stable. Chronic facet arthropathy at the C2-C3 adjacent segment. Advanced chronic disc and facet degeneration at the C6-C7 adjacent segment, including vacuum disc. SOFT TISSUES: Bulky chronic right cervical carotid calcified atherosclerosis. Stable non-contrast visible neck soft tissues. Stable visible non-contrast thoracic inlet. LIMITATIONS/ARTIFACTS: Mild motion artifact. IMPRESSION: 1. Mildly motion degraded, no acute traumatic injury identified in the cervical spine. 2. Chronic C3-C6 ACDF, with advanced adjacent segment degeneration. Electronically signed by: Helayne Hurst MD 10/17/2024 09:29 AM EST RP Workstation: [id number]   CT Head Wo Contrast Result Date: 10/17/2024 EXAM: CT HEAD WITHOUT CONTRAST 10/17/2024 09:18:11 AM TECHNIQUE: CT  of the head was performed without the administration of intravenous contrast. Automated exposure control, iterative reconstruction, and/or weight-based adjustment of the mA/kV was utilized to reduce the radiation dose to as low as reasonably achievable. COMPARISON: CT cervical spine reported separately today. CT head 06/25/2024. CLINICAL HISTORY: 78 year old female with minor head trauma, dizziness, fall in bathroom, and left orbital abrasion. FINDINGS: BRAIN AND VENTRICLES: No acute hemorrhage. No evidence of acute infarct. No hydrocephalus. No extra-axial collection. No mass effect or  midline shift. Stable cerebral volume. Chronically advanced bilateral cerebral white matter hypodensity and chronic lacunar infarct of the right corona radiata and medial lentiform. Stable gray white differentiation. Hyperdensity. ORBITS: Mild soft tissue swelling and stranding lateral to the left orbit, overlying the left zygoma (series 506 image 3) which appears to remain intact. No soft tissue gas. Orbital soft tissues otherwise stable. SINUSES: Visible paranasal sinuses, tympanic cavities and mastoids are clear. SOFT TISSUES AND SKULL: Mild soft tissue swelling and stranding lateral to the left orbit, overlying the left zygoma (series 506 image 3) which appears to remain intact. No soft tissue gas. No skull fracture. Calcified atherosclerosis at the skull base. IMPRESSION: 1. Left periorbital soft tissue injury. 2. No acute intracranial abnormality. Stable non-contrast CT appearance of chronic small vessel disease. Electronically signed by: Helayne Hurst MD 10/17/2024 09:26 AM EST RP Workstation: [id number]   DG Hand Complete Left Result Date: 10/17/2024 CLINICAL DATA:  Fall.  Left hand pain and bruising. EXAM: LEFT HAND - COMPLETE 3+ VIEW COMPARISON:  None Available. FINDINGS: There is no evidence of fracture or dislocation. Osteoarthritis is seen involving the 2nd and 3rd DIP joints and interphalangeal joint of the thumb.  Moderate to severe osteoarthritis is also seen involving the base of the thumb, STT joint complex, and radiocarpal joint. IMPRESSION: No acute findings. Osteoarthritis, as described above. Electronically Signed   By: Norleen DELENA Kil M.D.   On: 10/17/2024 09:24   DG Tibia/Fibula Right Result Date: 10/17/2024 CLINICAL DATA:  Fall.  Right leg injury and pain. EXAM: RIGHT TIBIA AND FIBULA - 2 VIEW COMPARISON:  None Available. FINDINGS: There is no evidence of fracture or other focal bone lesions. Prior knee arthroplasty noted. Peripheral vascular calcification noted. IMPRESSION: No acute findings. Electronically Signed   By: Norleen DELENA Kil M.D.   On: 10/17/2024 09:06   DG Chest Port 1 View Result Date: 10/17/2024 CLINICAL DATA:  Dizziness.  Fall. EXAM: PORTABLE CHEST 1 VIEW COMPARISON:  04/13/2024 FINDINGS: The heart size and mediastinal contours are within normal limits. Low lung volumes again noted. No evidence of pneumothorax or hemothorax. Both lungs are clear. Cervical spine fusion hardware again noted. IMPRESSION: Low lung volumes. No acute findings. Electronically Signed   By: Norleen DELENA Kil M.D.   On: 10/17/2024 09:05     Procedures   Medications Ordered in the ED  vancomycin  (VANCOCIN ) capsule 125 mg (has no administration in time range)  sodium chloride  0.9 % bolus 1,000 mL (0 mLs Intravenous Stopped 10/17/24 0947)  ondansetron  (ZOFRAN ) injection 4 mg (4 mg Intravenous Given 10/17/24 0807)  potassium chloride  SA (KLOR-CON  M) CR tablet 40 mEq (40 mEq Oral Given 10/17/24 1000)  iohexol  (OMNIPAQUE ) 300 MG/ML solution 80 mL (80 mLs Intravenous Contrast Given 10/17/24 0859)  lactated ringers  bolus 1,000 mL (0 mLs Intravenous Stopped 10/17/24 1123)  cefTRIAXone  (ROCEPHIN ) 2 g in sodium chloride  0.9 % 100 mL IVPB (0 g Intravenous Stopped 10/17/24 1153)    And  metroNIDAZOLE  (FLAGYL ) IVPB 500 mg (0 mg Intravenous Stopped 10/17/24 1117)    Clinical Course as of 10/17/24 1248  Sun Oct 17, 2024  0844  Labs with AKI, mild hypokalemia. She is receiving fluids, will replete potassium. [VK]  [id number] Elevated lactic and leukocytosis concerning for infection, fluids added. Does not meet sepsis criteria with normal vitals and will hold off on antibiotics until we have known source with history of c diff in the past.  [VK]  0934 No acute traumatic injury on imaging. CTAP read pending. [VK]  9048 CTAP with colitis.  [VK]  1007 Patient will be given IV antibiotics, family report that GI has recommended prophylactic PO vanc with antibiotics with history of C diff. Patient did use bedside commode but needed assistance with standing and very weak. Will repeat lactic after IVF but likely will need admission for dehydration/weakness in the setting of colitis.  [VK]  1228 Rpt lactic still elevated but downtrending. Will recommend admission. [VK]    Clinical Course User Index [VK] Kingsley, Arvella Massingale K, DO                                 Medical Decision Making This patient presents to the ED with chief complaint(s) of dizziness, fall with pertinent past medical history of chronic UTIs on Macrobid at baseline, prior C. difficile, hypertension, CAD, CVA, hypothyroidism which further complicates the presenting complaint. The complaint involves an extensive differential diagnosis and also carries with it a high risk of complications and morbidity.    The differential diagnosis includes ICH, mass effect, cervical spine fracture, periorbital fracture, left hand fracture, right tib-fib fracture, contusion, no other traumatic injury seen, arrhythmia, anemia, dehydration, electrolyte abnormality, diverticulitis, other intra-abdominal infection, UTI, sepsis, viral syndrome, gastroenteritis  Additional history obtained: Additional history obtained from spouse and EMS  Records reviewed Nursing Home Documents  ED Course and Reassessment: On patient's arrival she is hemodynamically stable in no acute distress though is  ill-appearing.  Patient will have workup to evaluate for traumatic injury as well as EKG, labs, imaging and CT abdomen and pelvis to evaluate for infection or etiology of her symptoms and dizziness.  She was given fluids and nausea control, declined any pain control at this time and will be closely reassessed.  Independent labs interpretation:  The following labs were independently interpreted: leukocytosis, elevated lactic -> downtrending, AKI  Independent visualization of imaging: - I independently visualized the following imaging with scope of interpretation limited to determining acute life threatening conditions related to emergency care: CTH/C-spine, CXR, R tib/fib XR, L hand XR, CTAP, which revealed colitis, no acute traumatic injury  Consultation: - Consulted or discussed management/test interpretation w/ external professional: hospitalist  Consideration for admission or further workup: patient requires admission for weakness in the setting of colitis and AKI Social Determinants of health: N/A    Amount and/or Complexity of Data Reviewed Labs: ordered. Radiology: ordered.  Risk Prescription drug management. Decision regarding hospitalization.       Final diagnoses:  Colitis  AKI (acute kidney injury)  Fall, initial encounter  Hematoma of left hand    ED Discharge Orders     None          Ellouise Richerd POUR, DO 10/17/24 1248  "

## 2024-10-17 NOTE — H&P (Signed)
 " History and Physical    Patient: Courtney [name] FMW:[id number] DOB: 08-Feb-1946 DOA: 10/17/2024 DOS: the patient was seen and examined on 10/17/2024 PCP: Ransom Other, MD  Patient coming from: ALF/ILF  Chief Complaint:  Chief Complaint  Patient presents with   Fall   Near Syncope   HPI: Courtney [name] is a 78 y.o. female with medical history significant of anemia, hyperlipidemia, aortic atherosclerosis, aortic calcification, osteoarthritis of the wrist, asthma, bipolar disorder, depression, hiatal hernia, nephrolithiasis, hypertension, hypothyroidism, mitral valve prolapse, OSA on CPAP, pulmonary embolism, history of CVA with residual right-sided hemiparesis, thrombophlebitis, vertigo, multiple episodes of C. difficile colitis, diverticulosis, diverticulitis who started having diarrhea multiple episodes yesterday and is coming to the emergency department after having a fall at her ALF.  He denied fever, chills, rhinorrhea, sore throat, wheezing or hemoptysis.  No chest pain, palpitations, diaphoresis, PND, orthopnea or pitting edema of the lower extremities.  No abdominal pain, nausea, emesis, diarrhea, constipation, melena or hematochezia.  No flank pain, dysuria, frequency or hematuria.  No polyuria, polydipsia, polyphagia or blurred vision.   Lab work: CBC showed a white count of 24.0, hemoglobin 12.9 g/dL and platelets 739.  Normal PT and INR.  Unremarkable lipase.  Negative coronavirus, influenza and RSV PCR test.  Lactic acid was 3.1 then 2.7 mmol/L.  CMP showed normal sodium, CO2 and anion gap.  A potassium 3.4 and chloride of 96 mmol/L, alkaline phosphatase 144 with the rest of the LFTs within normal limits.  Glucose 144, BUN 21, creatinine 1.41 and calcium  10.5 mg/dL.  Imaging: Portable 1 view chest radiograph showing low lung volumes.  No acute findings.  Right tibia and fibula negative.  Left ankle x-ray showing osteoarthritis but no acute findings.  CT head without contrast showing  left periorbital soft tissue's injury.  No acute intracranial abnormality with stable appearance of chronic small vessel disease.  CT cervical spine without contrast was mildly motion degraded, but no acute traumatic injury with same.  There is chronic C3-C6  ACDF, with advanced adjacent segment degeneration.  CT abdomen/pelvis with contrast showing 11 cm and acute colitis, mostly affecting the transverse colon and splenic flexure.  No bowel obstruction, perforation or abscess.  Underlying highly redundant large bowel with extensive retained stool.  Little to no renal contrast excretion, raising the possibility of acute renal insufficiency.  No acute traumatic injury identified.   ED course: Initial vital signs were temperature 99 F, pulse 90, respiration 20, BP 125/73 mmHg O2 sat 96% on room air.  The patient received 1000 mL of normal saline bolus, 1000 mL of LR bolus, ondansetron  4 mg IVP x 1, KCl 40 mEq p.o. x 1, ceftriaxone  2 g IVPB x 1 dose and metronidazole  500 mg IVPB.  Review of Systems: As mentioned in the history of present illness. All other systems reviewed and are negative. Past Medical History:  Diagnosis Date   Anemia    Aortic atherosclerosis    Arthritis    Bilateral Wrists   Asthma    Bipolar disorder (HCC)    Coronary artery calcification    Depression    Diverticulitis    Headache    Hiatal hernia    History of kidney stones 2006   Hypertension    Hypothyroidism (acquired) 04/02/2021   Kidney stone    Mitral valve prolapse    OSA (obstructive sleep apnea) 04/02/2021   wears CPAP   Panic attack    PONV (postoperative nausea and vomiting)    Pulmonary  embolus (HCC)    Stroke [facility])    Thrombophlebitis    Vertigo    Past Surgical History:  Procedure Laterality Date   ABDOMINAL HYSTERECTOMY     ACHILLES TENDON SURGERY Right    ANTERIOR CERVICAL DECOMP/DISCECTOMY FUSION N/A 03/28/2021   Procedure: Cervical three-four Cervical four-five Cervical five-six Anterior  cervical decompression/discectomy/fusion/interbody prosthesis/plate/screws;  Surgeon: Mavis Purchase, MD;  Location: [facility] OR;  Service: Neurosurgery;  Laterality: N/A;   BACK SURGERY     BREAST REDUCTION SURGERY     BUNIONECTOMY Right    CARPAL TUNNEL RELEASE Right 06/04/2023   Procedure: CARPAL TUNNEL RELEASE;  Surgeon: Mavis Purchase, MD;  Location: [facility] OR;  Service: Neurosurgery;  Laterality: Right;   CHOLECYSTECTOMY     EYE SURGERY Bilateral 2000   Cataract   JOINT REPLACEMENT Bilateral    Knee replacement   TOE SURGERY Right    hammer toe surgery   TONSILLECTOMY     ULNAR NERVE TRANSPOSITION Right 06/04/2023   Procedure: ULNAR NERVE DECOMPRESSION/TRANSPOSITION;  Surgeon: Mavis Purchase, MD;  Location: [facility] OR;  Service: Neurosurgery;  Laterality: Right;   Social History:  reports that she has never smoked. She has never used smokeless tobacco. She reports current alcohol use. She reports that she does not currently use drugs.  Allergies[1]  Family History  Problem Relation Age of Onset   Stroke Father    Hyperlipidemia Father    Esophageal cancer Maternal Uncle    Breast cancer Paternal Aunt        9s   CAD Neg Hx     Prior to Admission medications  Medication Sig Start Date End Date Taking? Authorizing Provider  albuterol  (VENTOLIN  HFA) 108 (90 Base) MCG/ACT inhaler Inhale 1 puff into the lungs every 6 (six) hours as needed for wheezing or shortness of breath. 02/22/21   [provider]  aspirin  325 MG tablet Take 325 mg by mouth daily.    [provider]  atorvastatin  (LIPITOR ) 80 MG tablet Take 80 mg by mouth every evening. 02/21/21   [provider]  benzonatate  (TESSALON ) 200 MG capsule Take 200 mg by mouth 3 (three) times daily as needed for cough.    [provider]  Biotin 5000 MCG CAPS Take 5,000 mcg by mouth daily.    [provider]  buPROPion  (WELLBUTRIN  SR) 150 MG 12 hr tablet Take 150 mg by mouth daily.    [provider]  Calcium  Carb-Cholecalciferol  (CALCIUM  600/VITAMIN D  PO) Take 1 tablet by mouth daily.    [provider]  carboxymethylcellulose (REFRESH PLUS) 0.5 % SOLN Place 1 drop into both eyes daily as needed (dry eyes).    [provider]  cefadroxil  (DURICEF) 500 MG capsule Take 1 capsule (500 mg total) by mouth 2 (two) times daily. 07/04/24   Silver Wonda LABOR, PA  Cholecalciferol  (VITAMIN D3) 25 MCG (1000 UT) CAPS Take 1,000 Units by mouth daily.    [provider]  cyanocobalamin  (,VITAMIN B-12,) 1000 MCG/ML injection Inject 1,000 mcg into the muscle every 30 (thirty) days. 02/23/21   [provider]  diclofenac  Sodium (VOLTAREN ) 1 % GEL Apply 2 g topically 4 (four) times daily as needed (pain). 12/02/20   [provider]  docusate sodium  (COLACE) 100 MG capsule Take 100 mg by mouth daily.    [provider]  escitalopram  (LEXAPRO ) 20 MG tablet Take 20 mg by mouth daily. 11/24/20   [provider]  estradiol (ESTRACE) 0.1 MG/GM vaginal cream Place vaginally.  07/19/24   [provider]  furosemide  (LASIX ) 40 MG tablet Take 40 mg by mouth daily as needed for fluid or edema. 12/11/22   [provider]  gabapentin  (NEURONTIN ) 100 MG capsule Take 100 mg by mouth 2 (two) times daily. Take 100mg  in the morning and 100mg  in the afternoon. To be taken in addition to 300mg  capsule nightly for a total daily dose of 500mg  02/13/21   [provider]  gabapentin  (NEURONTIN ) 300 MG capsule Take 300 mg by mouth at bedtime. To be taken in addition to 100mg  capsule in the morning and 100mg  capsule in the afternoon for a total daily dose of 500mg     [provider]  ipratropium (ATROVENT) 0.03 % nasal spray Place 1 spray into both nostrils daily. 04/01/23 04/11/25  [provider]  levothyroxine  (SYNTHROID ) 50 MCG tablet Take 50 mcg by mouth daily before breakfast. 02/09/21   [provider]  LORazepam   (ATIVAN ) 1 MG tablet Take 1 tablet (1 mg total) by mouth at bedtime. 01/03/22   Bryn Bernardino NOVAK, MD  meclizine  (ANTIVERT ) 25 MG tablet Take 25 mg by mouth every 8 (eight) hours as needed for dizziness.    [provider]  melatonin 5 MG TABS Take 5 mg by mouth at bedtime.    [provider]  montelukast  (SINGULAIR ) 10 MG tablet Take 10 mg by mouth at bedtime. 02/13/21   [provider]  multivitamin-lutein (OCUVITE-LUTEIN) CAPS capsule Take 1 capsule by mouth daily.    [provider]  nitrofurantoin (MACRODANTIN) 100 MG capsule Take by mouth. 09/14/24   [provider]  pantoprazole  (PROTONIX ) 40 MG tablet Take 40 mg by mouth daily.    [provider]  potassium chloride  (MICRO-K ) 10 MEQ CR capsule Take 10 mEq by mouth daily.    [provider]  PRESCRIPTION MEDICATION CPAP- At bedtime    [provider]  TRELEGY ELLIPTA 100-62.5-25 MCG/ACT AEPB Inhale 1 puff into the lungs daily.    [provider]  verapamil  (CALAN -SR) 120 MG CR tablet Take 360 mg by mouth daily. 02/13/21   [provider]  Vibegron  (GEMTESA ) 75 MG TABS Take by mouth.    [provider]    Physical Exam: Vitals:   10/17/24 1030 10/17/24 1045 10/17/24 1123 10/17/24 1130  BP: 125/69 106/65  123/64  Pulse:    95  Resp: 18 19  17   Temp:   99.8 F (37.7 C)   TempSrc:      SpO2:    91%  Weight:      Height:       Physical Exam Vitals and nursing note reviewed.  Constitutional:      General: She is awake. She is not in acute distress.    Appearance: She is obese. She is ill-appearing.  HENT:     Head: Normocephalic.     Nose: No rhinorrhea.     Mouth/Throat:     Mouth: Mucous membranes are dry.  Eyes:     General: No scleral icterus.    Pupils: Pupils are equal, round, and reactive to light.  Cardiovascular:     Rate and Rhythm: Normal rate and regular rhythm.  Pulmonary:     Effort: Pulmonary effort is normal.      Breath sounds: Normal breath sounds. No wheezing, rhonchi or rales.  Abdominal:     General: Bowel sounds are normal. There is no distension.     Palpations: Abdomen is soft.  Tenderness: There is abdominal tenderness. There is no right CVA tenderness or left CVA tenderness.  Musculoskeletal:     Cervical back: Neck supple.     Right lower leg: No edema.     Left lower leg: No edema.  Skin:    General: Skin is warm and dry.  Neurological:     General: No focal deficit present.     Mental Status: She is alert and oriented to person, place, and time.  Psychiatric:        Mood and Affect: Mood normal.        Behavior: Behavior normal. Behavior is cooperative.     Data Reviewed:  Results are pending, will review when available.  01/02/2022 echocardiogram report. IMPRESSIONS:   1. Left ventricular ejection fraction, by estimation, is 60 to 65%. The  left ventricle has normal function. The left ventricle has no regional  wall motion abnormalities. There is moderate asymmetric left ventricular  hypertrophy of the basal-septal  segment. Left ventricular diastolic parameters are consistent with Grade I  diastolic dysfunction (impaired relaxation).   2. Right ventricular systolic function is normal. The right ventricular  size is normal. There is normal pulmonary artery systolic pressure. The  estimated right ventricular systolic pressure is 24.3 mmHg.   3. The mitral valve is normal in structure. Trivial mitral valve  regurgitation.   4. The aortic valve is tricuspid. Aortic valve regurgitation is not  visualized. Aortic valve sclerosis/calcification is present, without any  evidence of aortic stenosis.   EKG: Vent. rate 90 BPM PR interval 155 ms QRS duration 96 ms QT/QTcB 446/546 ms P-R-T axes 83 66 118 Sinus rhythm Nonspecific T abnrm, anterolateral leads  Prolonged QT interval   Assessment and Plan: Principal Problem:   Sepsis due to undetermined organism (HCC) In  the setting of:   Acute on chronic colitis Admit to PCU/inpatient. Continue IV fluids. Analgesics as needed. Antiemetics as needed. Continue ceftriaxone  2 g IVPB every 24 hours.   Continue metronidazole  500 mg IVPB q 12 hr. Continue vancomycin  125 mg p.o. 4 times daily. -C. difficile colitis prevention. Follow-up blood culture and sensitivity Follow CBC and CMP in a.m.  Active Problems:   AKI (acute kidney injury) Observation/telemetry. Continue IV fluids. Avoid hypotension. Avoid nephrotoxins. Monitor intake and output. Monitor renal function electrolytes.    Hypokalemia Supplemented. Follow-up potassium level.    Hypertension Continue verapamil  360 mg p.o. daily.    Dyslipidemia Continue statin pending med rec.    OSA (obstructive sleep apnea) CPAP at bedtime.    CAD (coronary artery disease) Continue aspirin  and statin.    Advance Care Planning:   Code Status: Full Code   Consults:   Family Communication: Her spouse was at bedside.  Severity of Illness: The appropriate patient status for this patient is INPATIENT. Inpatient status is judged to be reasonable and necessary in order to provide the required intensity of service to ensure the patient's safety. The patient's presenting symptoms, physical exam findings, and initial radiographic and laboratory data in the context of their chronic comorbidities is felt to place them at high risk for further clinical deterioration. Furthermore, it is not anticipated that the patient will be medically stable for discharge from the hospital within 2 midnights of admission.   * I certify that at the point of admission it is my clinical judgment that the patient will require inpatient hospital care spanning beyond 2 midnights from the point of admission due to high intensity of service, high risk  for further deterioration and high frequency of surveillance required.*  Author: Alm Dorn Castor, MD 10/17/2024 1:13 PM  For  on call review [email or web address].   This document was prepared using Dragon voice recognition software and may contain some unintended transcription errors.     [1]  Allergies Allergen Reactions   Tetanus Toxoid-Containing Vaccines Anaphylaxis   Tetanus-Diphtheria Toxoids Td Anaphylaxis   Meloxicam Nausea And Vomiting   Phentermine Nausea And Vomiting   Sulfa Antibiotics Diarrhea and Nausea And Vomiting   Zithromax [Azithromycin] Diarrhea and Nausea And Vomiting   "

## 2024-10-17 NOTE — ED Notes (Signed)
 Patient transported to CT

## 2024-10-17 NOTE — ED Triage Notes (Signed)
 Pt arrives via EMS from [facility] independent living where pt was up using the bathroom and became dizzy and fell. Pt landed on left side beside the toilet. Pt has some bruising to left hand and abrasion to left side of eye. Pt reports diarrhea since last night with abd pain and headache.

## 2024-10-17 NOTE — ED Notes (Signed)
 Delay in blood cultures, xray at bedside

## 2024-10-17 NOTE — ED Notes (Signed)
 Pt assisted onto [facility].

## 2024-10-17 NOTE — ED Notes (Signed)
 Pt wanted to use [facility], assisted pt. When assisting pt back to bed, her legs were very weak and unable to stand on her own. 3 person assist to get pt back in bed.

## 2024-10-18 DIAGNOSIS — A419 Sepsis, unspecified organism: Secondary | ICD-10-CM | POA: Diagnosis not present

## 2024-10-18 LAB — MAGNESIUM: Magnesium: 1.7 mg/dL (ref 1.7–2.4)

## 2024-10-18 LAB — C DIFFICILE QUICK SCREEN W PCR REFLEX
C Diff antigen: NEGATIVE
C Diff interpretation: NOT DETECTED
C Diff toxin: NEGATIVE

## 2024-10-18 LAB — LACTIC ACID, PLASMA: Lactic Acid, Venous: 1.2 mmol/L (ref 0.5–1.9)

## 2024-10-18 MED ORDER — ALBUTEROL SULFATE (2.5 MG/3ML) 0.083% IN NEBU: 2.5000 mg | INHALATION_SOLUTION | RESPIRATORY_TRACT | Status: DC | PRN

## 2024-10-18 MED ORDER — FLUTICASONE PROPIONATE 50 MCG/ACT NA SUSP
1.0000 | Freq: Every day | NASAL | Status: DC
  Administered 2024-10-18 – 2024-10-25 (×7): 1 via NASAL
  Filled 2024-10-18: qty 16

## 2024-10-18 MED ORDER — BENZONATATE 100 MG PO CAPS
200.0000 mg | ORAL_CAPSULE | Freq: Three times a day (TID) | ORAL | Status: DC | PRN
  Administered 2024-10-18 – 2024-10-25 (×4): 200 mg via ORAL
  Filled 2024-10-18 (×5): qty 2

## 2024-10-18 MED ORDER — BUPROPION HCL ER (SR) 150 MG PO TB12
150.0000 mg | ORAL_TABLET | Freq: Every day | ORAL | Status: DC
  Administered 2024-10-18 – 2024-10-26 (×9): 150 mg via ORAL
  Filled 2024-10-18 (×9): qty 1

## 2024-10-18 MED ORDER — GABAPENTIN 100 MG PO CAPS
100.0000 mg | ORAL_CAPSULE | Freq: Two times a day (BID) | ORAL | Status: DC
  Administered 2024-10-18 – 2024-10-26 (×17): 100 mg via ORAL
  Filled 2024-10-18 (×17): qty 1

## 2024-10-18 MED ORDER — MELATONIN 5 MG PO TABS
5.0000 mg | ORAL_TABLET | Freq: Every day | ORAL | Status: DC
  Administered 2024-10-18 – 2024-10-25 (×8): 5 mg via ORAL
  Filled 2024-10-18 (×8): qty 1

## 2024-10-18 MED ORDER — DICLOFENAC SODIUM 1 % EX GEL: 2.0000 g | Freq: Four times a day (QID) | CUTANEOUS | Status: DC | PRN

## 2024-10-18 MED ORDER — LORAZEPAM 1 MG PO TABS
1.0000 mg | ORAL_TABLET | Freq: Every day | ORAL | Status: DC
  Administered 2024-10-18 – 2024-10-25 (×8): 1 mg via ORAL
  Filled 2024-10-18 (×8): qty 1

## 2024-10-18 MED ORDER — ESCITALOPRAM OXALATE 20 MG PO TABS
20.0000 mg | ORAL_TABLET | Freq: Every day | ORAL | Status: DC
  Administered 2024-10-18 – 2024-10-26 (×9): 20 mg via ORAL
  Filled 2024-10-18 (×9): qty 1

## 2024-10-18 MED ORDER — BUDESON-GLYCOPYRROL-FORMOTEROL 160-9-4.8 MCG/ACT IN AERO
2.0000 | INHALATION_SPRAY | Freq: Two times a day (BID) | RESPIRATORY_TRACT | Status: DC
  Administered 2024-10-18 – 2024-10-26 (×16): 2 via RESPIRATORY_TRACT
  Filled 2024-10-18 (×2): qty 5.9

## 2024-10-18 NOTE — Progress Notes (Incomplete)
 " Progress Note   Patient: Courtney [name] FMW:[id number] DOB: July 08, 1946 DOA: 10/17/2024     1 DOS: the patient was seen and examined on 10/18/2024   Brief hospital course:  ALLIA WILTSEY is a 78 y.o. female with medical history significant of anemia, hyperlipidemia, aortic atherosclerosis, aortic calcification, osteoarthritis of the wrist, asthma, bipolar disorder, depression, hiatal hernia, nephrolithiasis, hypertension, hypothyroidism, mitral valve prolapse, OSA on CPAP, pulmonary embolism, history of CVA with residual right-sided hemiparesis, thrombophlebitis, vertigo, multiple episodes of C. difficile colitis, diverticulosis, diverticulitis who started having diarrhea multiple episodes yesterday and is coming to the emergency department after having a fall at her ALF. ... See H&P for full HPI on admission & ED course.  ED course was most notable for significant leukocytosis WBC 24, negative viral PCRs, lactic acid 3.1.  CT of abdomen and pelvis showed acute colitis affecting the transverse colon and splenic flexure.  UA was also concerning for infection, urine culture has been added on.  Patient was admitted to the hospital on afternoon of 10/17/2024 for further evaluation and management of sepsis with acute on chronic colitis, AKI, hypokalemia as outlined in detail below.   Assessment and Plan:  Sepsis due to undetermined organism  In the setting of: Acute on chronic colitis and  UTI --Continue current antibiotics started on admission: Rocephin , Flagyl , empiric p.o. vancomycin  apparently as recommended by her primary GI for prevention of recurrent C. difficile --Continue IV fluids. --Analgesics as needed. --Antiemetics as needed. --Check C. difficile and GI panel --Enteric precautions until results back and negative --Follow-up blood and urine cultures --Follow CBC and CMP in a.m. --Monitor closely   AKI (acute kidney injury) Creatinine has improved IV hydration from  1.41-1.20 --Continue IV fluids. --Avoid hypotension. --Avoid nephrotoxins. --Monitor intake and output. --Monitor renal function electrolytes.     Hypokalemia -resolved with replacement --Monitor BMP, replace K as needed  --Check Mg level    Hypertension Continue verapamil  360 mg p.o. daily.     Dyslipidemia Continue statin pending med rec.     OSA (obstructive sleep apnea) CPAP at bedtime.     CAD (coronary artery disease) Continue aspirin  and statin.    {Tip this will not be part of the note when signed Body mass index is 35.67 kg/m. , ,  (Optional):26781}  Subjective: Patient seen in the ED holding for a bed this morning.  She reports nausea without vomiting, also with ongoing diarrhea.  No cough congestion or other respiratory complaints.  She does report urinary incontinence which has been new for the past couple months.  Patient requesting Tessalon  Perles which she takes 3 times daily at home  Physical Exam: Vitals:   10/18/24 1100 10/18/24 1115 10/18/24 1130 10/18/24 1225  BP:   130/72   Pulse: 79 (!) 47 82   Resp:   12   Temp:    99.7 F (37.6 C)  TempSrc:    Oral  SpO2: 99% 90% 95%   Weight:      Height:       *** Data Reviewed: {Tip this will not be part of the note when signed- Document your independent interpretation of telemetry tracing, EKG, lab, Radiology test or any other diagnostic tests. Add any new diagnostic test ordered today. (Optional):26781} {Results:26384}  Family Communication: ***  Disposition: Status is: Inpatient {Inpatient:23812}  Planned Discharge Destination: {DISCHARGE DESTINATION_TRH:27031} {Tip this will not be part of the note when signed  DVT Prophylaxis  ., Enoxaparin  (lovenox ) injection 40 mg  (Optional):26781}  Time spent: *** minutes  Author: Burnard DELENA Cunning, DO 10/18/2024 1:51 PM  For on call review [email or web address].  "

## 2024-10-18 NOTE — Progress Notes (Signed)
" °   10/18/24 0046  BiPAP/CPAP/SIPAP  $ Non-Invasive Home Ventilator  Initial  BiPAP/CPAP/SIPAP Pt Type Adult  BiPAP/CPAP/SIPAP DREAMSTATIOND  Mask Type Nasal mask  Dentures removed? Not applicable  Flow Rate 2 lpm  Patient Home Machine No  Patient Home Mask No  Patient Home Tubing No  Auto Titrate Yes  Minimum cmH2O 5 cmH2O  Maximum cmH2O 20 cmH2O  BiPAP/CPAP /SiPAP Vitals  Pulse Rate 83  Resp 17  SpO2 94 %  MEWS Score/Color  MEWS Score 0  MEWS Score Color Green    "

## 2024-10-18 NOTE — Progress Notes (Signed)
" °   10/18/24 2256  BiPAP/CPAP/SIPAP  BiPAP/CPAP/SIPAP Pt Type Adult  BiPAP/CPAP/SIPAP DREAMSTATIOND  Mask Type Nasal mask  Dentures removed? Not applicable  Mask Size Medium  Flow Rate 2 lpm  Patient Home Machine No  Patient Home Mask No  Patient Home Tubing No  Auto Titrate Yes  Minimum cmH2O 5 cmH2O  Maximum cmH2O 20 cmH2O  Device Plugged into RED Power Outlet Yes    "

## 2024-10-19 ENCOUNTER — Inpatient Hospital Stay (HOSPITAL_COMMUNITY)

## 2024-10-19 DIAGNOSIS — A419 Sepsis, unspecified organism: Secondary | ICD-10-CM | POA: Diagnosis not present

## 2024-10-19 LAB — CBC
HCT: 35.7 % — ABNORMAL LOW (ref 36.0–46.0)
Hemoglobin: 11.2 g/dL — ABNORMAL LOW (ref 12.0–15.0)
MCH: 30.2 pg (ref 26.0–34.0)
MCHC: 31.4 g/dL (ref 30.0–36.0)
MCV: 96.2 fL (ref 80.0–100.0)
Platelets: 157 K/uL (ref 150–400)
RBC: 3.71 MIL/uL — ABNORMAL LOW (ref 3.87–5.11)
RDW: 13.2 % (ref 11.5–15.5)
WBC: 13 K/uL — ABNORMAL HIGH (ref 4.0–10.5)
nRBC: 0 % (ref 0.0–0.2)

## 2024-10-19 LAB — BASIC METABOLIC PANEL WITH GFR
Anion gap: 8 (ref 5–15)
BUN: 11 mg/dL (ref 8–23)
CO2: 28 mmol/L (ref 22–32)
Calcium: 9.2 mg/dL (ref 8.9–10.3)
Chloride: 101 mmol/L (ref 98–111)
Creatinine, Ser: 0.67 mg/dL (ref 0.44–1.00)
GFR, Estimated: >60 mL/min
Glucose, Bld: 82 mg/dL (ref 70–99)
Potassium: 3.8 mmol/L (ref 3.5–5.1)
Sodium: 137 mmol/L (ref 135–145)

## 2024-10-19 MED ORDER — SODIUM CHLORIDE 0.9 % IV SOLN
2.0000 g | Freq: Three times a day (TID) | INTRAVENOUS | Status: DC
  Administered 2024-10-19 – 2024-10-20 (×4): 2 g via INTRAVENOUS
  Filled 2024-10-19 (×4): qty 12.5

## 2024-10-19 NOTE — Progress Notes (Addendum)
 " Progress Note   Patient: Courtney [name] FMW:[id number] DOB: 01-Jun-1946 DOA: 10/17/2024     2 DOS: the patient was seen and examined on 10/19/2024   Brief hospital course:    Courtney [name] is a 78 y.o. female with medical history significant of anemia, hyperlipidemia, aortic atherosclerosis, aortic calcification, osteoarthritis of the wrist, asthma, bipolar disorder, depression, hiatal hernia, nephrolithiasis, hypertension, hypothyroidism, mitral valve prolapse, OSA on CPAP, pulmonary embolism, history of CVA with residual right-sided hemiparesis, thrombophlebitis, vertigo, multiple episodes of C. difficile colitis, diverticulosis, diverticulitis who started having diarrhea multiple episodes yesterday and is coming to the emergency department after having a fall at her ALF. ... See H&P for full HPI on admission & ED course.  ED course was most notable for significant leukocytosis WBC 24, negative viral PCRs, lactic acid 3.1.  CT of abdomen and pelvis showed acute colitis affecting the transverse colon and splenic flexure.  UA was also concerning for infection, urine culture has been added on.  Patient was admitted to the hospital on afternoon of 10/17/2024 for further evaluation and management of sepsis with acute on chronic colitis, AKI, hypokalemia as outlined in detail below.   Assessment and Plan:  Sepsis due to undetermined organism  In the setting of: Acute on chronic colitis and  UTI -- Initial antibiotics started on admission: Rocephin , Flagyl , empiric p.o. vancomycin  apparently as recommended by her primary GI for prevention of recurrent C. Difficile --Urine culture growing Pseudomonas --Change Rocephin  to cefepime  --Continue Flagyl  --Off IV fluids. --Advance to soft diet --Analgesics as needed. --Antiemetics as needed. -- Pending GI panel results --C. difficile negative --Enteric precautions until GIP results --Follow blood culture --Follow urine culture  susceptibilities --Follow CBC and CMP in a.m. --Monitor closely    AKI (acute kidney injury) -resolved Treated with IV fluids 1.41>>1.20>> 0.67 --Avoid hypotension. --Avoid nephrotoxins. --Monitor intake and output. --Monitor renal function electrolytes.  Right foot pain -patient suffered a fall prior to admission.  Reports persistent right foot pain not previously imaged.  Does have exquisite tenderness over the dorsal midfoot. X-ray obtained today, results this evening showing prior surgical changes with 1 screw confirmed a broken and a second screw suspected. --Nonweightbearing on right foot for now --Consult Ortho tomorrow a.m.     Hypokalemia -resolved with replacement --Monitor BMP, replace K as needed  -- Mg normal 1.7    Hypertension Continue verapamil  360 mg p.o. daily.     Dyslipidemia Continue statin pending med rec.     OSA (obstructive sleep apnea) CPAP at bedtime.     CAD (coronary artery disease) Continue aspirin  and statin.      Subjective: Patient seen awake resting in bed this morning.  She reports being hungry and requesting diet to be advanced today.  Denies nausea vomiting or diarrhea.  She does endorse significant right foot pain and has exquisite tenderness on even light palpation over the top of the foot.  She reports probably injuring her foot and her fall.  She reports some shortness of breath on and off.  No fevers chills.  Physical Exam: Vitals:   10/19/24 0012 10/19/24 0448 10/19/24 0859 10/19/24 1420  BP: 123/63 (!) 154/76  129/63  Pulse: 70 74  65  Resp: 16 18 (!) 28 18  Temp: 98.6 F (37 C) 98.5 F (36.9 C)  98.4 F (36.9 C)  TempSrc:    Oral  SpO2: 95% 91%  96%  Weight:      Height:  General exam: awake, alert, no acute distress HEENT: moist mucus membranes, hearing grossly normal, wearing glasses Respiratory system: Lungs overall clear, diminished bases, normal respiratory effort at rest.  On 2 L/min [place] O2 Cardiovascular  system: normal S1/S2, RRR, trace edema.   Gastrointestinal system: soft, NT, ND, bowel sounds present Central nervous system: A&O x3. no gross focal neurologic deficits, normal speech Skin: dry, intact, normal temperature Psychiatry: normal mood, congruent affect, judgement and insight appear normal   Data Reviewed:  Notable labs:  BMP normal. AKI resolved creatinine 1.20--0.67 WBC improved 26.3--13.0 Hemoglobin stable 11.8--11.2  C. difficile negative  Urine culture growing Pseudomonas aeruginosa  Blood cultures no growth at 2 days  X-ray right foot today shows postsurgical changes of the right proximal metatarsal, 1 screw is broken and other screw is suspected also broken and displaced with surrounding lucency.  Pending urine culture sensitivities, C. difficile and GI panel   Family Communication: None present at bedside  Disposition: Status is: Inpatient Remains inpatient appropriate because: Remains on IV antibiotics, fluids and pending culture results, needs further clinical improvement.  X-ray pending for evaluation of right foot pain   Planned Discharge Destination: Home    Time spent: 45 minutes  Author: Burnard DELENA Cunning, DO 10/19/2024 6:53 PM  For on call review [email or web address].  "

## 2024-10-19 NOTE — Progress Notes (Signed)
" °   10/19/24 2309  BiPAP/CPAP/SIPAP  BiPAP/CPAP/SIPAP Pt Type Adult  BiPAP/CPAP/SIPAP DREAMSTATIOND  Mask Type Nasal mask  Dentures removed? Not applicable  Mask Size Medium  Flow Rate 2 lpm  Patient Home Machine No  Patient Home Mask No  Patient Home Tubing No  Auto Titrate Yes  Minimum cmH2O 5 cmH2O  Maximum cmH2O 20 cmH2O  CPAP/SIPAP surface wiped down Yes  Device Plugged into RED Power Outlet Yes  BiPAP/CPAP /SiPAP Vitals  Resp (!) 23  MEWS Score/Color  MEWS Score 1  MEWS Score Color Green    "

## 2024-10-20 ENCOUNTER — Inpatient Hospital Stay (HOSPITAL_COMMUNITY)

## 2024-10-20 DIAGNOSIS — E785 Hyperlipidemia, unspecified: Secondary | ICD-10-CM | POA: Diagnosis not present

## 2024-10-20 DIAGNOSIS — I10 Essential (primary) hypertension: Secondary | ICD-10-CM | POA: Diagnosis not present

## 2024-10-20 DIAGNOSIS — G4733 Obstructive sleep apnea (adult) (pediatric): Secondary | ICD-10-CM

## 2024-10-20 DIAGNOSIS — A041 Enterotoxigenic Escherichia coli infection: Secondary | ICD-10-CM

## 2024-10-20 DIAGNOSIS — E876 Hypokalemia: Secondary | ICD-10-CM | POA: Diagnosis not present

## 2024-10-20 DIAGNOSIS — E039 Hypothyroidism, unspecified: Secondary | ICD-10-CM | POA: Diagnosis not present

## 2024-10-20 DIAGNOSIS — K529 Noninfective gastroenteritis and colitis, unspecified: Secondary | ICD-10-CM

## 2024-10-20 DIAGNOSIS — T84213A Breakdown (mechanical) of internal fixation device of bones of foot and toes, initial encounter: Secondary | ICD-10-CM | POA: Diagnosis not present

## 2024-10-20 DIAGNOSIS — N179 Acute kidney failure, unspecified: Secondary | ICD-10-CM | POA: Diagnosis not present

## 2024-10-20 DIAGNOSIS — I2510 Atherosclerotic heart disease of native coronary artery without angina pectoris: Secondary | ICD-10-CM

## 2024-10-20 DIAGNOSIS — N390 Urinary tract infection, site not specified: Secondary | ICD-10-CM | POA: Diagnosis not present

## 2024-10-20 DIAGNOSIS — Z1152 Encounter for screening for COVID-19: Secondary | ICD-10-CM | POA: Diagnosis not present

## 2024-10-20 DIAGNOSIS — I2583 Coronary atherosclerosis due to lipid rich plaque: Secondary | ICD-10-CM | POA: Diagnosis not present

## 2024-10-20 DIAGNOSIS — B965 Pseudomonas (aeruginosa) (mallei) (pseudomallei) as the cause of diseases classified elsewhere: Secondary | ICD-10-CM | POA: Diagnosis not present

## 2024-10-20 DIAGNOSIS — F319 Bipolar disorder, unspecified: Secondary | ICD-10-CM | POA: Diagnosis not present

## 2024-10-20 DIAGNOSIS — I69351 Hemiplegia and hemiparesis following cerebral infarction affecting right dominant side: Secondary | ICD-10-CM | POA: Diagnosis not present

## 2024-10-20 DIAGNOSIS — A419 Sepsis, unspecified organism: Secondary | ICD-10-CM | POA: Diagnosis not present

## 2024-10-20 DIAGNOSIS — I5033 Acute on chronic diastolic (congestive) heart failure: Secondary | ICD-10-CM | POA: Diagnosis not present

## 2024-10-20 DIAGNOSIS — I110 Hypertensive heart disease with heart failure: Secondary | ICD-10-CM | POA: Diagnosis not present

## 2024-10-20 LAB — BASIC METABOLIC PANEL WITH GFR
Anion gap: 6 (ref 5–15)
BUN: 7 mg/dL — ABNORMAL LOW (ref 8–23)
CO2: 29 mmol/L (ref 22–32)
Calcium: 8.9 mg/dL (ref 8.9–10.3)
Chloride: 103 mmol/L (ref 98–111)
Creatinine, Ser: 0.6 mg/dL (ref 0.44–1.00)
GFR, Estimated: >60 mL/min
Glucose, Bld: 93 mg/dL (ref 70–99)
Potassium: 3.4 mmol/L — ABNORMAL LOW (ref 3.5–5.1)
Sodium: 138 mmol/L (ref 135–145)

## 2024-10-20 LAB — GASTROINTESTINAL PANEL BY PCR, STOOL (REPLACES STOOL CULTURE)

## 2024-10-20 LAB — CBC
HCT: 32.6 % — ABNORMAL LOW (ref 36.0–46.0)
Hemoglobin: 10.2 g/dL — ABNORMAL LOW (ref 12.0–15.0)
MCH: 29.7 pg (ref 26.0–34.0)
MCHC: 31.3 g/dL (ref 30.0–36.0)
MCV: 95 fL (ref 80.0–100.0)
Platelets: 163 K/uL (ref 150–400)
RBC: 3.43 MIL/uL — ABNORMAL LOW (ref 3.87–5.11)
RDW: 12.9 % (ref 11.5–15.5)
WBC: 10.4 K/uL (ref 4.0–10.5)
nRBC: 0 % (ref 0.0–0.2)

## 2024-10-20 LAB — MAGNESIUM: Magnesium: 1.8 mg/dL (ref 1.7–2.4)

## 2024-10-20 MED ORDER — MAGNESIUM SULFATE 2 GM/50ML IV SOLN
2.0000 g | Freq: Once | INTRAVENOUS | Status: AC
  Administered 2024-10-20: 2 g via INTRAVENOUS
  Filled 2024-10-20: qty 50

## 2024-10-20 MED ORDER — IPRATROPIUM-ALBUTEROL 0.5-2.5 (3) MG/3ML IN SOLN
3.0000 mL | Freq: Once | RESPIRATORY_TRACT | Status: AC
  Administered 2024-10-20: 3 mL via RESPIRATORY_TRACT
  Filled 2024-10-20: qty 3

## 2024-10-20 MED ORDER — ASPIRIN 325 MG PO TBEC
325.0000 mg | DELAYED_RELEASE_TABLET | Freq: Every day | ORAL | Status: DC
  Administered 2024-10-20 – 2024-10-26 (×7): 325 mg via ORAL
  Filled 2024-10-20 (×7): qty 1

## 2024-10-20 MED ORDER — PHENOL 1.4 % MT LIQD
1.0000 | OROMUCOSAL | Status: DC | PRN
  Administered 2024-10-20: 1 via OROMUCOSAL
  Filled 2024-10-20: qty 177

## 2024-10-20 MED ORDER — POTASSIUM CHLORIDE CRYS ER 10 MEQ PO TBCR
40.0000 meq | EXTENDED_RELEASE_TABLET | Freq: Once | ORAL | Status: AC
  Administered 2024-10-20: 40 meq via ORAL
  Filled 2024-10-20: qty 4

## 2024-10-20 MED ORDER — IPRATROPIUM-ALBUTEROL 0.5-2.5 (3) MG/3ML IN SOLN: 3.0000 mL | Freq: Two times a day (BID) | RESPIRATORY_TRACT | Status: DC

## 2024-10-20 MED ORDER — CIPROFLOXACIN HCL 500 MG PO TABS
500.0000 mg | ORAL_TABLET | Freq: Two times a day (BID) | ORAL | Status: DC
  Administered 2024-10-20: 500 mg via ORAL
  Filled 2024-10-20: qty 1

## 2024-10-20 NOTE — Progress Notes (Signed)
 12-lead-EKG done. Placed copy in patient's shadow chart. EKG did not cross over the Results Review.

## 2024-10-20 NOTE — Consult Note (Signed)
 "   Patient ID: Courtney [name] MRN: [id number] DOB/AGE: 76-Jan-1947 76 y.o.  Admit date: 10/17/2024  Admission Diagnoses:  Principal Problem:   Sepsis due to undetermined organism [facility]) Active Problems:   Hypertension   Dyslipidemia   OSA (obstructive sleep apnea)   CAD (coronary artery disease)   AKI (acute kidney injury)   Hypokalemia   Acute on chronic colitis   HPI: Ortho consult for right foot contusion. Pt fell at assisted living facility on 10/17/24. Admitted for pain control and diarrhea to hospitalist service.  PMH notable for anemia, hyperlipidemia, aortic atherosclerosis, aortic calcification, osteoarthritis of the wrist, asthma, bipolar disorder, depression, hiatal hernia, nephrolithiasis, hypertension, hypothyroidism, mitral valve prolapse, OSA on CPAP, pulmonary embolism, history of CVA with residual right-sided hemiparesis, thrombophlebitis, vertigo, multiple episodes of C. difficile colitis, diverticulosis, diverticulitis.  Denies numbness/tingling.  Past Medical History: Past Medical History:  Diagnosis Date   Anemia    Aortic atherosclerosis    Arthritis    Bilateral Wrists   Asthma    Bipolar disorder (HCC)    Coronary artery calcification    Depression    Diverticulitis    Headache    Hiatal hernia    History of kidney stones 2006   Hypertension    Hypothyroidism (acquired) 04/02/2021   Kidney stone    Mitral valve prolapse    OSA (obstructive sleep apnea) 04/02/2021   wears CPAP   Panic attack    PONV (postoperative nausea and vomiting)    Pulmonary embolus (HCC)    Stroke (HCC)    Thrombophlebitis    Vertigo     Surgical History: Past Surgical History:  Procedure Laterality Date   ABDOMINAL HYSTERECTOMY     ACHILLES TENDON SURGERY Right    ANTERIOR CERVICAL DECOMP/DISCECTOMY FUSION N/A 03/28/2021   Procedure: Cervical three-four Cervical four-five Cervical five-six Anterior cervical decompression/discectomy/fusion/interbody  prosthesis/plate/screws;  Surgeon: Mavis Purchase, MD;  Location: [facility] OR;  Service: Neurosurgery;  Laterality: N/A;   BACK SURGERY     BREAST REDUCTION SURGERY     BUNIONECTOMY Right    CARPAL TUNNEL RELEASE Right 06/04/2023   Procedure: CARPAL TUNNEL RELEASE;  Surgeon: Mavis Purchase, MD;  Location: [facility] OR;  Service: Neurosurgery;  Laterality: Right;   CHOLECYSTECTOMY     EYE SURGERY Bilateral 2000   Cataract   JOINT REPLACEMENT Bilateral    Knee replacement   TOE SURGERY Right    hammer toe surgery   TONSILLECTOMY     ULNAR NERVE TRANSPOSITION Right 06/04/2023   Procedure: ULNAR NERVE DECOMPRESSION/TRANSPOSITION;  Surgeon: Mavis Purchase, MD;  Location: [facility] OR;  Service: Neurosurgery;  Laterality: Right;    Family History: Family History  Problem Relation Age of Onset   Stroke Father    Hyperlipidemia Father    Esophageal cancer Maternal Uncle    Breast cancer Paternal Aunt        71s   CAD Neg Hx     Social History: Social History   Socioeconomic History   Marital status: Married    Spouse name: Not on file   Number of children: 2   Years of education: Not on file   Highest education level: Not on file  Occupational History   Not on file  Tobacco Use   Smoking status: Never   Smokeless tobacco: Never  Vaping Use   Vaping status: Never Used  Substance and Sexual Activity   Alcohol use: Yes    Comment: Very rare- maybe once a year   Drug use:  Not Currently   Sexual activity: Not Currently  Other Topics Concern   Not on file  Social History Narrative   Not on file   Social Drivers of Health   Tobacco Use: Low Risk (10/17/2024)   Patient History    Smoking Tobacco Use: Never    Smokeless Tobacco Use: Never    Passive Exposure: Not on file  Financial Resource Strain: Not on file  Food Insecurity: No Food Insecurity (10/19/2024)   Epic    Worried About Programme Researcher, Broadcasting/film/video in the Last Year: Never true    Ran Out of Food in the Last Year: Never true   Transportation Needs: No Transportation Needs (10/19/2024)   Epic    Lack of Transportation (Medical): No    Lack of Transportation (Non-Medical): No  Physical Activity: Not on file  Stress: Not on file  Social Connections: Moderately Integrated (10/19/2024)   Social Connection and Isolation Panel    Frequency of Communication with Friends and Family: Three times a week    Frequency of Social Gatherings with Friends and Family: Twice a week    Attends Religious Services: 1 to 4 times per year    Active Member of [employer] or Organizations: No    Attends Banker Meetings: Never    Marital Status: Married  Catering Manager Violence: Not At Risk (10/19/2024)   Epic    Fear of Current or Ex-Partner: No    Emotionally Abused: No    Physically Abused: No    Sexually Abused: No  Depression (PHQ2-9): Not on file  Alcohol Screen: Not on file  Housing: Low Risk (10/19/2024)   Epic    Unable to Pay for Housing in the Last Year: No    Number of Times Moved in the Last Year: 0    Homeless in the Last Year: No  Utilities: Not At Risk (10/19/2024)   Epic    Threatened with loss of utilities: No  Health Literacy: Not on file    Allergies: Tetanus toxoid-containing vaccines, Tetanus-diphtheria toxoids td, Meloxicam, Phentermine, Sulfa antibiotics, and Zithromax [azithromycin]  Medications: I have reviewed the patient's current medications.  Vital Signs: Patient Vitals for the past 24 hrs:  BP Temp Temp src Pulse Resp SpO2  10/20/24 1219 (!) 153/76 98.3 F (36.8 C) Oral 80 20 98 %  10/20/24 0539 (!) 143/62 97.9 F (36.6 C) Oral 75 20 95 %  10/20/24 0200 -- -- -- -- 18 --  10/19/24 2309 -- -- -- -- (!) 23 --  10/19/24 2136 (!) 146/75 98.9 F (37.2 C) Oral 69 18 97 %    Radiology: CT FOOT RIGHT WO CONTRAST Result Date: 10/20/2024 CLINICAL DATA:  Foot pain.  Recent fall. EXAM: CT OF THE RIGHT FOOT WITHOUT CONTRAST TECHNIQUE: Multidetector CT imaging of the right foot was  performed according to the standard protocol. Multiplanar CT image reconstructions were also generated. RADIATION DOSE REDUCTION: This exam was performed according to the departmental dose-optimization program which includes automated exposure control, adjustment of the mA and/or kV according to patient size and/or use of iterative reconstruction technique. COMPARISON:  Right foot radiographs dated 10/19/2024. FINDINGS: Bones/Joint/Cartilage Diffuse osseous demineralization. Postsurgical changes at the base of the first metatarsal with 2 fractured cannulated partially threaded screws, one of which demonstrates up to 5 mm of displacement (series 5, images 101-103). No appreciable acute fracture involving the visualized bones. No dislocation. Well corticated ossicle at the dorsal aspect of the Lisfranc joint. Mild-to-moderate osteoarthritis throughout the hindfoot  and midfoot, most pronounced at the calcaneocuboid, talonavicular, first and second TMT joints. Hallux valgus with mild-to-moderate degenerative changes of the first MTP joint. Moderate degenerative changes at the first metatarsal head-hallux sesamoid articulation. Plantar calcaneal spur. Ligaments Ligaments are suboptimally evaluated by CT. Muscles and Tendons Thickening of the distal Achilles tendon, which may reflect tendinosis. Foci of calcification within the distal Achilles tendon may relate to prior trauma. Generalized atrophy of the musculature. Soft tissue No fluid collection or hematoma. IMPRESSION: 1. Postsurgical changes at the base of the first metatarsal with 2 fractured screws, one of which demonstrates up to 5 mm of displacement. 2. No appreciable acute fracture involving the visualized bones. No dislocation. 3. Hallux valgus with mild-to-moderate degenerative changes of the first MTP joint. 4. Moderate degenerative changes at the first metatarsal head-hallux sesamoid articulation. 5. Mild-to-moderate osteoarthritis throughout the hindfoot  and midfoot. Well corticated ossicle at the dorsal aspect of the Lisfranc joint. 6. Thickening of the distal Achilles tendon may reflect tendinosis. Foci of calcification within the distal Achilles tendon may relate to prior trauma. Electronically Signed   By: Harrietta Sherry M.D.   On: 10/20/2024 14:10   DG Foot 2 Views Right Result Date: 10/19/2024 CLINICAL DATA:  Right foot pain. EXAM: RIGHT FOOT - 2 VIEW COMPARISON:  None Available. FINDINGS: Postsurgical change in the right proximal metatarsal. One of the screws is broken. The other screw is also likely broken and displaced with surrounding lucency. Hallux valgus with degenerative change of the first metatarsal phalangeal joint. Mild hammertoe deformity of the toes. Moderate midfoot osteoarthritis. Moderate plantar calcaneal spur. No evidence of acute fracture. Mild generalized soft tissue edema. IMPRESSION: 1. Postsurgical change in the right proximal metatarsal. One of the screws is broken. The other screw is also likely broken and displaced with surrounding lucency. 2. Hallux valgus with degenerative change of the first metatarsophalangeal joint. 3. Moderate midfoot osteoarthritis. Electronically Signed   By: Andrea Gasman M.D.   On: 10/19/2024 16:40   CT ABDOMEN PELVIS W CONTRAST Result Date: 10/17/2024 EXAM: CT ABDOMEN AND PELVIS WITH CONTRAST 10/17/2024 09:18:11 AM TECHNIQUE: CT of the abdomen and pelvis was performed with the administration of 80 mL of iohexol  (OMNIPAQUE ) 300 MG/ML solution. Multiplanar reformatted images are provided for review. Automated exposure control, iterative reconstruction, and/or weight-based adjustment of the mA/kV was utilized to reduce the radiation dose to as low as reasonably achievable. COMPARISON: CT abdomen and pelvis 09/02/2024. CLINICAL HISTORY: 78 year old female with dizziness, fall in bathroom, abdominal pain, and diarrhea. FINDINGS: LOWER CHEST: Chronic elevation of the right hemidiaphragm is stable.  Calcified coronary artery atherosclerosis. Coarsely calcified benign appearing right breast mass on series 2 image 5 (such as chronic fat necrosis), appears stable from previous CT abdomen and pelvis in June this year. No pericardial effusion. Stable lung base atelectasis. LIVER: The liver is unremarkable. GALLBLADDER AND BILE DUCTS: Chronic cholecystectomy. No biliary ductal dilatation. SPLEEN: No acute abnormality. PANCREAS: Mostly atrophied pancreas. ADRENAL GLANDS: No acute abnormality. KIDNEYS, URETERS AND BLADDER: No stones in the kidneys or ureters. No hydronephrosis. No perinephric or periureteral stranding. Urinary bladder is unremarkable. Absent renal contrast excretion, or little to no renal contrast excretion on the delayed images, raising the possibility of acute intrinsic renal insufficiency. GI AND BOWEL: Retained fluid in the stomach. Chronically redundant large bowel with severe diverticulosis throughout the descending and sigmoid colon. There is circumferential wall thickening and active inflammation affecting the transverse colon and splenic flexure diffusely (coronal image 54 and series 2 image  28) with mild regional mesenteric inflammation. The wall thickening and inflammation abates in the sigmoid colon. The hepatic flexure and right colon are relatively spared. Decompressed small bowel loops with flocculated material in the terminal ileum. Appendix not well delineated but no evidence of acute inflammation at the cecum. There is no bowel obstruction. A small fat-containing umbilical hernia is stable. PERITONEUM AND RETROPERITONEUM: No ascites. No free air. VASCULATURE: Large arterial and portal venous structures in the abdomen and pelvis appear patent. Calcified aortic atherosclerosis with mild tortuosity. LYMPH NODES: No lymphadenopathy. REPRODUCTIVE ORGANS: Chronically absent uterus, diminutive or absent ovaries. BONES AND SOFT TISSUES: Chronic right flank generator device with no attached  electrical lead identified now. Chronic lumbar fusion changes and severe superimposed lower thoracic and lumbar spine degeneration. Associated moderate dextroconvex scoliosis and widespread vacuum disc. No acute osseous abnormality. IMPRESSION: 1. Long segment acute colitis, most affecting the transverse colon and splenic flexure. No bowel obstruction, perforation, or abscess. Underlying highly redundant large bowel with extensive retained stool. 2. Little to no renal contrast excretion, raising the possibility of acute renal insufficiency. 3. No acute traumatic injury identified. Electronically signed by: Helayne Hurst MD 10/17/2024 09:37 AM EST RP Workstation: [id number]   CT Cervical Spine Wo Contrast Result Date: 10/17/2024 EXAM: CT CERVICAL SPINE WITHOUT CONTRAST 10/17/2024 09:18:11 AM TECHNIQUE: CT of the cervical spine was performed without the administration of intravenous contrast. Multiplanar reformatted images are provided for review. Automated exposure control, iterative reconstruction, and/or weight based adjustment of the mA/kV was utilized to reduce the radiation dose to as low as reasonably achievable. COMPARISON: CT head reported separately today and cervical spine CT 06/25/2024. CLINICAL HISTORY: Patient is a 78 year old female. Neck trauma, dizziness, fall in bathroom, left orbital abrasion. FINDINGS: BONES AND ALIGNMENT: Stable cervical vertebral alignment. No acute fracture or traumatic malalignment. Chronic C3-C6 ACDF appears stable. DEGENERATIVE CHANGES: Chronic C3-C6 ACDF appears stable. Chronic facet arthropathy at the C2-C3 adjacent segment. Advanced chronic disc and facet degeneration at the C6-C7 adjacent segment, including vacuum disc. SOFT TISSUES: Bulky chronic right cervical carotid calcified atherosclerosis. Stable non-contrast visible neck soft tissues. Stable visible non-contrast thoracic inlet. LIMITATIONS/ARTIFACTS: Mild motion artifact. IMPRESSION: 1. Mildly motion degraded,  no acute traumatic injury identified in the cervical spine. 2. Chronic C3-C6 ACDF, with advanced adjacent segment degeneration. Electronically signed by: Helayne Hurst MD 10/17/2024 09:29 AM EST RP Workstation: [id number]   CT Head Wo Contrast Result Date: 10/17/2024 EXAM: CT HEAD WITHOUT CONTRAST 10/17/2024 09:18:11 AM TECHNIQUE: CT of the head was performed without the administration of intravenous contrast. Automated exposure control, iterative reconstruction, and/or weight-based adjustment of the mA/kV was utilized to reduce the radiation dose to as low as reasonably achievable. COMPARISON: CT cervical spine reported separately today. CT head 06/25/2024. CLINICAL HISTORY: 78 year old female with minor head trauma, dizziness, fall in bathroom, and left orbital abrasion. FINDINGS: BRAIN AND VENTRICLES: No acute hemorrhage. No evidence of acute infarct. No hydrocephalus. No extra-axial collection. No mass effect or midline shift. Stable cerebral volume. Chronically advanced bilateral cerebral white matter hypodensity and chronic lacunar infarct of the right corona radiata and medial lentiform. Stable gray white differentiation. Hyperdensity. ORBITS: Mild soft tissue swelling and stranding lateral to the left orbit, overlying the left zygoma (series 506 image 3) which appears to remain intact. No soft tissue gas. Orbital soft tissues otherwise stable. SINUSES: Visible paranasal sinuses, tympanic cavities and mastoids are clear. SOFT TISSUES AND SKULL: Mild soft tissue swelling and stranding lateral to the left orbit, overlying the left zygoma (  series 506 image 3) which appears to remain intact. No soft tissue gas. No skull fracture. Calcified atherosclerosis at the skull base. IMPRESSION: 1. Left periorbital soft tissue injury. 2. No acute intracranial abnormality. Stable non-contrast CT appearance of chronic small vessel disease. Electronically signed by: Helayne Hurst MD 10/17/2024 09:26 AM EST RP Workstation:  [id number]   DG Hand Complete Left Result Date: 10/17/2024 CLINICAL DATA:  Fall.  Left hand pain and bruising. EXAM: LEFT HAND - COMPLETE 3+ VIEW COMPARISON:  None Available. FINDINGS: There is no evidence of fracture or dislocation. Osteoarthritis is seen involving the 2nd and 3rd DIP joints and interphalangeal joint of the thumb. Moderate to severe osteoarthritis is also seen involving the base of the thumb, STT joint complex, and radiocarpal joint. IMPRESSION: No acute findings. Osteoarthritis, as described above. Electronically Signed   By: Norleen DELENA Kil M.D.   On: 10/17/2024 09:24   DG Tibia/Fibula Right Result Date: 10/17/2024 CLINICAL DATA:  Fall.  Right leg injury and pain. EXAM: RIGHT TIBIA AND FIBULA - 2 VIEW COMPARISON:  None Available. FINDINGS: There is no evidence of fracture or other focal bone lesions. Prior knee arthroplasty noted. Peripheral vascular calcification noted. IMPRESSION: No acute findings. Electronically Signed   By: Norleen DELENA Kil M.D.   On: 10/17/2024 09:06   DG Chest Port 1 View Result Date: 10/17/2024 CLINICAL DATA:  Dizziness.  Fall. EXAM: PORTABLE CHEST 1 VIEW COMPARISON:  04/13/2024 FINDINGS: The heart size and mediastinal contours are within normal limits. Low lung volumes again noted. No evidence of pneumothorax or hemothorax. Both lungs are clear. Cervical spine fusion hardware again noted. IMPRESSION: Low lung volumes. No acute findings. Electronically Signed   By: Norleen DELENA Kil M.D.   On: 10/17/2024 09:05    Labs: Recent Labs    10/19/24 0705 10/20/24 0431  WBC 13.0* 10.4  RBC 3.71* 3.43*  HCT 35.7* 32.6*  PLT 157 163   Recent Labs    10/19/24 0705 10/20/24 0431  NA 137 138  K 3.8 3.4*  CL 101 103  CO2 28 29  BUN 11 7*  CREATININE 0.67 0.60  GLUCOSE 82 93  CALCIUM  9.2 8.9   No results for input(s): LABPT, INR in the last 72 hours.  Review of Systems: ROS as detailed in HPI  Physical Exam: Body mass index is 35.67 kg/m.  Physical  Exam   Gen: AAOx3, NAD Comfortable at rest  Right Lower Extremity: Skin intact Well healed scars from prior foot surgery TTP over forefoot (mild) ADF/APF/EHL 5/5 SILT throughout DP, PT 2+ to palp CR < 2s   Assessment and Plan: Ortho consult for right foot contusion after fall, DOI 10/17/24  -history, exam and imaging reviewed at length with patient -XR and CT reviewed - no acute fracture. Two broken screws seen in 1st metatarsal, unclear as to timing of that hardware failure -WBAT RLE in postop shoe with walker -PT/OT -Follow up as outpatient PRN  Lillia Mountain, MD Orthopaedic Surgeon [facility] [phone]  "

## 2024-10-20 NOTE — Progress Notes (Signed)
 " PROGRESS NOTE    Courtney [name]  FMW:[id number] DOB: Aug 09, 1946 DOA: 10/17/2024 PCP: Ransom Other, MD (Confirm with patient/family/NH records and if not entered, this HAS to be entered at [facility] point of entry. No PCP if truly none.)   Chief Complaint  Patient presents with   Fall   Near Syncope    Brief Narrative:  Courtney [name] is a 78 y.o. female with medical history significant of anemia, hyperlipidemia, aortic atherosclerosis, aortic calcification, osteoarthritis of the wrist, asthma, bipolar disorder, depression, hiatal hernia, nephrolithiasis, hypertension, hypothyroidism, mitral valve prolapse, OSA on CPAP, pulmonary embolism, history of CVA with residual right-sided hemiparesis, thrombophlebitis, vertigo, multiple episodes of C. difficile colitis, diverticulosis, diverticulitis who started having diarrhea multiple episodes yesterday and is coming to the emergency department after having a fall at her ALF. ... See H&P for full HPI on admission & ED course.   ED course was most notable for significant leukocytosis WBC 24, negative viral PCRs, lactic acid 3.1.  CT of abdomen and pelvis showed acute colitis affecting the transverse colon and splenic flexure.  UA was also concerning for infection, urine culture has been added on.   Patient was admitted to the hospital on afternoon of 10/17/2024 for further evaluation and management of sepsis with acute on chronic colitis, AKI, hypokalemia as outlined in detail below.   Assessment & Plan:   Principal Problem:   Sepsis due to undetermined organism [facility]) Active Problems:   Hypertension   Dyslipidemia   OSA (obstructive sleep apnea)   CAD (coronary artery disease)   AKI (acute kidney injury)   Hypokalemia   Acute on chronic colitis   Intestinal infection due to enterotoxigenic E. coli  #1 sepsis due to undetermined organism in the setting of acute on chronic colitis/UTI/enterotoxigenic E. coli -Initial antibiotics started  on admission for IV Rocephin , IV Flagyl  empiric oral vancomycin  apparently as recommended by her primary GI for prevention of recurrent C. difficile. - Urine cultures did grow 30,000 colonies of Pseudomonas. - IV Rocephin  changed to cefepime . - Continue Flagyl . - Diet advanced to soft diet which patient is tolerating. - IV antiemetics as needed - Analgesics as needed. - Stool studies positive for enterotoxigenic E. coli. - IV cefepime  changed to ciprofloxacin . - Will discontinue ciprofloxacin , as CT of the right foot with thickening of the distal Achilles tendon reflecting tendinosis with calcification within the distal Achilles tendon may be related to prior trauma. -Supportive care. - Follow.  2.  AKI-resolved -Likely secondary to a prerenal azotemia. - Improved with hydration.  3.  Right foot pain -Patient noted to have suffered a fall prior to admission and presented with persistent right foot pain not previously imaged. - Patient with requisite tenderness over the dorsal midfoot. - X-rays ordered showing prior surgical changes with 1 screw confirmed as broken and a second screw suspected to be broken. - CT of the right foot ordered per orthopedics showed postsurgical changes at the base of the first metatarsal with 2 fractured screws, 1 of which demonstrates up to 5 mm of displacement.  No appreciable acute fracture involving the visualized bones.  No dislocation.  Hallux valgus with mild to moderate degenerative changes of the first MTP joint.  Moderate degenerative changes at the first metatarsal head and hallux sesamoid articulation.  Mild to moderate OA throughout the hindfoot and midfoot.  Well-corticated ossicle at the dorsal aspect of the Lisfranc joint.  Thickening of the distal Achilles tendon may reflect tendinosis..  Calcification within the distal  Achilles tendon may relate to prior trauma. - Currently patient on nonweightbearing. - Consulted orthopedics for further evaluation  and management.  4.  Hypokalemia -Potassium at 3.4, replete - Magnesium  at 1.8. - Magnesium  sulfate 2 g IV x 1.  5.  QTc prolongation -Repeat EKG with resolution of QTc prolongation.  6.  Hypertension -Verapamil .  7.  Hyperlipidemia - Hold statin and may be resumed on discharge.  8. OSA -CPAP nightly.  9.  CAD -Continue aspirin . - Hold statin.    DVT prophylaxis: Lovenox  Code Status: Full Family Communication: Updated patient and husband at bedside. Disposition: TBD  Status is: Inpatient Remains inpatient appropriate because: Severity of illness   Consultants:  Orthopedics  Procedures:  CT head 10/17/2024 CT C-spine 10/17/2024 CT abdomen and pelvis 10/17/2024 CT right foot 10/20/2024 Plan films of the right foot 10/19/2024 Plain films of the left hand 10/17/2024 Plain films of the right tib-fib 10/17/2024 Chest x-ray 10/17/2024  Antimicrobials:  Anti-infectives (From admission, onward)    Start     Dose/Rate Route Frequency Ordered Stop   10/20/24 2000  ciprofloxacin  (CIPRO ) tablet 500 mg        500 mg Oral 2 times daily 10/20/24 1635 10/25/24 1959   10/19/24 1130  ceFEPIme  (MAXIPIME ) 2 g in sodium chloride  0.9 % 100 mL IVPB  Status:  Discontinued        2 g 200 mL/hr over 30 Minutes Intravenous Every 8 hours 10/19/24 1031 10/20/24 1634   10/18/24 1000  cefTRIAXone  (ROCEPHIN ) 2 g in sodium chloride  0.9 % 100 mL IVPB  Status:  Discontinued        2 g 200 mL/hr over 30 Minutes Intravenous Every 24 hours 10/17/24 1330 10/19/24 0957   10/17/24 2200  metroNIDAZOLE  (FLAGYL ) IVPB 500 mg        500 mg 100 mL/hr over 60 Minutes Intravenous Every 12 hours 10/17/24 1330 10/24/24 2159   10/17/24 1800  vancomycin  (VANCOCIN ) capsule 125 mg        125 mg Oral 4 times daily 10/17/24 1542 10/27/24 1759   10/17/24 1015  cefTRIAXone  (ROCEPHIN ) 2 g in sodium chloride  0.9 % 100 mL IVPB       Placed in And Linked Group   2 g 200 mL/hr over 30 Minutes Intravenous  Once  10/17/24 1009 10/17/24 1153   10/17/24 1015  metroNIDAZOLE  (FLAGYL ) IVPB 500 mg       Placed in And Linked Group   500 mg 100 mL/hr over 60 Minutes Intravenous  Once 10/17/24 1009 10/17/24 1117   10/17/24 1015  vancomycin  (VANCOCIN ) capsule 125 mg        125 mg Oral  Once 10/17/24 1009 10/17/24 1455         Subjective: Patient lying in bed.  Overall feels a little bit better.  Patient with some complaints of wheezing.  Denies any chest pain or abdominal pain.  Feels frequency of stools is improving.  Denies any dysuria.  Objective: Vitals:   10/20/24 0539 10/20/24 1219 10/20/24 1726 10/20/24 2045  BP: (!) 143/62 (!) 153/76  (!) 140/73  Pulse: 75 80 81 66  Resp: 20 20 18 20   Temp: 97.9 F (36.6 C) 98.3 F (36.8 C)  99.1 F (37.3 C)  TempSrc: Oral Oral  Oral  SpO2: 95% 98%  97%  Weight:      Height:        Intake/Output Summary (Last 24 hours) at 10/20/2024 2049 Last data filed at 10/20/2024 1000 Gross per  24 hour  Intake 320 ml  Output 1050 ml  Net -730 ml   Filed Weights   10/17/24 0736  Weight: 88.5 kg    Examination:  General exam: Appears calm and comfortable  Respiratory system: Minimal expiratory wheezing.  No use of accessory muscles of respiration.  Speaking in full sentences.  Cardiovascular system: S1 & S2 heard, RRR. No JVD, murmurs, rubs, gallops or clicks. No pedal edema. Gastrointestinal system: Abdomen is nondistended, soft and nontender. No organomegaly or masses felt. Normal bowel sounds heard. Central nervous system: Alert and oriented. No focal neurological deficits. Extremities: Right foot tender to palpation.  Symmetric 5 x 5 power. Skin: No rashes, lesions or ulcers Psychiatry: Judgement and insight appear normal. Mood & affect appropriate.     Data Reviewed: I have personally reviewed following labs and imaging studies  CBC: Recent Labs  Lab 10/17/24 0748 10/17/24 1451 10/19/24 0705 10/20/24 0431  WBC 24.0* 26.3* 13.0* 10.4   NEUTROABS  --  22.6*  --   --   HGB 12.9 11.8* 11.2* 10.2*  HCT 41.5 38.6 35.7* 32.6*  MCV 96.3 97.0 96.2 95.0  PLT 260 222 157 163    Basic Metabolic Panel: Recent Labs  Lab 10/17/24 0748 10/17/24 1451 10/18/24 1651 10/19/24 0705 10/20/24 0431  NA 139 136  --  137 138  K 3.4* 4.9  --  3.8 3.4*  CL 96* 99  --  101 103  CO2 29 28  --  28 29  GLUCOSE 144* 113*  --  82 93  BUN 21 23  --  11 7*  CREATININE 1.41* 1.20*  --  0.67 0.60  CALCIUM  10.5* 9.5  --  9.2 8.9  MG  --   --  1.7  --  1.8    GFR: Estimated Creatinine Clearance: 61.9 mL/min (by C-G formula based on SCr of 0.6 mg/dL).  Liver Function Tests: Recent Labs  Lab 10/17/24 0748 10/17/24 1451  AST 25 35  ALT 16 18  ALKPHOS 144* 144*  BILITOT 0.5 0.4  PROT 6.8 5.9*  ALBUMIN 4.1 3.6    CBG: Recent Labs  Lab 10/17/24 0753  GLUCAP 126*     Recent Results (from the past 240 hours)  Resp panel by RT-PCR (RSV, Flu A&B, Covid) Anterior Nasal Swab     Status: None   Collection Time: 10/17/24  8:04 AM   Specimen: Anterior Nasal Swab  Result Value Ref Range Status   SARS Coronavirus 2 by RT PCR NEGATIVE NEGATIVE Final    Comment: (NOTE) SARS-CoV-2 target nucleic acids are NOT DETECTED.  The SARS-CoV-2 RNA is generally detectable in upper respiratory specimens during the acute phase of infection. The lowest concentration of SARS-CoV-2 viral copies this assay can detect is 138 copies/mL. A negative result does not preclude SARS-Cov-2 infection and should not be used as the sole basis for treatment or other patient management decisions. A negative result may occur with  improper specimen collection/handling, submission of specimen other than nasopharyngeal swab, presence of viral mutation(s) within the areas targeted by this assay, and inadequate number of viral copies(<138 copies/mL). A negative result must be combined with clinical observations, patient history, and epidemiological information. The  expected result is Negative.  Fact Sheet for Patients:  bloggercourse.com  Fact Sheet for Healthcare Providers:  seriousbroker.it  This test is no t yet approved or cleared by the [place]  FDA and  has been authorized for detection and/or diagnosis of SARS-CoV-2 by FDA under an  Emergency Use Authorization (EUA). This EUA will remain  in effect (meaning this test can be used) for the duration of the COVID-19 declaration under Section 564(b)(1) of the Act, 21 U.S.C.section 360bbb-3(b)(1), unless the authorization is terminated  or revoked sooner.       Influenza A by PCR NEGATIVE NEGATIVE Final   Influenza B by PCR NEGATIVE NEGATIVE Final    Comment: (NOTE) The Xpert Xpress SARS-CoV-2/FLU/RSV plus assay is intended as an aid in the diagnosis of influenza from Nasopharyngeal swab specimens and should not be used as a sole basis for treatment. Nasal washings and aspirates are unacceptable for Xpert Xpress SARS-CoV-2/FLU/RSV testing.  Fact Sheet for Patients: bloggercourse.com  Fact Sheet for Healthcare Providers: seriousbroker.it  This test is not yet approved or cleared by the [place]  FDA and has been authorized for detection and/or diagnosis of SARS-CoV-2 by FDA under an Emergency Use Authorization (EUA). This EUA will remain in effect (meaning this test can be used) for the duration of the COVID-19 declaration under Section 564(b)(1) of the Act, 21 U.S.C. section 360bbb-3(b)(1), unless the authorization is terminated or revoked.     Resp Syncytial Virus by PCR NEGATIVE NEGATIVE Final    Comment: (NOTE) Fact Sheet for Patients: bloggercourse.com  Fact Sheet for Healthcare Providers: seriousbroker.it  This test is not yet approved or cleared by the [place]  FDA and has been authorized for detection and/or  diagnosis of SARS-CoV-2 by FDA under an Emergency Use Authorization (EUA). This EUA will remain in effect (meaning this test can be used) for the duration of the COVID-19 declaration under Section 564(b)(1) of the Act, 21 U.S.C. section 360bbb-3(b)(1), unless the authorization is terminated or revoked.  Performed at [facility], [address], [place], [place] [address]   Blood Culture (routine x 2)     Status: None (Preliminary result)   Collection Time: 10/17/24  8:48 AM   Specimen: BLOOD  Result Value Ref Range Status   Specimen Description   Final    BLOOD BLOOD RIGHT FOREARM Performed at [facility], [address], [place], [place] [address]    Special Requests   Final    BOTTLES DRAWN AEROBIC AND ANAEROBIC Blood Culture results may not be optimal due to an inadequate volume of blood received in culture bottles Performed at [facility], [address], [place], [place] [address]    Culture   Final    NO GROWTH 3 DAYS Performed at [facility], [address], [place], [place] [address]    Report Status PENDING  Incomplete  Blood Culture (routine x 2)     Status: None (Preliminary result)   Collection Time: 10/17/24  8:56 AM   Specimen: BLOOD  Result Value Ref Range Status   Specimen Description   Final    BLOOD RIGHT ANTECUBITAL Performed at [facility], [address], [place], [place] [address]    Special Requests   Final    BOTTLES DRAWN AEROBIC AND ANAEROBIC Blood Culture adequate volume Performed at [facility], [address] [place], [place] [address]    Culture   Final    NO GROWTH 3 DAYS Performed at [facility], [address], [place], [place] [address]    Report Status PENDING  Incomplete  Urine Culture (for pregnant, neutropenic or urologic patients or patients with an indwelling urinary catheter)     Status: Abnormal (Preliminary result)    Collection  Time: 10/17/24  9:25 AM   Specimen: Urine, Clean Catch  Result Value Ref Range Status   Specimen Description   Final    URINE, CLEAN CATCH Performed at [facility], [address] [place], [place] [address]    Special Requests   Final    NONE Performed at [facility], [address], [place], [place] [address]    Culture (A)  Final    30,000 COLONIES/mL PSEUDOMONAS AERUGINOSA 30,000 COLONIES/mL ENTEROCOCCUS FAECIUM SUSCEPTIBILITIES TO FOLLOW Performed at [facility], [address] [place], [place] [address]    Report Status PENDING  Incomplete   Organism ID, Bacteria PSEUDOMONAS AERUGINOSA (A)  Final      Susceptibility   Pseudomonas aeruginosa - MIC*    MEROPENEM <=0.25 SENSITIVE Sensitive     CIPROFLOXACIN  0.5 SENSITIVE Sensitive     IMIPENEM 1 SENSITIVE Sensitive     PIP/TAZO Value in next row Sensitive      8 SENSITIVEThis is a modified FDA-approved test that has been validated and its performance characteristics determined by the reporting laboratory.  This laboratory is certified under the Clinical Laboratory Improvement Amendments CLIA as qualified to perform high complexity clinical laboratory testing.    CEFTAZIDIME/AVIBACTAM Value in next row Sensitive      8 SENSITIVEThis is a modified FDA-approved test that has been validated and its performance characteristics determined by the reporting laboratory.  This laboratory is certified under the Clinical Laboratory Improvement Amendments CLIA as qualified to perform high complexity clinical laboratory testing.    CEFTOLOZANE/TAZOBACTAM Value in next row Sensitive      8 SENSITIVEThis is a modified FDA-approved test that has been validated and its performance characteristics determined by the reporting laboratory.  This laboratory is certified under the Clinical Laboratory Improvement Amendments CLIA as qualified to perform high complexity clinical laboratory testing.     TOBRAMYCIN Value in next row Sensitive      8 SENSITIVEThis is a modified FDA-approved test that has been validated and its performance characteristics determined by the reporting laboratory.  This laboratory is certified under the Clinical Laboratory Improvement Amendments CLIA as qualified to perform high complexity clinical laboratory testing.    CEFTAZIDIME Value in next row Sensitive      8 SENSITIVEThis is a modified FDA-approved test that has been validated and its performance characteristics determined by the reporting laboratory.  This laboratory is certified under the Clinical Laboratory Improvement Amendments CLIA as qualified to perform high complexity clinical laboratory testing.    * 30,000 COLONIES/mL PSEUDOMONAS AERUGINOSA  C Difficile Quick Screen w PCR reflex     Status: None   Collection Time: 10/18/24 12:47 PM   Specimen: STOOL  Result Value Ref Range Status   C Diff antigen NEGATIVE NEGATIVE Final   C Diff toxin NEGATIVE NEGATIVE Final   C Diff interpretation No C. difficile detected.  Final    Comment: Performed at [facility], [address], [place], [place] [address]  Gastrointestinal Panel by PCR , Stool     Status: Abnormal   Collection Time: 10/18/24 12:47 PM   Specimen: STOOL  Result Value Ref Range Status   Campylobacter species NOT DETECTED NOT DETECTED Final   Plesimonas shigelloides NOT DETECTED NOT DETECTED Final   Salmonella species NOT DETECTED NOT DETECTED Final   Yersinia enterocolitica NOT DETECTED NOT DETECTED Final   Vibrio species NOT DETECTED NOT DETECTED Final   Vibrio cholerae NOT DETECTED NOT DETECTED Final  Enteroaggregative E coli (EAEC) NOT DETECTED NOT DETECTED Final   Enteropathogenic E coli (EPEC) NOT DETECTED NOT DETECTED Final   Enterotoxigenic E coli (ETEC) DETECTED (A) NOT DETECTED Final    Comment: RESULT CALLED TO, READ BACK BY AND VERIFIED WITH: HILLARY MASHBURN 10/20/24 1219 MU    Shiga like toxin producing  E coli (STEC) NOT DETECTED NOT DETECTED Final   Shigella/Enteroinvasive E coli (EIEC) NOT DETECTED NOT DETECTED Final   Cryptosporidium NOT DETECTED NOT DETECTED Final   Cyclospora cayetanensis NOT DETECTED NOT DETECTED Final   Entamoeba histolytica NOT DETECTED NOT DETECTED Final   Giardia lamblia NOT DETECTED NOT DETECTED Final   Adenovirus F40/41 NOT DETECTED NOT DETECTED Final   Astrovirus NOT DETECTED NOT DETECTED Final   Norovirus GI/GII NOT DETECTED NOT DETECTED Final   Rotavirus A NOT DETECTED NOT DETECTED Final   Sapovirus (I, II, IV, and V) NOT DETECTED NOT DETECTED Final    Comment: Performed at [facility], [address], [place], [place] [address]         Radiology Studies: CT FOOT RIGHT WO CONTRAST Result Date: 10/20/2024 CLINICAL DATA:  Foot pain.  Recent fall. EXAM: CT OF THE RIGHT FOOT WITHOUT CONTRAST TECHNIQUE: Multidetector CT imaging of the right foot was performed according to the standard protocol. Multiplanar CT image reconstructions were also generated. RADIATION DOSE REDUCTION: This exam was performed according to the departmental dose-optimization program which includes automated exposure control, adjustment of the mA and/or kV according to patient size and/or use of iterative reconstruction technique. COMPARISON:  Right foot radiographs dated 10/19/2024. FINDINGS: Bones/Joint/Cartilage Diffuse osseous demineralization. Postsurgical changes at the base of the first metatarsal with 2 fractured cannulated partially threaded screws, one of which demonstrates up to 5 mm of displacement (series 5, images 101-103). No appreciable acute fracture involving the visualized bones. No dislocation. Well corticated ossicle at the dorsal aspect of the Lisfranc joint. Mild-to-moderate osteoarthritis throughout the hindfoot and midfoot, most pronounced at the calcaneocuboid, talonavicular, first and second TMT joints. Hallux valgus with mild-to-moderate degenerative changes  of the first MTP joint. Moderate degenerative changes at the first metatarsal head-hallux sesamoid articulation. Plantar calcaneal spur. Ligaments Ligaments are suboptimally evaluated by CT. Muscles and Tendons Thickening of the distal Achilles tendon, which may reflect tendinosis. Foci of calcification within the distal Achilles tendon may relate to prior trauma. Generalized atrophy of the musculature. Soft tissue No fluid collection or hematoma. IMPRESSION: 1. Postsurgical changes at the base of the first metatarsal with 2 fractured screws, one of which demonstrates up to 5 mm of displacement. 2. No appreciable acute fracture involving the visualized bones. No dislocation. 3. Hallux valgus with mild-to-moderate degenerative changes of the first MTP joint. 4. Moderate degenerative changes at the first metatarsal head-hallux sesamoid articulation. 5. Mild-to-moderate osteoarthritis throughout the hindfoot and midfoot. Well corticated ossicle at the dorsal aspect of the Lisfranc joint. 6. Thickening of the distal Achilles tendon may reflect tendinosis. Foci of calcification within the distal Achilles tendon may relate to prior trauma. Electronically Signed   By: Harrietta Sherry M.D.   On: 10/20/2024 14:10   DG Foot 2 Views Right Result Date: 10/19/2024 CLINICAL DATA:  Right foot pain. EXAM: RIGHT FOOT - 2 VIEW COMPARISON:  None Available. FINDINGS: Postsurgical change in the right proximal metatarsal. One of the screws is broken. The other screw is also likely broken and displaced with surrounding lucency. Hallux valgus with degenerative change of the first metatarsal phalangeal joint. Mild hammertoe deformity of the toes.  Moderate midfoot osteoarthritis. Moderate plantar calcaneal spur. No evidence of acute fracture. Mild generalized soft tissue edema. IMPRESSION: 1. Postsurgical change in the right proximal metatarsal. One of the screws is broken. The other screw is also likely broken and displaced with  surrounding lucency. 2. Hallux valgus with degenerative change of the first metatarsophalangeal joint. 3. Moderate midfoot osteoarthritis. Electronically Signed   By: Andrea Gasman M.D.   On: 10/19/2024 16:40        Scheduled Meds:  aspirin  EC  325 mg Oral Daily   budesonide -glycopyrrolate -formoterol   2 puff Inhalation BID   buPROPion   150 mg Oral Daily   ciprofloxacin   500 mg Oral BID   enoxaparin  (LOVENOX ) injection  40 mg Subcutaneous Q24H   escitalopram   20 mg Oral Daily   fluticasone   1 spray Each Nare Daily   gabapentin   100 mg Oral BID   gabapentin   300 mg Oral QHS   levothyroxine   50 mcg Oral QAC breakfast   LORazepam   1 mg Oral QHS   melatonin  5 mg Oral QHS   montelukast   10 mg Oral QHS   pantoprazole   40 mg Oral Daily   vancomycin   125 mg Oral QID   verapamil   360 mg Oral Daily   Continuous Infusions:  metronidazole  500 mg (10/20/24 1029)     LOS: 3 days    Time spent: 40 minutes    Toribio Hummer, MD [facility]   To contact the attending provider between 7A-7P or the covering provider during after hours 7P-7A, please log into the web site [email or web address] and access using universal [place] password for that web site. If you do not have the password, please call the hospital operator.  10/20/2024, 8:49 PM    "

## 2024-10-20 NOTE — Care Management Important Message (Signed)
 impImportant Message  Patient Details IM Letter given. Name: Courtney [name] MRN: [id number] Date of Birth: June 20, 1946   Important Message Given:  Yes - Medicare IM     Melba Ates 10/20/2024, 11:21 AM

## 2024-10-20 NOTE — Plan of Care (Signed)
  Problem: Education: Goal: Knowledge of General Education information will improve Description: Including pain rating scale, medication(s)/side effects and non-pharmacologic comfort measures Outcome: Progressing   Problem: Nutrition: Goal: Adequate nutrition will be maintained Outcome: Progressing   Problem: Coping: Goal: Level of anxiety will decrease Outcome: Progressing   Problem: Elimination: Goal: Will not experience complications related to bowel motility Outcome: Progressing Goal: Will not experience complications related to urinary retention Outcome: Progressing   Problem: Pain Managment: Goal: General experience of comfort will improve and/or be controlled Outcome: Progressing   Problem: Safety: Goal: Ability to remain free from injury will improve Outcome: Progressing   Problem: Skin Integrity: Goal: Risk for impaired skin integrity will decrease Outcome: Progressing

## 2024-10-20 NOTE — Progress Notes (Signed)
" °   10/20/24 2159  BiPAP/CPAP/SIPAP  BiPAP/CPAP/SIPAP Pt Type Adult  BiPAP/CPAP/SIPAP DREAMSTATIOND  Mask Type Nasal mask  Dentures removed? Not applicable  Mask Size Medium  Respiratory Rate 18 breaths/min  Flow Rate 3 lpm  Patient Home Machine No  Patient Home Mask No  Patient Home Tubing No  Auto Titrate Yes  Minimum cmH2O 5 cmH2O  Maximum cmH2O 20 cmH2O  Nasal massage performed Yes  Device Plugged into RED Power Outlet Yes  BiPAP/CPAP /SiPAP Vitals  Pulse Rate 62  Resp 18  SpO2 94 %    "

## 2024-10-21 DIAGNOSIS — K529 Noninfective gastroenteritis and colitis, unspecified: Secondary | ICD-10-CM | POA: Diagnosis not present

## 2024-10-21 DIAGNOSIS — I2510 Atherosclerotic heart disease of native coronary artery without angina pectoris: Secondary | ICD-10-CM | POA: Diagnosis not present

## 2024-10-21 DIAGNOSIS — Z8619 Personal history of other infectious and parasitic diseases: Secondary | ICD-10-CM

## 2024-10-21 DIAGNOSIS — A044 Other intestinal Escherichia coli infections: Secondary | ICD-10-CM | POA: Diagnosis not present

## 2024-10-21 DIAGNOSIS — A419 Sepsis, unspecified organism: Secondary | ICD-10-CM | POA: Diagnosis not present

## 2024-10-21 DIAGNOSIS — G4733 Obstructive sleep apnea (adult) (pediatric): Secondary | ICD-10-CM | POA: Diagnosis not present

## 2024-10-21 LAB — BASIC METABOLIC PANEL WITH GFR
Anion gap: 7 (ref 5–15)
BUN: 6 mg/dL — ABNORMAL LOW (ref 8–23)
CO2: 29 mmol/L (ref 22–32)
Calcium: 8.9 mg/dL (ref 8.9–10.3)
Chloride: 100 mmol/L (ref 98–111)
Creatinine, Ser: 0.58 mg/dL (ref 0.44–1.00)
GFR, Estimated: >60 mL/min
Glucose, Bld: 87 mg/dL (ref 70–99)
Potassium: 3.6 mmol/L (ref 3.5–5.1)
Sodium: 137 mmol/L (ref 135–145)

## 2024-10-21 LAB — CBC
HCT: 31.3 % — ABNORMAL LOW (ref 36.0–46.0)
Hemoglobin: 10 g/dL — ABNORMAL LOW (ref 12.0–15.0)
MCH: 29.8 pg (ref 26.0–34.0)
MCHC: 31.9 g/dL (ref 30.0–36.0)
MCV: 93.2 fL (ref 80.0–100.0)
Platelets: 147 K/uL — ABNORMAL LOW (ref 150–400)
RBC: 3.36 MIL/uL — ABNORMAL LOW (ref 3.87–5.11)
RDW: 12.9 % (ref 11.5–15.5)
WBC: 9.6 K/uL (ref 4.0–10.5)
nRBC: 0 % (ref 0.0–0.2)

## 2024-10-21 LAB — URINE CULTURE: Culture: 30000 — AB

## 2024-10-21 LAB — MAGNESIUM: Magnesium: 2 mg/dL (ref 1.7–2.4)

## 2024-10-21 MED ORDER — VANCOMYCIN HCL 125 MG PO CAPS
125.0000 mg | ORAL_CAPSULE | Freq: Two times a day (BID) | ORAL | Status: DC
  Administered 2024-10-21 – 2024-10-26 (×11): 125 mg via ORAL
  Filled 2024-10-21 (×12): qty 1

## 2024-10-21 MED ORDER — FAMOTIDINE 20 MG PO TABS
40.0000 mg | ORAL_TABLET | Freq: Every day | ORAL | Status: DC
  Administered 2024-10-22 – 2024-10-26 (×5): 40 mg via ORAL
  Filled 2024-10-21 (×5): qty 2

## 2024-10-21 MED ORDER — METHYLPREDNISOLONE SODIUM SUCC 40 MG IJ SOLR
40.0000 mg | Freq: Once | INTRAMUSCULAR | Status: AC
  Administered 2024-10-21: 40 mg via INTRAVENOUS
  Filled 2024-10-21: qty 1

## 2024-10-21 MED ORDER — IPRATROPIUM-ALBUTEROL 0.5-2.5 (3) MG/3ML IN SOLN
3.0000 mL | Freq: Three times a day (TID) | RESPIRATORY_TRACT | Status: DC
  Administered 2024-10-21 – 2024-10-23 (×5): 3 mL via RESPIRATORY_TRACT
  Filled 2024-10-21 (×5): qty 3

## 2024-10-21 NOTE — TOC Initial Note (Addendum)
 Transition of Care [facility]) - Initial/Assessment Note   Patient Details  Name: Courtney [name] MRN: [id number] Date of Birth: 1946-05-13  Transition of Care [facility]) CM/SW Contact:    Duwaine GORMAN Aran, LCSW Phone Number: 10/21/2024, 12:33 PM  Clinical Narrative: Patient resides with spouse in independent living at [facility]. PT/OT consulted. Care management awaiting recommendations.  Addendum: PT evaluation recommended SNF. Patient is agreeable and would like to go to [facility]. Patient reported her insurance changed today to [facility] [phone]). FL2 done; PASRR confirmed. Initial referral faxed to [facility] in hub.  Expected Discharge Plan:  (TBD) Barriers to Discharge: Continued Medical Work up  Expected Discharge Plan and Services In-house Referral: Clinical Social Work Living arrangements for the past 2 months: Independent Living Facility  Prior Living Arrangements/Services Living arrangements for the past 2 months: Independent Living Facility Lives with:: Spouse Patient language and need for interpreter reviewed:: Yes Do you feel safe going back to the place where you live?: Yes      Need for Family Participation in Patient Care: No (Comment) Care giver support system in place?: Yes (comment) Current home services: DME ([employer]) Criminal Activity/Legal Involvement Pertinent to Current Situation/Hospitalization: No - Comment as needed  Activities of Daily Living ADL Screening (condition at time of admission) Independently performs ADLs?: Yes (appropriate for developmental age) Is the patient deaf or have difficulty hearing?: No Does the patient have difficulty seeing, even when wearing glasses/contacts?: No Does the patient have difficulty concentrating, remembering, or making decisions?: No  Emotional Assessment Orientation: : Oriented to Self, Oriented to Place, Oriented to  Time, Oriented to Situation Alcohol / Substance Use: Not Applicable Psych Involvement: No  (comment)  Admission diagnosis:  Colitis [K52.9] AKI (acute kidney injury) [N17.9] Fall, initial encounter [W19.XXXA] Sepsis due to undetermined organism (HCC) [A41.9] Hematoma of left hand [S60.222A] Patient Active Problem List   Diagnosis Date Noted   Intestinal infection due to enterotoxigenic E. coli 10/20/2024   Sepsis due to undetermined organism (HCC) 10/17/2024   Acute on chronic colitis 10/17/2024   Leg edema 10/06/2024   Acute kidney injury 04/11/2024   Moderate persistent asthma with acute exacerbation 04/11/2024   Right hemiparesis (HCC) 02/06/2022   Chronic ulcerative pancolitis (HCC) 02/06/2022   Anxiety 01/02/2022   Chest pain 01/02/2022   Moderate recurrent major depression and anxiety  01/02/2022   History of CVA (cerebrovascular accident) 01/02/2022   Urinary tract infection symptoms 01/02/2022   Diverticulitis 12/07/2021   Acute diverticulitis of intestine 12/07/2021   Diarrhea    Hypokalemia 11/20/2021   COVID-19 infection with colitis 11/18/2021   CAD (coronary artery disease) 11/18/2021   Colitis with rectal bleeding 11/18/2021   AKI (acute kidney injury) 11/18/2021   Hypertension 04/02/2021   Hyponatremia 04/02/2021   Dyslipidemia 04/02/2021   Hypothyroidism (acquired) 04/02/2021   OSA (obstructive sleep apnea) 04/02/2021   Cervical spondylosis with myelopathy and radiculopathy 03/28/2021   Idiopathic peripheral neuropathy 11/02/2019   Anemia 12/12/2017   PCP:  Ransom Other, MD Pharmacy:   [facility] [id number] [place], [place] - [address] [place] [address] Phone: [phone] Fax: [phone]  Social Drivers of Health (SDOH) Social History: SDOH Screenings   Food Insecurity: No Food Insecurity (10/19/2024)  Housing: Low Risk (10/19/2024)  Transportation Needs: No Transportation Needs (10/19/2024)  Utilities: Not At Risk (10/19/2024)  Social Connections: Moderately Integrated (10/19/2024)   Tobacco Use: Low Risk (10/17/2024)   SDOH Interventions:    Readmission Risk Interventions  10/21/2024   12:32 PM 04/13/2024    3:55 PM  Readmission Risk Prevention Plan  Transportation Screening Complete Complete  PCP or Specialist Appt within 5-7 Days  Complete  Home Care Screening  Complete  Medication Review (RN CM)  Complete  HRI or Home Care Consult Complete   Social Work Consult for Recovery Care Planning/Counseling Complete   Palliative Care Screening Not Applicable   Medication Review Oceanographer) Complete

## 2024-10-21 NOTE — Progress Notes (Addendum)
 " PROGRESS NOTE    Courtney [name]  FMW:[id number] DOB: [birth year] DOA: 10/17/2024 PCP: Ransom Other, MD   Chief Complaint  Patient presents with   Fall   Near Syncope    Brief Narrative:  Courtney [name] is a 79 y.o. female with medical history significant of anemia, hyperlipidemia, aortic atherosclerosis, aortic calcification, osteoarthritis of the wrist, asthma, bipolar disorder, depression, hiatal hernia, nephrolithiasis, hypertension, hypothyroidism, mitral valve prolapse, OSA on CPAP, pulmonary embolism, history of CVA with residual right-sided hemiparesis, thrombophlebitis, vertigo, multiple episodes of C. difficile colitis, diverticulosis, diverticulitis who started having diarrhea multiple episodes yesterday and is coming to the emergency department after having a fall at her ALF. ... See H&P for full HPI on admission & ED course.   ED course was most notable for significant leukocytosis WBC 24, negative viral PCRs, lactic acid 3.1.  CT of abdomen and pelvis showed acute colitis affecting the transverse colon and splenic flexure.  UA was also concerning for infection, urine culture has been added on.   Patient was admitted to the hospital on afternoon of 10/17/2024 for further evaluation and management of sepsis with acute on chronic colitis, AKI, hypokalemia as outlined in detail below.   Assessment & Plan:   Principal Problem:   Sepsis due to undetermined organism [facility]) Active Problems:   Hypertension   Dyslipidemia   OSA (obstructive sleep apnea)   CAD (coronary artery disease)   AKI (acute kidney injury)   Hypokalemia   Acute on chronic colitis   Intestinal infection due to enterotoxigenic E. coli  #1 sepsis due to undetermined organism in the setting of acute on chronic colitis/UTI/enterotoxigenic E. coli -Initial antibiotics started on admission for IV Rocephin , IV Flagyl  empiric oral vancomycin  apparently as recommended by her primary GI for prevention of  recurrent C. difficile. - Urine cultures did grow 30,000 colonies of Pseudomonas. - IV Rocephin  changed to cefepime . - Continue Flagyl . - Diet advanced to soft diet which patient is tolerating. - IV antiemetics as needed - Analgesics as needed. - Stool studies positive for enterotoxigenic E. coli. - IV cefepime  changed to ciprofloxacin  and Cipro  subsequently discontinued as CT of the right foot with thickening of the distal Achilles tendon reflecting tendinosis with calcification within the distal Achilles tendon may be related to prior trauma. -Patient seen in consultation by ID who are recommending discontinuation of systemic antibiotics, continuation of twice daily p.o. Vanco 125 mg x 7 more days until 10/28/2024, continuation of standard enteric isolation precautions. -ID recommending stopping Nexium if patient does not have PUD or Barrett's esophagus as his high risk for C. difficile. -Supportive care. - Follow.  2.  AKI-resolved -Likely secondary to a prerenal azotemia. - Resolved with hydration.   3.  Right foot pain -Patient noted to have suffered a fall prior to admission and presented with persistent right foot pain not previously imaged. - Patient with requisite tenderness over the dorsal midfoot. - X-rays ordered showing prior surgical changes with 1 screw confirmed as broken and a second screw suspected to be broken. - CT of the right foot ordered per orthopedics showed postsurgical changes at the base of the first metatarsal with 2 fractured screws, 1 of which demonstrates up to 5 mm of displacement.  No appreciable acute fracture involving the visualized bones.  No dislocation.  Hallux valgus with mild to moderate degenerative changes of the first MTP joint.  Moderate degenerative changes at the first metatarsal head and hallux sesamoid articulation.  Mild to moderate OA  throughout the hindfoot and midfoot.  Well-corticated ossicle at the dorsal aspect of the Lisfranc joint.   Thickening of the distal Achilles tendon may reflect tendinosis..  Calcification within the distal Achilles tendon may relate to prior trauma. - Per orthopedic note x-ray and CT scans reviewed and no acute fracture noted, 2 broken screws seen in the first metatarsal, unclear as to timing of hardware failure.   - Orthopedics recommending WBAT RLE in postop shoe with walker.  PT/OT. - Outpatient follow-up with orthopedics.   4.  Hypokalemia - Likely secondary to GI losses.   - Potassium at 3.6 today.   - Magnesium  at 2.0.   - Repeat labs in the AM.    5.  QTc prolongation -Repeat EKG with resolution of QTc prolongation.  6.  Hypertension - Controlled on verapamil .   7.  Hyperlipidemia - Continue to hold statin and resume on discharge.  8. OSA -CPAP nightly.  9.  CAD -Continue aspirin . - Continue to hold statin and resume on discharge.  10.  History of asthma per patient -Patient with some expiratory wheezing noted on exam. - Continue Breztri . - Place on scheduled DuoNebs twice daily. - IV Solu-Medrol  40 mg x 1.    DVT prophylaxis: Lovenox  Code Status: Full Family Communication: Updated patient and husband at bedside. Disposition: TBD  Status is: Inpatient Remains inpatient appropriate because: Severity of illness   Consultants:  Orthopedics ID: Dr.Vu 10/21/2024  Procedures:  CT head 10/17/2024 CT C-spine 10/17/2024 CT abdomen and pelvis 10/17/2024 CT right foot 10/20/2024 Plan films of the right foot 10/19/2024 Plain films of the left hand 10/17/2024 Plain films of the right tib-fib 10/17/2024 Chest x-ray 10/17/2024  Antimicrobials:  Anti-infectives (From admission, onward)    Start     Dose/Rate Route Frequency Ordered Stop   10/21/24 1415  vancomycin  (VANCOCIN ) capsule 125 mg        125 mg Oral 2 times daily 10/21/24 1318 10/28/24 0959   10/20/24 2000  ciprofloxacin  (CIPRO ) tablet 500 mg  Status:  Discontinued        500 mg Oral 2 times daily 10/20/24  1635 10/20/24 2050   10/19/24 1130  ceFEPIme  (MAXIPIME ) 2 g in sodium chloride  0.9 % 100 mL IVPB  Status:  Discontinued        2 g 200 mL/hr over 30 Minutes Intravenous Every 8 hours 10/19/24 1031 10/20/24 1634   10/18/24 1000  cefTRIAXone  (ROCEPHIN ) 2 g in sodium chloride  0.9 % 100 mL IVPB  Status:  Discontinued        2 g 200 mL/hr over 30 Minutes Intravenous Every 24 hours 10/17/24 1330 10/19/24 0957   10/17/24 2200  metroNIDAZOLE  (FLAGYL ) IVPB 500 mg  Status:  Discontinued        500 mg 100 mL/hr over 60 Minutes Intravenous Every 12 hours 10/17/24 1330 10/21/24 0932   10/17/24 1800  vancomycin  (VANCOCIN ) capsule 125 mg  Status:  Discontinued        125 mg Oral 4 times daily 10/17/24 1542 10/21/24 0932   10/17/24 1015  cefTRIAXone  (ROCEPHIN ) 2 g in sodium chloride  0.9 % 100 mL IVPB       Placed in And Linked Group   2 g 200 mL/hr over 30 Minutes Intravenous  Once 10/17/24 1009 10/17/24 1153   10/17/24 1015  metroNIDAZOLE  (FLAGYL ) IVPB 500 mg       Placed in And Linked Group   500 mg 100 mL/hr over 60 Minutes Intravenous  Once 10/17/24 1009 10/17/24  1117   10/17/24 1015  vancomycin  (VANCOCIN ) capsule 125 mg        125 mg Oral  Once 10/17/24 1009 10/17/24 1455         Subjective: Patient sitting up in recliner.  Denies any chest pain or shortness of breath.  States frequency and consistency of stools has improved.  Denies any bowel movement today.  Still with complaints of right foot pain.  Patient interested in SNF.  Husband at bedside.   Objective: Vitals:   10/20/24 2045 10/20/24 2159 10/21/24 0528 10/21/24 0544  BP: (!) 140/73  (!) 147/109   Pulse: 66 62 73   Resp: 20 18 17 19   Temp: 99.1 F (37.3 C)  98.7 F (37.1 C)   TempSrc: Oral  Oral   SpO2: 97% 94% 94%   Weight:      Height:        Intake/Output Summary (Last 24 hours) at 10/21/2024 1419 Last data filed at 10/21/2024 1300 Gross per 24 hour  Intake 1177 ml  Output 1600 ml  Net -423 ml   Filed Weights    10/17/24 0736  Weight: 88.5 kg    Examination:  General exam: NAD. Respiratory system: Minimal expiratory wheezes.  No use of accessory muscles of respiration.  Speaking in full sentences.  Cardiovascular system: Regular rate rhythm no murmurs rubs or gallops.  No JVD.  No lower extremity edema.  Gastrointestinal system: Abdomen is soft, nontender, nondistended, positive bowel sounds.  No rebound.  No guarding.  Central nervous system: Alert and oriented. No focal neurological deficits. Extremities: Right foot tender to palpation.  Symmetric 5 x 5 power. Skin: No rashes, lesions or ulcers Psychiatry: Judgement and insight appear normal. Mood & affect appropriate.     Data Reviewed: I have personally reviewed following labs and imaging studies  CBC: Recent Labs  Lab 10/17/24 0748 10/17/24 1451 10/19/24 0705 10/20/24 0431 10/21/24 0436  WBC 24.0* 26.3* 13.0* 10.4 9.6  NEUTROABS  --  22.6*  --   --   --   HGB 12.9 11.8* 11.2* 10.2* 10.0*  HCT 41.5 38.6 35.7* 32.6* 31.3*  MCV 96.3 97.0 96.2 95.0 93.2  PLT 260 222 157 163 147*    Basic Metabolic Panel: Recent Labs  Lab 10/17/24 0748 10/17/24 1451 10/18/24 1651 10/19/24 0705 10/20/24 0431 10/21/24 0436  NA 139 136  --  137 138 137  K 3.4* 4.9  --  3.8 3.4* 3.6  CL 96* 99  --  101 103 100  CO2 29 28  --  28 29 29   GLUCOSE 144* 113*  --  82 93 87  BUN 21 23  --  11 7* 6*  CREATININE 1.41* 1.20*  --  0.67 0.60 0.58  CALCIUM  10.5* 9.5  --  9.2 8.9 8.9  MG  --   --  1.7  --  1.8 2.0    GFR: Estimated Creatinine Clearance: 61.9 mL/min (by C-G formula based on SCr of 0.58 mg/dL).  Liver Function Tests: Recent Labs  Lab 10/17/24 0748 10/17/24 1451  AST 25 35  ALT 16 18  ALKPHOS 144* 144*  BILITOT 0.5 0.4  PROT 6.8 5.9*  ALBUMIN 4.1 3.6    CBG: Recent Labs  Lab 10/17/24 0753  GLUCAP 126*     Recent Results (from the past 240 hours)  Resp panel by RT-PCR (RSV, Flu A&B, Covid) Anterior Nasal Swab      Status: None   Collection Time: 10/17/24  8:04  AM   Specimen: Anterior Nasal Swab  Result Value Ref Range Status   SARS Coronavirus 2 by RT PCR NEGATIVE NEGATIVE Final    Comment: (NOTE) SARS-CoV-2 target nucleic acids are NOT DETECTED.  The SARS-CoV-2 RNA is generally detectable in upper respiratory specimens during the acute phase of infection. The lowest concentration of SARS-CoV-2 viral copies this assay can detect is 138 copies/mL. A negative result does not preclude SARS-Cov-2 infection and should not be used as the sole basis for treatment or other patient management decisions. A negative result may occur with  improper specimen collection/handling, submission of specimen other than nasopharyngeal swab, presence of viral mutation(s) within the areas targeted by this assay, and inadequate number of viral copies(<138 copies/mL). A negative result must be combined with clinical observations, patient history, and epidemiological information. The expected result is Negative.  Fact Sheet for Patients:  bloggercourse.com  Fact Sheet for Healthcare Providers:  seriousbroker.it  This test is no t yet approved or cleared by the [place]  FDA and  has been authorized for detection and/or diagnosis of SARS-CoV-2 by FDA under an Emergency Use Authorization (EUA). This EUA will remain  in effect (meaning this test can be used) for the duration of the COVID-19 declaration under Section 564(b)(1) of the Act, 21 U.S.C.section 360bbb-3(b)(1), unless the authorization is terminated  or revoked sooner.       Influenza A by PCR NEGATIVE NEGATIVE Final   Influenza B by PCR NEGATIVE NEGATIVE Final    Comment: (NOTE) The Xpert Xpress SARS-CoV-2/FLU/RSV plus assay is intended as an aid in the diagnosis of influenza from Nasopharyngeal swab specimens and should not be used as a sole basis for treatment. Nasal washings and aspirates are  unacceptable for Xpert Xpress SARS-CoV-2/FLU/RSV testing.  Fact Sheet for Patients: bloggercourse.com  Fact Sheet for Healthcare Providers: seriousbroker.it  This test is not yet approved or cleared by the [place]  FDA and has been authorized for detection and/or diagnosis of SARS-CoV-2 by FDA under an Emergency Use Authorization (EUA). This EUA will remain in effect (meaning this test can be used) for the duration of the COVID-19 declaration under Section 564(b)(1) of the Act, 21 U.S.C. section 360bbb-3(b)(1), unless the authorization is terminated or revoked.     Resp Syncytial Virus by PCR NEGATIVE NEGATIVE Final    Comment: (NOTE) Fact Sheet for Patients: bloggercourse.com  Fact Sheet for Healthcare Providers: seriousbroker.it  This test is not yet approved or cleared by the [place]  FDA and has been authorized for detection and/or diagnosis of SARS-CoV-2 by FDA under an Emergency Use Authorization (EUA). This EUA will remain in effect (meaning this test can be used) for the duration of the COVID-19 declaration under Section 564(b)(1) of the Act, 21 U.S.C. section 360bbb-3(b)(1), unless the authorization is terminated or revoked.  Performed at [facility], [address] [place], [place] [address]   Blood Culture (routine x 2)     Status: None (Preliminary result)   Collection Time: 10/17/24  8:48 AM   Specimen: BLOOD  Result Value Ref Range Status   Specimen Description   Final    BLOOD BLOOD RIGHT FOREARM Performed at [facility], [address] [place], [place] [address]    Special Requests   Final    BOTTLES DRAWN AEROBIC AND ANAEROBIC Blood Culture results may not be optimal due to an inadequate volume of blood received in culture bottles Performed at [facility], [address]  [place],  [place] [address]    Culture   Final    NO GROWTH 4 DAYS Performed at [facility], [address], [place], [place] [address]    Report Status PENDING  Incomplete  Blood Culture (routine x 2)     Status: None (Preliminary result)   Collection Time: 10/17/24  8:56 AM   Specimen: BLOOD  Result Value Ref Range Status   Specimen Description   Final    BLOOD RIGHT ANTECUBITAL Performed at [facility], [address], [place], [place] [address]    Special Requests   Final    BOTTLES DRAWN AEROBIC AND ANAEROBIC Blood Culture adequate volume Performed at [facility], [address] [place], [place] [address]    Culture   Final    NO GROWTH 4 DAYS Performed at [facility], [address] [place], [place] [address]    Report Status PENDING  Incomplete  Urine Culture (for pregnant, neutropenic or urologic patients or patients with an indwelling urinary catheter)     Status: Abnormal   Collection Time: 10/17/24  9:25 AM   Specimen: Urine, Clean Catch  Result Value Ref Range Status   Specimen Description   Final    URINE, CLEAN CATCH Performed at [facility], [address], [place], [place] [address]    Special Requests   Final    NONE Performed at [facility], [address], [place], [place] [address]    Culture (A)  Final    30,000 COLONIES/mL PSEUDOMONAS AERUGINOSA 30,000 COLONIES/mL ENTEROCOCCUS FAECIUM    Report Status 10/21/2024 FINAL  Final   Organism ID, Bacteria PSEUDOMONAS AERUGINOSA (A)  Final   Organism ID, Bacteria ENTEROCOCCUS FAECIUM (A)  Final      Susceptibility   Enterococcus faecium - MIC*    AMPICILLIN  <=2 SENSITIVE Sensitive     NITROFURANTOIN 64 INTERMEDIATE Intermediate     VANCOMYCIN  <=0.5 SENSITIVE Sensitive     * 30,000 COLONIES/mL ENTEROCOCCUS FAECIUM   Pseudomonas aeruginosa - MIC*    MEROPENEM <=0.25 SENSITIVE Sensitive     CIPROFLOXACIN  0.5 SENSITIVE  Sensitive     IMIPENEM 1 SENSITIVE Sensitive     PIP/TAZO Value in next row Sensitive      8 SENSITIVEThis is a modified FDA-approved test that has been validated and its performance characteristics determined by the reporting laboratory.  This laboratory is certified under the Clinical Laboratory Improvement Amendments CLIA as qualified to perform high complexity clinical laboratory testing.    CEFTAZIDIME/AVIBACTAM Value in next row Sensitive      8 SENSITIVEThis is a modified FDA-approved test that has been validated and its performance characteristics determined by the reporting laboratory.  This laboratory is certified under the Clinical Laboratory Improvement Amendments CLIA as qualified to perform high complexity clinical laboratory testing.    CEFTOLOZANE/TAZOBACTAM Value in next row Sensitive      8 SENSITIVEThis is a modified FDA-approved test that has been validated and its performance characteristics determined by the reporting laboratory.  This laboratory is certified under the Clinical Laboratory Improvement Amendments CLIA as qualified to perform high complexity clinical laboratory testing.    TOBRAMYCIN Value in next row Sensitive      8 SENSITIVEThis is a modified FDA-approved test that has been validated and its performance characteristics determined by the reporting laboratory.  This laboratory is certified under the Clinical Laboratory Improvement Amendments CLIA as qualified to perform high complexity clinical laboratory testing.  CEFTAZIDIME Value in next row Sensitive      8 SENSITIVEThis is a modified FDA-approved test that has been validated and its performance characteristics determined by the reporting laboratory.  This laboratory is certified under the Clinical Laboratory Improvement Amendments CLIA as qualified to perform high complexity clinical laboratory testing.    * 30,000 COLONIES/mL PSEUDOMONAS AERUGINOSA  C Difficile Quick Screen w PCR reflex     Status: None    Collection Time: 10/18/24 12:47 PM   Specimen: STOOL  Result Value Ref Range Status   C Diff antigen NEGATIVE NEGATIVE Final   C Diff toxin NEGATIVE NEGATIVE Final   C Diff interpretation No C. difficile detected.  Final    Comment: Performed at [facility], [address] [place], [place] [address]  Gastrointestinal Panel by PCR , Stool     Status: Abnormal   Collection Time: 10/18/24 12:47 PM   Specimen: STOOL  Result Value Ref Range Status   Campylobacter species NOT DETECTED NOT DETECTED Final   Plesimonas shigelloides NOT DETECTED NOT DETECTED Final   Salmonella species NOT DETECTED NOT DETECTED Final   Yersinia enterocolitica NOT DETECTED NOT DETECTED Final   Vibrio species NOT DETECTED NOT DETECTED Final   Vibrio cholerae NOT DETECTED NOT DETECTED Final   Enteroaggregative E coli (EAEC) NOT DETECTED NOT DETECTED Final   Enteropathogenic E coli (EPEC) NOT DETECTED NOT DETECTED Final   Enterotoxigenic E coli (ETEC) DETECTED (A) NOT DETECTED Final    Comment: RESULT CALLED TO, READ BACK BY AND VERIFIED WITH: HILLARY MASHBURN 10/20/24 1219 MU    Shiga like toxin producing E coli (STEC) NOT DETECTED NOT DETECTED Final   Shigella/Enteroinvasive E coli (EIEC) NOT DETECTED NOT DETECTED Final   Cryptosporidium NOT DETECTED NOT DETECTED Final   Cyclospora cayetanensis NOT DETECTED NOT DETECTED Final   Entamoeba histolytica NOT DETECTED NOT DETECTED Final   Giardia lamblia NOT DETECTED NOT DETECTED Final   Adenovirus F40/41 NOT DETECTED NOT DETECTED Final   Astrovirus NOT DETECTED NOT DETECTED Final   Norovirus GI/GII NOT DETECTED NOT DETECTED Final   Rotavirus A NOT DETECTED NOT DETECTED Final   Sapovirus (I, II, IV, and V) NOT DETECTED NOT DETECTED Final    Comment: Performed at [facility], [address], [place], [place] [address]         Radiology Studies: CT FOOT RIGHT WO CONTRAST Result Date: 10/20/2024 CLINICAL DATA:  Foot pain.  Recent  fall. EXAM: CT OF THE RIGHT FOOT WITHOUT CONTRAST TECHNIQUE: Multidetector CT imaging of the right foot was performed according to the standard protocol. Multiplanar CT image reconstructions were also generated. RADIATION DOSE REDUCTION: This exam was performed according to the departmental dose-optimization program which includes automated exposure control, adjustment of the mA and/or kV according to patient size and/or use of iterative reconstruction technique. COMPARISON:  Right foot radiographs dated 10/19/2024. FINDINGS: Bones/Joint/Cartilage Diffuse osseous demineralization. Postsurgical changes at the base of the first metatarsal with 2 fractured cannulated partially threaded screws, one of which demonstrates up to 5 mm of displacement (series 5, images 101-103). No appreciable acute fracture involving the visualized bones. No dislocation. Well corticated ossicle at the dorsal aspect of the Lisfranc joint. Mild-to-moderate osteoarthritis throughout the hindfoot and midfoot, most pronounced at the calcaneocuboid, talonavicular, first and second TMT joints. Hallux valgus with mild-to-moderate degenerative changes of the first MTP joint. Moderate degenerative changes at the first metatarsal head-hallux sesamoid articulation. Plantar calcaneal spur. Ligaments Ligaments are suboptimally evaluated by CT. Muscles  and Tendons Thickening of the distal Achilles tendon, which may reflect tendinosis. Foci of calcification within the distal Achilles tendon may relate to prior trauma. Generalized atrophy of the musculature. Soft tissue No fluid collection or hematoma. IMPRESSION: 1. Postsurgical changes at the base of the first metatarsal with 2 fractured screws, one of which demonstrates up to 5 mm of displacement. 2. No appreciable acute fracture involving the visualized bones. No dislocation. 3. Hallux valgus with mild-to-moderate degenerative changes of the first MTP joint. 4. Moderate degenerative changes at the first  metatarsal head-hallux sesamoid articulation. 5. Mild-to-moderate osteoarthritis throughout the hindfoot and midfoot. Well corticated ossicle at the dorsal aspect of the Lisfranc joint. 6. Thickening of the distal Achilles tendon may reflect tendinosis. Foci of calcification within the distal Achilles tendon may relate to prior trauma. Electronically Signed   By: Harrietta Sherry M.D.   On: 10/20/2024 14:10        Scheduled Meds:  aspirin  EC  325 mg Oral Daily   budesonide -glycopyrrolate -formoterol   2 puff Inhalation BID   buPROPion   150 mg Oral Daily   enoxaparin  (LOVENOX ) injection  40 mg Subcutaneous Q24H   escitalopram   20 mg Oral Daily   fluticasone   1 spray Each Nare Daily   gabapentin   100 mg Oral BID   gabapentin   300 mg Oral QHS   levothyroxine   50 mcg Oral QAC breakfast   LORazepam   1 mg Oral QHS   melatonin  5 mg Oral QHS   montelukast   10 mg Oral QHS   pantoprazole   40 mg Oral Daily   vancomycin   125 mg Oral BID   verapamil   360 mg Oral Daily   Continuous Infusions:     LOS: 4 days    Time spent: 40 minutes    Toribio Hummer, MD [facility]   To contact the attending provider between 7A-7P or the covering provider during after hours 7P-7A, please log into the web site [email or web address] and access using universal [place] password for that web site. If you do not have the password, please call the hospital operator.  10/21/2024, 2:19 PM    "

## 2024-10-21 NOTE — Evaluation (Signed)
 Physical Therapy Evaluation Patient Details Name: Courtney [name] MRN: [id number] DOB: [birth year] Today's Date: 10/21/2024  History of Present Illness  Patient is a 79 y/o female admitted 10/17/24 with fall at home and near syncope with N/V/D and concern for sepsis with hypokalemia and AKI with UTI. PMH: recent bladder stimulator implant on 6/20, CVA, Asthma, HTN, HLD, right side weakness, depression/anxiety, OSA, hiatal hernia, MVP, OSA on CPAP, CVA with R hemiparesis, asthma and anemia.  Clinical Impression  Patient presents with decreased mobility due to pain, decreased balance, generalized weakness and decreased activity tolerance.  She was previously mobilizing with rollator and wheelchair at home with spouse helping for shower transfer.  She needed mod to max A today to stand from EOB and step to recliner with knees buckling and pain R foot.  Patient will benefit from skilled PT in the acute setting and from post-acute inpatient rehab (<3 hours/day) prior to d/c home.         If plan is discharge home, recommend the following: Two people to help with walking and/or transfers;Two people to help with bathing/dressing/bathroom;Assistance with cooking/housework;Assist for transportation;Help with stairs or ramp for entrance   Can travel by private vehicle   No    Equipment Recommendations None recommended by PT  Recommendations for Other Services       Functional Status Assessment Patient has had a recent decline in their functional status and demonstrates the ability to make significant improvements in function in a reasonable and predictable amount of time.     Precautions / Restrictions Precautions Precautions: Fall Recall of Precautions/Restrictions: Intact Required Braces or Orthoses: Other Brace Other Brace: post op shoe on R Restrictions Weight Bearing Restrictions Per Provider Order: Yes RLE Weight Bearing Per Provider Order: Weight bearing as tolerated      Mobility   Bed Mobility Overal bed mobility: Needs Assistance Bed Mobility: Supine to Sit     Supine to sit: Mod assist, HOB elevated, Used rails     General bed mobility comments: increased time, assist for scooting hips and cues for hand placement for lifting trunk with HHA    Transfers Overall transfer level: Needs assistance Equipment used: Rolling walker (2 wheels) Transfers: Sit to/from Stand, Bed to chair/wheelchair/BSC Sit to Stand: From elevated surface, Mod assist, Max assist   Step pivot transfers: Mod assist       General transfer comment: initial posterior bias, cues for hand placement and lifting help to stand, assist for stepping with RW with initial support for anterior weight shift and noted R knee buckle so brought up recliner for pt to sit, RN in the room to assist to scoot back in chair    Ambulation/Gait                  Stairs            Wheelchair Mobility     Tilt Bed    Modified Rankin (Stroke Patients Only)       Balance Overall balance assessment: Needs assistance Sitting-balance support: Feet supported Sitting balance-Leahy Scale: Fair Sitting balance - Comments: though leaning posterior no LOB   Standing balance support: Bilateral upper extremity supported Standing balance-Leahy Scale: Poor Standing balance comment: mod A for balance in standing with UE support                             Pertinent Vitals/Pain Pain Assessment Pain Assessment: 0-10 Pain Score: 5  Pain Location: R foot when elevated, 9 when standing Pain Descriptors / Indicators: Aching, Discomfort, Grimacing Pain Intervention(s): Monitored during session, Repositioned, Limited activity within patient's tolerance    Home Living Family/patient expects to be discharged to:: Private residence Living Arrangements: Spouse/significant other Available Help at Discharge: Family;Available 24 hours/day Type of Home: Independent living facility Home Access:  Level entry       Home Layout: One level Home Equipment: Rollator (4 wheels);Grab bars - toilet;Grab bars - tub/shower;Shower seat;Cane - single point;Cane - quad;Transport chair;Wheelchair - manual      Prior Function Prior Level of Function : Needs assist             Mobility Comments: spouse reports had fall in ED when legs buckled, states this happens at times at home as well with falls in July and September 2025       Extremity/Trunk Assessment   Upper Extremity Assessment Upper Extremity Assessment: LUE deficits/detail LUE Deficits / Details: ecchymosis and edema on dorsum of hand with pt report fell on it able to grip and use it though painful    Lower Extremity Assessment Lower Extremity Assessment: RLE deficits/detail;LLE deficits/detail RLE Deficits / Details: painful with ankle DF/PF and numbness/tingling throughout foot and lower leg to light touch, able to lift leg antigravity and flex the knee though limited knee extension with strength 4-/5 LLE Deficits / Details: AROM grossly WFL, strength hip flexion 3+/5, knee extension 4-/5    Cervical / Trunk Assessment Cervical / Trunk Assessment: Kyphotic  Communication   Communication Communication: No apparent difficulties    Cognition Arousal: Alert Behavior During Therapy: WFL for tasks assessed/performed   PT - Cognitive impairments: No apparent impairments                         Following commands: Intact       Cueing Cueing Techniques: Verbal cues     General Comments General comments (skin integrity, edema, etc.): spouse present with written history to relate when last falls were.  Patient on RA upon entry with SpO2 86% so applied 2L O2 and up to 92%, RN aware and with audible wheeze    Exercises     Assessment/Plan    PT Assessment Patient needs continued PT services  PT Problem List Decreased activity tolerance;Decreased mobility;Decreased strength;Decreased balance;Cardiopulmonary  status limiting activity       PT Treatment Interventions DME instruction;Gait training;Therapeutic activities;Functional mobility training;Balance training;Therapeutic exercise;Wheelchair mobility training;Patient/family education    PT Goals (Current goals can be found in the Care Plan section)  Acute Rehab PT Goals Patient Stated Goal: get stronger prior to going home PT Goal Formulation: With patient/family Time For Goal Achievement: 11/04/24 Potential to Achieve Goals: Good    Frequency Min 2X/week     Co-evaluation               AM-PAC PT 6 Clicks Mobility  Outcome Measure Help needed turning from your back to your side while in a flat bed without using bedrails?: A Lot Help needed moving from lying on your back to sitting on the side of a flat bed without using bedrails?: Total Help needed moving to and from a bed to a chair (including a wheelchair)?: A Lot Help needed standing up from a chair using your arms (e.g., wheelchair or bedside chair)?: Total Help needed to walk in hospital room?: Total Help needed climbing 3-5 steps with a railing? : Total 6 Click Score: 8  End of Session Equipment Utilized During Treatment: Gait belt;Oxygen Activity Tolerance: Patient limited by pain;Patient limited by fatigue Patient left: in chair;with call bell/phone within reach;with chair alarm set Nurse Communication: Mobility status PT Visit Diagnosis: Other abnormalities of gait and mobility (R26.89);Muscle weakness (generalized) (M62.81);Pain Pain - Right/Left: Right Pain - part of body: Ankle and joints of foot    Time: 1240-1310 PT Time Calculation (min) (ACUTE ONLY): 30 min   Charges:   PT Evaluation $PT Eval Moderate Complexity: 1 Mod PT Treatments $Therapeutic Activity: 8-22 mins PT General Charges $$ ACUTE PT VISIT: 1 Visit         Micheline Portal, PT Acute Rehabilitation Services Office:[phone] 10/21/2024   Montie Portal 10/21/2024, 1:53 PM

## 2024-10-21 NOTE — [place] FL2 (Signed)
 " [place]  MEDICAID FL2 LEVEL OF CARE FORM     IDENTIFICATION  Patient Name: Courtney [name] Birthdate: [birth year] Sex: female Admission Date (Current Location): 10/17/2024  [facility] and [place] Number:  Producer, Television/film/video and Address:  [facility],  [address], [place] [address]      Provider Number: [id number]  Attending Physician Name and Address:  Sebastian Toribio GAILS, MD  Relative Name and Phone Number:  Candie Gintz (spouse) Ph: [phone]    Current Level of Care: Hospital Recommended Level of Care: Skilled Nursing Facility Prior Approval Number:    Date Approved/Denied:   PASRR Number: [id number]  Discharge Plan: SNF    Current Diagnoses: Patient Active Problem List   Diagnosis Date Noted   Intestinal infection due to enterotoxigenic E. coli 10/20/2024   Sepsis due to undetermined organism (HCC) 10/17/2024   Acute on chronic colitis 10/17/2024   Leg edema 10/06/2024   Acute kidney injury 04/11/2024   Moderate persistent asthma with acute exacerbation 04/11/2024   Right hemiparesis (HCC) 02/06/2022   Chronic ulcerative pancolitis (HCC) 02/06/2022   Anxiety 01/02/2022   Chest pain 01/02/2022   Moderate recurrent major depression and anxiety  01/02/2022   History of CVA (cerebrovascular accident) 01/02/2022   Urinary tract infection symptoms 01/02/2022   Diverticulitis 12/07/2021   Acute diverticulitis of intestine 12/07/2021   Diarrhea    Hypokalemia 11/20/2021   COVID-19 infection with colitis 11/18/2021   CAD (coronary artery disease) 11/18/2021   Colitis with rectal bleeding 11/18/2021   AKI (acute kidney injury) 11/18/2021   Hypertension 04/02/2021   Hyponatremia 04/02/2021   Dyslipidemia 04/02/2021   Hypothyroidism (acquired) 04/02/2021   OSA (obstructive sleep apnea) 04/02/2021   Cervical spondylosis with myelopathy and radiculopathy 03/28/2021   Idiopathic peripheral neuropathy 11/02/2019   Anemia 12/12/2017     Orientation RESPIRATION BLADDER Height & Weight     Self, Time, Situation, Place  O2 (2L/min) Incontinent Weight: 195 lb (88.5 kg) Height:  5' 2 (157.5 cm)  BEHAVIORAL SYMPTOMS/MOOD NEUROLOGICAL BOWEL NUTRITION STATUS      Incontinent Diet (Soft diet)  AMBULATORY STATUS COMMUNICATION OF NEEDS Skin   Extensive Assist Verbally Other (Comment) (Erythema: buttocks, right foot; Ecchymosis: left arm, hand, leg, and buttock)                       Personal Care Assistance Level of Assistance  Bathing, Feeding, Dressing Bathing Assistance: Maximum assistance Feeding assistance: Independent Dressing Assistance: Maximum assistance     Functional Limitations Info  Sight, Hearing, Speech Sight Info: Impaired Hearing Info: Adequate Speech Info: Adequate    SPECIAL CARE FACTORS FREQUENCY  PT (By licensed PT), OT (By licensed OT)     PT Frequency: 5x's/week OT Frequency: 5x's/week            Contractures Contractures Info: Not present    Additional Factors Info  Code Status, Allergies Code Status Info: Full Allergies Info: Tetanus Toxoid-containing Vaccines, Tetanus-diphtheria Toxoids Td, Meloxicam, Phentermine, Sulfa Antibiotics, Zithromax (Azithromycin)           Current Medications (10/21/2024):  This is the current hospital active medication list Current Facility-Administered Medications  Medication Dose Route Frequency Provider Last Rate Last Admin   acetaminophen  (TYLENOL ) tablet 650 mg  650 mg Oral Q6H PRN Celinda Alm Lot, MD   650 mg at 10/21/24 9075   Or   acetaminophen  (TYLENOL ) suppository 650 mg  650 mg Rectal Q6H PRN Celinda Alm Lot, MD  albuterol  (PROVENTIL ) (2.5 MG/3ML) 0.083% nebulizer solution 2.5 mg  2.5 mg Nebulization Q4H PRN Fausto Burnard LABOR, DO       aspirin  EC tablet 325 mg  325 mg Oral Daily Sebastian Toribio GAILS, MD   325 mg at 10/21/24 9144   benzonatate  (TESSALON ) capsule 200 mg  200 mg Oral TID PRN Fausto Burnard A, DO   200 mg  at 10/21/24 9075   budesonide -glycopyrrolate -formoterol  (BREZTRI ) 160-9-4.8 MCG/ACT inhaler 2 puff  2 puff Inhalation BID Fausto Burnard A, DO   2 puff at 10/21/24 0857   buPROPion  (WELLBUTRIN  SR) 12 hr tablet 150 mg  150 mg Oral Daily Fausto Burnard A, DO   150 mg at 10/21/24 9143   diclofenac  Sodium (VOLTAREN ) 1 % topical gel 2 g  2 g Topical QID PRN Fausto Burnard A, DO       enoxaparin  (LOVENOX ) injection 40 mg  40 mg Subcutaneous Q24H Celinda Alm Lot, MD   40 mg at 10/20/24 2150   escitalopram  (LEXAPRO ) tablet 20 mg  20 mg Oral Daily Fausto Burnard A, DO   20 mg at 10/21/24 0857   fluticasone  (FLONASE ) 50 MCG/ACT nasal spray 1 spray  1 spray Each Nare Daily Fausto Burnard A, DO   1 spray at 10/21/24 9140   gabapentin  (NEURONTIN ) capsule 100 mg  100 mg Oral BID Fausto Burnard A, DO   100 mg at 10/21/24 9145   gabapentin  (NEURONTIN ) capsule 300 mg  300 mg Oral QHS Celinda Alm Lot, MD   300 mg at 10/20/24 2149   levothyroxine  (SYNTHROID ) tablet 50 mcg  50 mcg Oral QAC breakfast Celinda Alm Lot, MD   50 mcg at 10/21/24 0530   LORazepam  (ATIVAN ) tablet 1 mg  1 mg Oral QHS Fausto Burnard A, DO   1 mg at 10/20/24 2149   melatonin tablet 5 mg  5 mg Oral QHS Fausto Burnard A, DO   5 mg at 10/20/24 2149   montelukast  (SINGULAIR ) tablet 10 mg  10 mg Oral QHS Celinda Alm Lot, MD   10 mg at 10/20/24 2149   ondansetron  (ZOFRAN ) tablet 4 mg  4 mg Oral Q6H PRN Celinda Alm Lot, MD       Or   ondansetron  (ZOFRAN ) injection 4 mg  4 mg Intravenous Q6H PRN Celinda Alm Lot, MD       pantoprazole  (PROTONIX ) EC tablet 40 mg  40 mg Oral Daily Celinda Alm Lot, MD   40 mg at 10/21/24 0856   phenol (CHLORASEPTIC) mouth spray 1 spray  1 spray Mouth/Throat PRN Fausto Burnard A, DO   1 spray at 10/20/24 9360   vancomycin  (VANCOCIN ) capsule 125 mg  125 mg Oral BID Vu, Trung T, MD       verapamil  (CALAN -SR) CR tablet 360 mg  360 mg Oral Daily Celinda Alm Lot, MD   360 mg at 10/21/24  9144     Discharge Medications: Please see discharge summary for a list of discharge medications.  Relevant Imaging Results:  Relevant Lab Results:   Additional Information SSN: [id number]  Duwaine GORMAN Aran, LCSW     "

## 2024-10-21 NOTE — Progress Notes (Signed)
 Orthopedic Tech Progress Note Patient Details:  Courtney [name] February 15, 1946 [id number]  Ortho Devices Type of Ortho Device: Postop shoe/boot Ortho Device/Splint Location: right . use when OOB Ortho Device/Splint Interventions: Ordered, Application, Adjustment   Post Interventions Patient Tolerated: Well Instructions Provided: Adjustment of device, Care of device  [name] 10/21/2024, 10:11 AM

## 2024-10-21 NOTE — Consult Note (Signed)
 "        [facility]    Date of Admission:  10/17/2024     Reason for Consult: colitis    Referring Provider: Thompson      Abx: 12/31 cipro  12/30-31 cefepime  12/28-30 ceftriaxone  12/28 metronidazole  12/28-1/1 po vanc         Assessment: 79 yo female CAD, bipolar, asthma, nephrolithiasis, mv prolapse, osa on cpap, hx pe, hx cva residual right sided hemiparesis, multiple cdiff episode, on macrobid for uti prophylaxis, admitted 12/28 for 1 day watery diarrhea and also a ground level fall  Ct showed colitis Low grade temp 100.7 on presentation and wbc 26 on 12/28 --> 13 on 12/30 (no repeat in between)  Stool cdiff negative; gi pcr with etec   Ecoli colitis severe given count of diarrhea s/p more than adequate tx. Given hx prior cdiff will continue prophy dosing for 7 more days    Plan: Stop systemic abx Continue bid po vanc 125 mg for 7 more days until 10/28/24 Maintain standard enteric isolation precaution  Consider stopping nexium if patient doensn't have PUD or barrett's esophagus as it is a high risk medication for cdiff Id team will sign off Discharge when ready per primary team Discussed with trh dr Sebastian     ------------------------------------------------ Principal Problem:   Sepsis due to undetermined organism [facility]) Active Problems:   Hypertension   Dyslipidemia   OSA (obstructive sleep apnea)   CAD (coronary artery disease)   AKI (acute kidney injury)   Hypokalemia   Acute on chronic colitis   Intestinal infection due to enterotoxigenic E. coli    HPI: Courtney [name] is a 79 y.o. female CAD, bipolar, asthma, nephrolithiasis, mv prolapse, osa on cpap, hx pe, hx cva residual right sided hemiparesis, multiple cdiff episode, admitted 12/28 for 1 day watery diarrhea and also a ground level fall  Patient has been taking macrobid for uti prophylaxis  Sx of diarrhea onset quick within 24 hrs admission without abd  pain/n/v. No recent travel Lives with husband who is well Diarrhea watery and nonbloody 6 bm a day  Low grade temp on admission with wbc 26 which normalized rather quickly within 24 hrs Gi pcr and cdiff done ; former shows etec Started on bsAbx along with cdiff po vanc  Multiple imaging no osseous fracture  Today no bm yet Feels well     Family History  Problem Relation Age of Onset   Stroke Father    Hyperlipidemia Father    Esophageal cancer Maternal Uncle    Breast cancer Paternal Aunt        42s   CAD Neg Hx     Social History[1]  Allergies[2]  Review of Systems: ROS All Other ROS was negative, except mentioned above   Past Medical History:  Diagnosis Date   Anemia    Aortic atherosclerosis    Arthritis    Bilateral Wrists   Asthma    Bipolar disorder (HCC)    Coronary artery calcification    Depression    Diverticulitis    Headache    Hiatal hernia    History of kidney stones 2006   Hypertension    Hypothyroidism (acquired) 04/02/2021   Kidney stone    Mitral valve prolapse    OSA (obstructive sleep apnea) 04/02/2021   wears CPAP   Panic attack    PONV (postoperative nausea and vomiting)    Pulmonary embolus (HCC)    Stroke (HCC)  Thrombophlebitis    Vertigo        Scheduled Meds:  aspirin  EC  325 mg Oral Daily   budesonide -glycopyrrolate -formoterol   2 puff Inhalation BID   buPROPion   150 mg Oral Daily   enoxaparin  (LOVENOX ) injection  40 mg Subcutaneous Q24H   escitalopram   20 mg Oral Daily   fluticasone   1 spray Each Nare Daily   gabapentin   100 mg Oral BID   gabapentin   300 mg Oral QHS   levothyroxine   50 mcg Oral QAC breakfast   LORazepam   1 mg Oral QHS   melatonin  5 mg Oral QHS   montelukast   10 mg Oral QHS   pantoprazole   40 mg Oral Daily   verapamil   360 mg Oral Daily   Continuous Infusions: PRN Meds:.acetaminophen  **OR** acetaminophen , albuterol , benzonatate , diclofenac  Sodium, ondansetron  **OR** ondansetron  (ZOFRAN )  IV, phenol   OBJECTIVE: Blood pressure (!) 147/109, pulse 73, temperature 98.7 F (37.1 C), temperature source Oral, resp. rate 19, height 5' 2 (1.575 m), weight 88.5 kg, SpO2 94%.  Physical Exam  General/constitutional: no distress, pleasant HEENT: Normocephalic, PER, Conj Clear, EOMI, Oropharynx clear Neck supple CV: rrr no mrg Lungs: clear to auscultation, normal respiratory effort Abd: Soft, Nontender Ext: no edema Skin: No Rash Neuro: nonfocal MSK: no joint swelling peripherally; echymoses rle     Lab Results Lab Results  Component Value Date   WBC 9.6 10/21/2024   HGB 10.0 (L) 10/21/2024   HCT 31.3 (L) 10/21/2024   MCV 93.2 10/21/2024   PLT 147 (L) 10/21/2024    Lab Results  Component Value Date   CREATININE 0.58 10/21/2024   BUN 6 (L) 10/21/2024   NA 137 10/21/2024   K 3.6 10/21/2024   CL 100 10/21/2024   CO2 29 10/21/2024    Lab Results  Component Value Date   ALT 18 10/17/2024   AST 35 10/17/2024   ALKPHOS 144 (H) 10/17/2024   BILITOT 0.4 10/17/2024      Microbiology: Recent Results (from the past 240 hours)  Resp panel by RT-PCR (RSV, Flu A&B, Covid) Anterior Nasal Swab     Status: None   Collection Time: 10/17/24  8:04 AM   Specimen: Anterior Nasal Swab  Result Value Ref Range Status   SARS Coronavirus 2 by RT PCR NEGATIVE NEGATIVE Final    Comment: (NOTE) SARS-CoV-2 target nucleic acids are NOT DETECTED.  The SARS-CoV-2 RNA is generally detectable in upper respiratory specimens during the acute phase of infection. The lowest concentration of SARS-CoV-2 viral copies this assay can detect is 138 copies/mL. A negative result does not preclude SARS-Cov-2 infection and should not be used as the sole basis for treatment or other patient management decisions. A negative result may occur with  improper specimen collection/handling, submission of specimen other than nasopharyngeal swab, presence of viral mutation(s) within the areas targeted by  this assay, and inadequate number of viral copies(<138 copies/mL). A negative result must be combined with clinical observations, patient history, and epidemiological information. The expected result is Negative.  Fact Sheet for Patients:  bloggercourse.com  Fact Sheet for Healthcare Providers:  seriousbroker.it  This test is no t yet approved or cleared by the [place]  FDA and  has been authorized for detection and/or diagnosis of SARS-CoV-2 by FDA under an Emergency Use Authorization (EUA). This EUA will remain  in effect (meaning this test can be used) for the duration of the COVID-19 declaration under Section 564(b)(1) of the Act, 21 U.S.C.section 360bbb-3(b)(1), unless the  authorization is terminated  or revoked sooner.       Influenza A by PCR NEGATIVE NEGATIVE Final   Influenza B by PCR NEGATIVE NEGATIVE Final    Comment: (NOTE) The Xpert Xpress SARS-CoV-2/FLU/RSV plus assay is intended as an aid in the diagnosis of influenza from Nasopharyngeal swab specimens and should not be used as a sole basis for treatment. Nasal washings and aspirates are unacceptable for Xpert Xpress SARS-CoV-2/FLU/RSV testing.  Fact Sheet for Patients: bloggercourse.com  Fact Sheet for Healthcare Providers: seriousbroker.it  This test is not yet approved or cleared by the [place]  FDA and has been authorized for detection and/or diagnosis of SARS-CoV-2 by FDA under an Emergency Use Authorization (EUA). This EUA will remain in effect (meaning this test can be used) for the duration of the COVID-19 declaration under Section 564(b)(1) of the Act, 21 U.S.C. section 360bbb-3(b)(1), unless the authorization is terminated or revoked.     Resp Syncytial Virus by PCR NEGATIVE NEGATIVE Final    Comment: (NOTE) Fact Sheet for Patients: bloggercourse.com  Fact Sheet  for Healthcare Providers: seriousbroker.it  This test is not yet approved or cleared by the [place]  FDA and has been authorized for detection and/or diagnosis of SARS-CoV-2 by FDA under an Emergency Use Authorization (EUA). This EUA will remain in effect (meaning this test can be used) for the duration of the COVID-19 declaration under Section 564(b)(1) of the Act, 21 U.S.C. section 360bbb-3(b)(1), unless the authorization is terminated or revoked.  Performed at [facility], [address] [place], [place] [address]   Blood Culture (routine x 2)     Status: None (Preliminary result)   Collection Time: 10/17/24  8:48 AM   Specimen: BLOOD  Result Value Ref Range Status   Specimen Description   Final    BLOOD BLOOD RIGHT FOREARM Performed at [facility], [address], [place], [place] [address]    Special Requests   Final    BOTTLES DRAWN AEROBIC AND ANAEROBIC Blood Culture results may not be optimal due to an inadequate volume of blood received in culture bottles Performed at [facility], [address], [place], [place] [address]    Culture   Final    NO GROWTH 4 DAYS Performed at [facility], [address] [place], [place] [address]    Report Status PENDING  Incomplete  Blood Culture (routine x 2)     Status: None (Preliminary result)   Collection Time: 10/17/24  8:56 AM   Specimen: BLOOD  Result Value Ref Range Status   Specimen Description   Final    BLOOD RIGHT ANTECUBITAL Performed at [facility], [address], [place], [place] [address]    Special Requests   Final    BOTTLES DRAWN AEROBIC AND ANAEROBIC Blood Culture adequate volume Performed at [facility], [address], [place], [place] [address]    Culture   Final    NO GROWTH 4 DAYS Performed at [facility], [address] [place], [place] [address]    Report  Status PENDING  Incomplete  Urine Culture (for pregnant, neutropenic or urologic patients or patients with an indwelling urinary catheter)     Status: Abnormal   Collection Time: 10/17/24  9:25 AM   Specimen: Urine, Clean Catch  Result Value Ref Range Status   Specimen Description   Final    URINE, CLEAN CATCH Performed at [facility], [address] [place], [place] [address]    Special Requests   Final    NONE Performed at [facility], [address] [place], [place] [address]    Culture (A)  Final    30,000 COLONIES/mL PSEUDOMONAS AERUGINOSA 30,000 COLONIES/mL ENTEROCOCCUS FAECIUM    Report Status 10/21/2024 FINAL  Final   Organism ID, Bacteria PSEUDOMONAS AERUGINOSA (A)  Final   Organism ID, Bacteria ENTEROCOCCUS FAECIUM (A)  Final      Susceptibility   Enterococcus faecium - MIC*    AMPICILLIN  <=2 SENSITIVE Sensitive     NITROFURANTOIN 64 INTERMEDIATE Intermediate     VANCOMYCIN  <=0.5 SENSITIVE Sensitive     * 30,000 COLONIES/mL ENTEROCOCCUS FAECIUM   Pseudomonas aeruginosa - MIC*    MEROPENEM <=0.25 SENSITIVE Sensitive     CIPROFLOXACIN  0.5 SENSITIVE Sensitive     IMIPENEM 1 SENSITIVE Sensitive     PIP/TAZO Value in next row Sensitive      8 SENSITIVEThis is a modified FDA-approved test that has been validated and its performance characteristics determined by the reporting laboratory.  This laboratory is certified under the Clinical Laboratory Improvement Amendments CLIA as qualified to perform high complexity clinical laboratory testing.    CEFTAZIDIME/AVIBACTAM Value in next row Sensitive      8 SENSITIVEThis is a modified FDA-approved test that has been validated and its performance characteristics determined by the reporting laboratory.  This laboratory is certified under the Clinical Laboratory Improvement Amendments CLIA as qualified to perform high complexity clinical laboratory testing.    CEFTOLOZANE/TAZOBACTAM Value in next  row Sensitive      8 SENSITIVEThis is a modified FDA-approved test that has been validated and its performance characteristics determined by the reporting laboratory.  This laboratory is certified under the Clinical Laboratory Improvement Amendments CLIA as qualified to perform high complexity clinical laboratory testing.    TOBRAMYCIN Value in next row Sensitive      8 SENSITIVEThis is a modified FDA-approved test that has been validated and its performance characteristics determined by the reporting laboratory.  This laboratory is certified under the Clinical Laboratory Improvement Amendments CLIA as qualified to perform high complexity clinical laboratory testing.    CEFTAZIDIME Value in next row Sensitive      8 SENSITIVEThis is a modified FDA-approved test that has been validated and its performance characteristics determined by the reporting laboratory.  This laboratory is certified under the Clinical Laboratory Improvement Amendments CLIA as qualified to perform high complexity clinical laboratory testing.    * 30,000 COLONIES/mL PSEUDOMONAS AERUGINOSA  C Difficile Quick Screen w PCR reflex     Status: None   Collection Time: 10/18/24 12:47 PM   Specimen: STOOL  Result Value Ref Range Status   C Diff antigen NEGATIVE NEGATIVE Final   C Diff toxin NEGATIVE NEGATIVE Final   C Diff interpretation No C. difficile detected.  Final    Comment: Performed at [facility], [address], [place], [place] [address]  Gastrointestinal Panel by PCR , Stool     Status: Abnormal   Collection Time: 10/18/24 12:47 PM   Specimen: STOOL  Result Value Ref Range Status   Campylobacter species NOT DETECTED NOT DETECTED Final   Plesimonas shigelloides NOT DETECTED NOT DETECTED Final   Salmonella species NOT DETECTED NOT DETECTED Final   Yersinia enterocolitica NOT DETECTED NOT DETECTED Final   Vibrio species NOT DETECTED NOT DETECTED Final   Vibrio cholerae NOT DETECTED NOT DETECTED Final    Enteroaggregative E  coli (EAEC) NOT DETECTED NOT DETECTED Final   Enteropathogenic E coli (EPEC) NOT DETECTED NOT DETECTED Final   Enterotoxigenic E coli (ETEC) DETECTED (A) NOT DETECTED Final    Comment: RESULT CALLED TO, READ BACK BY AND VERIFIED WITH: HILLARY MASHBURN 10/20/24 1219 MU    Shiga like toxin producing E coli (STEC) NOT DETECTED NOT DETECTED Final   Shigella/Enteroinvasive E coli (EIEC) NOT DETECTED NOT DETECTED Final   Cryptosporidium NOT DETECTED NOT DETECTED Final   Cyclospora cayetanensis NOT DETECTED NOT DETECTED Final   Entamoeba histolytica NOT DETECTED NOT DETECTED Final   Giardia lamblia NOT DETECTED NOT DETECTED Final   Adenovirus F40/41 NOT DETECTED NOT DETECTED Final   Astrovirus NOT DETECTED NOT DETECTED Final   Norovirus GI/GII NOT DETECTED NOT DETECTED Final   Rotavirus A NOT DETECTED NOT DETECTED Final   Sapovirus (I, II, IV, and V) NOT DETECTED NOT DETECTED Final    Comment: Performed at [facility], [address] [place], [place] [address]     Serology:    Imaging: If present, new imagings (plain films, ct scans, and mri) have been personally visualized and interpreted; radiology reports have been reviewed. Decision making incorporated into the Impression / Recommendations.   12/28 ct abd pelv with contrast 1. Long segment acute colitis, most affecting the transverse colon and splenic flexure. No bowel obstruction, perforation, or abscess. Underlying highly redundant large bowel with extensive retained stool. 2. Little to no renal contrast excretion, raising the possibility of acute renal insufficiency. 3. No acute traumatic injury identified.   12/28 ct c spine 1. Mildly motion degraded, no acute traumatic injury identified in the cervical spine. 2. Chronic C3-C6 ACDF, with advanced adjacent segment degeneration.   12/28 ct head 1. Left periorbital soft tissue injury. 2. No acute intracranial abnormality. Stable  non-contrast CT appearance of chronic small vessel disease.  12/28 xray right tib-fib FINDINGS: There is no evidence of fracture or other focal bone lesions. Prior knee arthroplasty noted. Peripheral vascular calcification noted.   IMPRESSION: No acute findings.   12/28 xray left hand FINDINGS: There is no evidence of fracture or dislocation. Osteoarthritis is seen involving the 2nd and 3rd DIP joints and interphalangeal joint of the thumb. Moderate to severe osteoarthritis is also seen involving the base of the thumb, STT joint complex, and radiocarpal joint.   IMPRESSION: No acute findings.   Osteoarthritis, as described above.   12/31 ct foot right 1. Postsurgical changes at the base of the first metatarsal with 2 fractured screws, one of which demonstrates up to 5 mm of displacement. 2. No appreciable acute fracture involving the visualized bones. No dislocation. 3. Hallux valgus with mild-to-moderate degenerative changes of the first MTP joint. 4. Moderate degenerative changes at the first metatarsal head-hallux sesamoid articulation. 5. Mild-to-moderate osteoarthritis throughout the hindfoot and midfoot. Well corticated ossicle at the dorsal aspect of the Lisfranc joint. 6. Thickening of the distal Achilles tendon may reflect tendinosis. Foci of calcification within the distal Achilles tendon may relate to prior trauma.  Constance ONEIDA Passer, MD [facility] [phone] pager    10/21/2024, 1:06 PM      [1]  Social History Tobacco Use   Smoking status: Never   Smokeless tobacco: Never  Vaping Use   Vaping status: Never Used  Substance Use Topics   Alcohol use: Yes    Comment: Very rare- maybe once a year   Drug use: Not Currently  [2]  Allergies  Allergen Reactions   Tetanus Toxoid-Containing Vaccines Anaphylaxis   Tetanus-Diphtheria Toxoids Td Anaphylaxis   Meloxicam Nausea And Vomiting   Phentermine  Nausea And Vomiting   Sulfa Antibiotics Diarrhea and Nausea And Vomiting   Zithromax [Azithromycin] Diarrhea and Nausea And Vomiting   "

## 2024-10-21 NOTE — Progress Notes (Signed)
" °   10/21/24 2231  BiPAP/CPAP/SIPAP  BiPAP/CPAP/SIPAP Pt Type Adult  BiPAP/CPAP/SIPAP DREAMSTATIOND  Mask Type Nasal mask  Dentures removed? Not applicable  Mask Size Medium  Respiratory Rate 18 breaths/min  FiO2 (%)  (2l bled in)  Patient Home Machine No  Patient Home Mask No  Patient Home Tubing No  Auto Titrate Yes  Minimum cmH2O 5 cmH2O  Maximum cmH2O 20 cmH2O  Nasal massage performed Yes  CPAP/SIPAP surface wiped down Yes  Device Plugged into RED Power Outlet Yes  BiPAP/CPAP /SiPAP Vitals  Pulse Rate 78  SpO2 98 %  Bilateral Breath Sounds Expiratory wheezes    "

## 2024-10-22 ENCOUNTER — Telehealth (HOSPITAL_COMMUNITY): Payer: Self-pay | Admitting: Pharmacy Technician

## 2024-10-22 ENCOUNTER — Other Ambulatory Visit (HOSPITAL_COMMUNITY): Payer: Self-pay

## 2024-10-22 DIAGNOSIS — I2510 Atherosclerotic heart disease of native coronary artery without angina pectoris: Secondary | ICD-10-CM | POA: Diagnosis not present

## 2024-10-22 DIAGNOSIS — A419 Sepsis, unspecified organism: Secondary | ICD-10-CM | POA: Diagnosis not present

## 2024-10-22 DIAGNOSIS — K529 Noninfective gastroenteritis and colitis, unspecified: Secondary | ICD-10-CM | POA: Diagnosis not present

## 2024-10-22 DIAGNOSIS — G4733 Obstructive sleep apnea (adult) (pediatric): Secondary | ICD-10-CM | POA: Diagnosis not present

## 2024-10-22 LAB — CBC
HCT: 37.2 % (ref 36.0–46.0)
Hemoglobin: 11.9 g/dL — ABNORMAL LOW (ref 12.0–15.0)
MCH: 29.7 pg (ref 26.0–34.0)
MCHC: 32 g/dL (ref 30.0–36.0)
MCV: 92.8 fL (ref 80.0–100.0)
Platelets: 239 K/uL (ref 150–400)
RBC: 4.01 MIL/uL (ref 3.87–5.11)
RDW: 12.9 % (ref 11.5–15.5)
WBC: 7.9 K/uL (ref 4.0–10.5)
nRBC: 0 % (ref 0.0–0.2)

## 2024-10-22 LAB — BASIC METABOLIC PANEL WITH GFR
Anion gap: 10 (ref 5–15)
BUN: <5 mg/dL — ABNORMAL LOW (ref 8–23)
CO2: 30 mmol/L (ref 22–32)
Calcium: 9.5 mg/dL (ref 8.9–10.3)
Chloride: 97 mmol/L — ABNORMAL LOW (ref 98–111)
Creatinine, Ser: 0.51 mg/dL (ref 0.44–1.00)
GFR, Estimated: >60 mL/min
Glucose, Bld: 145 mg/dL — ABNORMAL HIGH (ref 70–99)
Potassium: 3.8 mmol/L (ref 3.5–5.1)
Sodium: 137 mmol/L (ref 135–145)

## 2024-10-22 LAB — MAGNESIUM: Magnesium: 1.9 mg/dL (ref 1.7–2.4)

## 2024-10-22 LAB — CULTURE, BLOOD (ROUTINE X 2)
Culture: NO GROWTH
Culture: NO GROWTH
Special Requests: ADEQUATE

## 2024-10-22 MED ORDER — FUROSEMIDE 10 MG/ML IJ SOLN
40.0000 mg | Freq: Once | INTRAMUSCULAR | Status: AC
  Administered 2024-10-22: 40 mg via INTRAVENOUS
  Filled 2024-10-22: qty 4

## 2024-10-22 NOTE — Telephone Encounter (Signed)
 Patient Product/process Development Scientist completed.    The patient is insured through [employer]. Patient has Medicare and is not eligible for a copay card, but may be able to apply for patient assistance or Medicare RX Payment Plan (Patient Must reach out to their plan, if eligible for payment plan), if available.    Ran test claim for vancomycin  125 mg and the current 10 day co-pay is $57.98.   This test claim was processed through [facility]- copay amounts may vary at other pharmacies due to pharmacy/plan contracts, or as the patient moves through the different stages of their insurance plan.     Reyes Sharps, CPHT Pharmacy Technician Patient Advocate Specialist Lead [facility] Patient Advocate Team Direct Number: [phone]  Fax: [phone]

## 2024-10-22 NOTE — Progress Notes (Signed)
 " PROGRESS NOTE    Courtney [name]  FMW:[id number] DOB: 03-08-1946 DOA: 10/17/2024 PCP: Ransom Other, MD   Chief Complaint  Patient presents with   Fall   Near Syncope    Brief Narrative:  Courtney [name] is a 79 y.o. female with medical history significant of anemia, hyperlipidemia, aortic atherosclerosis, aortic calcification, osteoarthritis of the wrist, asthma, bipolar disorder, depression, hiatal hernia, nephrolithiasis, hypertension, hypothyroidism, mitral valve prolapse, OSA on CPAP, pulmonary embolism, history of CVA with residual right-sided hemiparesis, thrombophlebitis, vertigo, multiple episodes of C. difficile colitis, diverticulosis, diverticulitis who started having diarrhea multiple episodes yesterday and is coming to the emergency department after having a fall at her ALF. ... See H&P for full HPI on admission & ED course.   ED course was most notable for significant leukocytosis WBC 24, negative viral PCRs, lactic acid 3.1.  CT of abdomen and pelvis showed acute colitis affecting the transverse colon and splenic flexure.  UA was also concerning for infection, urine culture has been added on.   Patient was admitted to the hospital on afternoon of 10/17/2024 for further evaluation and management of sepsis with acute on chronic colitis, AKI, hypokalemia as outlined in detail below.   Assessment & Plan:   Principal Problem:   Sepsis due to undetermined organism [facility]) Active Problems:   Hypertension   Dyslipidemia   OSA (obstructive sleep apnea)   CAD (coronary artery disease)   AKI (acute kidney injury)   Hypokalemia   Acute on chronic colitis   Intestinal infection due to enterotoxigenic E. coli  #1 sepsis due to undetermined organism in the setting of acute on chronic colitis/UTI/enterotoxigenic E. coli -Initial antibiotics started on admission for IV Rocephin , IV Flagyl  empiric oral vancomycin  apparently as recommended by her primary GI for prevention of  recurrent C. difficile. - Urine cultures did grow 30,000 colonies of Pseudomonas. - IV Rocephin  changed to cefepime . - Continue Flagyl . - Diet advanced to soft diet which patient is tolerating. - IV antiemetics as needed - Analgesics as needed. - Stool studies positive for enterotoxigenic E. coli. - IV cefepime  changed to ciprofloxacin  and Cipro  subsequently discontinued as CT of the right foot with thickening of the distal Achilles tendon reflecting tendinosis with calcification within the distal Achilles tendon may be related to prior trauma. -Patient seen in consultation by ID who are recommended discontinuation of systemic antibiotics, continuation of twice daily p.o. Vanco 125 mg x 7 more days until 10/28/2024, continuation of standard enteric isolation precautions. -ID recommended stopping Nexium if patient does not have PUD or Barrett's esophagus as his high risk for C. difficile. -PPI discontinued and patient placed on Pepcid. -Supportive care. - Follow.  2.  AKI-resolved -Likely secondary to a prerenal azotemia. - Resolved with hydration.   3.  Right foot pain -Patient noted to have suffered a fall prior to admission and presented with persistent right foot pain not previously imaged. - Patient with requisite tenderness over the dorsal midfoot. - X-rays ordered showing prior surgical changes with 1 screw confirmed as broken and a second screw suspected to be broken. - CT of the right foot ordered per orthopedics showed postsurgical changes at the base of the first metatarsal with 2 fractured screws, 1 of which demonstrates up to 5 mm of displacement.  No appreciable acute fracture involving the visualized bones.  No dislocation.  Hallux valgus with mild to moderate degenerative changes of the first MTP joint.  Moderate degenerative changes at the first metatarsal head and hallux  sesamoid articulation.  Mild to moderate OA throughout the hindfoot and midfoot.  Well-corticated ossicle at  the dorsal aspect of the Lisfranc joint.  Thickening of the distal Achilles tendon may reflect tendinosis..  Calcification within the distal Achilles tendon may relate to prior trauma. - Per orthopedic note x-ray and CT scans reviewed and no acute fracture noted, 2 broken screws seen in the first metatarsal, unclear as to timing of hardware failure.   - Orthopedics recommending WBAT RLE in postop shoe with walker.  PT/OT. - Outpatient follow-up with orthopedics.   4.  Hypokalemia - Likely secondary to GI losses.   - Potassium at 3.8.   - Magnesium  at 1.9.  - Repeat labs in the AM.    5.  QTc prolongation -Repeat EKG with resolution of QTc prolongation.  6.  Hypertension - Verapamil .   7.  Hyperlipidemia - Continue to hold statin and resume on discharge.  8. OSA - Continue CPAP nightly.  9.  CAD -Continue aspirin . - Continue to hold statin and resume on discharge.  10.  History of asthma per patient -Patient with some expiratory wheezing noted on exam. - Continue Breztri . - Continue scheduled DuoNebs twice daily. - Status post IV Solu-Medrol  40 mg x 1. - Lasix  40 mg IV x 1.    DVT prophylaxis: Lovenox  Code Status: Full Family Communication: Updated patient and husband at bedside. Disposition: Medically stable.  Awaiting SNF placement.    Status is: Inpatient Remains inpatient appropriate because: Severity of illness   Consultants:  Orthopedics ID: Dr.Vu 10/21/2024  Procedures:  CT head 10/17/2024 CT C-spine 10/17/2024 CT abdomen and pelvis 10/17/2024 CT right foot 10/20/2024 Plan films of the right foot 10/19/2024 Plain films of the left hand 10/17/2024 Plain films of the right tib-fib 10/17/2024 Chest x-ray 10/17/2024  Antimicrobials:  Anti-infectives (From admission, onward)    Start     Dose/Rate Route Frequency Ordered Stop   10/21/24 1415  vancomycin  (VANCOCIN ) capsule 125 mg        125 mg Oral 2 times daily 10/21/24 1318 10/28/24 0959   10/20/24 2000   ciprofloxacin  (CIPRO ) tablet 500 mg  Status:  Discontinued        500 mg Oral 2 times daily 10/20/24 1635 10/20/24 2050   10/19/24 1130  ceFEPIme  (MAXIPIME ) 2 g in sodium chloride  0.9 % 100 mL IVPB  Status:  Discontinued        2 g 200 mL/hr over 30 Minutes Intravenous Every 8 hours 10/19/24 1031 10/20/24 1634   10/18/24 1000  cefTRIAXone  (ROCEPHIN ) 2 g in sodium chloride  0.9 % 100 mL IVPB  Status:  Discontinued        2 g 200 mL/hr over 30 Minutes Intravenous Every 24 hours 10/17/24 1330 10/19/24 0957   10/17/24 2200  metroNIDAZOLE  (FLAGYL ) IVPB 500 mg  Status:  Discontinued        500 mg 100 mL/hr over 60 Minutes Intravenous Every 12 hours 10/17/24 1330 10/21/24 0932   10/17/24 1800  vancomycin  (VANCOCIN ) capsule 125 mg  Status:  Discontinued        125 mg Oral 4 times daily 10/17/24 1542 10/21/24 0932   10/17/24 1015  cefTRIAXone  (ROCEPHIN ) 2 g in sodium chloride  0.9 % 100 mL IVPB       Placed in And Linked Group   2 g 200 mL/hr over 30 Minutes Intravenous  Once 10/17/24 1009 10/17/24 1153   10/17/24 1015  metroNIDAZOLE  (FLAGYL ) IVPB 500 mg  Placed in And Linked Group   500 mg 100 mL/hr over 60 Minutes Intravenous  Once 10/17/24 1009 10/17/24 1117   10/17/24 1015  vancomycin  (VANCOCIN ) capsule 125 mg        125 mg Oral  Once 10/17/24 1009 10/17/24 1455         Subjective: Patient sitting up in bed getting ready to eat her lunch.  Denies any chest pain.  No significant shortness of breath.  Feels wheezing has improved some.  Improved stools.  Husband at bedside.   Objective: Vitals:   10/21/24 2139 10/21/24 2211 10/21/24 2231 10/22/24 0441  BP: (!) 155/85   (!) 158/95  Pulse:   78 91  Resp:    16  Temp:    98.3 F (36.8 C)  TempSrc:    Oral  SpO2:  98% 98% 95%  Weight:      Height:        Intake/Output Summary (Last 24 hours) at 10/22/2024 1315 Last data filed at 10/22/2024 0834 Gross per 24 hour  Intake 480 ml  Output 2650 ml  Net -2170 ml   Filed Weights    10/17/24 0736  Weight: 88.5 kg    Examination:  General exam: NAD. Respiratory system: Minimal expiratory wheezing.  No use of accessory muscles of respiration.  Speaking in full sentences.  No significant rhonchi noted.  Cardiovascular system: RRR no murmurs rubs or gallops.  No JVD.  No lower extremity edema.  Gastrointestinal system: Abdomen is soft, nontender, nondistended, positive bowel sounds.  No rebound.  No guarding.  Central nervous system: Alert and oriented. No focal neurological deficits. Extremities: Right foot less tender to palpation.  Symmetric 5 x 5 power. Skin: No rashes, lesions or ulcers Psychiatry: Judgement and insight appear normal. Mood & affect appropriate.     Data Reviewed: I have personally reviewed following labs and imaging studies  CBC: Recent Labs  Lab 10/17/24 1451 10/19/24 0705 10/20/24 0431 10/21/24 0436 10/22/24 0351  WBC 26.3* 13.0* 10.4 9.6 7.9  NEUTROABS 22.6*  --   --   --   --   HGB 11.8* 11.2* 10.2* 10.0* 11.9*  HCT 38.6 35.7* 32.6* 31.3* 37.2  MCV 97.0 96.2 95.0 93.2 92.8  PLT 222 157 163 147* 239    Basic Metabolic Panel: Recent Labs  Lab 10/17/24 1451 10/18/24 1651 10/19/24 0705 10/20/24 0431 10/21/24 0436 10/22/24 0351  NA 136  --  137 138 137 137  K 4.9  --  3.8 3.4* 3.6 3.8  CL 99  --  101 103 100 97*  CO2 28  --  28 29 29 30   GLUCOSE 113*  --  82 93 87 145*  BUN 23  --  11 7* 6* <5*  CREATININE 1.20*  --  0.67 0.60 0.58 0.51  CALCIUM  9.5  --  9.2 8.9 8.9 9.5  MG  --  1.7  --  1.8 2.0 1.9    GFR: Estimated Creatinine Clearance: 61.9 mL/min (by C-G formula based on SCr of 0.51 mg/dL).  Liver Function Tests: Recent Labs  Lab 10/17/24 0748 10/17/24 1451  AST 25 35  ALT 16 18  ALKPHOS 144* 144*  BILITOT 0.5 0.4  PROT 6.8 5.9*  ALBUMIN 4.1 3.6    CBG: Recent Labs  Lab 10/17/24 0753  GLUCAP 126*     Recent Results (from the past 240 hours)  Resp panel by RT-PCR (RSV, Flu A&B, Covid) Anterior  Nasal Swab     Status:  None   Collection Time: 10/17/24  8:04 AM   Specimen: Anterior Nasal Swab  Result Value Ref Range Status   SARS Coronavirus 2 by RT PCR NEGATIVE NEGATIVE Final    Comment: (NOTE) SARS-CoV-2 target nucleic acids are NOT DETECTED.  The SARS-CoV-2 RNA is generally detectable in upper respiratory specimens during the acute phase of infection. The lowest concentration of SARS-CoV-2 viral copies this assay can detect is 138 copies/mL. A negative result does not preclude SARS-Cov-2 infection and should not be used as the sole basis for treatment or other patient management decisions. A negative result may occur with  improper specimen collection/handling, submission of specimen other than nasopharyngeal swab, presence of viral mutation(s) within the areas targeted by this assay, and inadequate number of viral copies(<138 copies/mL). A negative result must be combined with clinical observations, patient history, and epidemiological information. The expected result is Negative.  Fact Sheet for Patients:  bloggercourse.com  Fact Sheet for Healthcare Providers:  seriousbroker.it  This test is no t yet approved or cleared by the [place]  FDA and  has been authorized for detection and/or diagnosis of SARS-CoV-2 by FDA under an Emergency Use Authorization (EUA). This EUA will remain  in effect (meaning this test can be used) for the duration of the COVID-19 declaration under Section 564(b)(1) of the Act, 21 U.S.C.section 360bbb-3(b)(1), unless the authorization is terminated  or revoked sooner.       Influenza A by PCR NEGATIVE NEGATIVE Final   Influenza B by PCR NEGATIVE NEGATIVE Final    Comment: (NOTE) The Xpert Xpress SARS-CoV-2/FLU/RSV plus assay is intended as an aid in the diagnosis of influenza from Nasopharyngeal swab specimens and should not be used as a sole basis for treatment. Nasal washings  and aspirates are unacceptable for Xpert Xpress SARS-CoV-2/FLU/RSV testing.  Fact Sheet for Patients: bloggercourse.com  Fact Sheet for Healthcare Providers: seriousbroker.it  This test is not yet approved or cleared by the [place]  FDA and has been authorized for detection and/or diagnosis of SARS-CoV-2 by FDA under an Emergency Use Authorization (EUA). This EUA will remain in effect (meaning this test can be used) for the duration of the COVID-19 declaration under Section 564(b)(1) of the Act, 21 U.S.C. section 360bbb-3(b)(1), unless the authorization is terminated or revoked.     Resp Syncytial Virus by PCR NEGATIVE NEGATIVE Final    Comment: (NOTE) Fact Sheet for Patients: bloggercourse.com  Fact Sheet for Healthcare Providers: seriousbroker.it  This test is not yet approved or cleared by the [place]  FDA and has been authorized for detection and/or diagnosis of SARS-CoV-2 by FDA under an Emergency Use Authorization (EUA). This EUA will remain in effect (meaning this test can be used) for the duration of the COVID-19 declaration under Section 564(b)(1) of the Act, 21 U.S.C. section 360bbb-3(b)(1), unless the authorization is terminated or revoked.  Performed at [facility], [address], [place], [place] [address]   Blood Culture (routine x 2)     Status: None   Collection Time: 10/17/24  8:48 AM   Specimen: BLOOD  Result Value Ref Range Status   Specimen Description   Final    BLOOD BLOOD RIGHT FOREARM Performed at [facility], [address], [place], [place] [address]    Special Requests   Final    BOTTLES DRAWN AEROBIC AND ANAEROBIC Blood Culture results may not be optimal due to an inadequate volume of blood received in culture bottles Performed at [facility],  [address],  [place], [place] [address]    Culture   Final    NO GROWTH 5 DAYS Performed at [facility], [address], [place], [place] [address]    Report Status 10/22/2024 FINAL  Final  Blood Culture (routine x 2)     Status: None   Collection Time: 10/17/24  8:56 AM   Specimen: BLOOD  Result Value Ref Range Status   Specimen Description   Final    BLOOD RIGHT ANTECUBITAL Performed at [facility], [address] [place], [place] [address]    Special Requests   Final    BOTTLES DRAWN AEROBIC AND ANAEROBIC Blood Culture adequate volume Performed at [facility], [address] [place], [place] [address]    Culture   Final    NO GROWTH 5 DAYS Performed at [facility], [address], [place], [place] [address]    Report Status 10/22/2024 FINAL  Final  Urine Culture (for pregnant, neutropenic or urologic patients or patients with an indwelling urinary catheter)     Status: Abnormal   Collection Time: 10/17/24  9:25 AM   Specimen: Urine, Clean Catch  Result Value Ref Range Status   Specimen Description   Final    URINE, CLEAN CATCH Performed at [facility], [address] [place], [place] [address]    Special Requests   Final    NONE Performed at [facility], [address] [place], [place] [address]    Culture (A)  Final    30,000 COLONIES/mL PSEUDOMONAS AERUGINOSA 30,000 COLONIES/mL ENTEROCOCCUS FAECIUM    Report Status 10/21/2024 FINAL  Final   Organism ID, Bacteria PSEUDOMONAS AERUGINOSA (A)  Final   Organism ID, Bacteria ENTEROCOCCUS FAECIUM (A)  Final      Susceptibility   Enterococcus faecium - MIC*    AMPICILLIN  <=2 SENSITIVE Sensitive     NITROFURANTOIN 64 INTERMEDIATE Intermediate     VANCOMYCIN  <=0.5 SENSITIVE Sensitive     * 30,000 COLONIES/mL ENTEROCOCCUS FAECIUM   Pseudomonas aeruginosa - MIC*    MEROPENEM <=0.25 SENSITIVE Sensitive     CIPROFLOXACIN  0.5 SENSITIVE Sensitive      IMIPENEM 1 SENSITIVE Sensitive     PIP/TAZO Value in next row Sensitive      8 SENSITIVEThis is a modified FDA-approved test that has been validated and its performance characteristics determined by the reporting laboratory.  This laboratory is certified under the Clinical Laboratory Improvement Amendments CLIA as qualified to perform high complexity clinical laboratory testing.    CEFTAZIDIME/AVIBACTAM Value in next row Sensitive      8 SENSITIVEThis is a modified FDA-approved test that has been validated and its performance characteristics determined by the reporting laboratory.  This laboratory is certified under the Clinical Laboratory Improvement Amendments CLIA as qualified to perform high complexity clinical laboratory testing.    CEFTOLOZANE/TAZOBACTAM Value in next row Sensitive      8 SENSITIVEThis is a modified FDA-approved test that has been validated and its performance characteristics determined by the reporting laboratory.  This laboratory is certified under the Clinical Laboratory Improvement Amendments CLIA as qualified to perform high complexity clinical laboratory testing.    TOBRAMYCIN Value in next row Sensitive      8 SENSITIVEThis is a modified FDA-approved test that has been validated and its performance characteristics determined by the reporting laboratory.  This laboratory is certified under the Clinical Laboratory Improvement Amendments CLIA as qualified to perform high  complexity clinical laboratory testing.    CEFTAZIDIME Value in next row Sensitive      8 SENSITIVEThis is a modified FDA-approved test that has been validated and its performance characteristics determined by the reporting laboratory.  This laboratory is certified under the Clinical Laboratory Improvement Amendments CLIA as qualified to perform high complexity clinical laboratory testing.    * 30,000 COLONIES/mL PSEUDOMONAS AERUGINOSA  C Difficile Quick Screen w PCR reflex     Status: None   Collection  Time: 10/18/24 12:47 PM   Specimen: STOOL  Result Value Ref Range Status   C Diff antigen NEGATIVE NEGATIVE Final   C Diff toxin NEGATIVE NEGATIVE Final   C Diff interpretation No C. difficile detected.  Final    Comment: Performed at [facility], [address], [place], [place] [address]  Gastrointestinal Panel by PCR , Stool     Status: Abnormal   Collection Time: 10/18/24 12:47 PM   Specimen: STOOL  Result Value Ref Range Status   Campylobacter species NOT DETECTED NOT DETECTED Final   Plesimonas shigelloides NOT DETECTED NOT DETECTED Final   Salmonella species NOT DETECTED NOT DETECTED Final   Yersinia enterocolitica NOT DETECTED NOT DETECTED Final   Vibrio species NOT DETECTED NOT DETECTED Final   Vibrio cholerae NOT DETECTED NOT DETECTED Final   Enteroaggregative E coli (EAEC) NOT DETECTED NOT DETECTED Final   Enteropathogenic E coli (EPEC) NOT DETECTED NOT DETECTED Final   Enterotoxigenic E coli (ETEC) DETECTED (A) NOT DETECTED Final    Comment: RESULT CALLED TO, READ BACK BY AND VERIFIED WITH: HILLARY MASHBURN 10/20/24 1219 MU    Shiga like toxin producing E coli (STEC) NOT DETECTED NOT DETECTED Final   Shigella/Enteroinvasive E coli (EIEC) NOT DETECTED NOT DETECTED Final   Cryptosporidium NOT DETECTED NOT DETECTED Final   Cyclospora cayetanensis NOT DETECTED NOT DETECTED Final   Entamoeba histolytica NOT DETECTED NOT DETECTED Final   Giardia lamblia NOT DETECTED NOT DETECTED Final   Adenovirus F40/41 NOT DETECTED NOT DETECTED Final   Astrovirus NOT DETECTED NOT DETECTED Final   Norovirus GI/GII NOT DETECTED NOT DETECTED Final   Rotavirus A NOT DETECTED NOT DETECTED Final   Sapovirus (I, II, IV, and V) NOT DETECTED NOT DETECTED Final    Comment: Performed at [facility], [address] [place], [place] [address]         Radiology Studies: No results found.       Scheduled Meds:  aspirin  EC  325 mg Oral Daily    budesonide -glycopyrrolate -formoterol   2 puff Inhalation BID   buPROPion   150 mg Oral Daily   enoxaparin  (LOVENOX ) injection  40 mg Subcutaneous Q24H   escitalopram   20 mg Oral Daily   famotidine  40 mg Oral Daily   fluticasone   1 spray Each Nare Daily   gabapentin   100 mg Oral BID   gabapentin   300 mg Oral QHS   ipratropium-albuterol   3 mL Nebulization TID   levothyroxine   50 mcg Oral QAC breakfast   LORazepam   1 mg Oral QHS   melatonin  5 mg Oral QHS   montelukast   10 mg Oral QHS   vancomycin   125 mg Oral BID   verapamil   360 mg Oral Daily   Continuous Infusions:     LOS: 5 days    Time spent: 40 minutes    Toribio Hummer, MD [facility]   To contact the attending provider between 7A-7P or the covering provider during after hours 7P-7A,  please log into the web site [email or web address] and access using universal [place] password for that web site. If you do not have the password, please call the hospital operator.  10/22/2024, 1:15 PM    "

## 2024-10-22 NOTE — Plan of Care (Signed)

## 2024-10-22 NOTE — Evaluation (Signed)
 Occupational Therapy Evaluation Patient Details Name: Courtney [name] MRN: [id number] DOB: 30-May-1946 Today's Date: 10/22/2024   History of Present Illness   79 yo F adm 10/17/24 with fall at home and near syncope with N/V/D and concern for sepsis with hypokalemia and AKI with UTI. Pt with R foot pain, no acute fx, orthopedics recommending WBAT RLE in postop shoe with walker. PMH: recent bladder stimulator implant on 6/20, CVA, Asthma, HTN, HLD, right side weakness, depression/anxiety, OSA, hiatal hernia, MVP, OSA on CPAP, CVA with R hemiparesis, asthma and anemia.     Clinical Impressions Pt admitted with above. Prior to admission, pt was mod independent using 4WW at her ILF apartment. Lives with spouse who assists her with getting out of shower. Pt performs grooming tasks with setup in long sitting, rolls L<>R for pericare and linen change. Anticipate +2 for safe transfers to recliner and MAX A for LB ADLs. Pt presents with generalized weakness, decreased activity tolerance, and impaired balance. Pt would benefit from skilled OT services to address noted impairments and functional limitations (see below for any additional details) in order to maximize safety and independence while minimizing falls risk and caregiver burden. Anticipate the need for follow up OT services upon acute hospital DC. Patient will benefit from continued inpatient follow up therapy, <3 hours/day      If plan is discharge home, recommend the following:   Two people to help with walking and/or transfers;A lot of help with bathing/dressing/bathroom;Help with stairs or ramp for entrance     Functional Status Assessment   Patient has had a recent decline in their functional status and demonstrates the ability to make significant improvements in function in a reasonable and predictable amount of time.     Equipment Recommendations   None recommended by OT      Precautions/Restrictions    Precautions Precautions: Fall Recall of Precautions/Restrictions: Intact Required Braces or Orthoses: Other Brace Other Brace: post op shoe on R Restrictions Weight Bearing Restrictions Per Provider Order: Yes RLE Weight Bearing Per Provider Order: Weight bearing as tolerated     Mobility Bed Mobility Overal bed mobility: Needs Assistance Bed Mobility: Rolling Rolling: Min assist, Used rails         General bed mobility comments: Pt with good efforts rolling for pericare and linen change, able to boost self up in bed with use of bed features    Transfers Overall transfer level: Needs assistance                 General transfer comment: NT, recommend +2 as pt with R knee buckling on PT eval      Balance Overall balance assessment: Needs assistance Sitting-balance support: Feet supported   Sitting balance - Comments: NT                                   ADL either performed or assessed with clinical judgement   ADL Overall ADL's : Needs assistance/impaired Eating/Feeding: Independent;Sitting   Grooming: Set up;Sitting;Bed level Grooming Details (indicate cue type and reason): upright in bed Upper Body Bathing: Sitting;Minimal assistance   Lower Body Bathing: Sit to/from stand;Maximal assistance   Upper Body Dressing : Sitting;Minimal assistance   Lower Body Dressing: Sit to/from stand;Maximal assistance     Toilet Transfer Details (indicate cue type and reason): anticipate +2 for safety (pt declined to perform OOB assessment this date, recently recieved Lasix  and with increased  urinary frequency) Toileting- Clothing Manipulation and Hygiene: Bed level;Maximal assistance Toileting - Clothing Manipulation Details (indicate cue type and reason): purewick in place       General ADL Comments: Pt politely declines OOB assessment, recently recieved Lasix  and has been having urinary frequency. Able to roll L<>R for pericare and linen change,  performs grooming tasks in long sitting with setup.     Vision Baseline Vision/History: 1 Wears glasses Ability to See in Adequate Light: 0 Adequate Patient Visual Report: No change from baseline              Pertinent Vitals/Pain Pain Assessment Pain Assessment: No/denies pain Pain Score: 0-No pain     Extremity/Trunk Assessment Upper Extremity Assessment Upper Extremity Assessment: Right hand dominant;Generalized weakness LUE Deficits / Details: ecchymosis and edema on dorsum of hand with pt report fell on it able to grip and use it though painful. imaging neg.   Lower Extremity Assessment Lower Extremity Assessment: Defer to PT evaluation   Cervical / Trunk Assessment Cervical / Trunk Assessment: Kyphotic   Communication Communication Communication: No apparent difficulties   Cognition Arousal: Alert Behavior During Therapy: WFL for tasks assessed/performed Cognition: No apparent impairments                               Following commands: Intact       Cueing  General Comments   Cueing Techniques: Verbal cues  VSS on RA   Exercises     Shoulder Instructions      Home Living Family/patient expects to be discharged to:: Private residence Living Arrangements: Spouse/significant other Available Help at Discharge: Family;Available 24 hours/day Type of Home: Independent living facility Home Access: Level entry     Home Layout: One level     Bathroom Shower/Tub: Producer, Television/film/video: Handicapped height     Home Equipment: Rollator (4 wheels);Grab bars - toilet;Grab bars - tub/shower;Shower seat;Cane - single point;Cane - quad;Transport chair;Wheelchair - manual   Additional Comments: resides at [employer] ILF      Prior Functioning/Environment Prior Level of Function : Needs assist             Mobility Comments: spouse reports had fall in ED when legs buckled, states this happens at times at home as well with falls  in July and September 2025 ADLs Comments: uses AE for LBdressing/bathing tasks. typically mod ind, spouse helps her get out of shower    OT Problem List: Decreased strength;Decreased range of motion;Decreased activity tolerance;Impaired balance (sitting and/or standing);Decreased safety awareness;Decreased knowledge of use of DME or AE;Decreased knowledge of precautions;Obesity;Pain   OT Treatment/Interventions: Self-care/ADL training;Energy conservation;DME and/or AE instruction;Therapeutic exercise;Therapeutic activities;Patient/family education;Balance training      OT Goals(Current goals can be found in the care plan section)   Acute Rehab OT Goals OT Goal Formulation: With patient Time For Goal Achievement: 11/05/24 Potential to Achieve Goals: Good   OT Frequency:  Min 2X/week       AM-PAC OT 6 Clicks Daily Activity     Outcome Measure Help from another person eating meals?: A Little Help from another person taking care of personal grooming?: A Little Help from another person toileting, which includes using toliet, bedpan, or urinal?: A Lot Help from another person bathing (including washing, rinsing, drying)?: A Lot Help from another person to put on and taking off regular upper body clothing?: A Lot Help from another person to put on and  taking off regular lower body clothing?: A Lot 6 Click Score: 14   End of Session Nurse Communication: Mobility status  Activity Tolerance: Patient tolerated treatment well Patient left: in bed;with bed alarm set;with call bell/phone within reach  OT Visit Diagnosis: Muscle weakness (generalized) (M62.81);Unsteadiness on feet (R26.81);Other abnormalities of gait and mobility (R26.89);History of falling (Z91.81)                Time: 8363-8287 OT Time Calculation (min): 36 min Charges:  OT General Charges $OT Visit: 1 Visit OT Evaluation $OT Eval Moderate Complexity: 1 Mod OT Treatments $Self Care/Home Management : 8-22  mins  Tekia Waterbury L. Levoy Geisen, OTR/L  10/22/2024, 6:11 PM

## 2024-10-23 DIAGNOSIS — I10 Essential (primary) hypertension: Secondary | ICD-10-CM | POA: Diagnosis not present

## 2024-10-23 DIAGNOSIS — E876 Hypokalemia: Secondary | ICD-10-CM | POA: Diagnosis not present

## 2024-10-23 DIAGNOSIS — E785 Hyperlipidemia, unspecified: Secondary | ICD-10-CM | POA: Diagnosis not present

## 2024-10-23 DIAGNOSIS — I2510 Atherosclerotic heart disease of native coronary artery without angina pectoris: Secondary | ICD-10-CM | POA: Diagnosis not present

## 2024-10-23 DIAGNOSIS — N179 Acute kidney failure, unspecified: Secondary | ICD-10-CM | POA: Diagnosis not present

## 2024-10-23 DIAGNOSIS — G4733 Obstructive sleep apnea (adult) (pediatric): Secondary | ICD-10-CM | POA: Diagnosis not present

## 2024-10-23 DIAGNOSIS — R0902 Hypoxemia: Secondary | ICD-10-CM | POA: Diagnosis not present

## 2024-10-23 DIAGNOSIS — I2583 Coronary atherosclerosis due to lipid rich plaque: Secondary | ICD-10-CM | POA: Diagnosis not present

## 2024-10-23 DIAGNOSIS — K529 Noninfective gastroenteritis and colitis, unspecified: Secondary | ICD-10-CM | POA: Diagnosis not present

## 2024-10-23 DIAGNOSIS — A041 Enterotoxigenic Escherichia coli infection: Secondary | ICD-10-CM | POA: Diagnosis not present

## 2024-10-23 DIAGNOSIS — A419 Sepsis, unspecified organism: Secondary | ICD-10-CM | POA: Diagnosis not present

## 2024-10-23 LAB — BASIC METABOLIC PANEL WITH GFR
Anion gap: 7 (ref 5–15)
BUN: 14 mg/dL (ref 8–23)
CO2: 35 mmol/L — ABNORMAL HIGH (ref 22–32)
Calcium: 9.3 mg/dL (ref 8.9–10.3)
Chloride: 99 mmol/L (ref 98–111)
Creatinine, Ser: 0.64 mg/dL (ref 0.44–1.00)
GFR, Estimated: >60 mL/min
Glucose, Bld: 91 mg/dL (ref 70–99)
Potassium: 3.5 mmol/L (ref 3.5–5.1)
Sodium: 140 mmol/L (ref 135–145)

## 2024-10-23 LAB — MAGNESIUM: Magnesium: 1.9 mg/dL (ref 1.7–2.4)

## 2024-10-23 MED ORDER — POLYVINYL ALCOHOL 1.4 % OP SOLN: 1.0000 [drp] | OPHTHALMIC | Status: DC | PRN

## 2024-10-23 MED ORDER — NITROFURANTOIN MONOHYD MACRO 100 MG PO CAPS
100.0000 mg | ORAL_CAPSULE | Freq: Every day | ORAL | Status: DC
  Administered 2024-10-23 – 2024-10-25 (×3): 100 mg via ORAL
  Filled 2024-10-23 (×4): qty 1

## 2024-10-23 MED ORDER — MIRABEGRON ER 25 MG PO TB24
25.0000 mg | ORAL_TABLET | Freq: Every day | ORAL | Status: DC
  Administered 2024-10-23 – 2024-10-26 (×4): 25 mg via ORAL
  Filled 2024-10-23 (×4): qty 1

## 2024-10-23 MED ORDER — MECLIZINE HCL 25 MG PO TABS: 25.0000 mg | ORAL_TABLET | Freq: Three times a day (TID) | ORAL | Status: DC | PRN

## 2024-10-23 MED ORDER — NITROFURANTOIN MACROCRYSTAL 100 MG PO CAPS
100.0000 mg | ORAL_CAPSULE | Freq: Every day | ORAL | Status: DC
  Filled 2024-10-23: qty 1

## 2024-10-23 MED ORDER — PROSIGHT PO TABS
1.0000 | ORAL_TABLET | Freq: Every day | ORAL | Status: DC
  Administered 2024-10-23 – 2024-10-26 (×4): 1 via ORAL
  Filled 2024-10-23 (×4): qty 1

## 2024-10-23 MED ORDER — IPRATROPIUM-ALBUTEROL 0.5-2.5 (3) MG/3ML IN SOLN
3.0000 mL | Freq: Two times a day (BID) | RESPIRATORY_TRACT | Status: DC
  Administered 2024-10-23 – 2024-10-26 (×5): 3 mL via RESPIRATORY_TRACT
  Filled 2024-10-23 (×6): qty 3

## 2024-10-23 MED ORDER — POTASSIUM CHLORIDE CRYS ER 20 MEQ PO TBCR
40.0000 meq | EXTENDED_RELEASE_TABLET | Freq: Once | ORAL | Status: AC
  Administered 2024-10-23: 40 meq via ORAL
  Filled 2024-10-23: qty 2

## 2024-10-23 MED ORDER — VITAMIN D 25 MCG (1000 UNIT) PO TABS
1000.0000 [IU] | ORAL_TABLET | Freq: Every day | ORAL | Status: DC
  Administered 2024-10-23 – 2024-10-26 (×4): 1000 [IU] via ORAL
  Filled 2024-10-23 (×4): qty 1

## 2024-10-23 MED ORDER — CARBOXYMETHYLCELLULOSE SODIUM 0.5 % OP SOLN: 1.0000 [drp] | Freq: Every day | OPHTHALMIC | Status: DC | PRN

## 2024-10-23 MED ORDER — OCUVITE-LUTEIN PO CAPS: 1.0000 | ORAL_CAPSULE | Freq: Every day | ORAL | Status: DC

## 2024-10-23 MED ORDER — OYSTER SHELL CALCIUM/D3 500-5 MG-MCG PO TABS
1.0000 | ORAL_TABLET | Freq: Every day | ORAL | Status: DC
  Administered 2024-10-23 – 2024-10-26 (×4): 1 via ORAL
  Filled 2024-10-23 (×4): qty 1

## 2024-10-23 NOTE — Progress Notes (Signed)
 " PROGRESS NOTE    Courtney [name]  FMW:[id number] DOB: 01/28/1946 DOA: 10/17/2024 PCP: Ransom Other, MD   Chief Complaint  Patient presents with   Fall   Near Syncope    Brief Narrative:  Courtney [name] is a 79 y.o. female with medical history significant of anemia, hyperlipidemia, aortic atherosclerosis, aortic calcification, osteoarthritis of the wrist, asthma, bipolar disorder, depression, hiatal hernia, nephrolithiasis, hypertension, hypothyroidism, mitral valve prolapse, OSA on CPAP, pulmonary embolism, history of CVA with residual right-sided hemiparesis, thrombophlebitis, vertigo, multiple episodes of C. difficile colitis, diverticulosis, diverticulitis who started having diarrhea multiple episodes yesterday and is coming to the emergency department after having a fall at her ALF. ... See H&P for full HPI on admission & ED course.   ED course was most notable for significant leukocytosis WBC 24, negative viral PCRs, lactic acid 3.1.  CT of abdomen and pelvis showed acute colitis affecting the transverse colon and splenic flexure.  UA was also concerning for infection, urine culture has been added on.   Patient was admitted to the hospital on afternoon of 10/17/2024 for further evaluation and management of sepsis with acute on chronic colitis, AKI, hypokalemia as outlined in detail below.   Assessment & Plan:   Principal Problem:   Sepsis due to undetermined organism [facility]) Active Problems:   Hypertension   Dyslipidemia   OSA (obstructive sleep apnea)   CAD (coronary artery disease)   AKI (acute kidney injury)   Hypokalemia   Acute on chronic colitis   Intestinal infection due to enterotoxigenic E. coli  #1 sepsis due to undetermined organism in the setting of acute on chronic colitis/UTI/enterotoxigenic E. coli -Initial antibiotics started on admission for IV Rocephin , IV Flagyl  empiric oral vancomycin  apparently as recommended by her primary GI for prevention of  recurrent C. difficile. - Urine cultures did grow 30,000 colonies of Pseudomonas. - IV Rocephin  changed to cefepime  which was subsequently discontinued.. - Flagyl  discontinued. - Diet advanced to soft diet which patient is tolerating. - IV antiemetics as needed - Analgesics as needed. - Stool studies positive for enterotoxigenic E. coli. - IV cefepime  initially was changed to ciprofloxacin  and Cipro  subsequently discontinued as CT of the right foot with thickening of the distal Achilles tendon reflecting tendinosis with calcification within the distal Achilles tendon may be related to prior trauma. -Patient seen in consultation by ID who recommended discontinuation of systemic antibiotics, continuation of twice daily p.o. Vanco 125 mg x 7 more days until 10/28/2024, continuation of standard enteric isolation precautions. -ID recommended stopping Nexium if patient does not have PUD or Barrett's esophagus as his high risk for C. difficile. -PPI discontinued and patient placed on Pepcid . -Supportive care. - Follow.  2.  AKI-resolved -Likely secondary to a prerenal azotemia. - Resolved with hydration.   3.  Right foot pain -Patient noted to have suffered a fall prior to admission and presented with persistent right foot pain not previously imaged. - Patient with requisite tenderness over the dorsal midfoot. - X-rays ordered showing prior surgical changes with 1 screw confirmed as broken and a second screw suspected to be broken. - CT of the right foot ordered per orthopedics showed postsurgical changes at the base of the first metatarsal with 2 fractured screws, 1 of which demonstrates up to 5 mm of displacement.  No appreciable acute fracture involving the visualized bones.  No dislocation.  Hallux valgus with mild to moderate degenerative changes of the first MTP joint.  Moderate degenerative changes at the  first metatarsal head and hallux sesamoid articulation.  Mild to moderate OA throughout the  hindfoot and midfoot.  Well-corticated ossicle at the dorsal aspect of the Lisfranc joint.  Thickening of the distal Achilles tendon may reflect tendinosis..  Calcification within the distal Achilles tendon may relate to prior trauma. - Per orthopedic note x-ray and CT scans reviewed and no acute fracture noted, 2 broken screws seen in the first metatarsal, unclear as to timing of hardware failure.   - Orthopedics recommending WBAT RLE in postop shoe with walker.  PT/OT. - Outpatient follow-up with orthopedics.   4.  Hypokalemia - Likely secondary to GI losses.   - Potassium at 3.5.   - Magnesium  at 1.9.   - K-Dur 40 mEq p.o. x 1.  - Repeat labs in the AM.    5.  QTc prolongation -Repeat EKG with resolution of QTc prolongation.  6.  Hypertension - Continue verapamil .   7.  Hyperlipidemia - Continue to hold statin and resume on discharge.  8. OSA - CPAP nightly.   9.  CAD - Aspirin .   - Continue to hold statin and resume on discharge.   10.  History of asthma per patient -Patient with some expiratory wheezing noted on exam. - Continue Breztri . - Continue scheduled DuoNebs twice daily. - Status post IV Solu-Medrol  40 mg x 1. -Status post Lasix  40 mg IV x 1 with urine output of 2.950 L over the past 24 hours. - Follow-up..    DVT prophylaxis: Lovenox  Code Status: Full Family Communication: Updated patient and husband at bedside. Disposition: Medically stable.  Awaiting SNF placement.    Status is: Inpatient Remains inpatient appropriate because: Severity of illness   Consultants:  Orthopedics ID: Dr.Vu 10/21/2024  Procedures:  CT head 10/17/2024 CT C-spine 10/17/2024 CT abdomen and pelvis 10/17/2024 CT right foot 10/20/2024 Plan films of the right foot 10/19/2024 Plain films of the left hand 10/17/2024 Plain films of the right tib-fib 10/17/2024 Chest x-ray 10/17/2024  Antimicrobials:  Anti-infectives (From admission, onward)    Start     Dose/Rate Route  Frequency Ordered Stop   10/21/24 1415  vancomycin  (VANCOCIN ) capsule 125 mg        125 mg Oral 2 times daily 10/21/24 1318 10/28/24 0959   10/20/24 2000  ciprofloxacin  (CIPRO ) tablet 500 mg  Status:  Discontinued        500 mg Oral 2 times daily 10/20/24 1635 10/20/24 2050   10/19/24 1130  ceFEPIme  (MAXIPIME ) 2 g in sodium chloride  0.9 % 100 mL IVPB  Status:  Discontinued        2 g 200 mL/hr over 30 Minutes Intravenous Every 8 hours 10/19/24 1031 10/20/24 1634   10/18/24 1000  cefTRIAXone  (ROCEPHIN ) 2 g in sodium chloride  0.9 % 100 mL IVPB  Status:  Discontinued        2 g 200 mL/hr over 30 Minutes Intravenous Every 24 hours 10/17/24 1330 10/19/24 0957   10/17/24 2200  metroNIDAZOLE  (FLAGYL ) IVPB 500 mg  Status:  Discontinued        500 mg 100 mL/hr over 60 Minutes Intravenous Every 12 hours 10/17/24 1330 10/21/24 0932   10/17/24 1800  vancomycin  (VANCOCIN ) capsule 125 mg  Status:  Discontinued        125 mg Oral 4 times daily 10/17/24 1542 10/21/24 0932   10/17/24 1015  cefTRIAXone  (ROCEPHIN ) 2 g in sodium chloride  0.9 % 100 mL IVPB       Placed in And Linked Group  2 g 200 mL/hr over 30 Minutes Intravenous  Once 10/17/24 1009 10/17/24 1153   10/17/24 1015  metroNIDAZOLE  (FLAGYL ) IVPB 500 mg       Placed in And Linked Group   500 mg 100 mL/hr over 60 Minutes Intravenous  Once 10/17/24 1009 10/17/24 1117   10/17/24 1015  vancomycin  (VANCOCIN ) capsule 125 mg        125 mg Oral  Once 10/17/24 1009 10/17/24 1455         Subjective: Patient sitting up in bed.  States she feels much better.  Wheezing has improved.  Denies any shortness of breath.  No abdominal pain.  Tolerating current diet.  States right foot pain improving.  Husband at bedside.  Patient states saw the orthopedic doctor today.  Objective: Vitals:   10/22/24 2124 10/23/24 0510 10/23/24 0846 10/23/24 1035  BP: 134/68 129/70  132/78  Pulse: 84 62    Resp:  19    Temp: 98.5 F (36.9 C) 97.9 F (36.6 C)     TempSrc: Oral     SpO2: 91% 98% 97%   Weight:      Height:        Intake/Output Summary (Last 24 hours) at 10/23/2024 1233 Last data filed at 10/23/2024 0544 Gross per 24 hour  Intake 240 ml  Output 2550 ml  Net -2310 ml   Filed Weights   10/17/24 0736  Weight: 88.5 kg    Examination:  General exam: NAD. Respiratory system: CTAB.  No wheezes, no crackles, no rhonchi.  Fair air movement.  Speaking in full sentences.  No use of accessory muscles of respiration.  Cardiovascular system: Regular rate and rhythm no murmurs rubs or gallops.  No JVD.  No lower extremity edema.  Gastrointestinal system: Abdomen is soft, nontender, nondistended, positive bowel sounds.  No rebound.  No guarding.   Central nervous system: Alert and oriented. No focal neurological deficits. Extremities: Right foot significantly less tender to palpation.  No edema.  Symmetric 5 x 5 power. Skin: No rashes, lesions or ulcers Psychiatry: Judgement and insight appear normal. Mood & affect appropriate.     Data Reviewed: I have personally reviewed following labs and imaging studies  CBC: Recent Labs  Lab 10/17/24 1451 10/19/24 0705 10/20/24 0431 10/21/24 0436 10/22/24 0351  WBC 26.3* 13.0* 10.4 9.6 7.9  NEUTROABS 22.6*  --   --   --   --   HGB 11.8* 11.2* 10.2* 10.0* 11.9*  HCT 38.6 35.7* 32.6* 31.3* 37.2  MCV 97.0 96.2 95.0 93.2 92.8  PLT 222 157 163 147* 239    Basic Metabolic Panel: Recent Labs  Lab 10/18/24 1651 10/19/24 0705 10/20/24 0431 10/21/24 0436 10/22/24 0351 10/23/24 0422  NA  --  137 138 137 137 140  K  --  3.8 3.4* 3.6 3.8 3.5  CL  --  101 103 100 97* 99  CO2  --  28 29 29 30  35*  GLUCOSE  --  82 93 87 145* 91  BUN  --  11 7* 6* <5* 14  CREATININE  --  0.67 0.60 0.58 0.51 0.64  CALCIUM   --  9.2 8.9 8.9 9.5 9.3  MG 1.7  --  1.8 2.0 1.9 1.9    GFR: Estimated Creatinine Clearance: 61.9 mL/min (by C-G formula based on SCr of 0.64 mg/dL).  Liver Function Tests: Recent  Labs  Lab 10/17/24 0748 10/17/24 1451  AST 25 35  ALT 16 18  ALKPHOS 144* 144*  BILITOT 0.5 0.4  PROT 6.8 5.9*  ALBUMIN 4.1 3.6    CBG: Recent Labs  Lab 10/17/24 0753  GLUCAP 126*     Recent Results (from the past 240 hours)  Resp panel by RT-PCR (RSV, Flu A&B, Covid) Anterior Nasal Swab     Status: None   Collection Time: 10/17/24  8:04 AM   Specimen: Anterior Nasal Swab  Result Value Ref Range Status   SARS Coronavirus 2 by RT PCR NEGATIVE NEGATIVE Final    Comment: (NOTE) SARS-CoV-2 target nucleic acids are NOT DETECTED.  The SARS-CoV-2 RNA is generally detectable in upper respiratory specimens during the acute phase of infection. The lowest concentration of SARS-CoV-2 viral copies this assay can detect is 138 copies/mL. A negative result does not preclude SARS-Cov-2 infection and should not be used as the sole basis for treatment or other patient management decisions. A negative result may occur with  improper specimen collection/handling, submission of specimen other than nasopharyngeal swab, presence of viral mutation(s) within the areas targeted by this assay, and inadequate number of viral copies(<138 copies/mL). A negative result must be combined with clinical observations, patient history, and epidemiological information. The expected result is Negative.  Fact Sheet for Patients:  bloggercourse.com  Fact Sheet for Healthcare Providers:  seriousbroker.it  This test is no t yet approved or cleared by the [place]  FDA and  has been authorized for detection and/or diagnosis of SARS-CoV-2 by FDA under an Emergency Use Authorization (EUA). This EUA will remain  in effect (meaning this test can be used) for the duration of the COVID-19 declaration under Section 564(b)(1) of the Act, 21 U.S.C.section 360bbb-3(b)(1), unless the authorization is terminated  or revoked sooner.       Influenza A by PCR  NEGATIVE NEGATIVE Final   Influenza B by PCR NEGATIVE NEGATIVE Final    Comment: (NOTE) The Xpert Xpress SARS-CoV-2/FLU/RSV plus assay is intended as an aid in the diagnosis of influenza from Nasopharyngeal swab specimens and should not be used as a sole basis for treatment. Nasal washings and aspirates are unacceptable for Xpert Xpress SARS-CoV-2/FLU/RSV testing.  Fact Sheet for Patients: bloggercourse.com  Fact Sheet for Healthcare Providers: seriousbroker.it  This test is not yet approved or cleared by the [place]  FDA and has been authorized for detection and/or diagnosis of SARS-CoV-2 by FDA under an Emergency Use Authorization (EUA). This EUA will remain in effect (meaning this test can be used) for the duration of the COVID-19 declaration under Section 564(b)(1) of the Act, 21 U.S.C. section 360bbb-3(b)(1), unless the authorization is terminated or revoked.     Resp Syncytial Virus by PCR NEGATIVE NEGATIVE Final    Comment: (NOTE) Fact Sheet for Patients: bloggercourse.com  Fact Sheet for Healthcare Providers: seriousbroker.it  This test is not yet approved or cleared by the [place]  FDA and has been authorized for detection and/or diagnosis of SARS-CoV-2 by FDA under an Emergency Use Authorization (EUA). This EUA will remain in effect (meaning this test can be used) for the duration of the COVID-19 declaration under Section 564(b)(1) of the Act, 21 U.S.C. section 360bbb-3(b)(1), unless the authorization is terminated or revoked.  Performed at [facility], [address] [place], [place] [address]   Blood Culture (routine x 2)     Status: None   Collection Time: 10/17/24  8:48 AM   Specimen: BLOOD  Result Value Ref Range Status   Specimen Description   Final    BLOOD BLOOD RIGHT FOREARM Performed  at [facility],  [address] [place], [place] [address]    Special Requests   Final    BOTTLES DRAWN AEROBIC AND ANAEROBIC Blood Culture results may not be optimal due to an inadequate volume of blood received in culture bottles Performed at [facility], [address], [place], [place] [address]    Culture   Final    NO GROWTH 5 DAYS Performed at [facility], [address], [place], [place] [address]    Report Status 10/22/2024 FINAL  Final  Blood Culture (routine x 2)     Status: None   Collection Time: 10/17/24  8:56 AM   Specimen: BLOOD  Result Value Ref Range Status   Specimen Description   Final    BLOOD RIGHT ANTECUBITAL Performed at [facility], [address], [place], [place] [address]    Special Requests   Final    BOTTLES DRAWN AEROBIC AND ANAEROBIC Blood Culture adequate volume Performed at [facility], [address], [place], [place] [address]    Culture   Final    NO GROWTH 5 DAYS Performed at [facility], [address], [place], [place] [address]    Report Status 10/22/2024 FINAL  Final  Urine Culture (for pregnant, neutropenic or urologic patients or patients with an indwelling urinary catheter)     Status: Abnormal   Collection Time: 10/17/24  9:25 AM   Specimen: Urine, Clean Catch  Result Value Ref Range Status   Specimen Description   Final    URINE, CLEAN CATCH Performed at [facility], [address], [place], [place] [address]    Special Requests   Final    NONE Performed at [facility], [address] [place], [place] [address]    Culture (A)  Final    30,000 COLONIES/mL PSEUDOMONAS AERUGINOSA 30,000 COLONIES/mL ENTEROCOCCUS FAECIUM    Report Status 10/21/2024 FINAL  Final   Organism ID, Bacteria PSEUDOMONAS AERUGINOSA (A)  Final   Organism ID, Bacteria ENTEROCOCCUS FAECIUM (A)  Final      Susceptibility   Enterococcus faecium - MIC*     AMPICILLIN  <=2 SENSITIVE Sensitive     NITROFURANTOIN  64 INTERMEDIATE Intermediate     VANCOMYCIN  <=0.5 SENSITIVE Sensitive     * 30,000 COLONIES/mL ENTEROCOCCUS FAECIUM   Pseudomonas aeruginosa - MIC*    MEROPENEM <=0.25 SENSITIVE Sensitive     CIPROFLOXACIN  0.5 SENSITIVE Sensitive     IMIPENEM 1 SENSITIVE Sensitive     PIP/TAZO Value in next row Sensitive      8 SENSITIVEThis is a modified FDA-approved test that has been validated and its performance characteristics determined by the reporting laboratory.  This laboratory is certified under the Clinical Laboratory Improvement Amendments CLIA as qualified to perform high complexity clinical laboratory testing.    CEFTAZIDIME/AVIBACTAM Value in next row Sensitive      8 SENSITIVEThis is a modified FDA-approved test that has been validated and its performance characteristics determined by the reporting laboratory.  This laboratory is certified under the Clinical Laboratory Improvement Amendments CLIA as qualified to perform high complexity clinical laboratory testing.    CEFTOLOZANE/TAZOBACTAM Value in next row Sensitive      8 SENSITIVEThis is a modified FDA-approved test that has been validated and its performance characteristics determined by the reporting laboratory.  This laboratory is certified under the Clinical Laboratory Improvement Amendments CLIA as qualified to perform high complexity clinical laboratory  testing.    TOBRAMYCIN Value in next row Sensitive      8 SENSITIVEThis is a modified FDA-approved test that has been validated and its performance characteristics determined by the reporting laboratory.  This laboratory is certified under the Clinical Laboratory Improvement Amendments CLIA as qualified to perform high complexity clinical laboratory testing.    CEFTAZIDIME Value in next row Sensitive      8 SENSITIVEThis is a modified FDA-approved test that has been validated and its performance characteristics determined by the reporting  laboratory.  This laboratory is certified under the Clinical Laboratory Improvement Amendments CLIA as qualified to perform high complexity clinical laboratory testing.    * 30,000 COLONIES/mL PSEUDOMONAS AERUGINOSA  C Difficile Quick Screen w PCR reflex     Status: None   Collection Time: 10/18/24 12:47 PM   Specimen: STOOL  Result Value Ref Range Status   C Diff antigen NEGATIVE NEGATIVE Final   C Diff toxin NEGATIVE NEGATIVE Final   C Diff interpretation No C. difficile detected.  Final    Comment: Performed at [facility], [address], [place], [place] [address]  Gastrointestinal Panel by PCR , Stool     Status: Abnormal   Collection Time: 10/18/24 12:47 PM   Specimen: STOOL  Result Value Ref Range Status   Campylobacter species NOT DETECTED NOT DETECTED Final   Plesimonas shigelloides NOT DETECTED NOT DETECTED Final   Salmonella species NOT DETECTED NOT DETECTED Final   Yersinia enterocolitica NOT DETECTED NOT DETECTED Final   Vibrio species NOT DETECTED NOT DETECTED Final   Vibrio cholerae NOT DETECTED NOT DETECTED Final   Enteroaggregative E coli (EAEC) NOT DETECTED NOT DETECTED Final   Enteropathogenic E coli (EPEC) NOT DETECTED NOT DETECTED Final   Enterotoxigenic E coli (ETEC) DETECTED (A) NOT DETECTED Final    Comment: RESULT CALLED TO, READ BACK BY AND VERIFIED WITH: HILLARY MASHBURN 10/20/24 1219 MU    Shiga like toxin producing E coli (STEC) NOT DETECTED NOT DETECTED Final   Shigella/Enteroinvasive E coli (EIEC) NOT DETECTED NOT DETECTED Final   Cryptosporidium NOT DETECTED NOT DETECTED Final   Cyclospora cayetanensis NOT DETECTED NOT DETECTED Final   Entamoeba histolytica NOT DETECTED NOT DETECTED Final   Giardia lamblia NOT DETECTED NOT DETECTED Final   Adenovirus F40/41 NOT DETECTED NOT DETECTED Final   Astrovirus NOT DETECTED NOT DETECTED Final   Norovirus GI/GII NOT DETECTED NOT DETECTED Final   Rotavirus A NOT DETECTED NOT DETECTED Final    Sapovirus (I, II, IV, and V) NOT DETECTED NOT DETECTED Final    Comment: Performed at [facility], [address] [place], [place] [address]         Radiology Studies: No results found.       Scheduled Meds:  aspirin  EC  325 mg Oral Daily   budesonide -glycopyrrolate -formoterol   2 puff Inhalation BID   buPROPion   150 mg Oral Daily   enoxaparin  (LOVENOX ) injection  40 mg Subcutaneous Q24H   escitalopram   20 mg Oral Daily   famotidine   40 mg Oral Daily   fluticasone   1 spray Each Nare Daily   gabapentin   100 mg Oral BID   gabapentin   300 mg Oral QHS   ipratropium-albuterol   3 mL Nebulization BID   levothyroxine   50 mcg Oral QAC breakfast   LORazepam   1 mg Oral QHS   melatonin  5 mg Oral QHS   montelukast   10 mg Oral QHS   vancomycin   125 mg Oral  BID   verapamil   360 mg Oral Daily   Continuous Infusions:     LOS: 6 days    Time spent: 40 minutes    Toribio Hummer, MD [facility]   To contact the attending provider between 7A-7P or the covering provider during after hours 7P-7A, please log into the web site [email or web address] and access using universal [place] password for that web site. If you do not have the password, please call the hospital operator.  10/23/2024, 12:33 PM    "

## 2024-10-23 NOTE — Progress Notes (Signed)
" °   10/23/24 2110  BiPAP/CPAP/SIPAP  BiPAP/CPAP/SIPAP Pt Type Adult  BiPAP/CPAP/SIPAP DREAMSTATIOND  Mask Type Nasal mask  Dentures removed? Not applicable  Flow Rate 4 lpm  Patient Home Machine No  Patient Home Mask Yes  Patient Home Tubing No  Auto Titrate Yes  Minimum cmH2O 5 cmH2O  Maximum cmH2O 20 cmH2O  CPAP/SIPAP surface wiped down Yes  Device Plugged into RED Power Outlet Yes  BiPAP/CPAP /SiPAP Vitals  Pulse Rate 62  SpO2 98 %  Bilateral Breath Sounds Clear;Diminished  MEWS Score/Color  MEWS Score 0  MEWS Score Color Green    "

## 2024-10-23 NOTE — TOC Progression Note (Signed)
 Transition of Care [facility]) - Progression Note    Patient Details  Name: Courtney [name] MRN: [id number] Date of Birth: 05/24/1946  [facility] of [facility] CM/SW Contact  Sonda Manuella Quill, RN Phone Number: 10/23/2024, 12:37 PM  Clinical Narrative:    Parry Dickens; received message The current date falls outside of the patient's eligibility dates. You may only create an auth inside of these dates; will follow up; Dr Sebastian notified.   Expected Discharge Plan:  (TBD) Barriers to Discharge: Continued Medical Work up               Expected Discharge Plan and Services In-house Referral: Clinical Social Work     Living arrangements for the past 2 months: Independent Press Photographer                                       Social Drivers of Health (SDOH) Interventions SDOH Screenings   Food Insecurity: No Food Insecurity (10/19/2024)  Housing: Low Risk (10/19/2024)  Transportation Needs: No Transportation Needs (10/19/2024)  Utilities: Not At Risk (10/19/2024)  Social Connections: Moderately Integrated (10/19/2024)  Tobacco Use: Low Risk (10/17/2024)    Readmission Risk Interventions    10/21/2024   12:32 PM 04/13/2024    3:55 PM  Readmission Risk Prevention Plan  Transportation Screening Complete Complete  PCP or Specialist Appt within 5-7 Days  Complete  Home Care Screening  Complete  Medication Review (RN CM)  Complete  HRI or Home Care Consult Complete   Social Work Consult for Recovery Care Planning/Counseling Complete   Palliative Care Screening Not Applicable   Medication Review Oceanographer) Complete

## 2024-10-23 NOTE — Plan of Care (Signed)

## 2024-10-24 ENCOUNTER — Inpatient Hospital Stay (HOSPITAL_COMMUNITY)

## 2024-10-24 DIAGNOSIS — I2510 Atherosclerotic heart disease of native coronary artery without angina pectoris: Secondary | ICD-10-CM | POA: Diagnosis not present

## 2024-10-24 DIAGNOSIS — E876 Hypokalemia: Secondary | ICD-10-CM | POA: Diagnosis not present

## 2024-10-24 DIAGNOSIS — A419 Sepsis, unspecified organism: Secondary | ICD-10-CM | POA: Diagnosis not present

## 2024-10-24 DIAGNOSIS — I10 Essential (primary) hypertension: Secondary | ICD-10-CM | POA: Diagnosis not present

## 2024-10-24 MED ORDER — POTASSIUM CHLORIDE CRYS ER 20 MEQ PO TBCR
40.0000 meq | EXTENDED_RELEASE_TABLET | Freq: Once | ORAL | Status: AC
  Administered 2024-10-24: 40 meq via ORAL
  Filled 2024-10-24: qty 2

## 2024-10-24 MED ORDER — FUROSEMIDE 10 MG/ML IJ SOLN
20.0000 mg | Freq: Once | INTRAMUSCULAR | Status: AC
  Administered 2024-10-24: 20 mg via INTRAVENOUS
  Filled 2024-10-24: qty 2

## 2024-10-24 MED ORDER — FUROSEMIDE 10 MG/ML IJ SOLN
40.0000 mg | Freq: Once | INTRAMUSCULAR | Status: AC
  Administered 2024-10-24: 40 mg via INTRAVENOUS
  Filled 2024-10-24: qty 4

## 2024-10-24 NOTE — Progress Notes (Signed)
 " PROGRESS NOTE    Courtney [name]  FMW:[id number] DOB: 12-29-1945 DOA: 10/17/2024 PCP: Ransom Other, MD   Chief Complaint  Patient presents with   Fall   Near Syncope    Brief Narrative:  Courtney [name] is a 79 y.o. female with medical history significant of anemia, hyperlipidemia, aortic atherosclerosis, aortic calcification, osteoarthritis of the wrist, asthma, bipolar disorder, depression, hiatal hernia, nephrolithiasis, hypertension, hypothyroidism, mitral valve prolapse, OSA on CPAP, pulmonary embolism, history of CVA with residual right-sided hemiparesis, thrombophlebitis, vertigo, multiple episodes of C. difficile colitis, diverticulosis, diverticulitis who started having diarrhea multiple episodes yesterday and is coming to the emergency department after having a fall at her ALF. ... See H&P for full HPI on admission & ED course.   ED course was most notable for significant leukocytosis WBC 24, negative viral PCRs, lactic acid 3.1.  CT of abdomen and pelvis showed acute colitis affecting the transverse colon and splenic flexure.  UA was also concerning for infection, urine culture has been added on.   Patient was admitted to the hospital on afternoon of 10/17/2024 for further evaluation and management of sepsis with acute on chronic colitis, AKI, hypokalemia as outlined in detail below.   Assessment & Plan:   Principal Problem:   Sepsis due to undetermined organism [facility]) Active Problems:   Hypertension   Dyslipidemia   OSA (obstructive sleep apnea)   CAD (coronary artery disease)   AKI (acute kidney injury)   Hypokalemia   Acute on chronic colitis   Intestinal infection due to enterotoxigenic E. coli  #1 sepsis due to undetermined organism in the setting of acute on chronic colitis/UTI/enterotoxigenic E. coli -Initial antibiotics started on admission for IV Rocephin , IV Flagyl  empiric oral vancomycin  apparently as recommended by her primary GI for prevention of  recurrent C. difficile. - Urine cultures did grow 30,000 colonies of Pseudomonas. - IV Rocephin  changed to cefepime  which was subsequently discontinued.. - Flagyl  discontinued. - Diet advanced to soft diet which patient is tolerating. - IV antiemetics as needed - Analgesics as needed. - Stool studies positive for enterotoxigenic E. coli. - IV cefepime  initially was changed to ciprofloxacin  and Cipro  subsequently discontinued as CT of the right foot with thickening of the distal Achilles tendon reflecting tendinosis with calcification within the distal Achilles tendon may be related to prior trauma. -Patient seen in consultation by ID who recommended discontinuation of systemic antibiotics, continuation of twice daily p.o. Vanco 125 mg x 7 more days until 10/28/2024, continuation of standard enteric isolation precautions. -ID recommended stopping Nexium if patient does not have PUD or Barrett's esophagus as his high risk for C. difficile. -PPI discontinued and patient placed on Pepcid . -Supportive care. - Follow.  2.  AKI-resolved -Likely secondary to a prerenal azotemia. - Resolved with hydration.  - Monitor with diuresis.  3.  Right foot pain -Patient noted to have suffered a fall prior to admission and presented with persistent right foot pain not previously imaged. - Patient with requisite tenderness over the dorsal midfoot. - X-rays ordered showing prior surgical changes with 1 screw confirmed as broken and a second screw suspected to be broken. - CT of the right foot ordered per orthopedics showed postsurgical changes at the base of the first metatarsal with 2 fractured screws, 1 of which demonstrates up to 5 mm of displacement.  No appreciable acute fracture involving the visualized bones.  No dislocation.  Hallux valgus with mild to moderate degenerative changes of the first MTP joint.  Moderate  degenerative changes at the first metatarsal head and hallux sesamoid articulation.  Mild to  moderate OA throughout the hindfoot and midfoot.  Well-corticated ossicle at the dorsal aspect of the Lisfranc joint.  Thickening of the distal Achilles tendon may reflect tendinosis..  Calcification within the distal Achilles tendon may relate to prior trauma. - Per orthopedic note x-ray and CT scans reviewed and no acute fracture noted, 2 broken screws seen in the first metatarsal, unclear as to timing of hardware failure.   - Orthopedics recommended WBAT RLE in postop shoe with walker.  PT/OT. - Outpatient follow-up with orthopedics.   4.  Hypokalemia - Likely secondary to GI losses and diuresis..   - Potassium at 3.5.   - Magnesium  at 1.9.   - Repeat labs in the AM.    5.  QTc prolongation -Repeat EKG with resolution of QTc prolongation.  6.  Hypertension - Continue verapamil .   7.  Hyperlipidemia - Statin on hold and will resume on discharge.  8. OSA - CPAP nightly.   9.  CAD - Aspirin .   - Statin on hold and will resume on discharge.   10.  History of asthma per patient -Patient with some expiratory wheezing noted on exam. - Continue Breztri . - Continue scheduled DuoNebs twice daily. - Status post IV Solu-Medrol  40 mg x 1. -Status post Lasix  40 mg IV x 1 with good urine output. - Patient noted with some hypoxia this morning, patient with some bibasilar crackles on exam and as such we will give Lasix  40 mg IV x 1.   - Follow.  11.  Hypoxia -Likely secondary to volume overload as patient received fluid resuscitation on presentation. - Patient with bibasilar crackles noted on exam. - Check a chest x-ray. - Lasix  40 mg IV x 1. - Check ambulatory sats tomorrow.    DVT prophylaxis: Lovenox  Code Status: Full Family Communication: Updated patient and husband at bedside.  Disposition: Medically stable.  Awaiting SNF placement.    Status is: Inpatient Remains inpatient appropriate because: Severity of illness   Consultants:  Orthopedics ID: Dr.[email or web address]  10/21/2024  Procedures:  CT head 10/17/2024 CT C-spine 10/17/2024 CT abdomen and pelvis 10/17/2024 CT right foot 10/20/2024 Plan films of the right foot 10/19/2024 Plain films of the left hand 10/17/2024 Plain films of the right tib-fib 10/17/2024 Chest x-ray 10/17/2024  Antimicrobials:  Anti-infectives (From admission, onward)    Start     Dose/Rate Route Frequency Ordered Stop   10/23/24 2200  nitrofurantoin  (MACRODANTIN ) capsule 100 mg  Status:  Discontinued        100 mg Oral Daily at bedtime 10/23/24 1819 10/23/24 1834   10/23/24 2200  nitrofurantoin  (macrocrystal-monohydrate) (MACROBID ) capsule 100 mg        100 mg Oral Daily at bedtime 10/23/24 1834     10/21/24 1415  vancomycin  (VANCOCIN ) capsule 125 mg        125 mg Oral 2 times daily 10/21/24 1318 10/28/24 0959   10/20/24 2000  ciprofloxacin  (CIPRO ) tablet 500 mg  Status:  Discontinued        500 mg Oral 2 times daily 10/20/24 1635 10/20/24 2050   10/19/24 1130  ceFEPIme  (MAXIPIME ) 2 g in sodium chloride  0.9 % 100 mL IVPB  Status:  Discontinued        2 g 200 mL/hr over 30 Minutes Intravenous Every 8 hours 10/19/24 1031 10/20/24 1634   10/18/24 1000  cefTRIAXone  (ROCEPHIN ) 2 g in sodium chloride  0.9 % 100 mL IVPB  Status:  Discontinued        2 g 200 mL/hr over 30 Minutes Intravenous Every 24 hours 10/17/24 1330 10/19/24 0957   10/17/24 2200  metroNIDAZOLE  (FLAGYL ) IVPB 500 mg  Status:  Discontinued        500 mg 100 mL/hr over 60 Minutes Intravenous Every 12 hours 10/17/24 1330 10/21/24 0932   10/17/24 1800  vancomycin  (VANCOCIN ) capsule 125 mg  Status:  Discontinued        125 mg Oral 4 times daily 10/17/24 1542 10/21/24 0932   10/17/24 1015  cefTRIAXone  (ROCEPHIN ) 2 g in sodium chloride  0.9 % 100 mL IVPB       Placed in And Linked Group   2 g 200 mL/hr over 30 Minutes Intravenous  Once 10/17/24 1009 10/17/24 1153   10/17/24 1015  metroNIDAZOLE  (FLAGYL ) IVPB 500 mg       Placed in And Linked Group   500 mg 100  mL/hr over 60 Minutes Intravenous  Once 10/17/24 1009 10/17/24 1117   10/17/24 1015  vancomycin  (VANCOCIN ) capsule 125 mg        125 mg Oral  Once 10/17/24 1009 10/17/24 1455         Subjective: Patient states had some shortness of breath this morning.  Unable to use CPAP due to mask overnight.  Denies any chest pain.  No abdominal pain.  Husband at bedside.    Objective: Vitals:   10/24/24 0900 10/24/24 0924 10/24/24 1013 10/24/24 1114  BP:      Pulse:      Resp:   (!) 24   Temp:      TempSrc:      SpO2: (!) 82% 98% 98% 98%  Weight:      Height:        Intake/Output Summary (Last 24 hours) at 10/24/2024 1305 Last data filed at 10/24/2024 9072 Gross per 24 hour  Intake 240 ml  Output 200 ml  Net 40 ml   Filed Weights   10/17/24 0736  Weight: 88.5 kg    Examination:  General exam: NAD. Respiratory system: Some bibasilar crackles.  No wheezing, no crackles, no rhonchi.  Fair air movement.  Speaking in full sentences.  No use of accessory muscles of respiration.   Cardiovascular system: RRR no murmurs rubs or gallops.  No JVD.  No lower extremity edema.  Gastrointestinal system: Abdomen is soft, nontender, nondistended, positive bowel sounds.  No rebound.  No guarding.  Central nervous system: Alert and oriented. No focal neurological deficits. Extremities: Right foot significantly less tender to palpation.  No edema.  Symmetric 5 x 5 power. Skin: No rashes, lesions or ulcers Psychiatry: Judgement and insight appear normal. Mood & affect appropriate.     Data Reviewed: I have personally reviewed following labs and imaging studies  CBC: Recent Labs  Lab 10/17/24 1451 10/19/24 0705 10/20/24 0431 10/21/24 0436 10/22/24 0351  WBC 26.3* 13.0* 10.4 9.6 7.9  NEUTROABS 22.6*  --   --   --   --   HGB 11.8* 11.2* 10.2* 10.0* 11.9*  HCT 38.6 35.7* 32.6* 31.3* 37.2  MCV 97.0 96.2 95.0 93.2 92.8  PLT 222 157 163 147* 239    Basic Metabolic Panel: Recent Labs  Lab  10/18/24 1651 10/19/24 0705 10/20/24 0431 10/21/24 0436 10/22/24 0351 10/23/24 0422  NA  --  137 138 137 137 140  K  --  3.8 3.4* 3.6 3.8 3.5  CL  --  101 103 100 97* 99  CO2  --  28 29 29 30  35*  GLUCOSE  --  82 93 87 145* 91  BUN  --  11 7* 6* <5* 14  CREATININE  --  0.67 0.60 0.58 0.51 0.64  CALCIUM   --  9.2 8.9 8.9 9.5 9.3  MG 1.7  --  1.8 2.0 1.9 1.9    GFR: Estimated Creatinine Clearance: 61.9 mL/min (by C-G formula based on SCr of 0.64 mg/dL).  Liver Function Tests: Recent Labs  Lab 10/17/24 1451  AST 35  ALT 18  ALKPHOS 144*  BILITOT 0.4  PROT 5.9*  ALBUMIN 3.6    CBG: No results for input(s): GLUCAP in the last 168 hours.    Recent Results (from the past 240 hours)  Resp panel by RT-PCR (RSV, Flu A&B, Covid) Anterior Nasal Swab     Status: None   Collection Time: 10/17/24  8:04 AM   Specimen: Anterior Nasal Swab  Result Value Ref Range Status   SARS Coronavirus 2 by RT PCR NEGATIVE NEGATIVE Final    Comment: (NOTE) SARS-CoV-2 target nucleic acids are NOT DETECTED.  The SARS-CoV-2 RNA is generally detectable in upper respiratory specimens during the acute phase of infection. The lowest concentration of SARS-CoV-2 viral copies this assay can detect is 138 copies/mL. A negative result does not preclude SARS-Cov-2 infection and should not be used as the sole basis for treatment or other patient management decisions. A negative result may occur with  improper specimen collection/handling, submission of specimen other than nasopharyngeal swab, presence of viral mutation(s) within the areas targeted by this assay, and inadequate number of viral copies(<138 copies/mL). A negative result must be combined with clinical observations, patient history, and epidemiological information. The expected result is Negative.  Fact Sheet for Patients:  bloggercourse.com  Fact Sheet for Healthcare Providers:   seriousbroker.it  This test is no t yet approved or cleared by the [place]  FDA and  has been authorized for detection and/or diagnosis of SARS-CoV-2 by FDA under an Emergency Use Authorization (EUA). This EUA will remain  in effect (meaning this test can be used) for the duration of the COVID-19 declaration under Section 564(b)(1) of the Act, 21 U.S.C.section 360bbb-3(b)(1), unless the authorization is terminated  or revoked sooner.       Influenza A by PCR NEGATIVE NEGATIVE Final   Influenza B by PCR NEGATIVE NEGATIVE Final    Comment: (NOTE) The Xpert Xpress SARS-CoV-2/FLU/RSV plus assay is intended as an aid in the diagnosis of influenza from Nasopharyngeal swab specimens and should not be used as a sole basis for treatment. Nasal washings and aspirates are unacceptable for Xpert Xpress SARS-CoV-2/FLU/RSV testing.  Fact Sheet for Patients: bloggercourse.com  Fact Sheet for Healthcare Providers: seriousbroker.it  This test is not yet approved or cleared by the [place]  FDA and has been authorized for detection and/or diagnosis of SARS-CoV-2 by FDA under an Emergency Use Authorization (EUA). This EUA will remain in effect (meaning this test can be used) for the duration of the COVID-19 declaration under Section 564(b)(1) of the Act, 21 U.S.C. section 360bbb-3(b)(1), unless the authorization is terminated or revoked.     Resp Syncytial Virus by PCR NEGATIVE NEGATIVE Final    Comment: (NOTE) Fact Sheet for Patients: bloggercourse.com  Fact Sheet for Healthcare Providers: seriousbroker.it  This test is not yet approved or cleared by the [place]  FDA and has been authorized for detection and/or diagnosis of SARS-CoV-2 by FDA under an Emergency Use Authorization (EUA). This EUA  will remain in effect (meaning this test can be used) for  the duration of the COVID-19 declaration under Section 564(b)(1) of the Act, 21 U.S.C. section 360bbb-3(b)(1), unless the authorization is terminated or revoked.  Performed at [facility], [address] [place], [place] [address]   Blood Culture (routine x 2)     Status: None   Collection Time: 10/17/24  8:48 AM   Specimen: BLOOD  Result Value Ref Range Status   Specimen Description   Final    BLOOD BLOOD RIGHT FOREARM Performed at [facility], [address], [place], [place] [address]    Special Requests   Final    BOTTLES DRAWN AEROBIC AND ANAEROBIC Blood Culture results may not be optimal due to an inadequate volume of blood received in culture bottles Performed at [facility], [address], [place], [place] [address]    Culture   Final    NO GROWTH 5 DAYS Performed at [facility], [address], [place], [place] [address]    Report Status 10/22/2024 FINAL  Final  Blood Culture (routine x 2)     Status: None   Collection Time: 10/17/24  8:56 AM   Specimen: BLOOD  Result Value Ref Range Status   Specimen Description   Final    BLOOD RIGHT ANTECUBITAL Performed at [facility], [address] [place], [place] [address]    Special Requests   Final    BOTTLES DRAWN AEROBIC AND ANAEROBIC Blood Culture adequate volume Performed at [facility], [address], [place], [place] [address]    Culture   Final    NO GROWTH 5 DAYS Performed at [facility], [address] [place], [place] [address]    Report Status 10/22/2024 FINAL  Final  Urine Culture (for pregnant, neutropenic or urologic patients or patients with an indwelling urinary catheter)     Status: Abnormal   Collection Time: 10/17/24  9:25 AM   Specimen: Urine, Clean Catch  Result Value Ref Range Status   Specimen Description   Final    URINE, CLEAN CATCH Performed at [facility], [address], [place], [place] [address]    Special Requests   Final    NONE Performed at [facility], [address] [place], [place] [address]    Culture (A)  Final    30,000 COLONIES/mL PSEUDOMONAS AERUGINOSA 30,000 COLONIES/mL ENTEROCOCCUS FAECIUM    Report Status 10/21/2024 FINAL  Final   Organism ID, Bacteria PSEUDOMONAS AERUGINOSA (A)  Final   Organism ID, Bacteria ENTEROCOCCUS FAECIUM (A)  Final      Susceptibility   Enterococcus faecium - MIC*    AMPICILLIN  <=2 SENSITIVE Sensitive     NITROFURANTOIN  64 INTERMEDIATE Intermediate     VANCOMYCIN  <=0.5 SENSITIVE Sensitive     * 30,000 COLONIES/mL ENTEROCOCCUS FAECIUM   Pseudomonas aeruginosa - MIC*    MEROPENEM <=0.25 SENSITIVE Sensitive     CIPROFLOXACIN  0.5 SENSITIVE Sensitive     IMIPENEM 1 SENSITIVE Sensitive     PIP/TAZO Value in next row Sensitive      8 SENSITIVEThis is a modified FDA-approved test that has been validated and its performance characteristics determined by the reporting laboratory.  This laboratory is certified under the Clinical Laboratory Improvement Amendments CLIA as qualified to perform high complexity clinical laboratory testing.    CEFTAZIDIME/AVIBACTAM Value in next row Sensitive      8  SENSITIVEThis is a modified FDA-approved test that has been validated and its performance characteristics determined by the reporting laboratory.  This laboratory is certified under the Clinical Laboratory Improvement Amendments CLIA as qualified to perform high complexity clinical laboratory testing.    CEFTOLOZANE/TAZOBACTAM Value in next row Sensitive      8 SENSITIVEThis is a modified FDA-approved test that has been validated and its performance characteristics determined by the reporting laboratory.  This laboratory is certified under the Clinical Laboratory Improvement Amendments CLIA as qualified to perform high complexity clinical laboratory testing.    TOBRAMYCIN Value in next  row Sensitive      8 SENSITIVEThis is a modified FDA-approved test that has been validated and its performance characteristics determined by the reporting laboratory.  This laboratory is certified under the Clinical Laboratory Improvement Amendments CLIA as qualified to perform high complexity clinical laboratory testing.    CEFTAZIDIME Value in next row Sensitive      8 SENSITIVEThis is a modified FDA-approved test that has been validated and its performance characteristics determined by the reporting laboratory.  This laboratory is certified under the Clinical Laboratory Improvement Amendments CLIA as qualified to perform high complexity clinical laboratory testing.    * 30,000 COLONIES/mL PSEUDOMONAS AERUGINOSA  C Difficile Quick Screen w PCR reflex     Status: None   Collection Time: 10/18/24 12:47 PM   Specimen: STOOL  Result Value Ref Range Status   C Diff antigen NEGATIVE NEGATIVE Final   C Diff toxin NEGATIVE NEGATIVE Final   C Diff interpretation No C. difficile detected.  Final    Comment: Performed at [facility], [address], [place], [place] [address]  Gastrointestinal Panel by PCR , Stool     Status: Abnormal   Collection Time: 10/18/24 12:47 PM   Specimen: STOOL  Result Value Ref Range Status   Campylobacter species NOT DETECTED NOT DETECTED Final   Plesimonas shigelloides NOT DETECTED NOT DETECTED Final   Salmonella species NOT DETECTED NOT DETECTED Final   Yersinia enterocolitica NOT DETECTED NOT DETECTED Final   Vibrio species NOT DETECTED NOT DETECTED Final   Vibrio cholerae NOT DETECTED NOT DETECTED Final   Enteroaggregative E coli (EAEC) NOT DETECTED NOT DETECTED Final   Enteropathogenic E coli (EPEC) NOT DETECTED NOT DETECTED Final   Enterotoxigenic E coli (ETEC) DETECTED (A) NOT DETECTED Final    Comment: RESULT CALLED TO, READ BACK BY AND VERIFIED WITH: HILLARY MASHBURN 10/20/24 1219 MU    Shiga like toxin producing E coli (STEC) NOT  DETECTED NOT DETECTED Final   Shigella/Enteroinvasive E coli (EIEC) NOT DETECTED NOT DETECTED Final   Cryptosporidium NOT DETECTED NOT DETECTED Final   Cyclospora cayetanensis NOT DETECTED NOT DETECTED Final   Entamoeba histolytica NOT DETECTED NOT DETECTED Final   Giardia lamblia NOT DETECTED NOT DETECTED Final   Adenovirus F40/41 NOT DETECTED NOT DETECTED Final   Astrovirus NOT DETECTED NOT DETECTED Final   Norovirus GI/GII NOT DETECTED NOT DETECTED Final   Rotavirus A NOT DETECTED NOT DETECTED Final   Sapovirus (I, II, IV, and V) NOT DETECTED NOT DETECTED Final    Comment: Performed at [facility], [address] [place], [place] [address]         Radiology Studies: DG CHEST PORT 1 VIEW Result Date: 10/24/2024 CLINICAL DATA:  Cough, shortness of breath EXAM: PORTABLE CHEST 1 VIEW COMPARISON:  October 17, 2024 FINDINGS: The heart size and mediastinal contours are within normal limits. Elevated right hemidiaphragm is  noted. Lungs are clear. The visualized skeletal structures are unremarkable. IMPRESSION: No active disease. Electronically Signed   By: Lynwood Landy Raddle M.D.   On: 10/24/2024 10:43         Scheduled Meds:  aspirin  EC  325 mg Oral Daily   budesonide -glycopyrrolate -formoterol   2 puff Inhalation BID   buPROPion   150 mg Oral Daily   calcium -vitamin D   1 tablet Oral Daily   cholecalciferol   1,000 Units Oral Daily   enoxaparin  (LOVENOX ) injection  40 mg Subcutaneous Q24H   escitalopram   20 mg Oral Daily   famotidine   40 mg Oral Daily   fluticasone   1 spray Each Nare Daily   gabapentin   100 mg Oral BID   gabapentin   300 mg Oral QHS   ipratropium-albuterol   3 mL Nebulization BID   levothyroxine   50 mcg Oral QAC breakfast   LORazepam   1 mg Oral QHS   melatonin  5 mg Oral QHS   mirabegron  ER  25 mg Oral Daily   montelukast   10 mg Oral QHS   multivitamin  1 tablet Oral Daily   nitrofurantoin  (macrocrystal-monohydrate)  100 mg Oral QHS   vancomycin   125 mg  Oral BID   verapamil   360 mg Oral Daily   Continuous Infusions:     LOS: 7 days    Time spent: 40 minutes    Toribio Hummer, MD [facility]   To contact the attending provider between 7A-7P or the covering provider during after hours 7P-7A, please log into the web site [email or web address] and access using universal [place] password for that web site. If you do not have the password, please call the hospital operator.  10/24/2024, 1:05 PM    "

## 2024-10-25 ENCOUNTER — Inpatient Hospital Stay (HOSPITAL_COMMUNITY)

## 2024-10-25 DIAGNOSIS — I5033 Acute on chronic diastolic (congestive) heart failure: Secondary | ICD-10-CM | POA: Diagnosis not present

## 2024-10-25 DIAGNOSIS — Z1152 Encounter for screening for COVID-19: Secondary | ICD-10-CM | POA: Diagnosis not present

## 2024-10-25 DIAGNOSIS — I69351 Hemiplegia and hemiparesis following cerebral infarction affecting right dominant side: Secondary | ICD-10-CM | POA: Diagnosis not present

## 2024-10-25 DIAGNOSIS — N179 Acute kidney failure, unspecified: Secondary | ICD-10-CM | POA: Diagnosis not present

## 2024-10-25 DIAGNOSIS — I110 Hypertensive heart disease with heart failure: Secondary | ICD-10-CM | POA: Diagnosis not present

## 2024-10-25 DIAGNOSIS — F319 Bipolar disorder, unspecified: Secondary | ICD-10-CM | POA: Diagnosis not present

## 2024-10-25 DIAGNOSIS — K529 Noninfective gastroenteritis and colitis, unspecified: Secondary | ICD-10-CM | POA: Diagnosis not present

## 2024-10-25 DIAGNOSIS — B965 Pseudomonas (aeruginosa) (mallei) (pseudomallei) as the cause of diseases classified elsewhere: Secondary | ICD-10-CM | POA: Diagnosis not present

## 2024-10-25 DIAGNOSIS — N390 Urinary tract infection, site not specified: Secondary | ICD-10-CM | POA: Diagnosis not present

## 2024-10-25 DIAGNOSIS — G4733 Obstructive sleep apnea (adult) (pediatric): Secondary | ICD-10-CM | POA: Diagnosis not present

## 2024-10-25 DIAGNOSIS — T84213A Breakdown (mechanical) of internal fixation device of bones of foot and toes, initial encounter: Secondary | ICD-10-CM | POA: Diagnosis not present

## 2024-10-25 DIAGNOSIS — I2510 Atherosclerotic heart disease of native coronary artery without angina pectoris: Secondary | ICD-10-CM | POA: Diagnosis not present

## 2024-10-25 DIAGNOSIS — R0902 Hypoxemia: Secondary | ICD-10-CM

## 2024-10-25 DIAGNOSIS — A041 Enterotoxigenic Escherichia coli infection: Secondary | ICD-10-CM | POA: Diagnosis not present

## 2024-10-25 DIAGNOSIS — A419 Sepsis, unspecified organism: Secondary | ICD-10-CM | POA: Diagnosis not present

## 2024-10-25 DIAGNOSIS — E039 Hypothyroidism, unspecified: Secondary | ICD-10-CM | POA: Diagnosis not present

## 2024-10-25 LAB — BASIC METABOLIC PANEL WITH GFR
Anion gap: 7 (ref 5–15)
BUN: 11 mg/dL (ref 8–23)
CO2: 38 mmol/L — ABNORMAL HIGH (ref 22–32)
Calcium: 9.9 mg/dL (ref 8.9–10.3)
Chloride: 94 mmol/L — ABNORMAL LOW (ref 98–111)
Creatinine, Ser: 0.69 mg/dL (ref 0.44–1.00)
GFR, Estimated: >60 mL/min
Glucose, Bld: 92 mg/dL (ref 70–99)
Potassium: 4.1 mmol/L (ref 3.5–5.1)
Sodium: 139 mmol/L (ref 135–145)

## 2024-10-25 LAB — ECHOCARDIOGRAM COMPLETE
AR max vel: 2.37 cm2
AV Area VTI: 2.32 cm2
AV Area mean vel: 2.04 cm2
AV Mean grad: 4 mmHg
AV Peak grad: 5.9 mmHg
Ao pk vel: 1.21 m/s
Area-P 1/2: 3.28 cm2
Calc EF: 57 %
Height: 62 in
S' Lateral: 3.5 cm
Single Plane A2C EF: 56.7 %
Single Plane A4C EF: 58.8 %
Weight: 3121.71 [oz_av]

## 2024-10-25 LAB — MAGNESIUM: Magnesium: 1.8 mg/dL (ref 1.7–2.4)

## 2024-10-25 MED ORDER — LORATADINE 10 MG PO TABS
10.0000 mg | ORAL_TABLET | Freq: Every day | ORAL | Status: DC | PRN
  Administered 2024-10-25: 10 mg via ORAL
  Filled 2024-10-25: qty 1

## 2024-10-25 MED ORDER — FLUTICASONE PROPIONATE 50 MCG/ACT NA SUSP
2.0000 | Freq: Every day | NASAL | Status: DC
  Administered 2024-10-26: 2 via NASAL
  Filled 2024-10-25: qty 16

## 2024-10-25 MED ORDER — LORATADINE 10 MG PO TABS
10.0000 mg | ORAL_TABLET | Freq: Every day | ORAL | Status: DC
  Administered 2024-10-26: 10 mg via ORAL
  Filled 2024-10-25: qty 1

## 2024-10-25 MED ORDER — ORAL CARE MOUTH RINSE: 15.0000 mL | OROMUCOSAL | Status: DC | PRN

## 2024-10-25 MED ORDER — FUROSEMIDE 10 MG/ML IJ SOLN
40.0000 mg | Freq: Two times a day (BID) | INTRAMUSCULAR | Status: DC
  Administered 2024-10-25 (×2): 40 mg via INTRAVENOUS
  Filled 2024-10-25 (×2): qty 4

## 2024-10-25 MED ORDER — POTASSIUM CHLORIDE CRYS ER 20 MEQ PO TBCR
20.0000 meq | EXTENDED_RELEASE_TABLET | Freq: Once | ORAL | Status: AC
  Administered 2024-10-25: 20 meq via ORAL
  Filled 2024-10-25: qty 1

## 2024-10-25 NOTE — TOC Progression Note (Addendum)
 Transition of Care [facility]) - Progression Note    Patient Details  Name: Courtney [name] MRN: [id number] Date of Birth: Jun 18, 1946  [facility]) CM/SW Contact  Tawni CHRISTELLA Eva, LCSW Phone Number: 10/25/2024, 12:45 PM  Clinical Narrative:     CSW has started pt's insurance auth, auth pending. ICM to follow.    ADDEN 2:00pm   Pt's shara was approved for [facility] ID# [id number]; 10/25/2024-10/27/2024 CSW spoke with Brittany pt can admit tomorrow. ICM to follow.                  Expected Discharge Plan and Services In-house Referral: Clinical Social Work     Living arrangements for the past 2 months: Independent Living Facility                                       Social Drivers of Health (SDOH) Interventions SDOH Screenings   Food Insecurity: No Food Insecurity (10/19/2024)  Housing: Low Risk (10/19/2024)  Transportation Needs: No Transportation Needs (10/19/2024)  Utilities: Not At Risk (10/19/2024)  Social Connections: Moderately Integrated (10/19/2024)  Tobacco Use: Low Risk (10/17/2024)    Readmission Risk Interventions    10/21/2024   12:32 PM 04/13/2024    3:55 PM  Readmission Risk Prevention Plan  Transportation Screening Complete Complete  PCP or Specialist Appt within 5-7 Days  Complete  Home Care Screening  Complete  Medication Review (RN CM)  Complete  HRI or Home Care Consult Complete   Social Work Consult for Recovery Care Planning/Counseling Complete   Palliative Care Screening Not Applicable   Medication Review Oceanographer) Complete

## 2024-10-25 NOTE — Progress Notes (Signed)
 " PROGRESS NOTE    Courtney [name]  FMW:[id number] DOB: Jul 12, 1946 DOA: 10/17/2024 PCP: Ransom Other, MD   Chief Complaint  Patient presents with   Fall   Near Syncope    Brief Narrative:  SIEARRA [name] is a 79 y.o. female with medical history significant of anemia, hyperlipidemia, aortic atherosclerosis, aortic calcification, osteoarthritis of the wrist, asthma, bipolar disorder, depression, hiatal hernia, nephrolithiasis, hypertension, hypothyroidism, mitral valve prolapse, OSA on CPAP, pulmonary embolism, history of CVA with residual right-sided hemiparesis, thrombophlebitis, vertigo, multiple episodes of C. difficile colitis, diverticulosis, diverticulitis who started having diarrhea multiple episodes yesterday and is coming to the emergency department after having a fall at her ALF. ... See H&P for full HPI on admission & ED course.   ED course was most notable for significant leukocytosis WBC 24, negative viral PCRs, lactic acid 3.1.  CT of abdomen and pelvis showed acute colitis affecting the transverse colon and splenic flexure.  UA was also concerning for infection, urine culture has been added on.   Patient was admitted to the hospital on afternoon of 10/17/2024 for further evaluation and management of sepsis with acute on chronic colitis, AKI, hypokalemia as outlined in detail below.   Assessment & Plan:   Principal Problem:   Sepsis due to undetermined organism [facility]) Active Problems:   Hypertension   Dyslipidemia   OSA (obstructive sleep apnea)   CAD (coronary artery disease)   AKI (acute kidney injury)   Hypokalemia   Acute on chronic colitis   Intestinal infection due to enterotoxigenic E. coli  #1 sepsis due to undetermined organism in the setting of acute on chronic colitis/UTI/enterotoxigenic E. coli -Initial antibiotics started on admission for IV Rocephin , IV Flagyl  empiric oral vancomycin  apparently as recommended by her primary GI for prevention of  recurrent C. difficile. - Urine cultures did grow 30,000 colonies of Pseudomonas. - IV Rocephin  changed to cefepime  which was subsequently discontinued.. - Flagyl  discontinued. - Diet advanced to soft diet which patient is tolerating. - IV antiemetics as needed - Analgesics as needed. - Stool studies positive for enterotoxigenic E. coli. - IV cefepime  initially was changed to ciprofloxacin  and Cipro  subsequently discontinued as CT of the right foot with thickening of the distal Achilles tendon reflecting tendinosis with calcification within the distal Achilles tendon may be related to prior trauma. -Patient seen in consultation by ID who recommended discontinuation of systemic antibiotics, continuation of twice daily p.o. Vanco 125 mg x 7 more days until 10/28/2024, continuation of standard enteric isolation precautions. -ID recommended stopping Nexium if patient does not have PUD or Barrett's esophagus as his high risk for C. difficile. -PPI discontinued and patient placed on Pepcid . -Supportive care. - Follow.  2.  AKI-resolved -Likely secondary to a prerenal azotemia. - Resolved with hydration.  - Monitor with diuresis.  3.  Right foot pain -Patient noted to have suffered a fall prior to admission and presented with persistent right foot pain not previously imaged. - Patient with requisite tenderness over the dorsal midfoot. - X-rays ordered showing prior surgical changes with 1 screw confirmed as broken and a second screw suspected to be broken. - CT of the right foot ordered per orthopedics showed postsurgical changes at the base of the first metatarsal with 2 fractured screws, 1 of which demonstrates up to 5 mm of displacement.  No appreciable acute fracture involving the visualized bones.  No dislocation.  Hallux valgus with mild to moderate degenerative changes of the first MTP joint.  Moderate  degenerative changes at the first metatarsal head and hallux sesamoid articulation.  Mild to  moderate OA throughout the hindfoot and midfoot.  Well-corticated ossicle at the dorsal aspect of the Lisfranc joint.  Thickening of the distal Achilles tendon may reflect tendinosis..  Calcification within the distal Achilles tendon may relate to prior trauma. - Per orthopedic note x-ray and CT scans reviewed and no acute fracture noted, 2 broken screws seen in the first metatarsal, unclear as to timing of hardware failure.   - Orthopedics recommended WBAT RLE in postop shoe with walker.  PT/OT. - Outpatient follow-up with orthopedics.   4.  Hypokalemia - Likely secondary to GI losses and diuresis..   - Potassium at 4.1.   - Magnesium  at 1.8.   - Repeat labs in the AM.    5.  QTc prolongation -Repeat EKG with resolution of QTc prolongation.  6.  Hypertension - Verapamil .   7.  Hyperlipidemia - Statin on hold and will resume 1 week postdischarge.   8. OSA - CPAP nightly.   9.  CAD - Aspirin .   - Statin on hold and will resume 1 week postdischarge.   10.  History of asthma per patient -Patient with some expiratory wheezing noted on exam. - Continue Breztri . - Continue scheduled DuoNebs twice daily. - Status post IV Solu-Medrol  40 mg x 1. -Patient received IV Lasix  with good urine output. -See #11.  11.  Hypoxia/acute diastolic CHF exacerbation -Likely secondary to volume overload as patient received fluid resuscitation on presentation. -Patient not on oxygen prior to admission. - Patient with bibasilar crackles noted on exam. - Chest x-ray done was unremarkable.   - Patient given a dose of Lasix  40 mg IV x 1 and Lasix  20 mg IV x 1 on 10/24/2024 with urine output of 2.350 L over the past 24 hours.   - Check a 2D echo.  - place on Lasix  40 mg IV every 12 hours.  - Patient noted to be on Lasix  40 mg daily which she was taking on a as needed basis at home for lower extremity edema per patient and husband however per last cardiology note they were recommending patient be on Lasix   daily.  -Check ambulatory sats tomorrow.    DVT prophylaxis: Lovenox  Code Status: Full Family Communication: Updated patient and husband at bedside.  Disposition: SNF placement when medically stable.   Status is: Inpatient Remains inpatient appropriate because: Severity of illness   Consultants:  Orthopedics ID: Dr.Vu 10/21/2024  Procedures:  CT head 10/17/2024 CT C-spine 10/17/2024 CT abdomen and pelvis 10/17/2024 CT right foot 10/20/2024 Plan films of the right foot 10/19/2024 Plain films of the left hand 10/17/2024 Plain films of the right tib-fib 10/17/2024 Chest x-ray 10/17/2024 2D echo pending  Antimicrobials:  Anti-infectives (From admission, onward)    Start     Dose/Rate Route Frequency Ordered Stop   10/23/24 2200  nitrofurantoin  (MACRODANTIN ) capsule 100 mg  Status:  Discontinued        100 mg Oral Daily at bedtime 10/23/24 1819 10/23/24 1834   10/23/24 2200  nitrofurantoin  (macrocrystal-monohydrate) (MACROBID ) capsule 100 mg        100 mg Oral Daily at bedtime 10/23/24 1834     10/21/24 1415  vancomycin  (VANCOCIN ) capsule 125 mg        125 mg Oral 2 times daily 10/21/24 1318 10/28/24 0959   10/20/24 2000  ciprofloxacin  (CIPRO ) tablet 500 mg  Status:  Discontinued        500  mg Oral 2 times daily 10/20/24 1635 10/20/24 2050   10/19/24 1130  ceFEPIme  (MAXIPIME ) 2 g in sodium chloride  0.9 % 100 mL IVPB  Status:  Discontinued        2 g 200 mL/hr over 30 Minutes Intravenous Every 8 hours 10/19/24 1031 10/20/24 1634   10/18/24 1000  cefTRIAXone  (ROCEPHIN ) 2 g in sodium chloride  0.9 % 100 mL IVPB  Status:  Discontinued        2 g 200 mL/hr over 30 Minutes Intravenous Every 24 hours 10/17/24 1330 10/19/24 0957   10/17/24 2200  metroNIDAZOLE  (FLAGYL ) IVPB 500 mg  Status:  Discontinued        500 mg 100 mL/hr over 60 Minutes Intravenous Every 12 hours 10/17/24 1330 10/21/24 0932   10/17/24 1800  vancomycin  (VANCOCIN ) capsule 125 mg  Status:  Discontinued        125  mg Oral 4 times daily 10/17/24 1542 10/21/24 0932   10/17/24 1015  cefTRIAXone  (ROCEPHIN ) 2 g in sodium chloride  0.9 % 100 mL IVPB       Placed in And Linked Group   2 g 200 mL/hr over 30 Minutes Intravenous  Once 10/17/24 1009 10/17/24 1153   10/17/24 1015  metroNIDAZOLE  (FLAGYL ) IVPB 500 mg       Placed in And Linked Group   500 mg 100 mL/hr over 60 Minutes Intravenous  Once 10/17/24 1009 10/17/24 1117   10/17/24 1015  vancomycin  (VANCOCIN ) capsule 125 mg        125 mg Oral  Once 10/17/24 1009 10/17/24 1455         Subjective: Patient with complaints of shortness of breath.  Denies any chest pain.  No abdominal pain.  States had good urine output with diuresis.  Husband at bedside stating patient was supposed to get a 2D echo later on this month per cardiology which she was seen for lower extremity swelling and concern for skipped beat.  Objective: Vitals:   10/24/24 1809 10/24/24 1810 10/24/24 2037 10/25/24 0451  BP:   138/79 (!) 148/73  Pulse:   64 63  Resp:      Temp:   97.7 F (36.5 C) 98.6 F (37 C)  TempSrc:   Oral Oral  SpO2: (!) 87% 93% 96% 96%  Weight:      Height:        Intake/Output Summary (Last 24 hours) at 10/25/2024 1202 Last data filed at 10/25/2024 0511 Gross per 24 hour  Intake 480 ml  Output 2350 ml  Net -1870 ml   Filed Weights   10/17/24 0736  Weight: 88.5 kg    Examination:  General exam: NAD. Respiratory system: Bibasilar crakles, some scattered crackles.  No wheezing, no rhonchi, fair air movement.  Speaking in full sentences.  No use of accessory muscles of respiration.  Cardiovascular system: Regular rate rhythm no murmurs rubs or gallops.  No JVD.  No lower extremity edema.  Gastrointestinal system: Abdomen is soft, nontender, nondistended, positive bowel sounds.  No rebound.  No guarding.  Central nervous system: Alert and oriented. No focal neurological deficits. Extremities: Right foot significantly less tender to palpation.   Postop shoe in place.  No edema.  Symmetric 5 x 5 power. Skin: No rashes, lesions or ulcers Psychiatry: Judgement and insight appear normal. Mood & affect appropriate.     Data Reviewed: I have personally reviewed following labs and imaging studies  CBC: Recent Labs  Lab 10/19/24 0705 10/20/24 0431 10/21/24 0436 10/22/24 0351  WBC 13.0* 10.4 9.6 7.9  HGB 11.2* 10.2* 10.0* 11.9*  HCT 35.7* 32.6* 31.3* 37.2  MCV 96.2 95.0 93.2 92.8  PLT 157 163 147* 239    Basic Metabolic Panel: Recent Labs  Lab 10/20/24 0431 10/21/24 0436 10/22/24 0351 10/23/24 0422 10/25/24 0400  NA 138 137 137 140 139  K 3.4* 3.6 3.8 3.5 4.1  CL 103 100 97* 99 94*  CO2 29 29 30  35* 38*  GLUCOSE 93 87 145* 91 92  BUN 7* 6* <5* 14 11  CREATININE 0.60 0.58 0.51 0.64 0.69  CALCIUM  8.9 8.9 9.5 9.3 9.9  MG 1.8 2.0 1.9 1.9 1.8    GFR: Estimated Creatinine Clearance: 61.9 mL/min (by C-G formula based on SCr of 0.69 mg/dL).  Liver Function Tests: No results for input(s): AST, ALT, ALKPHOS, BILITOT, PROT, ALBUMIN in the last 168 hours.   CBG: No results for input(s): GLUCAP in the last 168 hours.    Recent Results (from the past 240 hours)  Resp panel by RT-PCR (RSV, Flu A&B, Covid) Anterior Nasal Swab     Status: None   Collection Time: 10/17/24  8:04 AM   Specimen: Anterior Nasal Swab  Result Value Ref Range Status   SARS Coronavirus 2 by RT PCR NEGATIVE NEGATIVE Final    Comment: (NOTE) SARS-CoV-2 target nucleic acids are NOT DETECTED.  The SARS-CoV-2 RNA is generally detectable in upper respiratory specimens during the acute phase of infection. The lowest concentration of SARS-CoV-2 viral copies this assay can detect is 138 copies/mL. A negative result does not preclude SARS-Cov-2 infection and should not be used as the sole basis for treatment or other patient management decisions. A negative result may occur with  improper specimen collection/handling, submission of  specimen other than nasopharyngeal swab, presence of viral mutation(s) within the areas targeted by this assay, and inadequate number of viral copies(<138 copies/mL). A negative result must be combined with clinical observations, patient history, and epidemiological information. The expected result is Negative.  Fact Sheet for Patients:  bloggercourse.com  Fact Sheet for Healthcare Providers:  seriousbroker.it  This test is no t yet approved or cleared by the [place]  FDA and  has been authorized for detection and/or diagnosis of SARS-CoV-2 by FDA under an Emergency Use Authorization (EUA). This EUA will remain  in effect (meaning this test can be used) for the duration of the COVID-19 declaration under Section 564(b)(1) of the Act, 21 U.S.C.section 360bbb-3(b)(1), unless the authorization is terminated  or revoked sooner.       Influenza A by PCR NEGATIVE NEGATIVE Final   Influenza B by PCR NEGATIVE NEGATIVE Final    Comment: (NOTE) The Xpert Xpress SARS-CoV-2/FLU/RSV plus assay is intended as an aid in the diagnosis of influenza from Nasopharyngeal swab specimens and should not be used as a sole basis for treatment. Nasal washings and aspirates are unacceptable for Xpert Xpress SARS-CoV-2/FLU/RSV testing.  Fact Sheet for Patients: bloggercourse.com  Fact Sheet for Healthcare Providers: seriousbroker.it  This test is not yet approved or cleared by the [place]  FDA and has been authorized for detection and/or diagnosis of SARS-CoV-2 by FDA under an Emergency Use Authorization (EUA). This EUA will remain in effect (meaning this test can be used) for the duration of the COVID-19 declaration under Section 564(b)(1) of the Act, 21 U.S.C. section 360bbb-3(b)(1), unless the authorization is terminated or revoked.     Resp Syncytial Virus by PCR NEGATIVE NEGATIVE Final     Comment: (NOTE) Fact Sheet  for Patients: bloggercourse.com  Fact Sheet for Healthcare Providers: seriousbroker.it  This test is not yet approved or cleared by the [place]  FDA and has been authorized for detection and/or diagnosis of SARS-CoV-2 by FDA under an Emergency Use Authorization (EUA). This EUA will remain in effect (meaning this test can be used) for the duration of the COVID-19 declaration under Section 564(b)(1) of the Act, 21 U.S.C. section 360bbb-3(b)(1), unless the authorization is terminated or revoked.  Performed at [facility], [address], [place], [place] [address]   Blood Culture (routine x 2)     Status: None   Collection Time: 10/17/24  8:48 AM   Specimen: BLOOD  Result Value Ref Range Status   Specimen Description   Final    BLOOD BLOOD RIGHT FOREARM Performed at [facility], [address], [place], [place] [address]    Special Requests   Final    BOTTLES DRAWN AEROBIC AND ANAEROBIC Blood Culture results may not be optimal due to an inadequate volume of blood received in culture bottles Performed at [facility], [address], [place], [place] [address]    Culture   Final    NO GROWTH 5 DAYS Performed at [facility], [address] [place], [place] [address]    Report Status 10/22/2024 FINAL  Final  Blood Culture (routine x 2)     Status: None   Collection Time: 10/17/24  8:56 AM   Specimen: BLOOD  Result Value Ref Range Status   Specimen Description   Final    BLOOD RIGHT ANTECUBITAL Performed at [facility], [address] [place], [place] [address]    Special Requests   Final    BOTTLES DRAWN AEROBIC AND ANAEROBIC Blood Culture adequate volume Performed at [facility], [address] [place], [place] [address]    Culture   Final    NO GROWTH 5 DAYS Performed at [facility], [address] [place], [place] [address]    Report Status 10/22/2024 FINAL  Final  Urine Culture (for pregnant, neutropenic or urologic patients or patients with an indwelling urinary catheter)     Status: Abnormal   Collection Time: 10/17/24  9:25 AM   Specimen: Urine, Clean Catch  Result Value Ref Range Status   Specimen Description   Final    URINE, CLEAN CATCH Performed at [facility], [address], [place], [place] [address]    Special Requests   Final    NONE Performed at [facility], [address] [place], [place] [address]    Culture (A)  Final    30,000 COLONIES/mL PSEUDOMONAS AERUGINOSA 30,000 COLONIES/mL ENTEROCOCCUS FAECIUM    Report Status 10/21/2024 FINAL  Final   Organism ID, Bacteria PSEUDOMONAS AERUGINOSA (A)  Final   Organism ID, Bacteria ENTEROCOCCUS FAECIUM (A)  Final      Susceptibility   Enterococcus faecium - MIC*    AMPICILLIN  <=2 SENSITIVE Sensitive     NITROFURANTOIN  64 INTERMEDIATE Intermediate     VANCOMYCIN  <=0.5 SENSITIVE Sensitive     * 30,000 COLONIES/mL ENTEROCOCCUS FAECIUM   Pseudomonas aeruginosa - MIC*    MEROPENEM <=0.25 SENSITIVE Sensitive     CIPROFLOXACIN  0.5 SENSITIVE Sensitive     IMIPENEM 1 SENSITIVE Sensitive     PIP/TAZO Value in next row Sensitive      8 SENSITIVEThis is a modified FDA-approved test that has been validated and its  performance characteristics determined by the reporting laboratory.  This laboratory is certified under the Clinical Laboratory Improvement Amendments CLIA as qualified to perform high complexity clinical laboratory testing.    CEFTAZIDIME/AVIBACTAM Value in next row Sensitive      8 SENSITIVEThis is a modified FDA-approved test that has been validated and its performance characteristics determined by the reporting laboratory.  This laboratory is certified under the Clinical Laboratory Improvement Amendments CLIA as qualified to perform high complexity  clinical laboratory testing.    CEFTOLOZANE/TAZOBACTAM Value in next row Sensitive      8 SENSITIVEThis is a modified FDA-approved test that has been validated and its performance characteristics determined by the reporting laboratory.  This laboratory is certified under the Clinical Laboratory Improvement Amendments CLIA as qualified to perform high complexity clinical laboratory testing.    TOBRAMYCIN Value in next row Sensitive      8 SENSITIVEThis is a modified FDA-approved test that has been validated and its performance characteristics determined by the reporting laboratory.  This laboratory is certified under the Clinical Laboratory Improvement Amendments CLIA as qualified to perform high complexity clinical laboratory testing.    CEFTAZIDIME Value in next row Sensitive      8 SENSITIVEThis is a modified FDA-approved test that has been validated and its performance characteristics determined by the reporting laboratory.  This laboratory is certified under the Clinical Laboratory Improvement Amendments CLIA as qualified to perform high complexity clinical laboratory testing.    * 30,000 COLONIES/mL PSEUDOMONAS AERUGINOSA  C Difficile Quick Screen w PCR reflex     Status: None   Collection Time: 10/18/24 12:47 PM   Specimen: STOOL  Result Value Ref Range Status   C Diff antigen NEGATIVE NEGATIVE Final   C Diff toxin NEGATIVE NEGATIVE Final   C Diff interpretation No C. difficile detected.  Final    Comment: Performed at [facility], [address] [place], [place] [address]  Gastrointestinal Panel by PCR , Stool     Status: Abnormal   Collection Time: 10/18/24 12:47 PM   Specimen: STOOL  Result Value Ref Range Status   Campylobacter species NOT DETECTED NOT DETECTED Final   Plesimonas shigelloides NOT DETECTED NOT DETECTED Final   Salmonella species NOT DETECTED NOT DETECTED Final   Yersinia enterocolitica NOT DETECTED NOT DETECTED Final   Vibrio species NOT DETECTED  NOT DETECTED Final   Vibrio cholerae NOT DETECTED NOT DETECTED Final   Enteroaggregative E coli (EAEC) NOT DETECTED NOT DETECTED Final   Enteropathogenic E coli (EPEC) NOT DETECTED NOT DETECTED Final   Enterotoxigenic E coli (ETEC) DETECTED (A) NOT DETECTED Final    Comment: RESULT CALLED TO, READ BACK BY AND VERIFIED WITH: HILLARY MASHBURN 10/20/24 1219 MU    Shiga like toxin producing E coli (STEC) NOT DETECTED NOT DETECTED Final   Shigella/Enteroinvasive E coli (EIEC) NOT DETECTED NOT DETECTED Final   Cryptosporidium NOT DETECTED NOT DETECTED Final   Cyclospora cayetanensis NOT DETECTED NOT DETECTED Final   Entamoeba histolytica NOT DETECTED NOT DETECTED Final   Giardia lamblia NOT DETECTED NOT DETECTED Final   Adenovirus F40/41 NOT DETECTED NOT DETECTED Final   Astrovirus NOT DETECTED NOT DETECTED Final   Norovirus GI/GII NOT DETECTED NOT DETECTED Final   Rotavirus A NOT DETECTED NOT DETECTED Final   Sapovirus (I, II, IV, and V) NOT DETECTED NOT DETECTED Final    Comment: Performed at [facility], [address] [place], [place] [address]  Radiology Studies: DG CHEST PORT 1 VIEW Result Date: 10/24/2024 CLINICAL DATA:  Cough, shortness of breath EXAM: PORTABLE CHEST 1 VIEW COMPARISON:  October 17, 2024 FINDINGS: The heart size and mediastinal contours are within normal limits. Elevated right hemidiaphragm is noted. Lungs are clear. The visualized skeletal structures are unremarkable. IMPRESSION: No active disease. Electronically Signed   By: Lynwood Landy Raddle M.D.   On: 10/24/2024 10:43         Scheduled Meds:  aspirin  EC  325 mg Oral Daily   budesonide -glycopyrrolate -formoterol   2 puff Inhalation BID   buPROPion   150 mg Oral Daily   calcium -vitamin D   1 tablet Oral Daily   cholecalciferol   1,000 Units Oral Daily   enoxaparin  (LOVENOX ) injection  40 mg Subcutaneous Q24H   escitalopram   20 mg Oral Daily   famotidine   40 mg Oral Daily   [START ON  10/26/2024] fluticasone   2 spray Each Nare Daily   furosemide   40 mg Intravenous Q12H   gabapentin   100 mg Oral BID   gabapentin   300 mg Oral QHS   ipratropium-albuterol   3 mL Nebulization BID   levothyroxine   50 mcg Oral QAC breakfast   loratadine   10 mg Oral Daily   LORazepam   1 mg Oral QHS   melatonin  5 mg Oral QHS   mirabegron  ER  25 mg Oral Daily   montelukast   10 mg Oral QHS   multivitamin  1 tablet Oral Daily   nitrofurantoin  (macrocrystal-monohydrate)  100 mg Oral QHS   vancomycin   125 mg Oral BID   verapamil   360 mg Oral Daily   Continuous Infusions:     LOS: 8 days    Time spent: 40 minutes    Toribio Hummer, MD [facility]   To contact the attending provider between 7A-7P or the covering provider during after hours 7P-7A, please log into the web site [email or web address] and access using universal [place] password for that web site. If you do not have the password, please call the hospital operator.  10/25/2024, 12:02 PM    "

## 2024-10-25 NOTE — Progress Notes (Signed)
 Physical Therapy Treatment Patient Details Name: Courtney [name] MRN: [id number] DOB: [birth year] Today's Date: 10/25/2024   History of Present Illness 79 yo F adm 10/17/24 with fall at home and near syncope with N/V/D and concern for sepsis with hypokalemia and AKI with UTI. Pt with R foot pain, no acute fx, orthopedics recommending WBAT RLE in postop shoe with walker. PMH: recent bladder stimulator implant on 6/20, CVA, Asthma, HTN, HLD, right side weakness, depression/anxiety, OSA, hiatal hernia, MVP, OSA on CPAP, CVA with R hemiparesis, asthma and anemia.    PT Comments  Pt in bed, HOB elevated, reports has been in bed for a week and fearful of mobilizing, also reports new head cold. Pt needing min A to mobilize to bedside, using bedrail and elevated HOB, therapist assisting to upright trunk and scoot out to place feet on floor. Pt powers to stand with mod A+2 from elevated bed, with static standing, pt fatigues and requests to return to sitting. Pt able to power to stand again with mod A+2 from elevated bed then take steps to recliner at bedside, mod-max A+2 to complete with BLE buckling, cues for upright posture and BLE engagement to minimize buckling. Once in recliner, pt tolerates seated marching reporting easy then able to complete LAQ reporting moderate, noted to have dyspnea and coughing. Pt on RA with SpO2 87% upon arrival, placed 2L O2 and pt with SpO2 96-97% throughout treatment. Continue to recommend inpatient follow up therapy, <3 hours/day.    If plan is discharge home, recommend the following: Two people to help with walking and/or transfers;Two people to help with bathing/dressing/bathroom;Assistance with cooking/housework;Assist for transportation;Help with stairs or ramp for entrance   Can travel by private vehicle     No  Equipment Recommendations  None recommended by PT    Recommendations for Other Services       Precautions / Restrictions Precautions Precautions:  Fall Recall of Precautions/Restrictions: Intact Required Braces or Orthoses: Other Brace Other Brace: post op shoe on R Restrictions Weight Bearing Restrictions Per Provider Order: Yes RLE Weight Bearing Per Provider Order: Weight bearing as tolerated     Mobility  Bed Mobility Overal bed mobility: Needs Assistance Bed Mobility: Supine to Sit     Supine to sit: Min assist, Used rails, HOB elevated     General bed mobility comments: min A to utilize bedrail and pull self up into sitting and scoot out to bedside, pulling with BUE to assist in uprighting trunk, assist to scoot hips forward to place feet on floor    Transfers Overall transfer level: Needs assistance Equipment used: Rolling walker (2 wheels) Transfers: Sit to/from Stand, Bed to chair/wheelchair/BSC Sit to Stand: Mod assist, +2 physical assistance, From elevated surface   Step pivot transfers: Mod assist, Max assist, +2 physical assistance       General transfer comment: mod A+2 to power up from elevated surface for 2 reps, mod to max A+2 for step pivot to recliner at bedside, pt with BLE buckling requiring assist and cues for upright posture and BLE muscle activation    Ambulation/Gait                   Stairs             Wheelchair Mobility     Tilt Bed    Modified Rankin (Stroke Patients Only)       Balance Overall balance assessment: Needs assistance Sitting-balance support: Feet supported, Bilateral upper extremity supported Sitting balance-Leahy Scale:  Poor Sitting balance - Comments: propped on BUE   Standing balance support: During functional activity, Bilateral upper extremity supported, Reliant on assistive device for balance Standing balance-Leahy Scale: Poor                              Communication Communication Communication: No apparent difficulties  Cognition Arousal: Alert Behavior During Therapy: WFL for tasks assessed/performed   PT - Cognitive  impairments: No apparent impairments                         Following commands: Intact      Cueing Cueing Techniques: Verbal cues  Exercises General Exercises - Lower Extremity Long Arc Quad: AROM, Both, 10 reps, Seated Hip Flexion/Marching: AROM, Both, 10 reps, Seated    General Comments General comments (skin integrity, edema, etc.): Pt on RA upon arrival with SpO2 87%, placed 2L and SpO2 96-97% with all mobility - notified RN      Pertinent Vitals/Pain Pain Assessment Pain Assessment: No/denies pain    Home Living                          Prior Function            PT Goals (current goals can now be found in the care plan section) Acute Rehab PT Goals Patient Stated Goal: get stronger prior to going home PT Goal Formulation: With patient/family Time For Goal Achievement: 11/04/24 Potential to Achieve Goals: Good Additional Goals Additional Goal #1: Patient will propel wheelchair 100' with S Progress towards PT goals: Progressing toward goals    Frequency    Min 2X/week      PT Plan      Co-evaluation              AM-PAC PT 6 Clicks Mobility   Outcome Measure  Help needed turning from your back to your side while in a flat bed without using bedrails?: A Lot Help needed moving from lying on your back to sitting on the side of a flat bed without using bedrails?: A Lot Help needed moving to and from a bed to a chair (including a wheelchair)?: Total Help needed standing up from a chair using your arms (e.g., wheelchair or bedside chair)?: A Lot Help needed to walk in hospital room?: Total Help needed climbing 3-5 steps with a railing? : Total 6 Click Score: 9    End of Session Equipment Utilized During Treatment: Gait belt;Oxygen Activity Tolerance: Patient tolerated treatment well;Patient limited by fatigue Patient left: in chair;with call bell/phone within reach;with chair alarm set Nurse Communication: Mobility status;Other  (comment) (SpO2) PT Visit Diagnosis: Other abnormalities of gait and mobility (R26.89);Muscle weakness (generalized) (M62.81);Pain Pain - Right/Left: Right Pain - part of body: Ankle and joints of foot     Time: 1010-1046 PT Time Calculation (min) (ACUTE ONLY): 36 min  Charges:    $Therapeutic Exercise: 8-22 mins $Therapeutic Activity: 8-22 mins PT General Charges $$ ACUTE PT VISIT: 1 Visit                     Tori Jhovany Weidinger PT, DPT 10/25/2024, 11:48 AM

## 2024-10-25 NOTE — Progress Notes (Signed)
 Occupational Therapy Treatment Patient Details Name: Courtney [name] MRN: [id number] DOB: Aug 08, 1946 Today's Date: 10/25/2024   History of present illness 79 yo F adm 10/17/24 with fall at home and near syncope with N/V/D and concern for sepsis with hypokalemia and AKI with UTI. Pt with R foot pain, no acute fx, orthopedics recommending WBAT RLE in postop shoe with walker. PMH: recent bladder stimulator implant on 6/20, CVA, Asthma, HTN, HLD, right side weakness, depression/anxiety, OSA, hiatal hernia, MVP, OSA on CPAP, CVA with R hemiparesis, asthma and anemia.   OT comments  Pt agreeable to participate in OT treatment session focusing on sit to stand transitions and standing tolerance in order to increase functional performance during self care tasks and transfers. Pt continues to demonstrate decreased core strength resulting in a right lateral lean requiring increased physical assistance to maintain sitting balance. Did improve with each standing trail while increasing her standing time for 3 trials! Patient will benefit from continued inpatient follow up therapy, <3 hours/day.       If plan is discharge home, recommend the following:  Two people to help with walking and/or transfers;A lot of help with bathing/dressing/bathroom;Help with stairs or ramp for entrance   Equipment Recommendations  None recommended by OT       Precautions / Restrictions Precautions Precautions: Fall Recall of Precautions/Restrictions: Intact Required Braces or Orthoses: Other Brace Other Brace: post op shoe on R Restrictions Weight Bearing Restrictions Per Provider Order: Yes RLE Weight Bearing Per Provider Order: Weight bearing as tolerated       Mobility Bed Mobility Overal bed mobility: Needs Assistance Bed Mobility: Supine to Sit, Sit to Supine     Supine to sit: Max assist, HOB elevated, Used rails Sit to supine: Max assist, +2 for physical assistance   General bed mobility comments: VC to  utilize bed rail and for sequencing through transitioning from supine to sitting EOB. Required increased assistance to scoot hips towards EOB using bed pad and to bring trunk off HOB.    Transfers Overall transfer level: Needs assistance Equipment used: Rolling walker (2 wheels) Transfers: Sit to/from Stand Sit to Stand: Min assist, +2 physical assistance, From elevated surface    General transfer comment: Pt completed 3 trials of sit<>stand transitions utilizing RW. VC for hand placement prior to sit to stand transition. Focused on standing tolerance for each trial. 1st trail: 10, 2nd trial: 22 3rd trial: 45. Pt required VC to straighten legs and push down into RW handles while standing. Unable to maintain tall standing posture without significant cueing.     Balance Overall balance assessment: Needs assistance Sitting-balance support: Feet supported, Bilateral upper extremity supported Sitting balance-Leahy Scale: Poor Sitting balance - Comments: Required external support to maintain sitting balance while sitting EOB Postural control: Right lateral lean Standing balance support: During functional activity, Bilateral upper extremity supported, Reliant on assistive device for balance Standing balance-Leahy Scale: Poor      ADL either performed or assessed with clinical judgement   ADL      Lower Body Dressing: Minimal assistance;Bed level;Sitting/lateral leans Lower Body Dressing Details (indicate cue type and reason): Pt donned left sock supine in bed using figure 4 position. Partially donned shoe. While seated EOB, pt donned right sock using figure 4 position. Assistance to donn post op shoe.                 Communication Communication Communication: No apparent difficulties   Cognition Arousal: Alert Behavior During Therapy: [facility] for  tasks assessed/performed Cognition: No apparent impairments      Following commands: Intact        Cueing   Cueing Techniques: Verbal  cues        General Comments Pt on RA with SpO2 remaining in low 90's.    Pertinent Vitals/ Pain       Pain Assessment Pain Assessment: Faces Faces Pain Scale: Hurts little more Pain Location: R foot Pain Descriptors / Indicators: Grimacing, Guarding Pain Intervention(s): Monitored during session, Limited activity within patient's tolerance         Frequency  Min 2X/week        Progress Toward Goals  OT Goals(current goals can now be found in the care plan section)  Progress towards OT goals: Progressing toward goals            AM-PAC OT 6 Clicks Daily Activity     Outcome Measure   Help from another person eating meals?: A Little Help from another person taking care of personal grooming?: A Little Help from another person toileting, which includes using toliet, bedpan, or urinal?: A Lot Help from another person bathing (including washing, rinsing, drying)?: A Lot Help from another person to put on and taking off regular upper body clothing?: A Lot Help from another person to put on and taking off regular lower body clothing?: A Lot 6 Click Score: 14    End of Session Equipment Utilized During Treatment: Gait belt;Rolling walker (2 wheels)  OT Visit Diagnosis: Muscle weakness (generalized) (M62.81);Unsteadiness on feet (R26.81);Other abnormalities of gait and mobility (R26.89);History of falling (Z91.81)   Activity Tolerance Patient tolerated treatment well   Patient Left in chair;with call bell/phone within reach;with family/visitor present;with bed alarm set           Time: 1538-1600 OT Time Calculation (min): 22 min  Charges: OT General Charges $OT Visit: 1 Visit OT Treatments $Therapeutic Activity: 8-22 mins  Leita Howell, OTR/L,CBIS  Supplemental OT - [facility] and [facility] Secure Chat Preferred    Lorrin Nawrot, Leita BIRCH 10/25/2024, 5:09 PM

## 2024-10-25 NOTE — Progress Notes (Signed)
" °   10/25/24 2158  BiPAP/CPAP/SIPAP  BiPAP/CPAP/SIPAP Pt Type Adult  BiPAP/CPAP/SIPAP DREAMSTATIOND  Mask Type Nasal mask  Dentures removed? Not applicable  Mask Size Medium  Respiratory Rate 13 breaths/min  Flow Rate 2 lpm  Patient Home Machine No  Patient Home Mask Yes  Patient Home Tubing No  Auto Titrate Yes  Minimum cmH2O 5 cmH2O  Maximum cmH2O 20 cmH2O  CPAP/SIPAP surface wiped down Yes  Device Plugged into RED Power Outlet Yes  BiPAP/CPAP /SiPAP Vitals  Resp 13  MEWS Score/Color  MEWS Score 1  MEWS Score Color Green    "

## 2024-10-26 DIAGNOSIS — A419 Sepsis, unspecified organism: Secondary | ICD-10-CM | POA: Diagnosis not present

## 2024-10-26 DIAGNOSIS — G4733 Obstructive sleep apnea (adult) (pediatric): Secondary | ICD-10-CM | POA: Diagnosis not present

## 2024-10-26 DIAGNOSIS — I2510 Atherosclerotic heart disease of native coronary artery without angina pectoris: Secondary | ICD-10-CM | POA: Diagnosis not present

## 2024-10-26 DIAGNOSIS — I10 Essential (primary) hypertension: Secondary | ICD-10-CM | POA: Diagnosis not present

## 2024-10-26 LAB — BASIC METABOLIC PANEL WITH GFR
Anion gap: 7 (ref 5–15)
BUN: 12 mg/dL (ref 8–23)
CO2: 40 mmol/L — ABNORMAL HIGH (ref 22–32)
Calcium: 9.9 mg/dL (ref 8.9–10.3)
Chloride: 93 mmol/L — ABNORMAL LOW (ref 98–111)
Creatinine, Ser: 0.78 mg/dL (ref 0.44–1.00)
GFR, Estimated: >60 mL/min
Glucose, Bld: 100 mg/dL — ABNORMAL HIGH (ref 70–99)
Potassium: 4 mmol/L (ref 3.5–5.1)
Sodium: 140 mmol/L (ref 135–145)

## 2024-10-26 LAB — MAGNESIUM: Magnesium: 1.8 mg/dL (ref 1.7–2.4)

## 2024-10-26 MED ORDER — FAMOTIDINE 40 MG PO TABS: 40.0000 mg | ORAL_TABLET | Freq: Every day | ORAL | Status: AC

## 2024-10-26 MED ORDER — ONDANSETRON HCL 4 MG PO TABS: 4.0000 mg | ORAL_TABLET | Freq: Four times a day (QID) | ORAL | 0 refills | Status: AC | PRN

## 2024-10-26 MED ORDER — LORAZEPAM 1 MG PO TABS: 1.0000 mg | ORAL_TABLET | Freq: Every day | ORAL | 0 refills | Status: AC

## 2024-10-26 MED ORDER — ACETAMINOPHEN 325 MG PO TABS: 650.0000 mg | ORAL_TABLET | Freq: Four times a day (QID) | ORAL | Status: AC | PRN

## 2024-10-26 MED ORDER — VANCOMYCIN HCL 125 MG PO CAPS: 125.0000 mg | ORAL_CAPSULE | Freq: Two times a day (BID) | ORAL | 0 refills | Status: AC

## 2024-10-26 MED ORDER — POTASSIUM CHLORIDE ER 10 MEQ PO CPCR: 20.0000 meq | ORAL_CAPSULE | Freq: Every day | ORAL | Status: AC

## 2024-10-26 MED ORDER — FUROSEMIDE 40 MG PO TABS: 40.0000 mg | ORAL_TABLET | Freq: Every day | ORAL | Status: AC

## 2024-10-26 NOTE — Discharge Summary (Signed)
 Physician Discharge Summary  Courtney [name] FMW:[id number] DOB: February 04, 1946 DOA: 10/17/2024  PCP: Ransom Other, MD  Admit date: 10/17/2024 Discharge date: 10/26/2024  Time spent: 60 minutes  Recommendations for Outpatient Follow-up:  Follow-up with MD at the SNF.  Patient will need a basic metabolic profile done in 1 week to follow-up on electrolytes and renal function. Follow-up with Dr. Ernie, orthopedics in 3 weeks. Follow-up with Dr.Patwardhan, cardiology in 2 weeks.   Discharge Diagnoses:  Principal Problem:   Sepsis due to undetermined organism [facility]) Active Problems:   Hypertension   Dyslipidemia   OSA (obstructive sleep apnea)   CAD (coronary artery disease)   AKI (acute kidney injury)   Hypokalemia   Acute on chronic colitis   Intestinal infection due to enterotoxigenic E. coli   Discharge Condition: Stable and improved.  Diet recommendation: Heart healthy  Filed Weights   10/17/24 0736 10/25/24 1400 10/26/24 0500  Weight: 88.5 kg 88.5 kg 88.1 kg    History of present illness:  HPI per Dr. Celinda Madeline FORBES Courtney [name] is a 79 y.o. female with medical history significant of anemia, hyperlipidemia, aortic atherosclerosis, aortic calcification, osteoarthritis of the wrist, asthma, bipolar disorder, depression, hiatal hernia, nephrolithiasis, hypertension, hypothyroidism, mitral valve prolapse, OSA on CPAP, pulmonary embolism, history of CVA with residual right-sided hemiparesis, thrombophlebitis, vertigo, multiple episodes of C. difficile colitis, diverticulosis, diverticulitis who started having diarrhea multiple episodes yesterday and is coming to the emergency department after having a fall at her ALF.  He denied fever, chills, rhinorrhea, sore throat, wheezing or hemoptysis.  No chest pain, palpitations, diaphoresis, PND, orthopnea or pitting edema of the lower extremities.  No abdominal pain, nausea, emesis, diarrhea, constipation, melena or hematochezia.  No flank pain,  dysuria, frequency or hematuria.  No polyuria, polydipsia, polyphagia or blurred vision.    Lab work: CBC showed a white count of 24.0, hemoglobin 12.9 g/dL and platelets 739.  Normal PT and INR.  Unremarkable lipase.  Negative coronavirus, influenza and RSV PCR test.  Lactic acid was 3.1 then 2.7 mmol/L.  CMP showed normal sodium, CO2 and anion gap.  A potassium 3.4 and chloride of 96 mmol/L, alkaline phosphatase 144 with the rest of the LFTs within normal limits.  Glucose 144, BUN 21, creatinine 1.41 and calcium  10.5 mg/dL.   Imaging: Portable 1 view chest radiograph showing low lung volumes.  No acute findings.  Right tibia and fibula negative.  Left ankle x-ray showing osteoarthritis but no acute findings.  CT head without contrast showing left periorbital soft tissue's injury.  No acute intracranial abnormality with stable appearance of chronic small vessel disease.  CT cervical spine without contrast was mildly motion degraded, but no acute traumatic injury with same.  There is chronic C3-C6  ACDF, with advanced adjacent segment degeneration.  CT abdomen/pelvis with contrast showing 11 cm and acute colitis, mostly affecting the transverse colon and splenic flexure.  No bowel obstruction, perforation or abscess.  Underlying highly redundant large bowel with extensive retained stool.  Little to no renal contrast excretion, raising the possibility of acute renal insufficiency.  No acute traumatic injury identified.   ED course: Initial vital signs were temperature 99 F, pulse 90, respiration 20, BP 125/73 mmHg O2 sat 96% on room air.  The patient received 1000 mL of normal saline bolus, 1000 mL of LR bolus, ondansetron  4 mg IVP x 1, KCl 40 mEq p.o. x 1, ceftriaxone  2 g IVPB x 1 dose and metronidazole  500 mg IVPB.  Hospital Course:  #  1 sepsis due to undetermined organism in the setting of acute on chronic colitis/UTI/enterotoxigenic E. coli -Initial antibiotics started on admission for IV Rocephin , IV  Flagyl  empiric oral vancomycin  apparently as recommended by her primary GI for prevention of recurrent C. difficile. - Urine cultures did grow 30,000 colonies of Pseudomonas. - IV Rocephin  changed to cefepime  which was subsequently discontinued.. - Flagyl  discontinued. - Diet advanced to soft diet which patient tolerated. - IV antiemetics as needed - Analgesics as needed. - Stool studies positive for enterotoxigenic E. coli. - IV cefepime  initially was changed to ciprofloxacin  and Cipro  subsequently discontinued as CT of the right foot with thickening of the distal Achilles tendon reflecting tendinosis with calcification within the distal Achilles tendon may be related to prior trauma. -Patient seen in consultation by ID who recommended discontinuation of systemic antibiotics, continuation of twice daily p.o. Vanco 125 mg x 7 more days until 10/28/2024, continuation of standard enteric isolation precautions. -ID recommended stopping Nexium if patient does not have PUD or Barrett's esophagus as his high risk for C. difficile. -PPI discontinued and patient placed on Pepcid  which patient will be discharged on.   - Patient improved clinically and will be discharged in stable and improved condition.    2.  AKI-resolved -Likely secondary to a prerenal azotemia. - Resolved with hydration.    3.  Right foot pain -Patient noted to have suffered a fall prior to admission and presented with persistent right foot pain not previously imaged. - Patient with requisite tenderness over the dorsal midfoot. - X-rays ordered showing prior surgical changes with 1 screw confirmed as broken and a second screw suspected to be broken. - CT of the right foot ordered per orthopedics showed postsurgical changes at the base of the first metatarsal with 2 fractured screws, 1 of which demonstrates up to 5 mm of displacement.  No appreciable acute fracture involving the visualized bones.  No dislocation.  Hallux valgus with  mild to moderate degenerative changes of the first MTP joint.  Moderate degenerative changes at the first metatarsal head and hallux sesamoid articulation.  Mild to moderate OA throughout the hindfoot and midfoot.  Well-corticated ossicle at the dorsal aspect of the Lisfranc joint.  Thickening of the distal Achilles tendon may reflect tendinosis..  Calcification within the distal Achilles tendon may relate to prior trauma. - Per orthopedic note x-ray and CT scans reviewed and no acute fracture noted, 2 broken screws seen in the first metatarsal, unclear as to timing of hardware failure.   - Orthopedics recommended WBAT RLE in postop shoe with walker.  PT/OT. - Outpatient follow-up with orthopedics.    4.  Hypokalemia - Likely secondary to GI losses and diuresis..   - Repleted during the hospitalization.   - Outpatient follow-up.    5.  QTc prolongation -Repeat EKG with resolution of QTc prolongation.   6.  Hypertension - Patient maintained on home regimen verapamil .    7.  Hyperlipidemia - Statin held during the hospitalization and will be resumed 1 week postdischarge.      8. OSA - CPAP nightly.    9.  CAD - Aspirin .   - Statin held during the hospitalization and will be resumed 1 week postdischarge.     10.  History of asthma per patient -Patient with some expiratory wheezing noted on exam. - Patient maintained on Breztri  as well as scheduled DuoNebs twice daily.   - Patient received a dose of IV Solu-Medrol .   - Patient also placed  on IV diuretics with good urine output.   - Patient with clinical improvement.  -See #11.   11.  Hypoxia/acute diastolic CHF exacerbation -Likely secondary to volume overload as patient received fluid resuscitation on presentation. -Patient not on oxygen prior to admission. - Patient with bibasilar crackles noted on exam. - Chest x-ray done was unremarkable.   - Patient given a dose of Lasix  40 mg IV x 1 and Lasix  20 mg IV x 1 on 10/24/2024 with  urine output of 2.350 L over the past 24 hours.   - 2D echo done with a EF of 60 to 65%, NWMA, grade 1 DD, trivial MVR.   - Patient placed on Lasix  40 mg IV every 12 hours with good urine output of 2.5 L over the past 24 hours and patient was -6.2 to 5 L during his hospitalization  - Patient improved clinically and will be transition to Lasix  40 mg daily.  -Patient's hypoxia had resolved by day of discharge.   - Outpatient follow-up with primary cardiologist in 2 weeks.    Procedures: CT head 10/17/2024 CT C-spine 10/17/2024 CT abdomen and pelvis 10/17/2024 CT right foot 10/20/2024 Plan films of the right foot 10/19/2024 Plain films of the left hand 10/17/2024 Plain films of the right tib-fib 10/17/2024 Chest x-ray 10/17/2024 2D echo 10/25/2024  Consultations: Orthopedics ID: Dr.[email or web address] 10/21/2024  Discharge Exam: Vitals:   10/26/24 0900 10/26/24 0955  BP:    Pulse:    Resp:    Temp:    SpO2: 95% 96%    General: NAD Cardiovascular: RRR no murmurs rubs or gallops.  No JVD.  No lower extremity pitting edema. Respiratory: Clear to auscultation bilaterally.  No wheezes, no crackles, no rhonchi.  Fair air movement.  Speaking full sentences.  Discharge Instructions   Discharge Instructions     Diet - low sodium heart healthy   Complete by: As directed    Increase activity slowly   Complete by: As directed       Allergies as of 10/26/2024       Reactions   Tetanus Toxoid-containing Vaccines Anaphylaxis   Tetanus-diphtheria Toxoids Td Anaphylaxis   Meloxicam Nausea And Vomiting   Phentermine Nausea And Vomiting   Sulfa Antibiotics Diarrhea, Nausea And Vomiting   Zithromax [azithromycin] Diarrhea, Nausea And Vomiting        Medication List     PAUSE taking these medications    atorvastatin  80 MG tablet Wait to take this until: November 02, 2024 Commonly known as: LIPITOR  Take 80 mg by mouth every evening.       STOP taking these medications    cefadroxil  500 MG  capsule Commonly known as: DURICEF   pantoprazole  40 MG tablet Commonly known as: PROTONIX    predniSONE  20 MG tablet Commonly known as: DELTASONE        TAKE these medications    acetaminophen  325 MG tablet Commonly known as: TYLENOL  Take 2 tablets (650 mg total) by mouth every 6 (six) hours as needed for mild pain (pain score 1-3) or fever (or Fever >/= 101).   albuterol  108 (90 Base) MCG/ACT inhaler Commonly known as: VENTOLIN  HFA Inhale 1 puff into the lungs every 6 (six) hours as needed for wheezing or shortness of breath.   aspirin  325 MG tablet Take 325 mg by mouth daily.   benzonatate  200 MG capsule Commonly known as: TESSALON  Take 200 mg by mouth 3 (three) times daily as needed for cough.   Biotin 5000 MCG Caps Take  5,000 mcg by mouth daily.   buPROPion  150 MG 12 hr tablet Commonly known as: WELLBUTRIN  SR Take 150 mg by mouth daily.   CALCIUM  600/VITAMIN D  PO Take 1 tablet by mouth daily.   carboxymethylcellulose 0.5 % Soln Commonly known as: REFRESH PLUS Place 1 drop into both eyes daily as needed (dry eyes).   cyanocobalamin  1000 MCG/ML injection Commonly known as: VITAMIN B12 Inject 1,000 mcg into the muscle every 30 (thirty) days.   diclofenac  Sodium 1 % Gel Commonly known as: VOLTAREN  Apply 2 g topically 4 (four) times daily as needed (pain).   docusate sodium  100 MG capsule Commonly known as: COLACE Take 100 mg by mouth 2 (two) times daily.   escitalopram  20 MG tablet Commonly known as: LEXAPRO  Take 20 mg by mouth daily.   estradiol 0.1 MG/GM vaginal cream Commonly known as: ESTRACE Place vaginally.   famotidine  40 MG tablet Commonly known as: PEPCID  Take 1 tablet (40 mg total) by mouth daily. Start taking on: October 27, 2024   fluticasone  50 MCG/ACT nasal spray Commonly known as: FLONASE  Place 1 spray into both nostrils at bedtime.   furosemide  40 MG tablet Commonly known as: LASIX  Take 1 tablet (40 mg total) by mouth  daily. What changed:  when to take this reasons to take this   gabapentin  300 MG capsule Commonly known as: NEURONTIN  Take 300 mg by mouth at bedtime. To be taken in addition to 100mg  capsule in the morning and 100mg  capsule in the afternoon for a total daily dose of 500mg    gabapentin  100 MG capsule Commonly known as: NEURONTIN  Take 100 mg by mouth 2 (two) times daily. Take 100mg  in the morning and 100mg  in the afternoon. To be taken in addition to 300mg  capsule nightly for a total daily dose of 500mg    Gemtesa  75 MG Tabs Generic drug: Vibegron  Take by mouth.   ipratropium 0.03 % nasal spray Commonly known as: ATROVENT Place 1 spray into both nostrils daily.   levothyroxine  50 MCG tablet Commonly known as: SYNTHROID  Take 50 mcg by mouth daily before breakfast.   LORazepam  1 MG tablet Commonly known as: ATIVAN  Take 1 tablet (1 mg total) by mouth at bedtime.   meclizine  25 MG tablet Commonly known as: ANTIVERT  Take 25 mg by mouth every 8 (eight) hours as needed for dizziness.   melatonin 5 MG Tabs Take 5 mg by mouth at bedtime.   montelukast  10 MG tablet Commonly known as: SINGULAIR  Take 10 mg by mouth at bedtime.   multivitamin-lutein  Caps capsule Take 1 capsule by mouth daily.   nitrofurantoin  100 MG capsule Commonly known as: MACRODANTIN  Take 100 mg by mouth daily.   ondansetron  4 MG tablet Commonly known as: ZOFRAN  Take 1 tablet (4 mg total) by mouth every 6 (six) hours as needed for nausea.   potassium chloride  10 MEQ CR capsule Commonly known as: MICRO-K  Take 2 capsules (20 mEq total) by mouth daily. What changed:  how much to take when to take this Another medication with the same name was removed. Continue taking this medication, and follow the directions you see here.   PRESCRIPTION MEDICATION CPAP- At bedtime   Trelegy Ellipta 100-62.5-25 MCG/ACT Aepb Generic drug: Fluticasone -Umeclidin-Vilant Inhale 1 puff into the lungs daily.   vancomycin   125 MG capsule Commonly known as: VANCOCIN  Take 1 capsule (125 mg total) by mouth 2 (two) times daily for 2 days.   verapamil  120 MG CR tablet Commonly known as: CALAN -SR Take 360 mg  by mouth daily.   Vitamin D3 25 MCG (1000 UT) Caps Take 1,000 Units by mouth daily.               Durable Medical Equipment  (From admission, onward)           Start     Ordered   10/21/24 0931  For home use only DME 4 wheeled rolling walker with seat  Once       Question:  Patient needs a walker to treat with the following condition  Answer:  Debility   10/21/24 0930           Allergies[1]  Contact information for follow-up providers     MD at SNF Follow up.          Ernie Cough, MD. Schedule an appointment as soon as possible for a visit in 3 week(s).   Specialty: Orthopedic Surgery Why: Follow-up in 3 to 4 weeks. Contact information: [address] [place]  [address] [phone]         Elmira Newman PARAS, MD. Schedule an appointment as soon as possible for a visit in 2 week(s).   Specialties: Cardiology, Radiology Contact information: [address] [place] [phone]              Contact information for after-discharge care     Destination     [facility] .   Service: Skilled Nursing Contact information: [address] [place]  [address] [phone]                      The results of significant diagnostics from this hospitalization (including imaging, microbiology, ancillary and laboratory) are listed below for reference.    Significant Diagnostic Studies: ECHOCARDIOGRAM COMPLETE Result Date: 10/25/2024    ECHOCARDIOGRAM REPORT   Patient Name:   ELLINORE MERCED Date of Exam: 10/25/2024 Medical Rec #:  [id number]       Height:       62.0 in Accession #:    [id number]      Weight:       195.0 lb Date of Birth:  Jan 26, 1946       BSA:          1.891 m Patient Age:    78 years         BP:           113/82 mmHg Patient Gender: F               HR:           75 bpm. Exam Location:  Inpatient Procedure: 2D Echo, Cardiac Doppler and Color Doppler (Both Spectral and Color            Flow Doppler were utilized during procedure). Indications:    CHF-Acute Diastolic I50.31  History:        Patient has prior history of Echocardiogram examinations, most                 recent 01/02/2022. Stroke; Risk Factors:Hypertension.  Sonographer:    Sydnee Wilson RDCS Referring Phys: 29 Dorlisa Savino V Chloe Flis IMPRESSIONS  1. Left ventricular ejection fraction, by estimation, is 60 to 65%. The left ventricle has normal function. The left ventricle has no regional wall motion abnormalities. Left ventricular diastolic parameters are consistent with Grade I diastolic dysfunction (impaired relaxation).  2. Right ventricular systolic function is normal. The right ventricular size is normal. There is normal pulmonary artery systolic  pressure. The estimated right ventricular systolic pressure is 25.3 mmHg.  3. The mitral valve is normal in structure. Trivial mitral valve regurgitation. No evidence of mitral stenosis.  4. The aortic valve is tricuspid. There is mild calcification of the aortic valve. Aortic valve regurgitation is not visualized. No aortic stenosis is present.  5. The inferior vena cava is normal in size with greater than 50% respiratory variability, suggesting right atrial pressure of 3 mmHg. Comparison(s): No significant change from prior study. Prior images reviewed side by side. FINDINGS  Left Ventricle: Left ventricular ejection fraction, by estimation, is 60 to 65%. The left ventricle has normal function. The left ventricle has no regional wall motion abnormalities. The left ventricular internal cavity size was normal in size. There is  no left ventricular hypertrophy. Left ventricular diastolic parameters are consistent with Grade I diastolic dysfunction (impaired relaxation). Right Ventricle: The right  ventricular size is normal. No increase in right ventricular wall thickness. Right ventricular systolic function is normal. There is normal pulmonary artery systolic pressure. The tricuspid regurgitant velocity is 2.36 m/s, and  with an assumed right atrial pressure of 3 mmHg, the estimated right ventricular systolic pressure is 25.3 mmHg. Left Atrium: Left atrial size was normal in size. Right Atrium: Right atrial size was normal in size. Pericardium: There is no evidence of pericardial effusion. Mitral Valve: The mitral valve is normal in structure. Trivial mitral valve regurgitation. No evidence of mitral valve stenosis. Tricuspid Valve: The tricuspid valve is normal in structure. Tricuspid valve regurgitation is trivial. No evidence of tricuspid stenosis. Aortic Valve: The aortic valve is tricuspid. There is mild calcification of the aortic valve. Aortic valve regurgitation is not visualized. No aortic stenosis is present. Aortic valve mean gradient measures 4.0 mmHg. Aortic valve peak gradient measures 5.9 mmHg. Aortic valve area, by VTI measures 2.32 cm. Pulmonic Valve: The pulmonic valve was normal in structure. Pulmonic valve regurgitation is not visualized. No evidence of pulmonic stenosis. Aorta: The aortic root is normal in size and structure. Venous: The inferior vena cava is normal in size with greater than 50% respiratory variability, suggesting right atrial pressure of 3 mmHg. IAS/Shunts: No atrial level shunt detected by color flow Doppler.  LEFT VENTRICLE PLAX 2D LVIDd:         4.80 cm LVIDs:         3.50 cm LV PW:         1.10 cm LV IVS:        0.80 cm LVOT diam:     2.10 cm LV SV:         56 LV SV Index:   30 LVOT Area:     3.46 cm  LV Volumes (MOD) LV vol d, MOD A2C: 122.0 ml LV vol d, MOD A4C: 88.1 ml LV vol s, MOD A2C: 52.8 ml LV vol s, MOD A4C: 36.3 ml LV SV MOD A2C:     69.2 ml LV SV MOD A4C:     88.1 ml LV SV MOD BP:      60.2 ml RIGHT VENTRICLE RV S prime:     12.00 cm/s TAPSE (M-mode):  1.7 cm LEFT ATRIUM             Index        RIGHT ATRIUM           Index LA diam:        3.80 cm 2.01 cm/m   RA Area:     10.70 cm LA  Vol [facility]):   57.7 ml 30.51 ml/m  RA Volume:   17.00 ml  8.99 ml/m LA Vol (A4C):   44.7 ml 23.64 ml/m LA Biplane Vol: 54.0 ml 28.55 ml/m  AORTIC VALVE AV Area (Vmax):    2.37 cm AV Area (Vmean):   2.04 cm AV Area (VTI):     2.32 cm AV Vmax:           121.00 cm/s AV Vmean:          91.600 cm/s AV VTI:            0.242 m AV Peak Grad:      5.9 mmHg AV Mean Grad:      4.0 mmHg LVOT Vmax:         82.90 cm/s LVOT Vmean:        53.900 cm/s LVOT VTI:          0.162 m LVOT/AV VTI ratio: 0.67  AORTA Ao Root diam: 3.00 cm Ao Asc diam:  3.50 cm MITRAL VALVE                TRICUSPID VALVE MV Area (PHT): 3.28 cm     TR Peak grad:   22.3 mmHg MV E velocity: 65.10 cm/s   TR Vmax:        236.00 cm/s MV A velocity: 104.00 cm/s MV E/A ratio:  0.63         SHUNTS                             Systemic VTI:  0.16 m                             Systemic Diam: 2.10 cm Oneil Parchment MD Electronically signed by Oneil Parchment MD Signature Date/Time: 10/25/2024/4:55:56 PM    Final    DG CHEST PORT 1 VIEW Result Date: 10/24/2024 CLINICAL DATA:  Cough, shortness of breath EXAM: PORTABLE CHEST 1 VIEW COMPARISON:  October 17, 2024 FINDINGS: The heart size and mediastinal contours are within normal limits. Elevated right hemidiaphragm is noted. Lungs are clear. The visualized skeletal structures are unremarkable. IMPRESSION: No active disease. Electronically Signed   By: Lynwood Landy Raddle M.D.   On: 10/24/2024 10:43   CT FOOT RIGHT WO CONTRAST Result Date: 10/20/2024 CLINICAL DATA:  Foot pain.  Recent fall. EXAM: CT OF THE RIGHT FOOT WITHOUT CONTRAST TECHNIQUE: Multidetector CT imaging of the right foot was performed according to the standard protocol. Multiplanar CT image reconstructions were also generated. RADIATION DOSE REDUCTION: This exam was performed according to the departmental dose-optimization  program which includes automated exposure control, adjustment of the mA and/or kV according to patient size and/or use of iterative reconstruction technique. COMPARISON:  Right foot radiographs dated 10/19/2024. FINDINGS: Bones/Joint/Cartilage Diffuse osseous demineralization. Postsurgical changes at the base of the first metatarsal with 2 fractured cannulated partially threaded screws, one of which demonstrates up to 5 mm of displacement (series 5, images 101-103). No appreciable acute fracture involving the visualized bones. No dislocation. Well corticated ossicle at the dorsal aspect of the Lisfranc joint. Mild-to-moderate osteoarthritis throughout the hindfoot and midfoot, most pronounced at the calcaneocuboid, talonavicular, first and second TMT joints. Hallux valgus with mild-to-moderate degenerative changes of the first MTP joint. Moderate degenerative changes at the first metatarsal head-hallux sesamoid articulation. Plantar calcaneal spur. Ligaments Ligaments are suboptimally evaluated by CT. Muscles and Tendons  Thickening of the distal Achilles tendon, which may reflect tendinosis. Foci of calcification within the distal Achilles tendon may relate to prior trauma. Generalized atrophy of the musculature. Soft tissue No fluid collection or hematoma. IMPRESSION: 1. Postsurgical changes at the base of the first metatarsal with 2 fractured screws, one of which demonstrates up to 5 mm of displacement. 2. No appreciable acute fracture involving the visualized bones. No dislocation. 3. Hallux valgus with mild-to-moderate degenerative changes of the first MTP joint. 4. Moderate degenerative changes at the first metatarsal head-hallux sesamoid articulation. 5. Mild-to-moderate osteoarthritis throughout the hindfoot and midfoot. Well corticated ossicle at the dorsal aspect of the Lisfranc joint. 6. Thickening of the distal Achilles tendon may reflect tendinosis. Foci of calcification within the distal Achilles tendon  may relate to prior trauma. Electronically Signed   By: Harrietta Sherry M.D.   On: 10/20/2024 14:10   DG Foot 2 Views Right Result Date: 10/19/2024 CLINICAL DATA:  Right foot pain. EXAM: RIGHT FOOT - 2 VIEW COMPARISON:  None Available. FINDINGS: Postsurgical change in the right proximal metatarsal. One of the screws is broken. The other screw is also likely broken and displaced with surrounding lucency. Hallux valgus with degenerative change of the first metatarsal phalangeal joint. Mild hammertoe deformity of the toes. Moderate midfoot osteoarthritis. Moderate plantar calcaneal spur. No evidence of acute fracture. Mild generalized soft tissue edema. IMPRESSION: 1. Postsurgical change in the right proximal metatarsal. One of the screws is broken. The other screw is also likely broken and displaced with surrounding lucency. 2. Hallux valgus with degenerative change of the first metatarsophalangeal joint. 3. Moderate midfoot osteoarthritis. Electronically Signed   By: Andrea Gasman M.D.   On: 10/19/2024 16:40   CT ABDOMEN PELVIS W CONTRAST Result Date: 10/17/2024 EXAM: CT ABDOMEN AND PELVIS WITH CONTRAST 10/17/2024 09:18:11 AM TECHNIQUE: CT of the abdomen and pelvis was performed with the administration of 80 mL of iohexol  (OMNIPAQUE ) 300 MG/ML solution. Multiplanar reformatted images are provided for review. Automated exposure control, iterative reconstruction, and/or weight-based adjustment of the mA/kV was utilized to reduce the radiation dose to as low as reasonably achievable. COMPARISON: CT abdomen and pelvis 09/02/2024. CLINICAL HISTORY: 79 year old female with dizziness, fall in bathroom, abdominal pain, and diarrhea. FINDINGS: LOWER CHEST: Chronic elevation of the right hemidiaphragm is stable. Calcified coronary artery atherosclerosis. Coarsely calcified benign appearing right breast mass on series 2 image 5 (such as chronic fat necrosis), appears stable from previous CT abdomen and pelvis in  June this year. No pericardial effusion. Stable lung base atelectasis. LIVER: The liver is unremarkable. GALLBLADDER AND BILE DUCTS: Chronic cholecystectomy. No biliary ductal dilatation. SPLEEN: No acute abnormality. PANCREAS: Mostly atrophied pancreas. ADRENAL GLANDS: No acute abnormality. KIDNEYS, URETERS AND BLADDER: No stones in the kidneys or ureters. No hydronephrosis. No perinephric or periureteral stranding. Urinary bladder is unremarkable. Absent renal contrast excretion, or little to no renal contrast excretion on the delayed images, raising the possibility of acute intrinsic renal insufficiency. GI AND BOWEL: Retained fluid in the stomach. Chronically redundant large bowel with severe diverticulosis throughout the descending and sigmoid colon. There is circumferential wall thickening and active inflammation affecting the transverse colon and splenic flexure diffusely (coronal image 54 and series 2 image 28) with mild regional mesenteric inflammation. The wall thickening and inflammation abates in the sigmoid colon. The hepatic flexure and right colon are relatively spared. Decompressed small bowel loops with flocculated material in the terminal ileum. Appendix not well delineated but no evidence of acute inflammation at  the cecum. There is no bowel obstruction. A small fat-containing umbilical hernia is stable. PERITONEUM AND RETROPERITONEUM: No ascites. No free air. VASCULATURE: Large arterial and portal venous structures in the abdomen and pelvis appear patent. Calcified aortic atherosclerosis with mild tortuosity. LYMPH NODES: No lymphadenopathy. REPRODUCTIVE ORGANS: Chronically absent uterus, diminutive or absent ovaries. BONES AND SOFT TISSUES: Chronic right flank generator device with no attached electrical lead identified now. Chronic lumbar fusion changes and severe superimposed lower thoracic and lumbar spine degeneration. Associated moderate dextroconvex scoliosis and widespread vacuum disc. No  acute osseous abnormality. IMPRESSION: 1. Long segment acute colitis, most affecting the transverse colon and splenic flexure. No bowel obstruction, perforation, or abscess. Underlying highly redundant large bowel with extensive retained stool. 2. Little to no renal contrast excretion, raising the possibility of acute renal insufficiency. 3. No acute traumatic injury identified. Electronically signed by: Helayne Hurst MD 10/17/2024 09:37 AM EST RP Workstation: [id number]   CT Cervical Spine Wo Contrast Result Date: 10/17/2024 EXAM: CT CERVICAL SPINE WITHOUT CONTRAST 10/17/2024 09:18:11 AM TECHNIQUE: CT of the cervical spine was performed without the administration of intravenous contrast. Multiplanar reformatted images are provided for review. Automated exposure control, iterative reconstruction, and/or weight based adjustment of the mA/kV was utilized to reduce the radiation dose to as low as reasonably achievable. COMPARISON: CT head reported separately today and cervical spine CT 06/25/2024. CLINICAL HISTORY: Patient is a 79 year old female. Neck trauma, dizziness, fall in bathroom, left orbital abrasion. FINDINGS: BONES AND ALIGNMENT: Stable cervical vertebral alignment. No acute fracture or traumatic malalignment. Chronic C3-C6 ACDF appears stable. DEGENERATIVE CHANGES: Chronic C3-C6 ACDF appears stable. Chronic facet arthropathy at the C2-C3 adjacent segment. Advanced chronic disc and facet degeneration at the C6-C7 adjacent segment, including vacuum disc. SOFT TISSUES: Bulky chronic right cervical carotid calcified atherosclerosis. Stable non-contrast visible neck soft tissues. Stable visible non-contrast thoracic inlet. LIMITATIONS/ARTIFACTS: Mild motion artifact. IMPRESSION: 1. Mildly motion degraded, no acute traumatic injury identified in the cervical spine. 2. Chronic C3-C6 ACDF, with advanced adjacent segment degeneration. Electronically signed by: Helayne Hurst MD 10/17/2024 09:29 AM EST RP  Workstation: [id number]   CT Head Wo Contrast Result Date: 10/17/2024 EXAM: CT HEAD WITHOUT CONTRAST 10/17/2024 09:18:11 AM TECHNIQUE: CT of the head was performed without the administration of intravenous contrast. Automated exposure control, iterative reconstruction, and/or weight-based adjustment of the mA/kV was utilized to reduce the radiation dose to as low as reasonably achievable. COMPARISON: CT cervical spine reported separately today. CT head 06/25/2024. CLINICAL HISTORY: 79 year old female with minor head trauma, dizziness, fall in bathroom, and left orbital abrasion. FINDINGS: BRAIN AND VENTRICLES: No acute hemorrhage. No evidence of acute infarct. No hydrocephalus. No extra-axial collection. No mass effect or midline shift. Stable cerebral volume. Chronically advanced bilateral cerebral white Courtney [name] hypodensity and chronic lacunar infarct of the right corona radiata and medial lentiform. Stable gray white differentiation. Hyperdensity. ORBITS: Mild soft tissue swelling and stranding lateral to the left orbit, overlying the left zygoma (series 506 image 3) which appears to remain intact. No soft tissue gas. Orbital soft tissues otherwise stable. SINUSES: Visible paranasal sinuses, tympanic cavities and mastoids are clear. SOFT TISSUES AND SKULL: Mild soft tissue swelling and stranding lateral to the left orbit, overlying the left zygoma (series 506 image 3) which appears to remain intact. No soft tissue gas. No skull fracture. Calcified atherosclerosis at the skull base. IMPRESSION: 1. Left periorbital soft tissue injury. 2. No acute intracranial abnormality. Stable non-contrast CT appearance of chronic small vessel disease. Electronically signed by: Helayne  Hall MD 10/17/2024 09:26 AM EST RP Workstation: [id number]   DG Hand Complete Left Result Date: 10/17/2024 CLINICAL DATA:  Fall.  Left hand pain and bruising. EXAM: LEFT HAND - COMPLETE 3+ VIEW COMPARISON:  None Available. FINDINGS: There is  no evidence of fracture or dislocation. Osteoarthritis is seen involving the 2nd and 3rd DIP joints and interphalangeal joint of the thumb. Moderate to severe osteoarthritis is also seen involving the base of the thumb, STT joint complex, and radiocarpal joint. IMPRESSION: No acute findings. Osteoarthritis, as described above. Electronically Signed   By: Norleen DELENA Kil M.D.   On: 10/17/2024 09:24   DG Tibia/Fibula Right Result Date: 10/17/2024 CLINICAL DATA:  Fall.  Right leg injury and pain. EXAM: RIGHT TIBIA AND FIBULA - 2 VIEW COMPARISON:  None Available. FINDINGS: There is no evidence of fracture or other focal bone lesions. Prior knee arthroplasty noted. Peripheral vascular calcification noted. IMPRESSION: No acute findings. Electronically Signed   By: Norleen DELENA Kil M.D.   On: 10/17/2024 09:06   DG Chest Port 1 View Result Date: 10/17/2024 CLINICAL DATA:  Dizziness.  Fall. EXAM: PORTABLE CHEST 1 VIEW COMPARISON:  04/13/2024 FINDINGS: The heart size and mediastinal contours are within normal limits. Low lung volumes again noted. No evidence of pneumothorax or hemothorax. Both lungs are clear. Cervical spine fusion hardware again noted. IMPRESSION: Low lung volumes. No acute findings. Electronically Signed   By: Norleen DELENA Kil M.D.   On: 10/17/2024 09:05    Microbiology: Recent Results (from the past 240 hours)  Resp panel by RT-PCR (RSV, Flu A&B, Covid) Anterior Nasal Swab     Status: None   Collection Time: 10/17/24  8:04 AM   Specimen: Anterior Nasal Swab  Result Value Ref Range Status   SARS Coronavirus 2 by RT PCR NEGATIVE NEGATIVE Final    Comment: (NOTE) SARS-CoV-2 target nucleic acids are NOT DETECTED.  The SARS-CoV-2 RNA is generally detectable in upper respiratory specimens during the acute phase of infection. The lowest concentration of SARS-CoV-2 viral copies this assay can detect is 138 copies/mL. A negative result does not preclude SARS-Cov-2 infection and should not be used as  the sole basis for treatment or other patient management decisions. A negative result may occur with  improper specimen collection/handling, submission of specimen other than nasopharyngeal swab, presence of viral mutation(s) within the areas targeted by this assay, and inadequate number of viral copies(<138 copies/mL). A negative result must be combined with clinical observations, patient history, and epidemiological information. The expected result is Negative.  Fact Sheet for Patients:  bloggercourse.com  Fact Sheet for Healthcare Providers:  seriousbroker.it  This test is no t yet approved or cleared by the [place]  FDA and  has been authorized for detection and/or diagnosis of SARS-CoV-2 by FDA under an Emergency Use Authorization (EUA). This EUA will remain  in effect (meaning this test can be used) for the duration of the COVID-19 declaration under Section 564(b)(1) of the Act, 21 U.S.C.section 360bbb-3(b)(1), unless the authorization is terminated  or revoked sooner.       Influenza A by PCR NEGATIVE NEGATIVE Final   Influenza B by PCR NEGATIVE NEGATIVE Final    Comment: (NOTE) The Xpert Xpress SARS-CoV-2/FLU/RSV plus assay is intended as an aid in the diagnosis of influenza from Nasopharyngeal swab specimens and should not be used as a sole basis for treatment. Nasal washings and aspirates are unacceptable for Xpert Xpress SARS-CoV-2/FLU/RSV testing.  Fact Sheet for Patients: bloggercourse.com  Fact Sheet for Healthcare  Providers: seriousbroker.it  This test is not yet approved or cleared by the [place]  FDA and has been authorized for detection and/or diagnosis of SARS-CoV-2 by FDA under an Emergency Use Authorization (EUA). This EUA will remain in effect (meaning this test can be used) for the duration of the COVID-19 declaration under Section 564(b)(1) of  the Act, 21 U.S.C. section 360bbb-3(b)(1), unless the authorization is terminated or revoked.     Resp Syncytial Virus by PCR NEGATIVE NEGATIVE Final    Comment: (NOTE) Fact Sheet for Patients: bloggercourse.com  Fact Sheet for Healthcare Providers: seriousbroker.it  This test is not yet approved or cleared by the [place]  FDA and has been authorized for detection and/or diagnosis of SARS-CoV-2 by FDA under an Emergency Use Authorization (EUA). This EUA will remain in effect (meaning this test can be used) for the duration of the COVID-19 declaration under Section 564(b)(1) of the Act, 21 U.S.C. section 360bbb-3(b)(1), unless the authorization is terminated or revoked.  Performed at [facility], [address] [place], [place] [address]   Blood Culture (routine x 2)     Status: None   Collection Time: 10/17/24  8:48 AM   Specimen: BLOOD  Result Value Ref Range Status   Specimen Description   Final    BLOOD BLOOD RIGHT FOREARM Performed at [facility], [address], [place], [place] [address]    Special Requests   Final    BOTTLES DRAWN AEROBIC AND ANAEROBIC Blood Culture results may not be optimal due to an inadequate volume of blood received in culture bottles Performed at [facility], [address] [place], [place] [address]    Culture   Final    NO GROWTH 5 DAYS Performed at [facility], [address] [place], [place] [address]    Report Status 10/22/2024 FINAL  Final  Blood Culture (routine x 2)     Status: None   Collection Time: 10/17/24  8:56 AM   Specimen: BLOOD  Result Value Ref Range Status   Specimen Description   Final    BLOOD RIGHT ANTECUBITAL Performed at [facility], [address] [place], [place] [address]    Special Requests   Final    BOTTLES DRAWN AEROBIC AND ANAEROBIC Blood Culture adequate  volume Performed at [facility], [address], [place], [place] [address]    Culture   Final    NO GROWTH 5 DAYS Performed at [facility], [address], [place], [place] [address]    Report Status 10/22/2024 FINAL  Final  Urine Culture (for pregnant, neutropenic or urologic patients or patients with an indwelling urinary catheter)     Status: Abnormal   Collection Time: 10/17/24  9:25 AM   Specimen: Urine, Clean Catch  Result Value Ref Range Status   Specimen Description   Final    URINE, CLEAN CATCH Performed at [facility], [address] [place], [place] [address]    Special Requests   Final    NONE Performed at [facility], [address] [place], [place] [address]    Culture (A)  Final    30,000 COLONIES/mL PSEUDOMONAS AERUGINOSA 30,000 COLONIES/mL ENTEROCOCCUS FAECIUM    Report Status 10/21/2024 FINAL  Final   Organism ID, Bacteria PSEUDOMONAS AERUGINOSA (A)  Final   Organism ID, Bacteria ENTEROCOCCUS FAECIUM (A)  Final      Susceptibility   Enterococcus faecium -  MIC*    AMPICILLIN  <=2 SENSITIVE Sensitive     NITROFURANTOIN  64 INTERMEDIATE Intermediate     VANCOMYCIN  <=0.5 SENSITIVE Sensitive     * 30,000 COLONIES/mL ENTEROCOCCUS FAECIUM   Pseudomonas aeruginosa - MIC*    MEROPENEM <=0.25 SENSITIVE Sensitive     CIPROFLOXACIN  0.5 SENSITIVE Sensitive     IMIPENEM 1 SENSITIVE Sensitive     PIP/TAZO Value in next row Sensitive      8 SENSITIVEThis is a modified FDA-approved test that has been validated and its performance characteristics determined by the reporting laboratory.  This laboratory is certified under the Clinical Laboratory Improvement Amendments CLIA as qualified to perform high complexity clinical laboratory testing.    CEFTAZIDIME/AVIBACTAM Value in next row Sensitive      8 SENSITIVEThis is a modified FDA-approved test that has been validated and its performance characteristics  determined by the reporting laboratory.  This laboratory is certified under the Clinical Laboratory Improvement Amendments CLIA as qualified to perform high complexity clinical laboratory testing.    CEFTOLOZANE/TAZOBACTAM Value in next row Sensitive      8 SENSITIVEThis is a modified FDA-approved test that has been validated and its performance characteristics determined by the reporting laboratory.  This laboratory is certified under the Clinical Laboratory Improvement Amendments CLIA as qualified to perform high complexity clinical laboratory testing.    TOBRAMYCIN Value in next row Sensitive      8 SENSITIVEThis is a modified FDA-approved test that has been validated and its performance characteristics determined by the reporting laboratory.  This laboratory is certified under the Clinical Laboratory Improvement Amendments CLIA as qualified to perform high complexity clinical laboratory testing.    CEFTAZIDIME Value in next row Sensitive      8 SENSITIVEThis is a modified FDA-approved test that has been validated and its performance characteristics determined by the reporting laboratory.  This laboratory is certified under the Clinical Laboratory Improvement Amendments CLIA as qualified to perform high complexity clinical laboratory testing.    * 30,000 COLONIES/mL PSEUDOMONAS AERUGINOSA  C Difficile Quick Screen w PCR reflex     Status: None   Collection Time: 10/18/24 12:47 PM   Specimen: STOOL  Result Value Ref Range Status   C Diff antigen NEGATIVE NEGATIVE Final   C Diff toxin NEGATIVE NEGATIVE Final   C Diff interpretation No C. difficile detected.  Final    Comment: Performed at [facility], [address], [place], [place] [address]  Gastrointestinal Panel by PCR , Stool     Status: Abnormal   Collection Time: 10/18/24 12:47 PM   Specimen: STOOL  Result Value Ref Range Status   Campylobacter species NOT DETECTED NOT DETECTED Final   Plesimonas shigelloides NOT  DETECTED NOT DETECTED Final   Salmonella species NOT DETECTED NOT DETECTED Final   Yersinia enterocolitica NOT DETECTED NOT DETECTED Final   Vibrio species NOT DETECTED NOT DETECTED Final   Vibrio cholerae NOT DETECTED NOT DETECTED Final   Enteroaggregative E coli (EAEC) NOT DETECTED NOT DETECTED Final   Enteropathogenic E coli (EPEC) NOT DETECTED NOT DETECTED Final   Enterotoxigenic E coli (ETEC) DETECTED (A) NOT DETECTED Final    Comment: RESULT CALLED TO, READ BACK BY AND VERIFIED WITH: HILLARY MASHBURN 10/20/24 1219 MU    Shiga like toxin producing E coli (STEC) NOT DETECTED NOT DETECTED Final   Shigella/Enteroinvasive E coli (EIEC) NOT DETECTED NOT DETECTED Final   Cryptosporidium NOT DETECTED NOT DETECTED Final   Cyclospora cayetanensis NOT DETECTED NOT DETECTED  Final   Entamoeba histolytica NOT DETECTED NOT DETECTED Final   Giardia lamblia NOT DETECTED NOT DETECTED Final   Adenovirus F40/41 NOT DETECTED NOT DETECTED Final   Astrovirus NOT DETECTED NOT DETECTED Final   Norovirus GI/GII NOT DETECTED NOT DETECTED Final   Rotavirus A NOT DETECTED NOT DETECTED Final   Sapovirus (I, II, IV, and V) NOT DETECTED NOT DETECTED Final    Comment: Performed at [facility], [address] [place], [place] [address]     Labs: Basic Metabolic Panel: Recent Labs  Lab 10/21/24 0436 10/22/24 0351 10/23/24 0422 10/25/24 0400 10/26/24 0420  NA 137 137 140 139 140  K 3.6 3.8 3.5 4.1 4.0  CL 100 97* 99 94* 93*  CO2 29 30 35* 38* 40*  GLUCOSE 87 145* 91 92 100*  BUN 6* <5* 14 11 12   CREATININE 0.58 0.51 0.64 0.69 0.78  CALCIUM  8.9 9.5 9.3 9.9 9.9  MG 2.0 1.9 1.9 1.8 1.8   Liver Function Tests: No results for input(s): AST, ALT, ALKPHOS, BILITOT, PROT, ALBUMIN in the last 168 hours. No results for input(s): LIPASE, AMYLASE in the last 168 hours. No results for input(s): AMMONIA in the last 168 hours. CBC: Recent Labs  Lab 10/20/24 0431 10/21/24 0436  10/22/24 0351  WBC 10.4 9.6 7.9  HGB 10.2* 10.0* 11.9*  HCT 32.6* 31.3* 37.2  MCV 95.0 93.2 92.8  PLT 163 147* 239   Cardiac Enzymes: No results for input(s): CKTOTAL, CKMB, CKMBINDEX, TROPONINI in the last 168 hours. BNP: BNP (last 3 results) No results for input(s): BNP in the last 8760 hours.  ProBNP (last 3 results) No results for input(s): PROBNP in the last 8760 hours.  CBG: No results for input(s): GLUCAP in the last 168 hours.     Signed:  Toribio Hummer MD.  [facility] 10/26/2024, 1:41 PM       [1]  Allergies Allergen Reactions   Tetanus Toxoid-Containing Vaccines Anaphylaxis   Tetanus-Diphtheria Toxoids Td Anaphylaxis   Meloxicam Nausea And Vomiting   Phentermine Nausea And Vomiting   Sulfa Antibiotics Diarrhea and Nausea And Vomiting   Zithromax [Azithromycin] Diarrhea and Nausea And Vomiting

## 2024-10-26 NOTE — TOC Transition Note (Signed)
 Transition of Care [facility]) - Discharge Note   Patient Details  Name: Courtney [name] MRN: [id number] Date of Birth: October 10, 1946  [facility] CM/SW Contact:  Tawni CHRISTELLA Eva, LCSW Phone Number: 10/26/2024, 9:50 AM   Clinical Narrative:     Pt to d/c to [facility] for SNF placement. Pt's room 402, RN to call report to [phone], PTAR called , no further ICM needs, ICM sign off.   Final next level of care: Skilled Nursing Facility Barriers to Discharge: Barriers Resolved   Patient Goals and CMS Choice Patient states their goals for this hospitalization and ongoing recovery are:: SNf to get stonger          Discharge Placement              Patient chooses bed at: [facility] Patient to be transferred to facility by: EMS Name of family member notified: Spouse, Emergency Contact  [phone] (Mobile Patient and family notified of of transfer: 10/26/24  Discharge Plan and Services Additional resources added to the After Visit Summary for   In-house Referral: Clinical Social Work                                   Social Drivers of Health (SDOH) Interventions SDOH Screenings   Food Insecurity: No Food Insecurity (10/19/2024)  Housing: Low Risk (10/19/2024)  Transportation Needs: No Transportation Needs (10/19/2024)  Utilities: Not At Risk (10/19/2024)  Social Connections: Moderately Integrated (10/19/2024)  Tobacco Use: Low Risk (10/17/2024)     Readmission Risk Interventions    10/21/2024   12:32 PM 04/13/2024    3:55 PM  Readmission Risk Prevention Plan  Transportation Screening Complete Complete  PCP or Specialist Appt within 5-7 Days  Complete  Home Care Screening  Complete  Medication Review (RN CM)  Complete  HRI or Home Care Consult Complete   Social Work Consult for Recovery Care Planning/Counseling Complete   Palliative Care Screening Not Applicable   Medication Review Oceanographer) Complete

## 2024-10-26 NOTE — Care Management Important Message (Signed)
 Important Message  Patient Details IM Letter given. Name: Courtney [name] MRN: [id number] Date of Birth: Mar 30, 1946   Important Message Given:  Yes - Medicare IM     Melba Ates 10/26/2024, 3:23 PM

## 2024-11-10 ENCOUNTER — Ambulatory Visit: Admitting: Cardiology

## 2024-11-15 ENCOUNTER — Other Ambulatory Visit (HOSPITAL_BASED_OUTPATIENT_CLINIC_OR_DEPARTMENT_OTHER)

## 2024-11-15 NOTE — Progress Notes (Unsigned)
 " Cardiology Office Note   Date: 11/16/2024  ID:  Courtney [name] 1945-12-15 [id number] PCP: Ransom Other, MD  [facility] Providers Cardiologist: Newman JINNY Lawrence, MD     Chief Complaint: Courtney [name] is a 79 y.o.female with PMH of CAD, diastolic dysfunction, PVCs, hypertension, CVA, OSA, asthma who presents to the clinic for post-hospital follow-up.    Courtney [name] had an echo during an admission in 12/2021 that showed LVEF 60-65%, G1DD, trivial MR. Stress test showed a small fixed apical defect.   She was seen by Dr. Lawrence 09/2024 for evaluation of an irregular heartbeat noted by her PCP. Her EKG did have occasional PVCs. Her hypertension was managed with verapamil , and this was felt to be a good choice given her PVCs and was continued. She had LE edema and an echocardiogram was ordered but not yet completed.  She unfortunately presented to the ER 10/17/2024 after a fall at her assisted living facility. She was found to have sepsis in the setting of colitis and a UTI. She also had an AKI. Expiratory wheezing was noted on exam, she was given one dose of Lasix  40 mg IV. She became hypoxic and had crackles on exam. Echo 10/25/2024 showed LVEF 60-65%, G1DD, trivial MR. She was diuresed with IV Lasix  further and her symptoms improved. Cardiology was not consulted during this admission. She was discharged on 10/26/2024 with daily Lasix . She was recommended to follow-up with cardiology after discharge.    History of Present Illness: Today she is accompanied by her husband and is doing well from a cardiac standpoint. She is currently residing in the medical care portion of their independent living facility and is eager to get back. PT and OT is increasing to 5 days per week. She is thankful that her LE edema has improved with Lasix . She has never really had dyspnea but does have chest pain when she pulls herself up in the bed. The facility monitors her weight monthly. She does have  dizziness upon changing positions. The facility is ensuring that she is staying hydrated with water. She has occasional palpitations that are not bothersome. She wears her CPAP every night. She does not drink caffeine or alcohol , but she does eat a lot of chocolate.  ROS: Denies orthopnea, PND, syncope, abnormal bleeding.   Studies Reviewed: The following studies were reviewed today: Cardiac Studies & Procedures   ______________________________________________________________________________________________   STRESS TESTS  NM MYOCAR MULTI W/SPECT W 01/03/2022  Narrative CLINICAL DATA:  Chest pain.  79 year old female.  EXAM: MYOCARDIAL IMAGING WITH SPECT (REST AND PHARMACOLOGIC-STRESS)  GATED LEFT VENTRICULAR WALL MOTION STUDY  LEFT VENTRICULAR EJECTION FRACTION  TECHNIQUE: Standard myocardial SPECT imaging was performed after resting intravenous injection of 10 mCi Tc-43m tetrofosmin . Subsequently, intravenous infusion of Lexiscan  was performed under the supervision of the Cardiology staff. At peak effect of the drug, 30 mCi Tc-9m tetrofosmin  was injected intravenously and standard myocardial SPECT imaging was performed. Quantitative gated imaging was also performed to evaluate left ventricular wall motion, and estimate left ventricular ejection fraction.  COMPARISON:  None.  FINDINGS: Perfusion: No decreased activity in the left ventricle on stress imaging to suggest reversible ischemia. Small fixed defect in the apical segment of the inferolateral wall.  Wall Motion: Normal left ventricular wall motion. No left ventricular dilation.  Left Ventricular Ejection Fraction: 71 %  End diastolic volume 76 ml  End systolic volume 22 ml  IMPRESSION: 1. No reversible ischemia. Small fixed defect in the apical segment of  the inferolateral wall suggesting small infarction.  2. Normal left ventricular wall motion.  3. Left ventricular ejection fraction 71%  4. Non  invasive risk stratification*: Low  *2012 Appropriate Use Criteria for Coronary Revascularization Focused Update: J Am Coll Cardiol. 2012;59(9):857-881. http://content.dementiazones.com.aspx?articleid=1201161   Electronically Signed By: Jackquline Boxer M.D. On: 01/03/2022 14:52   ECHOCARDIOGRAM  ECHOCARDIOGRAM COMPLETE 10/25/2024  Narrative ECHOCARDIOGRAM REPORT    Patient Name:   Courtney [name] Date of Exam: 10/25/2024 Medical Rec #:  [id number]       Height:       62.0 in Accession #:    [id number]      Weight:       195.0 lb Date of Birth:  [birth year]       BSA:          1.891 m Patient Age:    78 years        BP:           113/82 mmHg Patient Gender: F               HR:           75 bpm. Exam Location:  Inpatient  Procedure: 2D Echo, Cardiac Doppler and Color Doppler (Both Spectral and Color Flow Doppler were utilized during procedure).  Indications:    CHF-Acute Diastolic I50.31  History:        Patient has prior history of Echocardiogram examinations, most recent 01/02/2022. Stroke; Risk Factors:Hypertension.  Sonographer:    Sydnee Wilson RDCS Referring Phys: [id number] DANIEL V THOMPSON  IMPRESSIONS   1. Left ventricular ejection fraction, by estimation, is 60 to 65%. The left ventricle has normal function. The left ventricle has no regional wall motion abnormalities. Left ventricular diastolic parameters are consistent with Grade I diastolic dysfunction (impaired relaxation). 2. Right ventricular systolic function is normal. The right ventricular size is normal. There is normal pulmonary artery systolic pressure. The estimated right ventricular systolic pressure is 25.3 mmHg. 3. The mitral valve is normal in structure. Trivial mitral valve regurgitation. No evidence of mitral stenosis. 4. The aortic valve is tricuspid. There is mild calcification of the aortic valve. Aortic valve regurgitation is not visualized. No aortic stenosis is present. 5. The inferior vena  cava is normal in size with greater than 50% respiratory variability, suggesting right atrial pressure of 3 mmHg.  Comparison(s): No significant change from prior study. Prior images reviewed side by side.  FINDINGS Left Ventricle: Left ventricular ejection fraction, by estimation, is 60 to 65%. The left ventricle has normal function. The left ventricle has no regional wall motion abnormalities. The left ventricular internal cavity size was normal in size. There is no left ventricular hypertrophy. Left ventricular diastolic parameters are consistent with Grade I diastolic dysfunction (impaired relaxation).  Right Ventricle: The right ventricular size is normal. No increase in right ventricular wall thickness. Right ventricular systolic function is normal. There is normal pulmonary artery systolic pressure. The tricuspid regurgitant velocity is 2.36 m/s, and with an assumed right atrial pressure of 3 mmHg, the estimated right ventricular systolic pressure is 25.3 mmHg.  Left Atrium: Left atrial size was normal in size.  Right Atrium: Right atrial size was normal in size.  Pericardium: There is no evidence of pericardial effusion.  Mitral Valve: The mitral valve is normal in structure. Trivial mitral valve regurgitation. No evidence of mitral valve stenosis.  Tricuspid Valve: The tricuspid valve is normal in structure. Tricuspid valve regurgitation is trivial. No evidence of tricuspid  stenosis.  Aortic Valve: The aortic valve is tricuspid. There is mild calcification of the aortic valve. Aortic valve regurgitation is not visualized. No aortic stenosis is present. Aortic valve mean gradient measures 4.0 mmHg. Aortic valve peak gradient measures 5.9 mmHg. Aortic valve area, by VTI measures 2.32 cm.  Pulmonic Valve: The pulmonic valve was normal in structure. Pulmonic valve regurgitation is not visualized. No evidence of pulmonic stenosis.  Aorta: The aortic root is normal in size and  structure.  Venous: The inferior vena cava is normal in size with greater than 50% respiratory variability, suggesting right atrial pressure of 3 mmHg.  IAS/Shunts: No atrial level shunt detected by color flow Doppler.   LEFT VENTRICLE PLAX 2D LVIDd:         4.80 cm LVIDs:         3.50 cm LV PW:         1.10 cm LV IVS:        0.80 cm LVOT diam:     2.10 cm LV SV:         56 LV SV Index:   30 LVOT Area:     3.46 cm  LV Volumes (MOD) LV vol d, MOD A2C: 122.0 ml LV vol d, MOD A4C: 88.1 ml LV vol s, MOD A2C: 52.8 ml LV vol s, MOD A4C: 36.3 ml LV SV MOD A2C:     69.2 ml LV SV MOD A4C:     88.1 ml LV SV MOD BP:      60.2 ml  RIGHT VENTRICLE RV S prime:     12.00 cm/s TAPSE (M-mode): 1.7 cm  LEFT ATRIUM             Index        RIGHT ATRIUM           Index LA diam:        3.80 cm 2.01 cm/m   RA Area:     10.70 cm LA Vol (A2C):   57.7 ml 30.51 ml/m  RA Volume:   17.00 ml  8.99 ml/m LA Vol (A4C):   44.7 ml 23.64 ml/m LA Biplane Vol: 54.0 ml 28.55 ml/m AORTIC VALVE AV Area (Vmax):    2.37 cm AV Area (Vmean):   2.04 cm AV Area (VTI):     2.32 cm AV Vmax:           121.00 cm/s AV Vmean:          91.600 cm/s AV VTI:            0.242 m AV Peak Grad:      5.9 mmHg AV Mean Grad:      4.0 mmHg LVOT Vmax:         82.90 cm/s LVOT Vmean:        53.900 cm/s LVOT VTI:          0.162 m LVOT/AV VTI ratio: 0.67  AORTA Ao Root diam: 3.00 cm Ao Asc diam:  3.50 cm  MITRAL VALVE                TRICUSPID VALVE MV Area (PHT): 3.28 cm     TR Peak grad:   22.3 mmHg MV E velocity: 65.10 cm/s   TR Vmax:        236.00 cm/s MV A velocity: 104.00 cm/s MV E/A ratio:  0.63         SHUNTS Systemic VTI:  0.16 m Systemic Diam: 2.10 cm  [employer]  MD Electronically signed by Oneil Parchment MD Signature Date/Time: 10/25/2024/4:55:56 PM    Final          ______________________________________________________________________________________________                         Physical Exam: VS: BP 110/70 (BP Location: Left Arm, Patient Position: Sitting, Cuff Size: Large)   Pulse 76   Ht 5' 2 (1.575 m)   Wt 182 lb (82.6 kg)   SpO2 95%   BMI 33.29 kg/m   GEN: In to clinic in wheelchair in NAD HEENT: Normal NECK: No JVD CARDIAC: RRR, no murmurs, rubs, gallops RESPIRATORY: Clear to auscultation bilaterally ABDOMEN: Soft, non-tender, non-distended MUSCULOSKELETAL: No edema, boot on right ankle SKIN: Warm and dry NEUROLOGIC:  Alert and oriented x 3 PSYCHIATRIC:  Normal affect   Assessment & Plan: 1. Diastolic dysfunction: Noted G1DD on echo during admission 10/17/2024-10/25/2024 in the setting of sepsis and IV fluid resuscitation. She became hypoxic and was diuresed with improvement. She tells me that she never felt dyspneic but she did have LE edema that improved with Lasix . She is now having dizziness with position changes and her BP is 110/70 today. She had labs drawn at her facility this morning, so will defer repeat BMP today. She appears euvolemic on exam.  - Change Lasix  to daily as needed for weight increase of 3 pounds overnight or 5 pounds in one week - Recommend daily weights and limited salt/fluid intake  2. CAD: Small fixed apical defect on stress test 12/2021. She has chest pain when she pulls herself up in the bed, likely musculoskeletal in nature. Monitor for progressing anginal symptoms. No current indication for further ischemic evaluation today. - Continue aspirin  81 mg daily - Continue atorvastatin  80 mg daily  3. Hypertension: BP today 110/70. She has dizziness when changing positions.  - Change Lasix  to PRN as above  - Continue verapamil  360 mg daily - likely beneficial for palpitations as below  4. PVCs: Noted on EKG during last visit. She had been referred to cardiology by her PCP for irregular pulse. Maintaining sinus rhythm with no audible ectopy on exam today. She does not drink caffeine or alcohol , she wears CPAP every night, and  she tries to stay well-hydrated. She does eat a lot of chocolate. Palpitations not bothersome and infrequent at this time. - Recommend avoiding caffeine, alcohol , chocolate, dehydration - Continue compliance with CPAP  - Continue verapamil  360 mg daily  Dispo: Follow-up in 6 months with Dr. [place].  Signed, Saddie GORMAN Cleaves, NP 11/16/2024 4:17 PM [facility] "

## 2024-11-16 ENCOUNTER — Encounter: Payer: Self-pay | Admitting: Nurse Practitioner

## 2024-11-16 ENCOUNTER — Ambulatory Visit: Attending: Nurse Practitioner

## 2024-11-16 VITALS — BP 110/70 | HR 76 | Ht 62.0 in | Wt 182.0 lb

## 2024-11-16 DIAGNOSIS — I493 Ventricular premature depolarization: Secondary | ICD-10-CM | POA: Diagnosis not present

## 2024-11-16 DIAGNOSIS — I10 Essential (primary) hypertension: Secondary | ICD-10-CM | POA: Diagnosis not present

## 2024-11-16 DIAGNOSIS — I2510 Atherosclerotic heart disease of native coronary artery without angina pectoris: Secondary | ICD-10-CM | POA: Diagnosis not present

## 2024-11-16 DIAGNOSIS — I5189 Other ill-defined heart diseases: Secondary | ICD-10-CM | POA: Diagnosis not present

## 2024-11-16 NOTE — Patient Instructions (Addendum)
 Medication Instructions:  Furosemide  (Lasix ) 40 mg daily as needed for weight gain or swelling. Potassium 10 mEq daily as needed with Lasix   *If you need a refill on your cardiac medications before your next appointment, please call your pharmacy*  Lab Work: BMET today  Testing/Procedures: NONE ordered at this time of appointment   Follow-Up: At [facility], you and your health needs are our priority.  As part of our continuing mission to provide you with exceptional heart care, our providers are all part of one team.  This team includes your primary Cardiologist (physician) and Advanced Practice Providers or APPs (Physician Assistants and Nurse Practitioners) who all work together to provide you with the care you need, when you need it.  Your next appointment:   6 month(s)  Provider:   Dr. Katharyn  We recommend signing up for the patient portal called MyChart.  Sign up information is provided on this After Visit Summary.  MyChart is used to connect with patients for Virtual Visits (Telemedicine).  Patients are able to view lab/test results, encounter notes, upcoming appointments, etc.  Non-urgent messages can be sent to your provider as well.   To learn more about what you can do with MyChart, go to [email or web address].   Other Instructions
